# Patient Record
Sex: Male | Born: 1945 | Race: White | Hispanic: No | Marital: Married | Smoking: Current every day smoker
Health system: Southern US, Community
[De-identification: ages and names within clinical notes are randomized; demographics above are authoritative.]

## PROBLEM LIST (undated history)

## (undated) DIAGNOSIS — R918 Other nonspecific abnormal finding of lung field: Secondary | ICD-10-CM

## (undated) DIAGNOSIS — E785 Hyperlipidemia, unspecified: Secondary | ICD-10-CM

## (undated) DIAGNOSIS — R7989 Other specified abnormal findings of blood chemistry: Secondary | ICD-10-CM

## (undated) DIAGNOSIS — I739 Peripheral vascular disease, unspecified: Secondary | ICD-10-CM

## (undated) DIAGNOSIS — R0600 Dyspnea, unspecified: Secondary | ICD-10-CM

## (undated) DIAGNOSIS — R000 Tachycardia, unspecified: Secondary | ICD-10-CM

## (undated) DIAGNOSIS — I219 Acute myocardial infarction, unspecified: Secondary | ICD-10-CM

## (undated) DIAGNOSIS — I5032 Chronic diastolic (congestive) heart failure: Secondary | ICD-10-CM

## (undated) DIAGNOSIS — J449 Chronic obstructive pulmonary disease, unspecified: Secondary | ICD-10-CM

## (undated) DIAGNOSIS — R0602 Shortness of breath: Secondary | ICD-10-CM

## (undated) DIAGNOSIS — J439 Emphysema, unspecified: Secondary | ICD-10-CM

## (undated) DIAGNOSIS — D649 Anemia, unspecified: Secondary | ICD-10-CM

## (undated) DIAGNOSIS — I2510 Atherosclerotic heart disease of native coronary artery without angina pectoris: Secondary | ICD-10-CM

## (undated) DIAGNOSIS — I483 Typical atrial flutter: Secondary | ICD-10-CM

## (undated) DIAGNOSIS — R079 Chest pain, unspecified: Secondary | ICD-10-CM

## (undated) DIAGNOSIS — I10 Essential (primary) hypertension: Secondary | ICD-10-CM

## (undated) DIAGNOSIS — J9621 Acute and chronic respiratory failure with hypoxia: Secondary | ICD-10-CM

## (undated) DIAGNOSIS — I2583 Coronary atherosclerosis due to lipid rich plaque: Secondary | ICD-10-CM

## (undated) DIAGNOSIS — D509 Iron deficiency anemia, unspecified: Secondary | ICD-10-CM

## (undated) DIAGNOSIS — IMO0001 Reserved for inherently not codable concepts without codable children: Secondary | ICD-10-CM

## (undated) DIAGNOSIS — I4891 Unspecified atrial fibrillation: Secondary | ICD-10-CM

## (undated) DIAGNOSIS — J9611 Chronic respiratory failure with hypoxia: Secondary | ICD-10-CM

## (undated) DIAGNOSIS — J441 Chronic obstructive pulmonary disease with (acute) exacerbation: Secondary | ICD-10-CM

## (undated) DIAGNOSIS — J9690 Respiratory failure, unspecified, unspecified whether with hypoxia or hypercapnia: Secondary | ICD-10-CM

## (undated) DIAGNOSIS — G4734 Idiopathic sleep related nonobstructive alveolar hypoventilation: Secondary | ICD-10-CM

## (undated) HISTORY — DX: Hyperlipidemia, unspecified: E78.5

## (undated) HISTORY — PX: CHOLECYSTECTOMY: SHX55

## (undated) HISTORY — DX: Typical atrial flutter: I48.3

## (undated) HISTORY — PX: OTHER SURGICAL HISTORY: SHX169

## (undated) HISTORY — DX: Peripheral vascular disease, unspecified: I73.9

## (undated) HISTORY — DX: Chronic diastolic (congestive) heart failure: I50.32

## (undated) HISTORY — DX: Idiopathic sleep related nonobstructive alveolar hypoventilation: G47.34

## (undated) HISTORY — DX: Chronic respiratory failure with hypoxia: J96.11

## (undated) HISTORY — DX: Chest pain, unspecified: R07.9

## (undated) HISTORY — DX: Other specified abnormal findings of blood chemistry: R79.89

## (undated) HISTORY — DX: Tachycardia, unspecified: R00.0

## (undated) HISTORY — DX: Chronic obstructive pulmonary disease with (acute) exacerbation: J44.1

## (undated) HISTORY — DX: Dyspnea, unspecified: R06.00

## (undated) HISTORY — DX: Emphysema, unspecified: J43.9

## (undated) HISTORY — DX: Acute and chronic respiratory failure with hypoxia: J96.21

## (undated) HISTORY — PX: NASAL SEPTUM SURGERY: SHX37

## (undated) HISTORY — DX: Shortness of breath: R06.02

## (undated) HISTORY — DX: Coronary atherosclerosis due to lipid rich plaque: I25.83

## (undated) HISTORY — DX: Atherosclerotic heart disease of native coronary artery without angina pectoris: I25.10

## (undated) HISTORY — DX: Other nonspecific abnormal finding of lung field: R91.8

## (undated) HISTORY — DX: Respiratory failure, unspecified, unspecified whether with hypoxia or hypercapnia: J96.90

---

## 2003-10-31 ENCOUNTER — Inpatient Hospital Stay (HOSPITAL_COMMUNITY): Admission: EM | Admit: 2003-10-31 | Discharge: 2003-11-04 | Payer: Self-pay | Admitting: Emergency Medicine

## 2003-11-06 ENCOUNTER — Emergency Department (HOSPITAL_COMMUNITY): Admission: EM | Admit: 2003-11-06 | Discharge: 2003-11-06 | Payer: Self-pay | Admitting: Emergency Medicine

## 2003-11-11 ENCOUNTER — Encounter: Admission: RE | Admit: 2003-11-11 | Discharge: 2003-11-11 | Payer: Self-pay | Admitting: Family Medicine

## 2003-12-01 ENCOUNTER — Encounter: Admission: RE | Admit: 2003-12-01 | Discharge: 2003-12-01 | Payer: Self-pay | Admitting: Family Medicine

## 2003-12-17 ENCOUNTER — Encounter: Admission: RE | Admit: 2003-12-17 | Discharge: 2003-12-17 | Payer: Self-pay | Admitting: Sports Medicine

## 2003-12-28 ENCOUNTER — Encounter (HOSPITAL_COMMUNITY): Admission: RE | Admit: 2003-12-28 | Discharge: 2004-03-27 | Payer: Self-pay | Admitting: Cardiology

## 2004-03-02 ENCOUNTER — Encounter: Admission: RE | Admit: 2004-03-02 | Discharge: 2004-03-02 | Payer: Self-pay | Admitting: Cardiology

## 2004-03-21 ENCOUNTER — Ambulatory Visit: Payer: Self-pay | Admitting: Family Medicine

## 2004-03-29 ENCOUNTER — Ambulatory Visit: Payer: Self-pay | Admitting: Sports Medicine

## 2004-04-20 ENCOUNTER — Ambulatory Visit: Payer: Self-pay | Admitting: Family Medicine

## 2005-03-18 ENCOUNTER — Ambulatory Visit: Payer: Self-pay | Admitting: Internal Medicine

## 2005-03-18 ENCOUNTER — Inpatient Hospital Stay (HOSPITAL_COMMUNITY): Admission: EM | Admit: 2005-03-18 | Discharge: 2005-03-21 | Payer: Self-pay | Admitting: Emergency Medicine

## 2005-03-20 ENCOUNTER — Ambulatory Visit: Payer: Self-pay | Admitting: Cardiology

## 2005-03-23 ENCOUNTER — Inpatient Hospital Stay (HOSPITAL_COMMUNITY): Admission: EM | Admit: 2005-03-23 | Discharge: 2005-03-26 | Payer: Self-pay | Admitting: Emergency Medicine

## 2005-04-11 ENCOUNTER — Ambulatory Visit: Payer: Self-pay | Admitting: Internal Medicine

## 2006-08-16 DIAGNOSIS — I2510 Atherosclerotic heart disease of native coronary artery without angina pectoris: Secondary | ICD-10-CM

## 2006-08-16 DIAGNOSIS — I10 Essential (primary) hypertension: Secondary | ICD-10-CM

## 2006-08-16 DIAGNOSIS — E119 Type 2 diabetes mellitus without complications: Secondary | ICD-10-CM | POA: Insufficient documentation

## 2006-08-16 DIAGNOSIS — E785 Hyperlipidemia, unspecified: Secondary | ICD-10-CM

## 2006-08-16 DIAGNOSIS — E781 Pure hyperglyceridemia: Secondary | ICD-10-CM | POA: Insufficient documentation

## 2006-08-16 DIAGNOSIS — J439 Emphysema, unspecified: Secondary | ICD-10-CM

## 2006-08-16 DIAGNOSIS — F17200 Nicotine dependence, unspecified, uncomplicated: Secondary | ICD-10-CM | POA: Insufficient documentation

## 2012-08-08 ENCOUNTER — Other Ambulatory Visit: Payer: Self-pay

## 2012-08-08 ENCOUNTER — Encounter (HOSPITAL_COMMUNITY): Payer: Self-pay

## 2012-08-08 ENCOUNTER — Emergency Department (HOSPITAL_COMMUNITY): Payer: Medicare Other

## 2012-08-08 ENCOUNTER — Emergency Department (HOSPITAL_COMMUNITY)
Admission: EM | Admit: 2012-08-08 | Discharge: 2012-08-08 | Disposition: A | Discharge status: Home / Self Care | Payer: Medicare Other | Attending: Emergency Medicine | Admitting: Emergency Medicine

## 2012-08-08 DIAGNOSIS — J449 Chronic obstructive pulmonary disease, unspecified: Secondary | ICD-10-CM

## 2012-08-08 DIAGNOSIS — Z79899 Other long term (current) drug therapy: Secondary | ICD-10-CM | POA: Insufficient documentation

## 2012-08-08 DIAGNOSIS — I10 Essential (primary) hypertension: Secondary | ICD-10-CM | POA: Insufficient documentation

## 2012-08-08 DIAGNOSIS — R51 Headache: Secondary | ICD-10-CM | POA: Insufficient documentation

## 2012-08-08 DIAGNOSIS — I509 Heart failure, unspecified: Secondary | ICD-10-CM | POA: Insufficient documentation

## 2012-08-08 DIAGNOSIS — I252 Old myocardial infarction: Secondary | ICD-10-CM | POA: Insufficient documentation

## 2012-08-08 DIAGNOSIS — R42 Dizziness and giddiness: Secondary | ICD-10-CM | POA: Insufficient documentation

## 2012-08-08 DIAGNOSIS — Z7982 Long term (current) use of aspirin: Secondary | ICD-10-CM | POA: Insufficient documentation

## 2012-08-08 DIAGNOSIS — H539 Unspecified visual disturbance: Secondary | ICD-10-CM | POA: Insufficient documentation

## 2012-08-08 DIAGNOSIS — R0602 Shortness of breath: Secondary | ICD-10-CM | POA: Insufficient documentation

## 2012-08-08 DIAGNOSIS — I160 Hypertensive urgency: Secondary | ICD-10-CM

## 2012-08-08 DIAGNOSIS — J4489 Other specified chronic obstructive pulmonary disease: Secondary | ICD-10-CM | POA: Insufficient documentation

## 2012-08-08 HISTORY — DX: Chronic obstructive pulmonary disease, unspecified: J44.9

## 2012-08-08 HISTORY — DX: Essential (primary) hypertension: I10

## 2012-08-08 HISTORY — DX: Acute myocardial infarction, unspecified: I21.9

## 2012-08-08 LAB — CBC WITH DIFFERENTIAL/PLATELET
Basophils Absolute: 0 10*3/uL (ref 0.0–0.1)
Basophils Relative: 1 % (ref 0–1)
Eosinophils Absolute: 0.1 10*3/uL (ref 0.0–0.7)
Eosinophils Relative: 1 % (ref 0–5)
HCT: 42.5 % (ref 39.0–52.0)
Hemoglobin: 15 g/dL (ref 13.0–17.0)
Lymphocytes Relative: 19 % (ref 12–46)
Lymphs Abs: 1.2 10*3/uL (ref 0.7–4.0)
MCH: 30.2 pg (ref 26.0–34.0)
MCHC: 35.3 g/dL (ref 30.0–36.0)
MCV: 85.7 fL (ref 78.0–100.0)
Monocytes Absolute: 0.5 10*3/uL (ref 0.1–1.0)
Monocytes Relative: 7 % (ref 3–12)
Neutro Abs: 4.7 10*3/uL (ref 1.7–7.7)
Neutrophils Relative %: 72 % (ref 43–77)
Platelets: 171 10*3/uL (ref 150–400)
RBC: 4.96 MIL/uL (ref 4.22–5.81)
RDW: 12.9 % (ref 11.5–15.5)
WBC: 6.5 10*3/uL (ref 4.0–10.5)

## 2012-08-08 LAB — URINALYSIS, ROUTINE W REFLEX MICROSCOPIC
Bilirubin Urine: NEGATIVE
Glucose, UA: NEGATIVE mg/dL
Hgb urine dipstick: NEGATIVE
Ketones, ur: NEGATIVE mg/dL
Nitrite: NEGATIVE
Protein, ur: NEGATIVE mg/dL
Specific Gravity, Urine: 1.005 (ref 1.005–1.030)
Urobilinogen, UA: 0.2 mg/dL (ref 0.0–1.0)
pH: 7 (ref 5.0–8.0)

## 2012-08-08 LAB — BASIC METABOLIC PANEL
BUN: 9 mg/dL (ref 6–23)
CO2: 29 mEq/L (ref 19–32)
Calcium: 9.3 mg/dL (ref 8.4–10.5)
Chloride: 99 mEq/L (ref 96–112)
Creatinine, Ser: 0.89 mg/dL (ref 0.50–1.35)
GFR calc Af Amer: >90 mL/min (ref >90)
GFR calc non Af Amer: 86 mL/min — ABNORMAL LOW (ref >90)
Glucose, Bld: 144 mg/dL — ABNORMAL HIGH (ref 70–99)
Potassium: 3.6 mEq/L (ref 3.5–5.1)
Sodium: 139 mEq/L (ref 135–145)

## 2012-08-08 LAB — TROPONIN I
Troponin I: <0.3 ng/mL (ref <0.30)
Troponin I: <0.3 ng/mL (ref <0.30)

## 2012-08-08 LAB — URINE MICROSCOPIC-ADD ON

## 2012-08-08 MED ORDER — NITROGLYCERIN 0.4 MG SL SUBL
0.4000 mg | SUBLINGUAL_TABLET | SUBLINGUAL | Status: DC | PRN
  Administered 2012-08-08 (×2): 0.4 mg via SUBLINGUAL
  Filled 2012-08-08: qty 25

## 2012-08-08 MED ORDER — ALBUTEROL SULFATE (5 MG/ML) 0.5% IN NEBU
2.5000 mg | INHALATION_SOLUTION | Freq: Once | RESPIRATORY_TRACT | Status: AC
  Administered 2012-08-08: 2.5 mg via RESPIRATORY_TRACT
  Filled 2012-08-08: qty 0.5

## 2012-08-08 MED ORDER — ALBUTEROL (5 MG/ML) CONTINUOUS INHALATION SOLN
10.0000 mg/h | INHALATION_SOLUTION | Freq: Once | RESPIRATORY_TRACT | Status: AC
  Administered 2012-08-08: 10 mg/h via RESPIRATORY_TRACT
  Filled 2012-08-08: qty 20

## 2012-08-08 MED ORDER — IPRATROPIUM BROMIDE 0.02 % IN SOLN
0.5000 mg | Freq: Once | RESPIRATORY_TRACT | Status: AC
  Administered 2012-08-08: 0.5 mg via RESPIRATORY_TRACT
  Filled 2012-08-08: qty 2.5

## 2012-08-08 NOTE — ED Notes (Signed)
Pt states he was feeling hot, dizzy, and had headache. Pt states he checked bp and it was very high. Pt shaking in bed, tearful, reluctant to answer questions. Pt wife states pt has high level of stress due to having a CT scan scheduled for tomorrow to look at "nodules in his lungs." Pt states en route to hospital he began having left arm pain "feels like it's going to cramp." NSR on monitor.

## 2012-08-08 NOTE — ED Notes (Signed)
Per ems- pt c/o hypertension. Pt took it at home, was 230/100. With ems BP-191/138. Pt states he was feeling "hot and dizzy." Denies n/v, chest pain. C/o general weakness. 20g IV in LAC. NSR on 12lead. HR95 O2-98%

## 2012-08-08 NOTE — ED Notes (Signed)
SPO2 while ambulating was stable, 95-100%

## 2012-08-08 NOTE — ED Notes (Signed)
Pt states "I feel a lot better than I did when I first came in."

## 2012-08-08 NOTE — ED Provider Notes (Addendum)
History     CSN: [id number]  Arrival date & time 08/08/12  1621   First MD Initiated Contact with Patient 08/08/12 1630      Chief Complaint  Patient presents with  . Hypertension    (Consider location/radiation/quality/duration/timing/severity/associated sxs/prior treatment) HPI Comments: Pt comes in with cc of HTN. Hx of HTN, CAD with negative stress within the last year and COPD with PTX. Pt reports that he has been having slightly elevated HTN over the past few days - but today, the pressure has been in the 170s systolic. He also started noticing in the afternoon, that he started having some headaches - frontal, sharp, constant, associated with some blurry vision. He has some DIB with exertion. No chest pain, no n/v/f/c. Pt has been taking his meds as prescribed. He is noted to be tachypneic - and has hx of COPD, but reports no wheezing, no new cough.   Patient is a 67 y.o. male presenting with hypertension. The history is provided by the patient.  Hypertension Associated symptoms include headaches and shortness of breath. Pertinent negatives include no chest pain.    Past Medical History  Diagnosis Date  . Hypertension   . COPD (chronic obstructive pulmonary disease)   . CHF (congestive heart failure)   . MI (myocardial infarction)     No past surgical history on file.  No family history on file.  History  Substance Use Topics  . Smoking status: Not on file  . Smokeless tobacco: Not on file  . Alcohol Use: Not on file      Review of Systems  Constitutional: Negative for fever, chills and activity change.  HENT: Negative for neck pain.   Eyes: Positive for visual disturbance.  Respiratory: Positive for shortness of breath. Negative for cough and chest tightness.   Cardiovascular: Negative for chest pain.  Gastrointestinal: Negative for abdominal distention.  Genitourinary: Negative for dysuria, enuresis and difficulty urinating.  Musculoskeletal: Negative for  arthralgias.  Neurological: Positive for dizziness, light-headedness and headaches.  Psychiatric/Behavioral: Negative for confusion.    Allergies  Review of patient's allergies indicates no known allergies.  Home Medications   Current Outpatient Rx  Name  Route  Sig  Dispense  Refill  . amLODipine (NORVASC) 10 MG tablet   Oral   Take 5-10 mg by mouth See admin instructions. Take 5mg  daily. If blood pressure is still increased after 1 week, then increase to 10mg  daily.         Marland Kitchen aspirin EC 81 MG tablet   Oral   Take 81 mg by mouth daily.         . budesonide-formoterol (SYMBICORT) 160-4.5 MCG/ACT inhaler   Inhalation   Inhale 2 puffs into the lungs 2 (two) times daily.         . clopidogrel (PLAVIX) 75 MG tablet   Oral   Take 75 mg by mouth daily.         Marland Kitchen lisinopril (PRINIVIL,ZESTRIL) 40 MG tablet   Oral   Take 40 mg by mouth daily.         . nitroGLYCERIN (NITROSTAT) 0.4 MG SL tablet   Sublingual   Place 0.4 mg under the tongue every 5 (five) minutes as needed for chest pain. x3 doses as needed for chest pain         . rosuvastatin (CRESTOR) 20 MG tablet   Oral   Take 20 mg by mouth at bedtime.         Marland Kitchen tiotropium (  SPIRIVA) 18 MCG inhalation capsule   Inhalation   Place 18 mcg into inhaler and inhale daily.           BP 174/101  Pulse 100  Temp(Src) 97.9 F (36.6 C) (Oral)  Resp 18  SpO2 100%  Physical Exam  Nursing note and vitals reviewed. Constitutional: He is oriented to person, place, and time. He appears well-developed.  HENT:  Head: Normocephalic and atraumatic.  No retinal hemorrhage appreciated.  Eyes: Conjunctivae and EOM are normal. Pupils are equal, round, and reactive to light.  Neck: Normal range of motion. Neck supple. No JVD present.  Cardiovascular: Normal rate and regular rhythm.  Exam reveals no gallop.   Murmur heard. Pulmonary/Chest: Effort normal and breath sounds normal. No respiratory distress. He has no  wheezes. He has no rales.  Bilateral tightness on chest auscultation   Abdominal: Soft. Bowel sounds are normal. He exhibits no distension. There is no tenderness. There is no rebound and no guarding.  Neurological: He is alert and oriented to person, place, and time. No cranial nerve deficit. Coordination normal.  Skin: Skin is warm.    ED Course  Procedures (including critical care time)  Labs Reviewed  CBC WITH DIFFERENTIAL  BASIC METABOLIC PANEL  TROPONIN I  URINALYSIS, ROUTINE W REFLEX MICROSCOPIC   No results found.   No diagnosis found.    MDM   Date: 08/08/2012  Rate: 104  Rhythm: sinus tachycardia  QRS Axis: normal  Intervals: normal  ST/T Wave abnormalities: nonspecific ST/T changes  Conduction Disutrbances:none  Narrative Interpretation:   Old EKG Reviewed: none available  Differential diagnosis includes: ACS syndrome CHF exacerbation Valvular disorder Myocarditis Pericarditis Pericardial effusion Pneumonia Pleural effusion Pulmonary edema PE Anemia HTN emergency  Pt comes in with cc of HTN. Pt is having some headaches and blurry vision - so we will get CT head. His BP was 200/110 when i stepped in the room - however, serial checks while i was in the room gave reading of 160s/100s. Pt's hx and exam otherwise not suggestive of specific end organ damage. Will get basic labs and reassess.     Derwood Kaplan, MD 08/08/12 1739 9:20 PM Ambulatory pulsox looks great. BP has normalized. Will discharge now. He is to see Dr. Cyndia Bent tomorrow. No steroids - i dont think he has COPD exacerbation either.    Derwood Kaplan, MD 08/08/12 2120  The patient was counseled on the dangers of tobacco use, and was advised to quit.  Reviewed strategies to maximize success, including removing cigarettes and smoking materials from environment.  Derwood Kaplan, MD 08/08/12 2122

## 2012-11-13 ENCOUNTER — Telehealth (HOSPITAL_COMMUNITY): Payer: Self-pay | Admitting: *Deleted

## 2012-11-13 NOTE — Telephone Encounter (Signed)
Call placed to patient regarding referral to Pulmonary Rehab.  No answer or answering machine.  Letter mailed to patient asking for follow up call. Cathie Olden RN

## 2014-08-10 ENCOUNTER — Encounter: Payer: Self-pay | Admitting: *Deleted

## 2014-08-10 ENCOUNTER — Ambulatory Visit (INDEPENDENT_AMBULATORY_CARE_PROVIDER_SITE_OTHER): Payer: Medicare Other | Admitting: Pulmonary Disease

## 2014-08-10 VITALS — BP 130/68 | HR 73 | Temp 97.9°F | Ht 72.0 in | Wt 158.6 lb

## 2014-08-10 DIAGNOSIS — R918 Other nonspecific abnormal finding of lung field: Secondary | ICD-10-CM | POA: Diagnosis not present

## 2014-08-10 DIAGNOSIS — J438 Other emphysema: Secondary | ICD-10-CM | POA: Diagnosis not present

## 2014-08-10 NOTE — Assessment & Plan Note (Signed)
The patient carries a diagnosis of COPD, and given his significant emphysematous changes on his CT chest from 2006 and his decreased breath sounds on exam, I suspect this is accurate. He did have recent spirometry that showed poor technique and effort, and therefore unreliable results. He tells me that he has never had full formal PFTs, and I will schedule him for these to better establish his baseline. He is on a very good maintenance bronchodilator regimen, but I have asked him to increase his tudorza to the therapeutic dose of twice a day. I have also stressed to him the importance of total smoking cessation. Finally, the patient is having a barking cough which really sounds more upper airway in origin. He is currently on an ACE inhibitor, and I think he needs to come off of this for at least 8 weeks to see if his symptoms improve.

## 2014-08-10 NOTE — Progress Notes (Signed)
   Subjective:    Patient ID: John Marsh, male    DOB: 1945-07-19, 69 y.o.   MRN: [id number]  HPI The patient is a 69 year old male who I've been asked to see for management of COPD. The patient has had breathing issues for at least 5 years, and feels they have been progressive over the last one year. He develops dyspnea on exertion with walking a distance of one block or less at a moderate pace, and will also get winded walking up a flight of stairs or bringing groceries in from the car. He also notes a significant cough that comes and goes, and describes a tickle in his throat that leads to the cough. Typically he does not bring up very much mucus.  The patient has a history of smoking one pack per day for 55 years, and tells me he has been diagnosed with COPD by breathing studies. However, he has never had full PFTs according to his history. He has had recent spirometry that showed a very poor effort and technique, with invalid results.  The patient has been maintained on Symbicort, and recently changed Spiriva to tudorza because of cough. He is only using the [place] once a day. He also has a history of coronary disease with stents, but has not had chronic lower extremity edema. Finally, the patient also has a history of pulmonary nodules, but his recent scans are not available for my review. He had a biopsy in 2013 of a left upper lobe nodule which showed changes of old granulomatous disease. 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 He also had a pneumothorax at the time of the biopsy which required tube thoracostomy.   Review of Systems  Constitutional: Negative for fever and unexpected weight change.  HENT: Positive for congestion and postnasal drip. Negative for dental problem, ear pain, nosebleeds, rhinorrhea, sinus pressure, sneezing, sore throat and trouble swallowing.   Eyes: Negative for redness and itching.  Respiratory: Positive for cough, shortness of  breath and wheezing. Negative for chest tightness.   Cardiovascular: Negative for palpitations and leg swelling.  Gastrointestinal: Negative for nausea and vomiting.  Genitourinary: Negative for dysuria.  Musculoskeletal: Negative for joint swelling.  Skin: Negative for rash.  Neurological: Negative for headaches.  Hematological: Does not bruise/bleed easily.  Psychiatric/Behavioral: Negative for dysphoric mood. The patient is not nervous/anxious.        Objective:   Physical Exam Constitutional:  Well developed, no acute distress  HENT:  Nares patent without discharge  Oropharynx without exudate, palate and uvula are normal  Eyes:  Perrla, eomi, no scleral icterus  Neck:  No JVD, no TMG  Cardiovascular:  Normal rate, regular rhythm, no rubs or gallops.  No murmurs        Intact distal pulses  Pulmonary :  Decreased breath sounds, no stridor or respiratory distress   No rales, rhonchi, or wheezing  Abdominal:  Soft, nondistended, bowel sounds present.  No tenderness noted.   Musculoskeletal:  minimal lower extremity edema noted.  Lymph Nodes:  No cervical lymphadenopathy noted  Skin:  No cyanosis noted  Neurologic:  Alert, appropriate, moves all 4 extremities without obvious deficit.         Assessment & Plan:

## 2014-08-10 NOTE — Assessment & Plan Note (Signed)
The patient has a history of pulmonary nodules, with a biopsy of a left upper lobe nodule in 2013 showing inflammatory cells and multinucleated giant cells. 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 I do not have his more recent CT chest to review, but the patient will bring a disc of his scans for me to look at.

## 2014-08-10 NOTE — Patient Instructions (Signed)
Continue on your symbicort twice a day everyday, and you need to take tudorza one inhalation TWICE a day as well. Will check oxygen level today with exertion, and will also check overnight while sleeping.  Will call you with results Will make a recommendation to your primary doctor to come off lisinopril for about 8 weeks to see if helps your barking cough. Work on total smoking cessation as we discussed. Please get disk of your chest CT's for my review, and bring to next visit. Will schedule for breathing tests, and see you back on the same day for review with you.  Will try to get in next few weeks.

## 2014-08-14 ENCOUNTER — Telehealth: Payer: Self-pay | Admitting: Pulmonary Disease

## 2014-08-14 NOTE — Telephone Encounter (Signed)
Called and spoke to ChenowethMandy. Mandy stated pt's ONO was only for 4 hours but still qualifies with the 4 hours of data. Pt will not be charged for ONO if an additional test is needed. Mandy questions [facility] entire 8 hour data from ONO.   [facility] please advise.

## 2014-08-14 NOTE — Telephone Encounter (Signed)
I have not seen his ONO.  Please get for my review.

## 2014-08-17 NOTE — Telephone Encounter (Signed)
Called and spoke to ImlayMandy. ONO is to be faxed to triage fax.   Will forward to Mindy to look out for.

## 2014-08-17 NOTE — Telephone Encounter (Signed)
[facility] received and [facility] Please advise thanks

## 2014-08-18 NOTE — Telephone Encounter (Signed)
This should be repeated, and stress to pt he needs to wear at least 6 hrs during the night.  He dropped his oxygen level, but for a minimal amt of time.

## 2014-08-18 NOTE — Telephone Encounter (Signed)
Pt is aware that he will have to repeat ONO and to wear at least 6 hours during the night. I contacted Angelica ChessmanMandy and advised her ONO was needed and she states that another order did not have to be placed. Nothing further was needed.

## 2014-09-14 ENCOUNTER — Ambulatory Visit (INDEPENDENT_AMBULATORY_CARE_PROVIDER_SITE_OTHER): Payer: Medicare Other | Admitting: Pulmonary Disease

## 2014-09-14 ENCOUNTER — Encounter: Payer: Self-pay | Admitting: Pulmonary Disease

## 2014-09-14 VITALS — BP 136/70 | HR 71 | Temp 97.0°F | Ht 72.5 in | Wt 162.8 lb

## 2014-09-14 DIAGNOSIS — J438 Other emphysema: Secondary | ICD-10-CM | POA: Diagnosis not present

## 2014-09-14 DIAGNOSIS — R918 Other nonspecific abnormal finding of lung field: Secondary | ICD-10-CM | POA: Diagnosis not present

## 2014-09-14 LAB — PULMONARY FUNCTION TEST
DL/VA % pred: 43 %
DL/VA: 2.05 ml/min/mmHg/L
DLCO unc % pred: 36 %
DLCO unc: 13.07 ml/min/mmHg
FEF 25-75 Post: 0.58 L/sec
FEF 25-75 Pre: 0.33 L/sec
FEF2575-%CHANGE-POST: 78 %
FEF2575-%PRED-PRE: 12 %
FEF2575-%Pred-Post: 21 %
FEV1-%CHANGE-POST: 9 %
FEV1-%PRED-POST: 35 %
FEV1-%Pred-Pre: 32 %
FEV1-POST: 1.25 L
FEV1-PRE: 1.15 L
FEV1FVC-%CHANGE-POST: -6 %
FEV1FVC-%Pred-Pre: 61 %
FEV6-%Change-Post: 16 %
FEV6-%Pred-Post: 59 %
FEV6-%Pred-Pre: 51 %
FEV6-Post: 2.73 L
FEV6-Pre: 2.35 L
FEV6FVC-%Change-Post: 0 %
FEV6FVC-%PRED-POST: 98 %
FEV6FVC-%PRED-PRE: 98 %
FVC-%CHANGE-POST: 16 %
FVC-%PRED-PRE: 51 %
FVC-%Pred-Post: 60 %
FVC-Post: 2.91 L
FVC-Pre: 2.5 L
POST FEV1/FVC RATIO: 43 %
POST FEV6/FVC RATIO: 94 %
PRE FEV6/FVC RATIO: 94 %
Pre FEV1/FVC ratio: 46 %
RV % PRED: 236 %
RV: 6.12 L
TLC % pred: 120 %
TLC: 9.1 L

## 2014-09-14 NOTE — Patient Instructions (Signed)
No change in your breathing medications Will refer to pulmonary rehab program at [facility], and you can decide about participating. You have to stop smoking Will compare your scan from 2013 to your priors.  We can then discuss a followup exam in the future. followup with me again in 4mos, but call if having issues.

## 2014-09-14 NOTE — Progress Notes (Signed)
   Subjective:    Patient ID: John Marsh, male    DOB: Apr 05, 1946, 69 y.o.   MRN: [id number]  HPI The patient comes in today for follow-up of his recent pulmonary function studies. Found to have severe airflow obstruction with significant air trapping and a severe reduction in his diffusion capacity. I have reviewed the study with him in detail, and answered all of his questions. He is also had overnight oximetry that did not show any significant desaturation that would require oxygen at this time. Unfortunately, the patient continues to smoke.   Review of Systems  Constitutional: Negative for fever and unexpected weight change.  HENT: Negative for congestion, dental problem, ear pain, nosebleeds, postnasal drip, rhinorrhea, sinus pressure, sneezing, sore throat and trouble swallowing.   Eyes: Negative for redness and itching.  Respiratory: Positive for cough and shortness of breath. Negative for chest tightness and wheezing.   Cardiovascular: Negative for palpitations and leg swelling.  Gastrointestinal: Negative for nausea and vomiting.  Genitourinary: Negative for dysuria.  Musculoskeletal: Negative for joint swelling.  Skin: Negative for rash.  Neurological: Negative for headaches.  Hematological: Does not bruise/bleed easily.  Psychiatric/Behavioral: Negative for dysphoric mood. The patient is not nervous/anxious.        Objective:   Physical Exam Thin male in no acute distress Nose without purulence or discharge noted Neck without lymphadenopathy or thyromegaly Chest with decreased breath sounds, no wheezing Lower extremities without edema, no cyanosis Alert and oriented, moves all 4 extremities.        Assessment & Plan:

## 2014-09-14 NOTE — Assessment & Plan Note (Signed)
The patient has severe airflow obstruction based on his pulmonary function studies, but actually has a much better exertional tolerance than his studies would normally indicate. He is on a very good bronchodilator regimen, and I really have nothing else to add. His overnight oximetry does not show a need for oxygen, and he does not desaturate significantly with exertion. The most important thing he can do at this point is total smoking cessation, and he tells me that he will try and work on this. I have recommended that he consider pulmonary rehabilitation, and he is willing to visit the program and [place] to see if he may benefit.

## 2014-09-14 NOTE — Progress Notes (Signed)
PFT done today. 

## 2014-09-14 NOTE — Assessment & Plan Note (Signed)
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 I will compare this to his scan from 2006 and see if there is any significant change.

## 2014-09-15 ENCOUNTER — Telehealth: Payer: Self-pay | Admitting: Pulmonary Disease

## 2014-09-15 DIAGNOSIS — R911 Solitary pulmonary nodule: Secondary | ICD-10-CM

## 2014-09-15 NOTE — Telephone Encounter (Signed)
Mindy, please call pt and let him know that I have compared his ct chest from 2013 with that from 9 yrs ago.  It appears he has a spot in top of left lung that was not present then, and it is different from his other spots that [place]. He needs ct chest to followup.  If ok with this, put in for noncontrast ct to evaluate LUL nodule

## 2014-09-15 NOTE — Telephone Encounter (Signed)
Called pt and is aware. Order placed for CT scan. nothing further needed

## 2014-09-16 ENCOUNTER — Telehealth: Payer: Self-pay

## 2014-09-16 NOTE — Telephone Encounter (Signed)
09/16/14 Disc [facility] from [facility] and filed on shelf.[name]/IH

## 2014-09-17 ENCOUNTER — Inpatient Hospital Stay: Admission: RE | Admit: 2014-09-17 | Payer: Medicare Other | Source: Ambulatory Visit

## 2014-09-17 ENCOUNTER — Ambulatory Visit (INDEPENDENT_AMBULATORY_CARE_PROVIDER_SITE_OTHER)
Admission: RE | Admit: 2014-09-17 | Discharge: 2014-09-17 | Disposition: A | Discharge status: Home / Self Care | Payer: Medicare Other | Source: Ambulatory Visit | Attending: Pulmonary Disease | Admitting: Pulmonary Disease

## 2014-09-17 ENCOUNTER — Encounter (HOSPITAL_COMMUNITY)
Admission: RE | Admit: 2014-09-17 | Discharge: 2014-09-17 | Disposition: A | Discharge status: Home / Self Care | Payer: Medicare Other | Source: Ambulatory Visit | Attending: Pulmonary Disease | Admitting: Pulmonary Disease

## 2014-09-17 VITALS — BP 112/62 | HR 86 | Ht 72.5 in | Wt 160.1 lb

## 2014-09-17 DIAGNOSIS — R911 Solitary pulmonary nodule: Secondary | ICD-10-CM

## 2014-09-17 DIAGNOSIS — J438 Other emphysema: Secondary | ICD-10-CM | POA: Diagnosis not present

## 2014-09-17 NOTE — Progress Notes (Signed)
Patient arrived at 14:30. Patient was referred by Dr. Marcelyn BruinsKeith Clance due to Emphysema J43.8. During orientation advised patient on arrival and appointment times what to wear, what to do before, during and after exercise. Reviewed attendance and class policy. Discussed how to practice pursed lip breathing. Measured patient for the equipment and discussed equipment safety. Talked about inclement weather and class consultation policy. Pt is scheduled to start Pulmonary Rehab on 09/22/14 at 10:45 am.  Pt was advised to come to class 15 minutes before class starts. He was also given instructions on meeting with the dietician and attending the Family Structure class on 09/24/14.  Patient was able to do the 6 minute walk test. Lasted 5 minutes 30 seconds w/o oxygen. Had to have assistance of pushing wheelchair to aid in breathing. Patient commented that pushing the w/c helped. Handouts give to practice pursed lip breathing at home. Pt is eager to get started. Patient finished at 16:30.

## 2014-09-17 NOTE — Patient Instructions (Signed)
Orientation/education completed. Pt is scheduled to start on Tuesday 09/22/14 at 10:45 am. Pt is registered and records are requested. Pt is eager to get started.

## 2014-09-22 ENCOUNTER — Encounter (HOSPITAL_COMMUNITY)
Admission: RE | Admit: 2014-09-22 | Discharge: 2014-09-22 | Disposition: A | Discharge status: Home / Self Care | Payer: Medicare Other | Source: Ambulatory Visit | Attending: Pulmonary Disease | Admitting: Pulmonary Disease

## 2014-09-22 DIAGNOSIS — J438 Other emphysema: Secondary | ICD-10-CM | POA: Insufficient documentation

## 2014-09-23 ENCOUNTER — Other Ambulatory Visit: Payer: Self-pay | Admitting: Pulmonary Disease

## 2014-09-23 DIAGNOSIS — R918 Other nonspecific abnormal finding of lung field: Secondary | ICD-10-CM

## 2014-09-24 ENCOUNTER — Encounter (HOSPITAL_COMMUNITY)
Admission: RE | Admit: 2014-09-24 | Discharge: 2014-09-24 | Disposition: A | Discharge status: Home / Self Care | Payer: Medicare Other | Source: Ambulatory Visit | Attending: Pulmonary Disease | Admitting: Pulmonary Disease

## 2014-09-24 DIAGNOSIS — J438 Other emphysema: Secondary | ICD-10-CM | POA: Diagnosis not present

## 2014-09-29 ENCOUNTER — Encounter (HOSPITAL_COMMUNITY)
Admission: RE | Admit: 2014-09-29 | Discharge: 2014-09-29 | Disposition: A | Discharge status: Home / Self Care | Payer: Medicare Other | Source: Ambulatory Visit | Attending: Pulmonary Disease | Admitting: Pulmonary Disease

## 2014-09-29 DIAGNOSIS — J438 Other emphysema: Secondary | ICD-10-CM | POA: Diagnosis not present

## 2014-10-01 ENCOUNTER — Encounter (HOSPITAL_COMMUNITY)
Admission: RE | Admit: 2014-10-01 | Discharge: 2014-10-01 | Disposition: A | Discharge status: Home / Self Care | Payer: Medicare Other | Source: Ambulatory Visit | Attending: Pulmonary Disease | Admitting: Pulmonary Disease

## 2014-10-01 DIAGNOSIS — J438 Other emphysema: Secondary | ICD-10-CM | POA: Diagnosis not present

## 2014-10-01 NOTE — Progress Notes (Signed)
Pulmonary Rehabilitation Program Outcomes Report   Orientation:  09/17/14 Graduate Date:  tbd Discharge Date:  tbd # of sessions completed: 2  Pulmonologist: Dr. Shelle Ironlance Family MD:  Dr. Maxcine HamBadger Class Time:  1045  A.  Exercise Program:  Tolerates exercise @ 3.48 METS for 30 minutes  B.  Mental Health:  Good mental attitude  C.  Education/Instruction/Skills  Accurately checks own pulse.  Rest:  84  Exercise:  104, Knows THR for exercise and Uses Perceived Exertion Scale and/or Dyspnea Scale  Demonstrates accurate pursed lip breathing  D.  Nutrition/Weight Control/Body Composition:  Adherence to prescribed nutrition program: good    E.  Blood Lipids   No results found for: CHOL, HDL, LDLCALC, LDLDIRECT, TRIG, CHOLHDL  F.  Lifestyle Changes:  Making positive lifestyle changes and Continues to smoke  G.  Symptoms noted with exercise:  Asymptomatic  Report Completed By:  Santiago BurElizabeth Taggart Prasad, RN    Comments:  First week note.  Pt reports is still smoking.  Offered smoking cessation and states, "I will think about it."  Encouraged pt to participate.

## 2014-10-02 ENCOUNTER — Encounter: Payer: Self-pay | Admitting: Pulmonary Disease

## 2014-10-06 ENCOUNTER — Encounter (HOSPITAL_COMMUNITY)
Admission: RE | Admit: 2014-10-06 | Discharge: 2014-10-06 | Disposition: A | Discharge status: Home / Self Care | Payer: Medicare Other | Source: Ambulatory Visit | Attending: Pulmonary Disease | Admitting: Pulmonary Disease

## 2014-10-06 DIAGNOSIS — J438 Other emphysema: Secondary | ICD-10-CM | POA: Diagnosis not present

## 2014-10-08 ENCOUNTER — Encounter (HOSPITAL_COMMUNITY)
Admission: RE | Admit: 2014-10-08 | Discharge: 2014-10-08 | Disposition: A | Discharge status: Home / Self Care | Payer: Medicare Other | Source: Ambulatory Visit | Attending: Pulmonary Disease | Admitting: Pulmonary Disease

## 2014-10-08 DIAGNOSIS — J438 Other emphysema: Secondary | ICD-10-CM | POA: Diagnosis not present

## 2014-10-13 ENCOUNTER — Encounter (HOSPITAL_COMMUNITY)
Admission: RE | Admit: 2014-10-13 | Discharge: 2014-10-13 | Disposition: A | Discharge status: Home / Self Care | Payer: Medicare Other | Source: Ambulatory Visit | Attending: Pulmonary Disease | Admitting: Pulmonary Disease

## 2014-10-13 DIAGNOSIS — J438 Other emphysema: Secondary | ICD-10-CM | POA: Diagnosis not present

## 2014-10-15 ENCOUNTER — Encounter (HOSPITAL_COMMUNITY)
Admission: RE | Admit: 2014-10-15 | Discharge: 2014-10-15 | Disposition: A | Discharge status: Home / Self Care | Payer: Medicare Other | Source: Ambulatory Visit | Attending: Pulmonary Disease | Admitting: Pulmonary Disease

## 2014-10-15 DIAGNOSIS — J438 Other emphysema: Secondary | ICD-10-CM | POA: Diagnosis not present

## 2014-10-20 ENCOUNTER — Encounter (HOSPITAL_COMMUNITY)
Admission: RE | Admit: 2014-10-20 | Discharge: 2014-10-20 | Disposition: A | Discharge status: Home / Self Care | Payer: Medicare Other | Source: Ambulatory Visit | Attending: Pulmonary Disease | Admitting: Pulmonary Disease

## 2014-10-20 DIAGNOSIS — J438 Other emphysema: Secondary | ICD-10-CM | POA: Diagnosis not present

## 2014-10-22 ENCOUNTER — Encounter (HOSPITAL_COMMUNITY)
Admission: RE | Admit: 2014-10-22 | Discharge: 2014-10-22 | Disposition: A | Discharge status: Home / Self Care | Payer: Medicare Other | Source: Ambulatory Visit | Attending: Pulmonary Disease | Admitting: Pulmonary Disease

## 2014-10-22 DIAGNOSIS — J438 Other emphysema: Secondary | ICD-10-CM | POA: Diagnosis not present

## 2014-10-27 ENCOUNTER — Encounter (HOSPITAL_COMMUNITY)
Admission: RE | Admit: 2014-10-27 | Discharge: 2014-10-27 | Disposition: A | Discharge status: Home / Self Care | Payer: Medicare Other | Source: Ambulatory Visit | Attending: Pulmonary Disease | Admitting: Pulmonary Disease

## 2014-10-27 DIAGNOSIS — J438 Other emphysema: Secondary | ICD-10-CM | POA: Diagnosis not present

## 2014-10-29 ENCOUNTER — Encounter (HOSPITAL_COMMUNITY)
Admission: RE | Admit: 2014-10-29 | Discharge: 2014-10-29 | Disposition: A | Discharge status: Home / Self Care | Payer: Medicare Other | Source: Ambulatory Visit | Attending: Pulmonary Disease | Admitting: Pulmonary Disease

## 2014-10-29 DIAGNOSIS — J438 Other emphysema: Secondary | ICD-10-CM | POA: Diagnosis not present

## 2014-11-03 ENCOUNTER — Encounter (HOSPITAL_COMMUNITY)
Admission: RE | Admit: 2014-11-03 | Discharge: 2014-11-03 | Disposition: A | Discharge status: Home / Self Care | Payer: Medicare Other | Source: Ambulatory Visit | Attending: Pulmonary Disease | Admitting: Pulmonary Disease

## 2014-11-03 DIAGNOSIS — J438 Other emphysema: Secondary | ICD-10-CM | POA: Diagnosis not present

## 2014-11-05 ENCOUNTER — Encounter (HOSPITAL_COMMUNITY)
Admission: RE | Admit: 2014-11-05 | Discharge: 2014-11-05 | Disposition: A | Discharge status: Home / Self Care | Payer: Medicare Other | Source: Ambulatory Visit | Attending: Pulmonary Disease | Admitting: Pulmonary Disease

## 2014-11-05 DIAGNOSIS — J438 Other emphysema: Secondary | ICD-10-CM | POA: Diagnosis not present

## 2014-11-10 ENCOUNTER — Encounter (HOSPITAL_COMMUNITY)
Admission: RE | Admit: 2014-11-10 | Discharge: 2014-11-10 | Disposition: A | Discharge status: Home / Self Care | Payer: Medicare Other | Source: Ambulatory Visit | Attending: Pulmonary Disease | Admitting: Pulmonary Disease

## 2014-11-10 DIAGNOSIS — J438 Other emphysema: Secondary | ICD-10-CM | POA: Diagnosis not present

## 2014-11-12 ENCOUNTER — Encounter (HOSPITAL_COMMUNITY)
Admission: RE | Admit: 2014-11-12 | Discharge: 2014-11-12 | Disposition: A | Discharge status: Home / Self Care | Payer: Medicare Other | Source: Ambulatory Visit | Attending: Pulmonary Disease | Admitting: Pulmonary Disease

## 2014-11-12 DIAGNOSIS — J438 Other emphysema: Secondary | ICD-10-CM | POA: Diagnosis not present

## 2014-11-17 ENCOUNTER — Encounter (HOSPITAL_COMMUNITY): Payer: Medicare Other

## 2014-11-19 ENCOUNTER — Encounter (HOSPITAL_COMMUNITY)
Admission: RE | Admit: 2014-11-19 | Discharge: 2014-11-19 | Disposition: A | Discharge status: Home / Self Care | Payer: Medicare Other | Source: Ambulatory Visit | Attending: Pulmonary Disease | Admitting: Pulmonary Disease

## 2014-11-19 DIAGNOSIS — J438 Other emphysema: Secondary | ICD-10-CM | POA: Diagnosis present

## 2014-11-20 NOTE — Progress Notes (Signed)
Pulmonary Rehabilitation Program Outcomes Report   Orientation:  09/17/14 Graduate Date:  tbd Discharge Date:  tbd # of sessions completed: 18  Pulmonologist: Clance Family MD:  Maxcine HamBadger Class Time:  1045  A.  Exercise Program:  Tolerates exercise at 3.47 mets  B.  Mental Health:  Good mental attitude  C.  Education/Instruction/Skills  Accurately checks own pulse.  Rest:  76  Exercise:  98 and Knows THR for exercise  Uses Perceived Exertion Scale and/or Dyspnea Scale  D.  Nutrition/Weight Control/Body Composition:  Adherence to prescribed nutrition program: fair    E.  Blood Lipids   No results found for: CHOL, HDL, LDLCALC, LDLDIRECT, TRIG, CHOLHDL  F.  Lifestyle Changes:  Continues to smoke  G.  Symptoms noted with exercise:  Asymptomatic  Report Completed By:  Doretha Sou Kalicia Dufresne RN   Comments:  This is patients halfway progress note for [facility]. Patient is doing well in the program.

## 2014-11-24 ENCOUNTER — Encounter (HOSPITAL_COMMUNITY): Payer: Medicare Other

## 2014-11-26 ENCOUNTER — Encounter (HOSPITAL_COMMUNITY): Payer: Medicare Other

## 2014-12-01 ENCOUNTER — Encounter (HOSPITAL_COMMUNITY)
Admission: RE | Admit: 2014-12-01 | Discharge: 2014-12-01 | Disposition: A | Discharge status: Home / Self Care | Payer: Medicare Other | Source: Ambulatory Visit | Attending: Pulmonary Disease | Admitting: Pulmonary Disease

## 2014-12-01 DIAGNOSIS — J438 Other emphysema: Secondary | ICD-10-CM | POA: Diagnosis not present

## 2014-12-03 ENCOUNTER — Encounter (HOSPITAL_COMMUNITY)
Admission: RE | Admit: 2014-12-03 | Discharge: 2014-12-03 | Disposition: A | Discharge status: Home / Self Care | Payer: Medicare Other | Source: Ambulatory Visit | Attending: Pulmonary Disease | Admitting: Pulmonary Disease

## 2014-12-03 DIAGNOSIS — J438 Other emphysema: Secondary | ICD-10-CM | POA: Diagnosis not present

## 2014-12-08 ENCOUNTER — Encounter (HOSPITAL_COMMUNITY)
Admission: RE | Admit: 2014-12-08 | Discharge: 2014-12-08 | Disposition: A | Discharge status: Home / Self Care | Payer: Medicare Other | Source: Ambulatory Visit | Attending: Pulmonary Disease | Admitting: Pulmonary Disease

## 2014-12-08 DIAGNOSIS — J438 Other emphysema: Secondary | ICD-10-CM | POA: Diagnosis not present

## 2014-12-10 ENCOUNTER — Encounter (HOSPITAL_COMMUNITY)
Admission: RE | Admit: 2014-12-10 | Discharge: 2014-12-10 | Disposition: A | Discharge status: Home / Self Care | Payer: Medicare Other | Source: Ambulatory Visit | Attending: Pulmonary Disease | Admitting: Pulmonary Disease

## 2014-12-10 DIAGNOSIS — J438 Other emphysema: Secondary | ICD-10-CM | POA: Diagnosis not present

## 2014-12-15 ENCOUNTER — Encounter (HOSPITAL_COMMUNITY)
Admission: RE | Admit: 2014-12-15 | Discharge: 2014-12-15 | Disposition: A | Discharge status: Home / Self Care | Payer: Medicare Other | Source: Ambulatory Visit | Attending: Pulmonary Disease | Admitting: Pulmonary Disease

## 2014-12-15 DIAGNOSIS — J438 Other emphysema: Secondary | ICD-10-CM | POA: Diagnosis not present

## 2014-12-17 ENCOUNTER — Encounter (HOSPITAL_COMMUNITY)
Admission: RE | Admit: 2014-12-17 | Discharge: 2014-12-17 | Disposition: A | Discharge status: Home / Self Care | Payer: Medicare Other | Source: Ambulatory Visit | Attending: Pulmonary Disease | Admitting: Pulmonary Disease

## 2014-12-17 DIAGNOSIS — J438 Other emphysema: Secondary | ICD-10-CM | POA: Diagnosis not present

## 2014-12-22 ENCOUNTER — Encounter (HOSPITAL_COMMUNITY)
Admission: RE | Admit: 2014-12-22 | Discharge: 2014-12-22 | Disposition: A | Discharge status: Home / Self Care | Payer: Medicare Other | Source: Ambulatory Visit | Attending: Pulmonary Disease | Admitting: Pulmonary Disease

## 2014-12-22 DIAGNOSIS — J438 Other emphysema: Secondary | ICD-10-CM | POA: Diagnosis not present

## 2014-12-24 ENCOUNTER — Encounter (HOSPITAL_COMMUNITY): Payer: Medicare Other

## 2014-12-29 ENCOUNTER — Encounter (HOSPITAL_COMMUNITY)
Admission: RE | Admit: 2014-12-29 | Discharge: 2014-12-29 | Disposition: A | Discharge status: Home / Self Care | Payer: Medicare Other | Source: Ambulatory Visit | Attending: Pulmonary Disease | Admitting: Pulmonary Disease

## 2014-12-29 DIAGNOSIS — J438 Other emphysema: Secondary | ICD-10-CM | POA: Diagnosis not present

## 2014-12-31 ENCOUNTER — Encounter (HOSPITAL_COMMUNITY)
Admission: RE | Admit: 2014-12-31 | Discharge: 2014-12-31 | Disposition: A | Discharge status: Home / Self Care | Payer: Medicare Other | Source: Ambulatory Visit | Attending: Pulmonary Disease | Admitting: Pulmonary Disease

## 2014-12-31 DIAGNOSIS — J438 Other emphysema: Secondary | ICD-10-CM | POA: Diagnosis not present

## 2015-01-05 ENCOUNTER — Encounter (HOSPITAL_COMMUNITY)
Admission: RE | Admit: 2015-01-05 | Discharge: 2015-01-05 | Disposition: A | Discharge status: Home / Self Care | Payer: Medicare Other | Source: Ambulatory Visit | Attending: Pulmonary Disease | Admitting: Pulmonary Disease

## 2015-01-05 DIAGNOSIS — J438 Other emphysema: Secondary | ICD-10-CM | POA: Diagnosis not present

## 2015-01-07 ENCOUNTER — Encounter (HOSPITAL_COMMUNITY): Admission: RE | Admit: 2015-01-07 | Payer: Medicare Other | Source: Ambulatory Visit

## 2015-01-12 ENCOUNTER — Encounter (HOSPITAL_COMMUNITY): Payer: Medicare Other

## 2015-01-14 ENCOUNTER — Encounter (HOSPITAL_COMMUNITY): Payer: Medicare Other

## 2015-01-14 ENCOUNTER — Ambulatory Visit (HOSPITAL_COMMUNITY): Payer: Medicare Other

## 2015-01-19 ENCOUNTER — Ambulatory Visit (HOSPITAL_COMMUNITY)
Admission: RE | Admit: 2015-01-19 | Discharge: 2015-01-19 | Disposition: A | Discharge status: Home / Self Care | Payer: Medicare Other | Source: Ambulatory Visit | Attending: Pulmonary Disease | Admitting: Pulmonary Disease

## 2015-01-19 ENCOUNTER — Encounter (HOSPITAL_COMMUNITY)
Admission: RE | Admit: 2015-01-19 | Discharge: 2015-01-19 | Disposition: A | Discharge status: Home / Self Care | Payer: Medicare Other | Source: Ambulatory Visit | Attending: Pulmonary Disease | Admitting: Pulmonary Disease

## 2015-01-19 DIAGNOSIS — R911 Solitary pulmonary nodule: Secondary | ICD-10-CM | POA: Diagnosis not present

## 2015-01-19 DIAGNOSIS — J438 Other emphysema: Secondary | ICD-10-CM | POA: Insufficient documentation

## 2015-01-19 DIAGNOSIS — Z09 Encounter for follow-up examination after completed treatment for conditions other than malignant neoplasm: Secondary | ICD-10-CM | POA: Diagnosis present

## 2015-01-19 DIAGNOSIS — J449 Chronic obstructive pulmonary disease, unspecified: Secondary | ICD-10-CM | POA: Diagnosis not present

## 2015-01-19 DIAGNOSIS — R0602 Shortness of breath: Secondary | ICD-10-CM | POA: Insufficient documentation

## 2015-01-19 DIAGNOSIS — R079 Chest pain, unspecified: Secondary | ICD-10-CM | POA: Diagnosis not present

## 2015-01-19 DIAGNOSIS — R918 Other nonspecific abnormal finding of lung field: Secondary | ICD-10-CM

## 2015-01-19 DIAGNOSIS — J432 Centrilobular emphysema: Secondary | ICD-10-CM | POA: Diagnosis not present

## 2015-01-21 ENCOUNTER — Encounter (HOSPITAL_COMMUNITY)
Admission: RE | Admit: 2015-01-21 | Discharge: 2015-01-21 | Disposition: A | Discharge status: Home / Self Care | Payer: Medicare Other | Source: Ambulatory Visit | Attending: Pulmonary Disease | Admitting: Pulmonary Disease

## 2015-01-21 DIAGNOSIS — J438 Other emphysema: Secondary | ICD-10-CM | POA: Diagnosis not present

## 2015-01-26 ENCOUNTER — Encounter (HOSPITAL_COMMUNITY)
Admission: RE | Admit: 2015-01-26 | Discharge: 2015-01-26 | Disposition: A | Discharge status: Home / Self Care | Payer: Medicare Other | Source: Ambulatory Visit | Attending: Pulmonary Disease | Admitting: Pulmonary Disease

## 2015-01-26 DIAGNOSIS — J438 Other emphysema: Secondary | ICD-10-CM | POA: Diagnosis not present

## 2015-01-28 ENCOUNTER — Encounter (HOSPITAL_COMMUNITY)
Admission: RE | Admit: 2015-01-28 | Discharge: 2015-01-28 | Disposition: A | Discharge status: Home / Self Care | Payer: Medicare Other | Source: Ambulatory Visit | Attending: Pulmonary Disease | Admitting: Pulmonary Disease

## 2015-01-28 DIAGNOSIS — J438 Other emphysema: Secondary | ICD-10-CM | POA: Diagnosis not present

## 2015-02-02 ENCOUNTER — Encounter (HOSPITAL_COMMUNITY)
Admission: RE | Admit: 2015-02-02 | Discharge: 2015-02-02 | Disposition: A | Discharge status: Home / Self Care | Payer: Medicare Other | Source: Ambulatory Visit | Attending: Pulmonary Disease | Admitting: Pulmonary Disease

## 2015-02-02 DIAGNOSIS — J438 Other emphysema: Secondary | ICD-10-CM | POA: Diagnosis not present

## 2015-02-04 ENCOUNTER — Encounter (HOSPITAL_COMMUNITY)
Admission: RE | Admit: 2015-02-04 | Discharge: 2015-02-04 | Disposition: A | Discharge status: Home / Self Care | Payer: Medicare Other | Source: Ambulatory Visit | Attending: Pulmonary Disease | Admitting: Pulmonary Disease

## 2015-02-04 DIAGNOSIS — J438 Other emphysema: Secondary | ICD-10-CM | POA: Diagnosis not present

## 2015-02-09 ENCOUNTER — Encounter (HOSPITAL_COMMUNITY)
Admission: RE | Admit: 2015-02-09 | Discharge: 2015-02-09 | Disposition: A | Discharge status: Home / Self Care | Payer: Medicare Other | Source: Ambulatory Visit | Attending: Pulmonary Disease | Admitting: Pulmonary Disease

## 2015-02-09 DIAGNOSIS — J438 Other emphysema: Secondary | ICD-10-CM | POA: Diagnosis not present

## 2015-02-11 ENCOUNTER — Encounter (HOSPITAL_COMMUNITY)
Admission: RE | Admit: 2015-02-11 | Discharge: 2015-02-11 | Disposition: A | Discharge status: Home / Self Care | Payer: Medicare Other | Source: Ambulatory Visit | Attending: Pulmonary Disease | Admitting: Pulmonary Disease

## 2015-02-11 DIAGNOSIS — J438 Other emphysema: Secondary | ICD-10-CM | POA: Diagnosis not present

## 2015-02-16 ENCOUNTER — Encounter (HOSPITAL_COMMUNITY): Payer: Medicare Other

## 2015-02-18 ENCOUNTER — Encounter (HOSPITAL_COMMUNITY): Payer: Medicare Other

## 2015-02-23 ENCOUNTER — Encounter (HOSPITAL_COMMUNITY): Payer: Medicare Other

## 2015-02-25 ENCOUNTER — Encounter (HOSPITAL_COMMUNITY): Payer: Medicare Other

## 2015-03-02 NOTE — Progress Notes (Signed)
Patient is discharged from [facility] Pulmonary program today, February 11, 2015 with 36 sessions.  He achieved LTG of 30 minutes of aerobic exercise at max met level of 3.47.  All patient vitals are WNL.  Patient has met with dietician.  Discharge instructions have been reviewed in detail and patient expressed an understanding of material given.  Patient plans to join the maintenance program. Cardiac Rehab will make 1 month, 6 month and 1 year call backs.  Patient had no complaints of any abnormal S/S or pain on their exit visit.  Patient finished post walk test.

## 2015-03-02 NOTE — Progress Notes (Signed)
Pulmonary Rehabilitation Program Outcomes Report   Orientation:  09/17/14 Graduate Date:  02/11/15 Discharge Date:  02/11/15 # of sessions completed: 36  Pulmonologist: Clance Family MD:  Maxcine Ham Time:  1045  A.  Exercise Program:  Tolerates exercise @ 3.47 METS for 15 minutes, Walk Test Results:  Post: 2.64 mets, Improved functional capacity  21.7 %, Decreased  muscular strength  2. %, Decreased dyspnea score 25 %, No Change education score 0 % and Progressed to Phase 4 maintenance program  B.  Mental Health:  Good mental attitude  C.  Education/Instruction/Skills  Accurately checks own pulse.  Rest:  93  Exercise:  101, Knows THR for exercise, Uses Perceived Exertion Scale and/or Dyspnea Scale and Attended all education classes  Anticipated home compliance with respiratory program:  good  D.  Nutrition/Weight Control/Body Composition:  Adherence to prescribed nutrition program: fair    E.  Blood Lipids   No results found for: CHOL, HDL, LDLCALC, LDLDIRECT, TRIG, CHOLHDL  F.  Lifestyle Changes:  Making positive lifestyle changes, Continues to smoke and Needs physician encouragement on making lifestyle changes  G.  Symptoms noted with exercise:  Asymptomatic  Report Completed By:  Doretha Sou RN   Comments:  This is the patients graduation progress note for [facility].  Patient has done very well in the program and states he can walk longer and do more things without getting SOB.

## 2015-03-02 NOTE — Addendum Note (Signed)
Encounter addended by: Andrey Cota, RN on: 03/02/2015  1:10 PM<BR>     Documentation filed: Notes Section

## 2015-03-04 ENCOUNTER — Encounter: Payer: Self-pay | Admitting: Pulmonary Disease

## 2015-03-04 ENCOUNTER — Telehealth: Payer: Self-pay | Admitting: Pulmonary Disease

## 2015-03-04 ENCOUNTER — Ambulatory Visit (INDEPENDENT_AMBULATORY_CARE_PROVIDER_SITE_OTHER): Payer: Medicare Other | Admitting: Pulmonary Disease

## 2015-03-04 ENCOUNTER — Other Ambulatory Visit: Payer: Medicare Other

## 2015-03-04 VITALS — BP 130/68 | HR 87 | Ht 72.0 in | Wt 160.2 lb

## 2015-03-04 DIAGNOSIS — Z720 Tobacco use: Secondary | ICD-10-CM | POA: Diagnosis not present

## 2015-03-04 DIAGNOSIS — R918 Other nonspecific abnormal finding of lung field: Secondary | ICD-10-CM

## 2015-03-04 DIAGNOSIS — G4734 Idiopathic sleep related nonobstructive alveolar hypoventilation: Secondary | ICD-10-CM

## 2015-03-04 DIAGNOSIS — J439 Emphysema, unspecified: Secondary | ICD-10-CM | POA: Insufficient documentation

## 2015-03-04 DIAGNOSIS — J302 Other seasonal allergic rhinitis: Secondary | ICD-10-CM

## 2015-03-04 HISTORY — DX: Emphysema, unspecified: J43.9

## 2015-03-04 MED ORDER — AEROCHAMBER Z-STAT PLUS MISC: Status: AC

## 2015-03-04 MED ORDER — MONTELUKAST SODIUM 5 MG PO CHEW: 5.0000 mg | CHEWABLE_TABLET | Freq: Every day | ORAL | Status: DC

## 2015-03-04 NOTE — Progress Notes (Signed)
Subjective:    Patient ID: John Marsh, male    DOB: 11/14/1945, 69 y.o.   MRN: [id number]  C.C.:  Follow-up for Very Severe COPD w/ Emphysema, Bilateral Lung Nodules, Nocturnal Hypoxia, & Ongoing Tobacco Use.  HPI Very Severe COPD w/ Emphysema:  Reports he is compliant with Symbicort & Spiriva. He reports he has been on these for at least a year. He denies any wheezing. He has a daily cough that is intermittent but mostly in the morning. He reports he will cough up a clear, white mucus. He denies any hemoptysis. He has completed pulmonary rehab at [facility] and is now going to the maintenance classes.He reports he uses his rescue inhaler once a week. Reports burning in his legs seems to limit him more in terms of exertion than dyspnea. No exacerbations since last appointment.  Bilateral Lung Nodules: Patient found to have right upper lobe groundglass nodule on CT imaging in March 2016 initially. Repeat CT imaging from August 2016 shows multiple new subcentimeter groundglass nodules bilaterally. Patient also has evidence of calcified pulmonary nodules and splenic calcification/mediastinal lymph node calcification suggestive of prior granulomatous disease. He has lived in [place] & [place] previously.   Nocturnal Hypoxia: Overnight pulse ox from March 2016 showed significant desaturation while sleeping on room air. No snore. Does wake up with a morning headache. Denies any napping. No dozing.   Ongoing Tobacco Use:  He has cut back to 10 cigarettes daily. He reports he has cut back from 3ppd. He has been trying to remove himself from situations that are prone to cigarette use. He feels he smokes more out of being bored. He has never tried nicotine replacement and denies any cravings.   Review of Systems  He denies any reflux, dyspepsia, or morning brash water taste. He reports he does have significant sinus congestion & pressure on a seasonal basis. He reports this seems to be worse in the Fall.  No Known  Allergies  Current Outpatient Prescriptions on File Prior to Visit  Medication Sig Dispense Refill  . albuterol (PROVENTIL HFA;VENTOLIN HFA) 108 (90 BASE) MCG/ACT inhaler Inhale 2 puffs into the lungs every 6 (six) hours as needed for wheezing or shortness of breath.    Marland Kitchen aspirin EC 81 MG tablet Take 81 mg by mouth daily.    . budesonide-formoterol (SYMBICORT) 160-4.5 MCG/ACT inhaler Inhale 2 puffs into the lungs 2 (two) times daily.    . clopidogrel (PLAVIX) 75 MG tablet Take 75 mg by mouth daily.    . hydrochlorothiazide (HYDRODIURIL) 25 MG tablet Take 25 mg by mouth. Once daily    . nitroGLYCERIN (NITROSTAT) 0.4 MG SL tablet Place 0.4 mg under the tongue every 5 (five) minutes as needed for chest pain. x3 doses as needed for chest pain    . rosuvastatin (CRESTOR) 20 MG tablet Take 20 mg by mouth at bedtime.    Marland Kitchen tiotropium (SPIRIVA) 18 MCG inhalation capsule Place 18 mcg into inhaler and inhale daily.     No current facility-administered medications on file prior to visit.   Past Medical History  Diagnosis Date  . Hypertension   . COPD (chronic obstructive pulmonary disease)   . CHF (congestive heart failure)   . MI (myocardial infarction)   . Hyperlipidemia   . Emphysema of lung   . Nocturnal hypoxia   . Multiple lung nodules on CT    Past Surgical History  Procedure Laterality Date  . Stent placed    . Cholecystectomy    .  Nasal septum surgery     Family History  Problem Relation Age of Onset  . Heart disease Mother     died at 51  . Heart disease Father   . Heart disease Brother   . Heart disease Sister   . Lung disease Neg Hx    Social History   Social History  . Marital Status: Single    Spouse Name: N/A  . Number of Children: N/A  . Years of Education: N/A   Occupational History  . retired    Social History Main Topics  . Smoking status: Current Every Day Smoker -- 3.00 packs/day for 55 years    Types: Cigarettes    Start date: 08/01/1959  . Smokeless  tobacco: None     Comment: now down to 10 cigs daily 03/04/15 - peak 3ppd  . Alcohol Use: No  . Drug Use: No  . Sexual Activity: Not Asked   Other Topics Concern  . None   Social History Narrative   Originally from [place]. Previously has lived in [place]. He moved to [facility] in [place]. He serve in [place]. 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 He has been to [place], [place], [place], several countries in [place], countries in [place], [place], & multiple [place]. 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 He has a dog, 7 cats, & 2 horses. Remote exposure to a parrot for a few years in a different home. No mold exposure. No hot tub exposure. 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       Objective:   Physical Exam Blood pressure 130/68, pulse 87, height 6' (1.829 m), weight 160 lb 3.2 oz (72.666 kg), SpO2 97 %. General:  Awake. Alert. Thin Caucasian male.  Integument:  Warm & dry. No rash on exposed skin. No bruising. HEENT:  Moist mucus membranes. No oral ulcers. No scleral injection or icterus. Minimal nasal turbinate swelling. PERRL. Cardiovascular:  Regular rate. No edema. No appreciable JVD.  Pulmonary:  Clear to auscultation bilaterally. Prolonged exhalation phase & distant breath sounds. Symmetric chest wall expansion. No accessory muscle use. Abdomen: Soft. Normal bowel sounds. Nondistended. Grossly nontender. Neurological:  CN 2-12 grossly in tact. No meningismus. Moving all 4 extremities equally.   PFT 09/14/14: FVC 2.50 L (51%) FEV1 1.15 L (32%) FEV1/FVC 0.46 FEF 25-75 0.33 L (12%) positive bronchodilator response TLC 9.10 L (120%) RV 236% DLCO uncorrected 36%  OVERNIGHT PULSE OX 08/18/14: Performed on room air & 7 hours 56 minutes analyzed. Lowest saturation 83%. 14.3 minutes with saturation </= 88%. 73 desaturations events. Lowest pulse 56 bpm.  IMAGING CT CHEST W/O  01/19/15 (personally reviewed by me): No pleural effusion or thickening appreciated. Scattered bilateral subcentimeter groundglass pulmonary nodules. Additional calcified pulmonary nodules consistent with evidence of prior granulomatous disease given mediastinal lymph node with calcification & calcification within spleen. Radiologist commented on 1.2 x 1.2 irregular groundglass focus in the right upper lobe which is significantly distorted by severe upper lobe predominant emphysema. No pathologic mediastinal adenopathy. Radiologist commented that there are no bruits subcentimeter nodules that are new. Indeed these nodules were not present on my review of CT imaging from 2005 & 2006. The first time these were found was March 2016.  LABS 08/08/12 CBC: 6.5/15.0/42.5/171 BMP: 139/3.6/99/29/9/0.89/144/9.3   Assessment & Plan:  69 year old male with very severe COPD and emphysema as well as ongoing tobacco  use. He has successfully completed pulmonary rehabilitation and is currently in the maintenance phase. I do question whether he needs to have better drug delivery given his bronchodilator response on his prior PFT. He has also not been screened for alpha-1 AT deficiency yet that I can find. We discussed his pulmonary nodules which could be due to a prior inflammatory process but need to be monitored for progression to a more solid state that could suggest malignancy. In reviewing his prior overnight pulse ox he did have significant desaturation that would qualify for oxygen therapy. He has no symptoms that would suggest sleep apnea. Given his seasonal allergies he may benefit from Singulair use. I instructed the patient to notify me if he had any new breathing problems before his next appointment.  1. Very severe COPD with emphysema: Screening for alpha-1 antitrypsin deficiency. Continuing Symbicort & Spiriva. Giving patient a spacer to use with Symbicort. Repeats problem treated with bronchodilator challenge at  next appointment as well as 6 minute walk test. 2. Bilateral pulmonary nodules: Plan for repeat CT scan of the chest without contrast February 2017. Plan to order at next appointment. 3. Nocturnal hypoxia: Repeating overnight pulse oximetry on room air. If this continues to show significant desaturation plan to arrange for oxygen at 2 L/m by nasal cannula while sleeping and then repeat overnight pulse oximetry on 2 L/m to ensure correction of nocturnal hypoxia. 4. Ongoing tobacco use: Spelled over 3 minutes counseling the patient related for complete tobacco cessation. He is going to try behavior modification. 5. Seasonal allergies: Starting the patient on Singulair 5 mg by mouth daily at bedtime. Checking serum IgE & RAST panel. 6. Health maintenance: Patient reports he has received the pneumonia vaccine. Advised him to obtain influenza vaccine in October. 7. Follow-up: Patient to return to clinic in 3 months or sooner if needed.

## 2015-03-04 NOTE — Telephone Encounter (Signed)
PFT 09/14/14: FVC 2.50 L (51%) FEV1 1.15 L (32%) FEV1/FVC 0.46 FEF 25-75 0.33 L (12%) positive bronchodilator response TLC 9.10 L (120%) RV 236% DLCO uncorrected 36%  OVERNIGHT PULSE OX 08/18/14: Performed on room air & 7 hours 56 minutes analyzed. Lowest saturation 83%. 14.3 minutes with saturation </= 88%. 73 desaturations events. Lowest pulse 56 bpm.  IMAGING CT CHEST W/O 01/19/15 (personally reviewed by me): No pleural effusion or thickening appreciated. Scattered bilateral subcentimeter groundglass pulmonary nodules. Additional calcified pulmonary nodules consistent with evidence of prior granulomatous disease given mediastinal lymph node with calcification & calcification within spleen. Radiologist commented on 1.2 x 1.2 irregular groundglass focus in the right upper lobe which is significantly distorted by severe upper lobe predominant emphysema. No pathologic mediastinal adenopathy. Radiologist commented that there are no bruits subcentimeter nodules that are new. Indeed these nodules were not present on my review of CT imaging from 2005 & 2006. The first time these were found was March 2016.  LABS 08/08/12 CBC: 6.5/15.0/42.5/171 BMP: 139/3.6/99/29/9/0.89/144/9.3

## 2015-03-04 NOTE — Patient Instructions (Addendum)
1. We are repeating your overnight pulse oximetry test before starting you on oxygen due to insurance requirements. If it looks like you still need oxygen, as I expect you will, we'll arrange for it then repeat another overnight test to make sure your oxygen level is staying up. 2. Continue taking your Symbicort & Spiriva as prescribed. We are giving you a spacer today to use with her Symbicort. Please remember to clean your spacer after use it as directed. 3. I'm starting you on an allergy pill at night before bedtime. This should help her sinuses as well as her coughing. 4. I'm checking you for the genetic form of COPD and emphysema today. I'm also checking blood work for allergies as well. 5. Please remember to bring her walker to your next appointment as we will be performing a walk test as well as a repeat breathing test. 6. We will repeat your chest CT scan in February to continue to monitor your lung nodules. 7. Remember to get short influenza vaccine in October. 8. I will see her back in 3 months or sooner if needed. Please call my office if you have any further questions or concerns.

## 2015-03-05 LAB — ALLERGY FULL PROFILE
ALTERNARIA ALTERNATA: 0.27 kU/L — AB
Allergen,Goose feathers, e70: <0.1 kU/L
Aspergillus fumigatus, m3: 0.36 kU/L — ABNORMAL HIGH
Bahia Grass: <0.1 kU/L
Box Elder IgE: <0.1 kU/L
Common Ragweed: <0.1 kU/L
D. farinae: <0.1 kU/L
Dog Dander: <0.1 kU/L
Elm IgE: <0.1 kU/L
Fescue: <0.1 kU/L
G005 Rye, Perennial: <0.1 kU/L
Goldenrod: <0.1 kU/L
Helminthosporium halodes: 0.3 kU/L — ABNORMAL HIGH
House Dust Hollister: <0.1 kU/L
IGE (IMMUNOGLOBULIN E), SERUM: 21 kU/L (ref <115)
Lamb's Quarters: <0.1 kU/L
Stemphylium Botryosum: <0.1 kU/L
Sycamore Tree: <0.1 kU/L
Timothy Grass: <0.1 kU/L

## 2015-03-09 LAB — ALPHA-1 ANTITRYPSIN PHENOTYPE: A1 ANTITRYPSIN: 205 mg/dL — AB (ref 83–199)

## 2015-03-11 ENCOUNTER — Telehealth: Payer: Self-pay | Admitting: Pulmonary Disease

## 2015-03-11 NOTE — Telephone Encounter (Signed)
OVERNIGHT PULSE OX 03/09/15:  Performed on RA. 9:12:32 analyzed. Spent 10:32 with saturation </= 88%.

## 2015-03-12 ENCOUNTER — Telehealth: Payer: Self-pay | Admitting: *Deleted

## 2015-03-12 DIAGNOSIS — G4734 Idiopathic sleep related nonobstructive alveolar hypoventilation: Secondary | ICD-10-CM

## 2015-03-12 DIAGNOSIS — J439 Emphysema, unspecified: Secondary | ICD-10-CM

## 2015-03-12 NOTE — Telephone Encounter (Signed)
-----   Message from Roslynn Amble, MD sent at 03/11/2015  5:33 PM EDT ----- Regarding: Home Oxygen Patient's overnight pulse ox showed significant desaturation. Please put in a Rx for 2 L/m while sleeping & let him know we need to repeat his overnight pulse ox on 2 L/m to make sure this is corrected. Thanks.

## 2015-03-12 NOTE — Telephone Encounter (Signed)
I spoke with patient about results and he verbalized understanding and had no questions. Orders placed. Nothing further needed

## 2015-04-13 ENCOUNTER — Encounter: Payer: Self-pay | Admitting: Pulmonary Disease

## 2015-06-03 ENCOUNTER — Ambulatory Visit (INDEPENDENT_AMBULATORY_CARE_PROVIDER_SITE_OTHER): Payer: Medicare Other | Admitting: Pulmonary Disease

## 2015-06-03 ENCOUNTER — Encounter: Payer: Self-pay | Admitting: Pulmonary Disease

## 2015-06-03 VITALS — BP 138/74 | HR 68 | Ht 72.5 in | Wt 158.0 lb

## 2015-06-03 DIAGNOSIS — J439 Emphysema, unspecified: Secondary | ICD-10-CM | POA: Diagnosis not present

## 2015-06-03 DIAGNOSIS — J438 Other emphysema: Secondary | ICD-10-CM | POA: Diagnosis not present

## 2015-06-03 DIAGNOSIS — J449 Chronic obstructive pulmonary disease, unspecified: Secondary | ICD-10-CM

## 2015-06-03 DIAGNOSIS — G4734 Idiopathic sleep related nonobstructive alveolar hypoventilation: Secondary | ICD-10-CM

## 2015-06-03 DIAGNOSIS — R0602 Shortness of breath: Secondary | ICD-10-CM

## 2015-06-03 DIAGNOSIS — F17210 Nicotine dependence, cigarettes, uncomplicated: Secondary | ICD-10-CM | POA: Diagnosis not present

## 2015-06-03 DIAGNOSIS — J302 Other seasonal allergic rhinitis: Secondary | ICD-10-CM

## 2015-06-03 DIAGNOSIS — Z23 Encounter for immunization: Secondary | ICD-10-CM | POA: Diagnosis not present

## 2015-06-03 DIAGNOSIS — R918 Other nonspecific abnormal finding of lung field: Secondary | ICD-10-CM | POA: Diagnosis not present

## 2015-06-03 DIAGNOSIS — J309 Allergic rhinitis, unspecified: Secondary | ICD-10-CM | POA: Insufficient documentation

## 2015-06-03 DIAGNOSIS — R0600 Dyspnea, unspecified: Secondary | ICD-10-CM

## 2015-06-03 DIAGNOSIS — F17200 Nicotine dependence, unspecified, uncomplicated: Secondary | ICD-10-CM

## 2015-06-03 HISTORY — DX: Shortness of breath: R06.02

## 2015-06-03 LAB — PULMONARY FUNCTION TEST
FEF 25-75 POST: 0.52 L/s
FEF 25-75 Pre: 0.47 L/sec
FEF2575-%Change-Post: 11 %
FEF2575-%PRED-POST: 19 %
FEF2575-%Pred-Pre: 17 %
FEV1-%CHANGE-POST: 8 %
FEV1-%Pred-Post: 36 %
FEV1-%Pred-Pre: 33 %
FEV1-POST: 1.29 L
FEV1-Pre: 1.2 L
FEV1FVC-%Change-Post: 9 %
FEV1FVC-%PRED-PRE: 53 %
FEV6-%Change-Post: 5 %
FEV6-%PRED-POST: 64 %
FEV6-%Pred-Pre: 61 %
FEV6-POST: 2.94 L
FEV6-PRE: 2.8 L
FEV6FVC-%CHANGE-POST: 6 %
FEV6FVC-%PRED-POST: 102 %
FEV6FVC-%PRED-PRE: 95 %
FVC-%CHANGE-POST: -1 %
FVC-%PRED-POST: 62 %
FVC-%PRED-PRE: 63 %
FVC-POST: 3.03 L
FVC-PRE: 3.07 L
POST FEV1/FVC RATIO: 43 %
PRE FEV6/FVC RATIO: 91 %
Post FEV6/FVC ratio: 97 %
Pre FEV1/FVC ratio: 39 %

## 2015-06-03 NOTE — Progress Notes (Signed)
Spirometry pre and post done today. 

## 2015-06-03 NOTE — Patient Instructions (Addendum)
1. Continue taking your medications as prescribed. 2. Call me if you need any refills. 3. We will repeat your chest CT scan for your lung nodules in February 2017. 4. I will see you back in 6 months or sooner if needed.

## 2015-06-03 NOTE — Progress Notes (Signed)
Reviewed

## 2015-06-03 NOTE — Progress Notes (Signed)
Subjective:    Patient ID: John Marsh, male    DOB: 1946/05/01, 69 y.o.   MRN: [id number]  C.C.:  Follow-up for Very Severe COPD w/ Emphysema, Bilateral Lung Nodules, Nocturnal Hypoxia, Allergic Rhinitis, & Ongoing Tobacco Use.  HPI Very Severe COPD w/ Emphysema:  He has completed pulmonary rehab at [facility] and is now going to the maintenance classes.  Patient given spacer to use with Symbicort at last appointment. He does feel his breathing is doing better. He reports only mild, intermittent cough and wheezing. Cough is productive of a white mucus. Compliant with Spiriva and Symbicort. He uses his rescue inhaler rarely.   Bilateral Lung Nodules: Patient found to have right upper lobe groundglass nodule on CT imaging in March 2016 initially. Repeat CT imaging from August 2016 shows multiple new subcentimeter groundglass nodules bilaterally. Patient also has evidence of calcified pulmonary nodules and splenic calcification/mediastinal lymph node calcification suggestive of prior granulomatous disease. Previously lived in [place] previously. When repeat CT scan in February 2017.  Nocturnal Hypoxia: Overnight pulse ox from March 2016 showed significant desaturation while sleeping on room air. Overnight pulse ox again demonstrated significant desaturation on room air. Subsequently prescribed oxygen at 2 L/m by nasal cannula while sleeping. Patient is not consistently using this while sleeping at night. He reports his concentrator is loud and wakes him up at night.   Allergic Rhinitis:  Started on Singulair  at last appointment. More seasonal. Serum testing unremarkable. He reports his sinus congestion has improved on medication. No significant post-nasal drainage.  Ongoing Tobacco Use:  Never previously try nicotine replacement for smoking cessation. He has continued to smoke 1/2ppd. He reports he is smoking most first thing in the morning with his coffee. He admits he would like to quit but  feels it is contributing to his quality of life through relaxation.   Review of Systems  He reports chronic chest tightness. No significant chest pain. No fever, chills, or sweats.   No Known Allergies  Current Outpatient Prescriptions on File Prior to Visit  Medication Sig Dispense Refill  . albuterol (PROVENTIL HFA;VENTOLIN HFA) 108 (90 BASE) MCG/ACT inhaler Inhale 2 puffs into the lungs every 6 (six) hours as needed for wheezing or shortness of breath.    Marland Kitchen aspirin EC 81 MG tablet Take 81 mg by mouth daily.    . budesonide-formoterol (SYMBICORT) 160-4.5 MCG/ACT inhaler Inhale 2 puffs into the lungs 2 (two) times daily.    . clopidogrel (PLAVIX) 75 MG tablet Take 75 mg by mouth daily.    . hydrochlorothiazide (HYDRODIURIL) 25 MG tablet Take 25 mg by mouth. Once daily    . montelukast (SINGULAIR) 5 MG chewable tablet Chew 1 tablet (5 mg total) by mouth at bedtime. 30 tablet 3  . nitroGLYCERIN (NITROSTAT) 0.4 MG SL tablet Place 0.4 mg under the tongue every 5 (five) minutes as needed for chest pain. x3 doses as needed for chest pain    . rosuvastatin (CRESTOR) 20 MG tablet Take 20 mg by mouth at bedtime.    Marland Kitchen Spacer/Aero-Holding Chambers (AEROCHAMBER Z-STAT PLUS) inhaler Use as instructed 1 each 0  . tiotropium (SPIRIVA) 18 MCG inhalation capsule Place 18 mcg into inhaler and inhale daily.     No current facility-administered medications on file prior to visit.   Past Medical History  Diagnosis Date  . Hypertension   . COPD (chronic obstructive pulmonary disease) (HCC)   . CHF (congestive heart failure) (HCC)   .  MI (myocardial infarction) (HCC)   . Hyperlipidemia   . Emphysema of lung (HCC)   . Nocturnal hypoxia   . Multiple lung nodules on CT    Past Surgical History  Procedure Laterality Date  . Stent placed    . Cholecystectomy    . Nasal septum surgery     Family History  Problem Relation Age of Onset  . Heart disease Mother     died at 6857  . Heart disease Father   .  Heart disease Brother   . Heart disease Sister   . Lung disease Neg Hx    Social History   Social History  . Marital Status: Single    Spouse Name: N/A  . Number of Children: N/A  . Years of Education: N/A   Occupational History  . retired    Social History Main Topics  . Smoking status: Current Every Day Smoker -- 3.00 packs/day for 55 years    Types: Cigarettes    Start date: 08/01/1959  . Smokeless tobacco: None     Comment: 1/2 ppd 06/03/15 - peak 3ppd  . Alcohol Use: No  . Drug Use: No  . Sexual Activity: Not Asked   Other Topics Concern  . None   Social History Narrative   [place] [place]. He serve in [place]. 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 He has been to [place], [place], [place], [place] countries in [place], countries in [place], [place], & multiple [place]. 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 He has a dog, 7 cats, & 2 horses. Remote exposure to a parrot for a few years in a different home. No mold exposure. No hot tub exposure. 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       Objective:   Physical Exam BP 138/74 mmHg  Pulse 68  Ht 6' 0.5" (1.842 m)  Wt 158 lb (71.668 kg)  BMI 21.12 kg/m2  SpO2 98% General:  Thin male. No distress. Awake. Alert. Integument:  Warm & dry. No rash on exposed skin.  HEENT:  No oral ulcers. No scleral injection or icterus. No nasal turbinate swelling.  Cardiovascular:  Regular rate. No edema. Normal S1 & S2. Pulmonary:  Symmetrically decreased breath sounds. Clear to auscultation. Prolonged exhalation phase. Abdomen: Soft. Normal bowel sounds. Nontender. Lymphatics: No appreciated cervical or supraclavicular lymphadenopathy.   PFT 06/03/15: FVC 3.07 L (63%) FEV1 1.20 L (33%) FEV1/FVC 0.39 FEF 25-75 0.47 L (17%) negative bronchodilator  response 09/14/14: FVC 2.50 L (51%) FEV1 1.15 L (32%) FEV1/FVC 0.46 FEF 25-75 0.33 L (12%) positive bronchodilator response TLC 9.10 L (120%) RV 236% DLCO uncorrected 36%  6MWT 06/03/15:  Walked 336 meters / Baseline Sat 97% on RA / Nadir Sat 97% on RA  OVERNIGHT PULSE OX 03/09/15: Performed on RA. 9:12:32 analyzed. Spent 10:32 with saturation </= 88%. 08/18/14: Performed on room air & 7 hours 56 minutes analyzed. Lowest saturation 83%. 14.3 minutes with saturation </= 88%. 73 desaturations events. Lowest pulse 56 bpm.  IMAGING CT CHEST W/O 01/19/15 (previously reviewed by me): No pleural effusion or thickening appreciated. Scattered bilateral subcentimeter groundglass pulmonary nodules. Additional calcified pulmonary nodules consistent with evidence of prior granulomatous disease given mediastinal lymph node with calcification & calcification within spleen. Radiologist commented on 1.2 x 1.2 irregular groundglass focus in  the right upper lobe which is significantly distorted by severe upper lobe predominant emphysema. No pathologic mediastinal adenopathy. Radiologist commented that there are no bruits subcentimeter nodules that are new. Indeed these nodules were not present on my review of CT imaging from 2005 & 2006. The first time these were found was March 2016.  LABS 03/04/15 Alpha-1 antitrypsin:  MM (205) IgE:  21 RAST Panel:  Aspergillus Fumgitagus 0.36 (class 1) / Alternaria alternata 0.27 (class 0/1) / helminthosporium halodes 0.30 (class 0/1)  08/08/12 CBC: 6.5/15.0/42.5/171 BMP: 139/3.6/99/29/9/0.89/144/9.3   Assessment & Plan:  69 year old male with very severe COPD and emphysema as well as ongoing tobacco use. I spent over 3 minutes today counseling the patient on the need for complete tobacco cessation as this is most probably the cause for his underlying COPD & emphysema. I reviewed his prior serum test results which showed no evidence for alpha-1 antitrypsin deficiency. We also  discussed the significant improvement and his allergic rhinitis on Singulair as well as a significant improvement in his spirometry with the use of a spacer for his Symbicort. He is continuing to participate in maintenance therapy with pulmonary rehabilitation which she thoroughly enjoys. We discussed his overnight pulse oximetry which did show significant desaturation with the patient has not been utilizing his oxygen due to the noise of his concentrator. He is going to culture plate continuing oxygen use/therapy by placing concentrator in an alternative room and simply running a longer amount of nasal cannula tubing versus discontinuing the nocturnal oxygen therapy altogether. I instructed the patient to notify my office if he had any new breathing problems before his next appointment.  1. Very severe COPD with emphysema: Continuing Symbicort & Spiriva. No changes at this time. 2. Bilateral pulmonary nodules: Repeat CT scan of the chest without contrast in February 2017. 3. Nocturnal hypoxia: Patient contemplating continued oxygen therapy at 2 L/m and will notify my office if he wishes to discontinue use. 4. Ongoing tobacco use: Again spent over 3 minutes counseling the patient will need for complete tobacco cessation. Patient is on a statin that he does not wish to completely quit at this time. 5. Seasonal allergies/allergic rhinitis: Symptoms well controlled on Singulair 5 mg by mouth daily at bedtime. No changes. 6. Health maintenance: Remotely received the pneumonia vaccine. Influenza vaccine October 2016. Administering Prevnar vaccine today. 7. Follow-up: Return to clinic in 6 months or sooner if needed.

## 2015-07-03 ENCOUNTER — Emergency Department (HOSPITAL_COMMUNITY): Payer: Medicare Other

## 2015-07-03 ENCOUNTER — Emergency Department (INDEPENDENT_AMBULATORY_CARE_PROVIDER_SITE_OTHER): Payer: Medicare Other

## 2015-07-03 ENCOUNTER — Inpatient Hospital Stay (HOSPITAL_COMMUNITY)
Admission: EM | Admit: 2015-07-03 | Discharge: 2015-07-05 | DRG: 190 | Disposition: A | Discharge status: Home / Self Care | Payer: Medicare Other | Attending: Internal Medicine | Admitting: Internal Medicine

## 2015-07-03 ENCOUNTER — Encounter (HOSPITAL_COMMUNITY): Payer: Self-pay | Admitting: Emergency Medicine

## 2015-07-03 ENCOUNTER — Emergency Department (INDEPENDENT_AMBULATORY_CARE_PROVIDER_SITE_OTHER)
Admission: EM | Admit: 2015-07-03 | Discharge: 2015-07-03 | Disposition: A | Discharge status: Nursing Home (Includes ICF) | Payer: Medicare Other | Source: Home / Self Care

## 2015-07-03 ENCOUNTER — Encounter (HOSPITAL_COMMUNITY): Payer: Self-pay | Admitting: *Deleted

## 2015-07-03 DIAGNOSIS — F17210 Nicotine dependence, cigarettes, uncomplicated: Secondary | ICD-10-CM | POA: Diagnosis present

## 2015-07-03 DIAGNOSIS — J9611 Chronic respiratory failure with hypoxia: Secondary | ICD-10-CM

## 2015-07-03 DIAGNOSIS — T380X5A Adverse effect of glucocorticoids and synthetic analogues, initial encounter: Secondary | ICD-10-CM | POA: Diagnosis not present

## 2015-07-03 DIAGNOSIS — Z7982 Long term (current) use of aspirin: Secondary | ICD-10-CM | POA: Diagnosis not present

## 2015-07-03 DIAGNOSIS — R000 Tachycardia, unspecified: Secondary | ICD-10-CM

## 2015-07-03 DIAGNOSIS — J441 Chronic obstructive pulmonary disease with (acute) exacerbation: Secondary | ICD-10-CM | POA: Diagnosis not present

## 2015-07-03 DIAGNOSIS — E785 Hyperlipidemia, unspecified: Secondary | ICD-10-CM | POA: Diagnosis present

## 2015-07-03 DIAGNOSIS — I25118 Atherosclerotic heart disease of native coronary artery with other forms of angina pectoris: Secondary | ICD-10-CM

## 2015-07-03 DIAGNOSIS — R739 Hyperglycemia, unspecified: Secondary | ICD-10-CM | POA: Diagnosis present

## 2015-07-03 DIAGNOSIS — I252 Old myocardial infarction: Secondary | ICD-10-CM

## 2015-07-03 DIAGNOSIS — J9621 Acute and chronic respiratory failure with hypoxia: Secondary | ICD-10-CM | POA: Diagnosis not present

## 2015-07-03 DIAGNOSIS — J9620 Acute and chronic respiratory failure, unspecified whether with hypoxia or hypercapnia: Secondary | ICD-10-CM | POA: Diagnosis present

## 2015-07-03 DIAGNOSIS — R0602 Shortness of breath: Secondary | ICD-10-CM | POA: Diagnosis present

## 2015-07-03 DIAGNOSIS — R0600 Dyspnea, unspecified: Secondary | ICD-10-CM

## 2015-07-03 DIAGNOSIS — I10 Essential (primary) hypertension: Secondary | ICD-10-CM | POA: Diagnosis present

## 2015-07-03 DIAGNOSIS — Z8249 Family history of ischemic heart disease and other diseases of the circulatory system: Secondary | ICD-10-CM

## 2015-07-03 DIAGNOSIS — Z7951 Long term (current) use of inhaled steroids: Secondary | ICD-10-CM | POA: Diagnosis not present

## 2015-07-03 DIAGNOSIS — F17200 Nicotine dependence, unspecified, uncomplicated: Secondary | ICD-10-CM | POA: Diagnosis present

## 2015-07-03 DIAGNOSIS — Z7902 Long term (current) use of antithrombotics/antiplatelets: Secondary | ICD-10-CM

## 2015-07-03 DIAGNOSIS — I2510 Atherosclerotic heart disease of native coronary artery without angina pectoris: Secondary | ICD-10-CM | POA: Diagnosis present

## 2015-07-03 HISTORY — DX: Chronic obstructive pulmonary disease with (acute) exacerbation: J44.1

## 2015-07-03 HISTORY — DX: Chronic respiratory failure with hypoxia: J96.11

## 2015-07-03 HISTORY — DX: Tachycardia, unspecified: R00.0

## 2015-07-03 HISTORY — DX: Reserved for inherently not codable concepts without codable children: IMO0001

## 2015-07-03 LAB — BASIC METABOLIC PANEL
ANION GAP: 11 (ref 5–15)
BUN: 15 mg/dL (ref 6–20)
CO2: 25 mmol/L (ref 22–32)
Calcium: 9 mg/dL (ref 8.9–10.3)
Chloride: 103 mmol/L (ref 101–111)
Creatinine, Ser: 0.94 mg/dL (ref 0.61–1.24)
GFR calc Af Amer: >60 mL/min (ref >60)
Glucose, Bld: 167 mg/dL — ABNORMAL HIGH (ref 65–99)
POTASSIUM: 3.8 mmol/L (ref 3.5–5.1)
SODIUM: 139 mmol/L (ref 135–145)

## 2015-07-03 LAB — BRAIN NATRIURETIC PEPTIDE: B NATRIURETIC PEPTIDE 5: 19.8 pg/mL (ref 0.0–100.0)

## 2015-07-03 LAB — CBC
HEMATOCRIT: 40.7 % (ref 39.0–52.0)
HEMOGLOBIN: 13.4 g/dL (ref 13.0–17.0)
MCH: 27.8 pg (ref 26.0–34.0)
MCHC: 32.9 g/dL (ref 30.0–36.0)
MCV: 84.4 fL (ref 78.0–100.0)
Platelets: 158 10*3/uL (ref 150–400)
RBC: 4.82 MIL/uL (ref 4.22–5.81)
RDW: 14 % (ref 11.5–15.5)
WBC: 6.2 10*3/uL (ref 4.0–10.5)

## 2015-07-03 LAB — I-STAT ARTERIAL BLOOD GAS, ED
Acid-base deficit: 3 mmol/L — ABNORMAL HIGH (ref 0.0–2.0)
Bicarbonate: 22.4 mEq/L (ref 20.0–24.0)
O2 SAT: 100 %
PCO2 ART: 38.9 mmHg (ref 35.0–45.0)
PH ART: 7.368 (ref 7.350–7.450)
Patient temperature: 98.6
TCO2: 24 mmol/L (ref 0–100)
pO2, Arterial: 195 mmHg — ABNORMAL HIGH (ref 80.0–100.0)

## 2015-07-03 LAB — I-STAT TROPONIN, ED: Troponin i, poc: 0.01 ng/mL (ref 0.00–0.08)

## 2015-07-03 MED ORDER — METHYLPREDNISOLONE SODIUM SUCC 125 MG IJ SOLR: 125.0000 mg | Freq: Once | INTRAMUSCULAR | Status: DC

## 2015-07-03 MED ORDER — ONDANSETRON HCL 4 MG/2ML IJ SOLN: 4.0000 mg | Freq: Four times a day (QID) | INTRAMUSCULAR | Status: DC | PRN

## 2015-07-03 MED ORDER — METHYLPREDNISOLONE SODIUM SUCC 125 MG IJ SOLR
60.0000 mg | Freq: Four times a day (QID) | INTRAMUSCULAR | Status: DC
  Administered 2015-07-04 (×2): 60 mg via INTRAVENOUS
  Filled 2015-07-03 (×2): qty 2

## 2015-07-03 MED ORDER — ACETAMINOPHEN 650 MG RE SUPP: 650.0000 mg | Freq: Four times a day (QID) | RECTAL | Status: DC | PRN

## 2015-07-03 MED ORDER — HYDROCODONE-ACETAMINOPHEN 5-325 MG PO TABS: 1.0000 | ORAL_TABLET | ORAL | Status: DC | PRN

## 2015-07-03 MED ORDER — LORAZEPAM 2 MG/ML IJ SOLN
0.5000 mg | Freq: Once | INTRAMUSCULAR | Status: AC
  Administered 2015-07-03: 0.5 mg via INTRAVENOUS
  Filled 2015-07-03: qty 1

## 2015-07-03 MED ORDER — IPRATROPIUM-ALBUTEROL 0.5-2.5 (3) MG/3ML IN SOLN
3.0000 mL | Freq: Four times a day (QID) | RESPIRATORY_TRACT | Status: DC
  Administered 2015-07-04 – 2015-07-05 (×6): 3 mL via RESPIRATORY_TRACT
  Filled 2015-07-03 (×6): qty 3

## 2015-07-03 MED ORDER — ALBUTEROL SULFATE (2.5 MG/3ML) 0.083% IN NEBU
5.0000 mg | INHALATION_SOLUTION | Freq: Once | RESPIRATORY_TRACT | Status: AC
  Administered 2015-07-03: 5 mg via RESPIRATORY_TRACT

## 2015-07-03 MED ORDER — IPRATROPIUM-ALBUTEROL 0.5-2.5 (3) MG/3ML IN SOLN: 3.0000 mL | RESPIRATORY_TRACT | Status: DC | PRN

## 2015-07-03 MED ORDER — METHYLPREDNISOLONE SODIUM SUCC 125 MG IJ SOLR
INTRAMUSCULAR | Status: AC
  Filled 2015-07-03: qty 2

## 2015-07-03 MED ORDER — BUDESONIDE-FORMOTEROL FUMARATE 160-4.5 MCG/ACT IN AERO
2.0000 | INHALATION_SPRAY | Freq: Two times a day (BID) | RESPIRATORY_TRACT | Status: DC
  Administered 2015-07-04 – 2015-07-05 (×2): 2 via RESPIRATORY_TRACT
  Filled 2015-07-03: qty 6

## 2015-07-03 MED ORDER — NITROGLYCERIN 0.4 MG SL SUBL: 0.4000 mg | SUBLINGUAL_TABLET | SUBLINGUAL | Status: DC | PRN

## 2015-07-03 MED ORDER — ENOXAPARIN SODIUM 40 MG/0.4ML ~~LOC~~ SOLN
40.0000 mg | SUBCUTANEOUS | Status: DC
  Administered 2015-07-04: 40 mg via SUBCUTANEOUS
  Filled 2015-07-03 (×2): qty 0.4

## 2015-07-03 MED ORDER — SODIUM CHLORIDE 0.9 % IV SOLN
Freq: Once | INTRAVENOUS | Status: AC
  Administered 2015-07-03: 18:00:00 via INTRAVENOUS

## 2015-07-03 MED ORDER — ONDANSETRON HCL 4 MG PO TABS: 4.0000 mg | ORAL_TABLET | Freq: Four times a day (QID) | ORAL | Status: DC | PRN

## 2015-07-03 MED ORDER — ACETAMINOPHEN 325 MG PO TABS: 650.0000 mg | ORAL_TABLET | Freq: Four times a day (QID) | ORAL | Status: DC | PRN

## 2015-07-03 MED ORDER — BISACODYL 5 MG PO TBEC
5.0000 mg | DELAYED_RELEASE_TABLET | Freq: Every day | ORAL | Status: DC | PRN
  Filled 2015-07-03: qty 1

## 2015-07-03 MED ORDER — ROSUVASTATIN CALCIUM 20 MG PO TABS
20.0000 mg | ORAL_TABLET | Freq: Every day | ORAL | Status: DC
  Administered 2015-07-04 (×2): 20 mg via ORAL
  Filled 2015-07-03 (×3): qty 1

## 2015-07-03 MED ORDER — ALBUTEROL SULFATE (2.5 MG/3ML) 0.083% IN NEBU
INHALATION_SOLUTION | RESPIRATORY_TRACT | Status: AC
  Filled 2015-07-03: qty 6

## 2015-07-03 MED ORDER — TIOTROPIUM BROMIDE MONOHYDRATE 18 MCG IN CAPS
18.0000 ug | ORAL_CAPSULE | Freq: Every day | RESPIRATORY_TRACT | Status: DC
  Administered 2015-07-05: 18 ug via RESPIRATORY_TRACT
  Filled 2015-07-03: qty 5

## 2015-07-03 MED ORDER — ASPIRIN EC 81 MG PO TBEC
81.0000 mg | DELAYED_RELEASE_TABLET | Freq: Every day | ORAL | Status: DC
  Administered 2015-07-04 – 2015-07-05 (×2): 81 mg via ORAL
  Filled 2015-07-03 (×2): qty 1

## 2015-07-03 MED ORDER — SENNOSIDES-DOCUSATE SODIUM 8.6-50 MG PO TABS
1.0000 | ORAL_TABLET | Freq: Every evening | ORAL | Status: DC | PRN
  Filled 2015-07-03: qty 1

## 2015-07-03 MED ORDER — POTASSIUM CHLORIDE IN NACL 20-0.9 MEQ/L-% IV SOLN
INTRAVENOUS | Status: DC
  Administered 2015-07-04: 01:00:00 via INTRAVENOUS
  Filled 2015-07-03: qty 1000

## 2015-07-03 MED ORDER — METHYLPREDNISOLONE SODIUM SUCC 125 MG IJ SOLR
125.0000 mg | Freq: Once | INTRAMUSCULAR | Status: AC
  Administered 2015-07-03: 125 mg via INTRAVENOUS

## 2015-07-03 MED ORDER — LEVOFLOXACIN IN D5W 500 MG/100ML IV SOLN
500.0000 mg | Freq: Every day | INTRAVENOUS | Status: DC
Start: 2015-07-03 — End: 2015-07-05
  Administered 2015-07-04 (×2): 500 mg via INTRAVENOUS
  Filled 2015-07-03 (×3): qty 100

## 2015-07-03 MED ORDER — ALBUTEROL (5 MG/ML) CONTINUOUS INHALATION SOLN
10.0000 mg/h | INHALATION_SOLUTION | Freq: Once | RESPIRATORY_TRACT | Status: AC
  Administered 2015-07-03: 10 mg/h via RESPIRATORY_TRACT
  Filled 2015-07-03: qty 20

## 2015-07-03 MED ORDER — SODIUM CHLORIDE 0.9 % IJ SOLN
3.0000 mL | Freq: Two times a day (BID) | INTRAMUSCULAR | Status: DC
  Administered 2015-07-04 (×2): 3 mL via INTRAVENOUS

## 2015-07-03 MED ORDER — IPRATROPIUM BROMIDE 0.02 % IN SOLN
0.5000 mg | Freq: Once | RESPIRATORY_TRACT | Status: AC
  Administered 2015-07-03: 0.5 mg via RESPIRATORY_TRACT

## 2015-07-03 MED ORDER — CLOPIDOGREL BISULFATE 75 MG PO TABS
75.0000 mg | ORAL_TABLET | Freq: Every day | ORAL | Status: DC
  Administered 2015-07-04 – 2015-07-05 (×2): 75 mg via ORAL
  Filled 2015-07-03 (×2): qty 1

## 2015-07-03 NOTE — ED Notes (Signed)
IV  NS  100  ML  /  HR  VIA  18  ANGIO   L   FOREARM      SITE  IS  PATENT

## 2015-07-03 NOTE — H&P (Signed)
[facility] History and Physical  AMIN FORNWALT ZOX:[id number] DOB: 1946-03-13 DOA: 07/03/2015  Referring physician: ED physician PCP: Eartha Inch, MD  Specialists: Dr. Jamison Neighbor, pulmonology   Chief Complaint:   SOB   HPI: John Marsh is a 69 y.o. male with PMH of COPD on home O2 at night, hypertension, CAD, and ongoing tobacco abuse who presents to the ED via urgent care with acutely worsening dyspnea of one days duration.  Patient reports that up until the day of admission he had been in his usual state of health without fevers, chills, chest pains, palpitations, orthopnea, or leg swelling. He describes a cough productive of white sputum at baseline and reports that this has not worsened significantly. He reports continued adherence with his home respiratory treatments and stable chronic SOB up until today. He noted difficulty breathing on the morning of admission and states that it worsened progressively over the course of several hours until he felt like he could barely catch his breath and drove himself to urgent care. He received a nebulized breathing treatment and dose of IV Solu-Medrol at the urgent care, and while he experienced some mild relief in his symptoms, he continued to be in significant respiratory distress and was transported to the emergency department.  In ED, patient was found to be afebrile and saturating well on 2 L/m nasal cannula, though with tachypnea. Heart rate was in the normal range initially but rose to the 120s following an hour-long neb treatment. Chest x-ray re-demonstrated stable chronic findings associated with emphysema as well as stable small nodular opacities in the LUL. Basic blood work featured elevated serum glucose but was otherwise unremarkable. EKG featured a sinus tachycardia with supraventricular bigeminy, troponin was 0.01, and BNP was 20. Patient experienced mild relief in symptoms with nebulized breathing treatment but continued to demonstrate  moderate respiratory distress and was placed on BiPAP in the ED. Patient remained stable on BiPAP and was admitted to the hospital for ongoing evaluation and management of suspected acute exacerbation of COPD.  Where does patient live?   At home     Can patient participate in ADLs?  Yes        Review of Systems:   General: no fevers, chills, sweats, weight change, poor appetite, or fatigue HEENT: no blurry vision, hearing changes or sore throat Pulm: Chronic productive cough, acute on chronic SOB, no wheeze CV: no chest pain or palpitations Abd: no nausea, vomiting, abdominal pain, diarrhea, or constipation GU: no dysuria, hematuria, increased urinary frequency, or urgency  Ext: no leg edema Neuro: no focal weakness, numbness, or tingling, no vision change or hearing loss Skin: no rash, no wounds MSK: No muscle spasm, no deformity, no red, hot, or swollen joint. Muscle cramps  Heme: No easy bruising or bleeding Travel history: No recent long distant travel    Allergy: No Known Allergies  Past Medical History  Diagnosis Date  . Hypertension   . COPD (chronic obstructive pulmonary disease) (HCC)   . CHF (congestive heart failure) (HCC)   . MI (myocardial infarction) (HCC)   . Hyperlipidemia   . Emphysema of lung (HCC)   . Nocturnal hypoxia   . Multiple lung nodules on CT     Past Surgical History  Procedure Laterality Date  . Stent placed    . Cholecystectomy    . Nasal septum surgery      Social History:  reports that he has been smoking Cigarettes.  He started smoking about 55 years ago.  He has a 165 pack-year smoking history. He does not have any smokeless tobacco history on file. He reports that he does not drink alcohol or use illicit drugs.  Family History:  Family History  Problem Relation Age of Onset  . Heart disease Mother     died at 19  . Heart disease Father   . Heart disease Brother   . Heart disease Sister   . Lung disease Neg Hx      Prior to  Admission medications   Medication Sig Start Date End Date Taking? Authorizing Provider  aspirin EC 81 MG tablet Take 81 mg by mouth daily.   Yes Historical Provider, MD  budesonide-formoterol (SYMBICORT) 160-4.5 MCG/ACT inhaler Inhale 2 puffs into the lungs 2 (two) times daily.   Yes Historical Provider, MD  clopidogrel (PLAVIX) 75 MG tablet Take 75 mg by mouth daily.   Yes Historical Provider, MD  rosuvastatin (CRESTOR) 20 MG tablet Take 20 mg by mouth at bedtime.   Yes Historical Provider, MD  tiotropium (SPIRIVA) 18 MCG inhalation capsule Place 18 mcg into inhaler and inhale daily.   Yes Historical Provider, MD  albuterol (PROVENTIL HFA;VENTOLIN HFA) 108 (90 BASE) MCG/ACT inhaler Inhale 2 puffs into the lungs every 6 (six) hours as needed for wheezing or shortness of breath.    Historical Provider, MD  montelukast (SINGULAIR) 5 MG chewable tablet Chew 1 tablet (5 mg total) by mouth at bedtime. Patient not taking: Reported on 07/03/2015 03/04/15   Roslynn Amble, MD  nitroGLYCERIN (NITROSTAT) 0.4 MG SL tablet Place 0.4 mg under the tongue every 5 (five) minutes as needed for chest pain. x3 doses as needed for chest pain    Historical Provider, MD  Spacer/Aero-Holding Chambers (AEROCHAMBER Z-STAT PLUS) inhaler Use as instructed 03/04/15   Roslynn Amble, MD    Physical Exam: Filed Vitals:   07/03/15 2133 07/03/15 2200 07/03/15 2220 07/03/15 2230  BP: 142/48 162/82 162/82 122/65  Pulse: 108 139 170 105  Resp: 25 28 22 22   SpO2: 100% 98% 100% 100%   General: In moderate respiratory distress on BiPAP with accessory muscle recruitment  HEENT:       Eyes: PERRL, EOMI, no scleral icterus or conjunctival pallor.       ENT: No discharge from the ears or nose, no pharyngeal ulcers, petechiae or exudate, no tonsillar enlargement.        Neck: No JVD, no bruit, no appreciable mass Heme: No cervical adenopathy, no pallor Cardiac: Rate ~100 and regular, No murmurs, No gallops or rubs. Pulm:   Symmetric chest wall expansion, markedly diminished breath sounds b/l, prolonged expiratory phase, no pallor or cyanosis  Abd: Soft, nondistended, nontender, no rebound pain or gaurding, no mass or organomegaly, BS present. Ext: No LE edema bilaterally. 1+DP/PT pulse bilaterally. Musculoskeletal: No gross deformity, no red, hot, swollen joints, no limitation in ROM  Skin: No rashes or wounds on exposed surfaces  Neuro: Alert, oriented X3, cranial nerves II-XII grossly intact. No focal findings Psych: Patient is not overtly psychotic, appropriate mood and affect.  Labs on Admission:  Basic Metabolic Panel:  Recent Labs Lab 07/03/15 2022  NA 139  K 3.8  CL 103  CO2 25  GLUCOSE 167*  BUN 15  CREATININE 0.94  CALCIUM 9.0   Liver Function Tests: No results for input(s): AST, ALT, ALKPHOS, BILITOT, PROT, ALBUMIN in the last 168 hours. No results for input(s): LIPASE, AMYLASE in the last 168 hours. No results for input(s): AMMONIA  in the last 168 hours. CBC:  Recent Labs Lab 07/03/15 2022  WBC 6.2  HGB 13.4  HCT 40.7  MCV 84.4  PLT 158   Cardiac Enzymes: No results for input(s): CKTOTAL, CKMB, CKMBINDEX, TROPONINI in the last 168 hours.  BNP (last 3 results)  Recent Labs  07/03/15 2022  BNP 19.8    ProBNP (last 3 results) No results for input(s): PROBNP in the last 8760 hours.  CBG: No results for input(s): GLUCAP in the last 168 hours.  Radiological Exams on Admission: Dg Chest 2 View  07/03/2015  CLINICAL DATA:  Shortness of breath.  History of COPD EXAM: CHEST  2 VIEW COMPARISON:  July 03, 2015 study obtained earlier in the day as well as chest CT January 19, 2015 FINDINGS: Lungs remain hyperexpanded with scarring in the lateral left base. The interstitium is diffusely prominent with multiple small nodular opacities, stable. There is bullous disease in the apices, stable. There is no edema or consolidation. The heart size is normal. The pulmonary vascularity  reflects the underlying emphysematous type change. No adenopathy is apparent. There are no appreciable bone lesions. IMPRESSION: No appreciable change compared to study obtained earlier in the day. Underlying COPD with areas of scarring. Small nodular opacities in the left upper lobe are stable. No new opacity. No change in cardiac silhouette. Electronically Signed   By: Bretta BangWilliam  Woodruff III M.D.   On: 07/03/2015 20:55   Dg Chest 2 View  07/03/2015  CLINICAL DATA:  Acute onset of shortness of breath and wheezing. COPD exacerbation. Status post administration of Solu-Medrol. Initial encounter. EXAM: CHEST  2 VIEW COMPARISON:  Chest radiograph performed 08/08/2012, and CT of the chest performed 01/19/2015 FINDINGS: The lungs are hyperexpanded, with flattening of the hemidiaphragms, reflecting the patient's COPD. Scattered bilateral pulmonary nodules are similar in appearance to the prior CT, with a vague ground-glass opacity again noted at the right lung apex. Mild peribronchial thickening is noted. No superimposed focal airspace consolidation is seen at this time. No pleural effusion or pneumothorax is identified. The heart is normal in size. No acute osseous abnormalities are seen. IMPRESSION: Findings of COPD, with scattered bilateral pulmonary nodules and right apical ground-glass opacity, similar in appearance to the prior CT. No acute focal airspace consolidation seen. Electronically Signed   By: Roanna RaiderJeffery  Chang M.D.   On: 07/03/2015 18:56    EKG: Independently reviewed.  Abnormal findings:    Sinus tachycardia, supraventricular bigeminy    Assessment/Plan  1. COPD with acute exacerbation  - Uncertain precipitant, possibly infectious  - Continue BiPAP prn, wean as tolerated  - DuoNebs scheduled q6h and available prn q2h  - Systemic glucocorticoids with IV Solu-Medrol 60 mg q6h, will convert to PO and taper as appropriate based on clinical progress  - Levaquin 500 mg q24h, planning tentatively for  7-day course  - Continuous pulse oximetry with supplemental O2 titrated to maintain sats in low 90s - Continue Spiriva - Monitor in step-down unit while on BiPAP   2. Tachyarrhythmia  - Likely drug-induced as developed in association with albuterol neb  - Improving  - No chest pain or palpitations  - Monitor on telemetry, repeat EKG prn rhythm change or chest pain    3. Tobacco dependence  - Pt has successfully cut back to 5-10 cigarettes per day - Counseled on importance of cessation, pt agrees, though does not anticipate cutting back further in the near future  - RN to provide smoking cessation information prior to discharge  -  Nicotine patch if needed   4. Hypertension  - Normotensive currently  - Monitoring, will start antihypertensive if needed    5. CAD - Seems stable - No CP, palpitations, or edema  - Continue Plavix, ASA 81, Crestor  - Trop 0.01 in ED, BNP 20  - Monitoring on telemetry     DVT ppx: SQ Lovenox      Code Status: Full code Family Communication: None at bed side.          Disposition Plan: Admit to inpatient   Date of Service 07/03/2015    Briscoe Deutscher, MD [facility] Pager [phone]  If 7PM-7AM, please contact night-coverage [email or web address] Password [facility] 07/03/2015, 11:22 PM

## 2015-07-03 NOTE — ED Provider Notes (Signed)
CSN: [id number]     Arrival date & time 07/03/15  1951 History   First MD Initiated Contact with Patient 07/03/15 1958     Chief Complaint  Patient presents with  . Shortness of Breath   HPI Patient has a history of COPD. He has been smoking for many years and has been trying to quit but still smokes about half pack per day. Patient is normally on oxygen at night. He uses inhalers at home but today he had acute onset of shortness of breath. He felt like he couldn't get a full breath in. Patient went to an urgent care and was treated with Solu-Medrol and an albuterol treatment. Feels like the symptoms are slightly better but he was transferred to the emergency room for continued treatment. Since had some fleeting episodes of sharp pains on both sides of his chest. Denies any trouble with fevers. No leg swelling.  Past Medical History  Diagnosis Date  . Hypertension   . COPD (chronic obstructive pulmonary disease) (HCC)   . CHF (congestive heart failure) (HCC)   . MI (myocardial infarction) (HCC)   . Hyperlipidemia   . Emphysema of lung (HCC)   . Nocturnal hypoxia   . Multiple lung nodules on CT    Past Surgical History  Procedure Laterality Date  . Stent placed    . Cholecystectomy    . Nasal septum surgery     Family History  Problem Relation Age of Onset  . Heart disease Mother     died at 1957  . Heart disease Father   . Heart disease Brother   . Heart disease Sister   . Lung disease Neg Hx    Social History  Substance Use Topics  . Smoking status: Current Every Day Smoker -- 3.00 packs/day for 55 years    Types: Cigarettes    Start date: 08/01/1959  . Smokeless tobacco: None     Comment: 1/2 ppd 06/03/15 - peak 3ppd  . Alcohol Use: No    Review of Systems  All other systems reviewed and are negative.     Allergies  Review of patient's allergies indicates no known allergies.  Home Medications   Prior to Admission medications   Medication Sig Start Date End  Date Taking? Authorizing Provider  aspirin EC 81 MG tablet Take 81 mg by mouth daily.   Yes Historical Provider, MD  budesonide-formoterol (SYMBICORT) 160-4.5 MCG/ACT inhaler Inhale 2 puffs into the lungs 2 (two) times daily.   Yes Historical Provider, MD  clopidogrel (PLAVIX) 75 MG tablet Take 75 mg by mouth daily.   Yes Historical Provider, MD  rosuvastatin (CRESTOR) 20 MG tablet Take 20 mg by mouth at bedtime.   Yes Historical Provider, MD  tiotropium (SPIRIVA) 18 MCG inhalation capsule Place 18 mcg into inhaler and inhale daily.   Yes Historical Provider, MD  albuterol (PROVENTIL HFA;VENTOLIN HFA) 108 (90 BASE) MCG/ACT inhaler Inhale 2 puffs into the lungs every 6 (six) hours as needed for wheezing or shortness of breath.    Historical Provider, MD  montelukast (SINGULAIR) 5 MG chewable tablet Chew 1 tablet (5 mg total) by mouth at bedtime. Patient not taking: Reported on 07/03/2015 03/04/15   Roslynn AmbleJennings E Nestor, MD  nitroGLYCERIN (NITROSTAT) 0.4 MG SL tablet Place 0.4 mg under the tongue every 5 (five) minutes as needed for chest pain. x3 doses as needed for chest pain    Historical Provider, MD  Spacer/Aero-Holding Chambers (AEROCHAMBER Z-STAT PLUS) inhaler Use as instructed 03/04/15  Roslynn Amble, MD   BP 122/65 mmHg  Pulse 105  Resp 22  SpO2 100% Physical Exam  Constitutional: He appears well-developed and well-nourished. No distress.  HENT:  Head: Normocephalic and atraumatic.  Right Ear: External ear normal.  Left Ear: External ear normal.  Eyes: Conjunctivae are normal. Right eye exhibits no discharge. Left eye exhibits no discharge. No scleral icterus.  Neck: Neck supple. No tracheal deviation present.  Cardiovascular: Normal rate, regular rhythm and intact distal pulses.   Pulmonary/Chest: Effort normal. No stridor. No respiratory distress. He has wheezes. He has no rales.  Distant breath sounds with decreased air movement, few wheezes heard on end expiration  Abdominal:  Soft. Bowel sounds are normal. He exhibits no distension. There is no tenderness. There is no rebound and no guarding.  Musculoskeletal: He exhibits no edema or tenderness.  Neurological: He is alert. He has normal strength. No cranial nerve deficit (no facial droop, extraocular movements intact, no slurred speech) or sensory deficit. He exhibits normal muscle tone. He displays no seizure activity. Coordination normal.  Skin: Skin is warm and dry. No rash noted.  Psychiatric: He has a normal mood and affect.  Nursing note and vitals reviewed.   ED Course  Procedures (including critical care time) Labs Review Labs Reviewed  BASIC METABOLIC PANEL - Abnormal; Notable for the following:    Glucose, Bld 167 (*)    All other components within normal limits  I-STAT ARTERIAL BLOOD GAS, ED - Abnormal; Notable for the following:    pO2, Arterial 195.0 (*)    Acid-base deficit 3.0 (*)    All other components within normal limits  CBC  BRAIN NATRIURETIC PEPTIDE  BLOOD GAS, ARTERIAL  I-STAT TROPOININ, ED    Imaging Review Dg Chest 2 View  07/03/2015  CLINICAL DATA:  Shortness of breath.  History of COPD EXAM: CHEST  2 VIEW COMPARISON:  July 03, 2015 study obtained earlier in the day as well as chest CT January 19, 2015 FINDINGS: Lungs remain hyperexpanded with scarring in the lateral left base. The interstitium is diffusely prominent with multiple small nodular opacities, stable. There is bullous disease in the apices, stable. There is no edema or consolidation. The heart size is normal. The pulmonary vascularity reflects the underlying emphysematous type change. No adenopathy is apparent. There are no appreciable bone lesions. IMPRESSION: No appreciable change compared to study obtained earlier in the day. Underlying COPD with areas of scarring. Small nodular opacities in the left upper lobe are stable. No new opacity. No change in cardiac silhouette. Electronically Signed   By: Bretta Bang III  M.D.   On: 07/03/2015 20:55   Dg Chest 2 View  07/03/2015  CLINICAL DATA:  Acute onset of shortness of breath and wheezing. COPD exacerbation. Status post administration of Solu-Medrol. Initial encounter. EXAM: CHEST  2 VIEW COMPARISON:  Chest radiograph performed 08/08/2012, and CT of the chest performed 01/19/2015 FINDINGS: The lungs are hyperexpanded, with flattening of the hemidiaphragms, reflecting the patient's COPD. Scattered bilateral pulmonary nodules are similar in appearance to the prior CT, with a vague ground-glass opacity again noted at the right lung apex. Mild peribronchial thickening is noted. No superimposed focal airspace consolidation is seen at this time. No pleural effusion or pneumothorax is identified. The heart is normal in size. No acute osseous abnormalities are seen. IMPRESSION: Findings of COPD, with scattered bilateral pulmonary nodules and right apical ground-glass opacity, similar in appearance to the prior CT. No acute focal airspace consolidation  seen. Electronically Signed   By: Roanna Raider M.D.   On: 07/03/2015 18:56   I have personally reviewed and evaluated these images and lab results as part of my medical decision-making.  Patient had an EKG performed at the urgent care. It showed normal sinus rhythm with rate 85. No evidence of acute ischemia  2156  Pt feels a little better however he still appears to be having increased work of breathing.  Tachycardic now as well after the hour long neb.  Will recheck his EKG.  Start bipap and check an ABG.  Plan on admission.  2236  Work of breathing is decreasing now that he is on bipap.  Heart rate is also decreasing.    MDM   Final diagnoses:  COPD exacerbation (HCC)    Pt presented to the ED with a persistent copd exacerbation.  He developed tachycardia in response to his albuterol treatment but had continued increase work of breathing.  Bipap started for respiratory support.  Pt is showing a good response.  ABG is  reassuring.  No hypoxia, no hypercarbia.  No evidence of CHF or cardiac ischemia.  Plan on admission to stepdown.    Linwood Dibbles, MD 07/03/15 2240

## 2015-07-03 NOTE — ED Notes (Signed)
Patient placed on O2 at 2 lpm via N/C per verbal order from Kindl, MD. Patient stated that he feels weak and shaky.

## 2015-07-03 NOTE — ED Notes (Signed)
Per [facility]- pt has hx of respiratory disorders. Last several hours pt had dyspnea. Taken to urgent care given solumedrol and one duoneb and one albuterol treatment. No acute distress.

## 2015-07-03 NOTE — ED Provider Notes (Addendum)
CSN: [id number]     Arrival date & time 07/03/15  1806 History   None    Chief Complaint  Patient presents with  . Shortness of Breath   (Consider location/radiation/quality/duration/timing/severity/associated sxs/prior Treatment) Patient is a 70 y.o. male presenting with shortness of breath. The history is provided by the patient.  Shortness of Breath Severity:  Severe Onset quality:  Sudden Duration:  4 hours Progression:  Worsening Chronicity:  New (denies any pain) Context comment:  Smoker with copd, smoking 1/2 ppd. Relieved by:  None tried Worsened by:  Nothing tried Ineffective treatments:  None tried Associated symptoms: cough and wheezing   Associated symptoms: no abdominal pain, no chest pain and no fever   Risk factors: tobacco use     Past Medical History  Diagnosis Date  . Hypertension   . COPD (chronic obstructive pulmonary disease) (HCC)   . CHF (congestive heart failure) (HCC)   . MI (myocardial infarction) (HCC)   . Hyperlipidemia   . Emphysema of lung (HCC)   . Nocturnal hypoxia   . Multiple lung nodules on CT    Past Surgical History  Procedure Laterality Date  . Stent placed    . Cholecystectomy    . Nasal septum surgery     Family History  Problem Relation Age of Onset  . Heart disease Mother     died at 35  . Heart disease Father   . Heart disease Brother   . Heart disease Sister   . Lung disease Neg Hx    Social History  Substance Use Topics  . Smoking status: Current Every Day Smoker -- 3.00 packs/day for 55 years    Types: Cigarettes    Start date: 08/01/1959  . Smokeless tobacco: None     Comment: 1/2 ppd 06/03/15 - peak 3ppd  . Alcohol Use: No    Review of Systems  Constitutional: Negative.  Negative for fever.  HENT: Negative.   Respiratory: Positive for cough, chest tightness, shortness of breath and wheezing.   Cardiovascular: Negative for chest pain.  Gastrointestinal: Negative for abdominal pain.  All other systems  reviewed and are negative.   Allergies  Review of patient's allergies indicates no known allergies.  Home Medications   Prior to Admission medications   Medication Sig Start Date End Date Taking? Authorizing Provider  albuterol (PROVENTIL HFA;VENTOLIN HFA) 108 (90 BASE) MCG/ACT inhaler Inhale 2 puffs into the lungs every 6 (six) hours as needed for wheezing or shortness of breath.    Historical Provider, MD  aspirin EC 81 MG tablet Take 81 mg by mouth daily.    Historical Provider, MD  budesonide-formoterol (SYMBICORT) 160-4.5 MCG/ACT inhaler Inhale 2 puffs into the lungs 2 (two) times daily.    Historical Provider, MD  clopidogrel (PLAVIX) 75 MG tablet Take 75 mg by mouth daily.    Historical Provider, MD  hydrochlorothiazide (HYDRODIURIL) 25 MG tablet Take 25 mg by mouth. Once daily 07/09/14 07/09/15  Historical Provider, MD  montelukast (SINGULAIR) 5 MG chewable tablet Chew 1 tablet (5 mg total) by mouth at bedtime. 03/04/15   Roslynn Amble, MD  nitroGLYCERIN (NITROSTAT) 0.4 MG SL tablet Place 0.4 mg under the tongue every 5 (five) minutes as needed for chest pain. x3 doses as needed for chest pain    Historical Provider, MD  rosuvastatin (CRESTOR) 20 MG tablet Take 20 mg by mouth at bedtime.    Historical Provider, MD  Spacer/Aero-Holding Chambers (AEROCHAMBER Z-STAT PLUS) inhaler Use as instructed 03/04/15  Roslynn AmbleJennings E Nestor, MD  tiotropium (SPIRIVA) 18 MCG inhalation capsule Place 18 mcg into inhaler and inhale daily.    Historical Provider, MD   Meds Ordered and Administered this Visit   Medications  albuterol (PROVENTIL) (2.5 MG/3ML) 0.083% nebulizer solution 5 mg (5 mg Nebulization Given 07/03/15 1829)  ipratropium (ATROVENT) nebulizer solution 0.5 mg (0.5 mg Nebulization Given 07/03/15 1829)  methylPREDNISolone sodium succinate (SOLU-MEDROL) 125 mg/2 mL injection 125 mg (125 mg Intravenous Given 07/03/15 1829)  0.9 %  sodium chloride infusion ( Intravenous New Bag/Given 07/03/15  1829)  albuterol (PROVENTIL) (2.5 MG/3ML) 0.083% nebulizer solution 5 mg (5 mg Nebulization Given 07/03/15 1926)    BP 201/85 mmHg  Pulse 94  Temp(Src) 98.3 F (36.8 C) (Oral)  Resp 26  SpO2 98% No data found.   Physical Exam  Constitutional: He is oriented to person, place, and time. He appears well-developed and well-nourished. He appears distressed.  Neck: Normal range of motion. Neck supple.  Cardiovascular: Normal rate, normal heart sounds and intact distal pulses.   Pulmonary/Chest: He is in respiratory distress. He has wheezes.  Neurological: He is alert and oriented to person, place, and time.  Skin: Skin is warm and dry.  Nursing note and vitals reviewed.   ED Course  Procedures (including critical care time)  Labs Review Labs Reviewed - No data to display  Imaging Review Dg Chest 2 View  07/03/2015  CLINICAL DATA:  Acute onset of shortness of breath and wheezing. COPD exacerbation. Status post administration of Solu-Medrol. Initial encounter. EXAM: CHEST  2 VIEW COMPARISON:  Chest radiograph performed 08/08/2012, and CT of the chest performed 01/19/2015 FINDINGS: The lungs are hyperexpanded, with flattening of the hemidiaphragms, reflecting the patient's COPD. Scattered bilateral pulmonary nodules are similar in appearance to the prior CT, with a vague ground-glass opacity again noted at the right lung apex. Mild peribronchial thickening is noted. No superimposed focal airspace consolidation is seen at this time. No pleural effusion or pneumothorax is identified. The heart is normal in size. No acute osseous abnormalities are seen. IMPRESSION: Findings of COPD, with scattered bilateral pulmonary nodules and right apical ground-glass opacity, similar in appearance to the prior CT. No acute focal airspace consolidation seen. Electronically Signed   By: Roanna RaiderJeffery  Chang M.D.   On: 07/03/2015 18:56     Visual Acuity Review  Right Eye Distance:   Left Eye Distance:    Bilateral Distance:    Right Eye Near:   Left Eye Near:    Bilateral Near:     ED ECG REPORT   Date: 07/03/2015  Rate: 85  Rhythm: normal sinus rhythm  QRS Axis: normal  Intervals: normal  ST/T Wave abnormalities: normal  Conduction Disutrbances:none  Narrative Interpretation: normal.  Old EKG Reviewed: none available  I have personally reviewed the EKG tracing and agree with the computerized printout as noted.      MDM   1. Acute dyspnea    Sent for eval of acute sob, ecg wnl, no acute cxr changes., o2 sat 98%. Sx "right much better"after neb rx.    Linna HoffJames D Kindl, MD 07/03/15 [id number]  Linna HoffJames D Kindl, MD 07/03/15 [phone]

## 2015-07-03 NOTE — ED Notes (Signed)
Gave report to [employer]ie[employer]ie RN.

## 2015-07-03 NOTE — ED Notes (Signed)
SOLU MEDROL  WAS  GIVEN  IV

## 2015-07-03 NOTE — ED Notes (Signed)
[facility] has been called. 

## 2015-07-03 NOTE — ED Notes (Signed)
Pt returned from xray, cont neb re-started.

## 2015-07-03 NOTE — ED Notes (Signed)
Py reports shortness  Of  Breath  X  3-4  Hours  Pt   Smoker          With  Copd

## 2015-07-03 NOTE — ED Notes (Signed)
Pt went to xray, cont neb stopped and will resume when pt returns.

## 2015-07-03 NOTE — ED Notes (Signed)
Pt on  Cardiac  Monitor  And  Nasal  o2  At  2  l  /  Min   Iv     Infusing  Well  Site  patent

## 2015-07-04 ENCOUNTER — Encounter (HOSPITAL_COMMUNITY): Payer: Self-pay | Admitting: *Deleted

## 2015-07-04 DIAGNOSIS — R000 Tachycardia, unspecified: Secondary | ICD-10-CM

## 2015-07-04 DIAGNOSIS — J9621 Acute and chronic respiratory failure with hypoxia: Secondary | ICD-10-CM

## 2015-07-04 DIAGNOSIS — F17200 Nicotine dependence, unspecified, uncomplicated: Secondary | ICD-10-CM

## 2015-07-04 DIAGNOSIS — J441 Chronic obstructive pulmonary disease with (acute) exacerbation: Principal | ICD-10-CM

## 2015-07-04 DIAGNOSIS — I2510 Atherosclerotic heart disease of native coronary artery without angina pectoris: Secondary | ICD-10-CM

## 2015-07-04 LAB — GLUCOSE, CAPILLARY
GLUCOSE-CAPILLARY: 161 mg/dL — AB (ref 65–99)
GLUCOSE-CAPILLARY: 115 mg/dL — AB (ref 65–99)
Glucose-Capillary: 214 mg/dL — ABNORMAL HIGH (ref 65–99)
Glucose-Capillary: 125 mg/dL — ABNORMAL HIGH (ref 65–99)

## 2015-07-04 LAB — MRSA PCR SCREENING: MRSA BY PCR: NEGATIVE

## 2015-07-04 LAB — BASIC METABOLIC PANEL
ANION GAP: 14 (ref 5–15)
Anion gap: 16 — ABNORMAL HIGH (ref 5–15)
BUN: 16 mg/dL (ref 6–20)
BUN: 16 mg/dL (ref 6–20)
CHLORIDE: 103 mmol/L (ref 101–111)
CO2: 21 mmol/L — AB (ref 22–32)
CO2: 21 mmol/L — AB (ref 22–32)
CREATININE: 1.22 mg/dL (ref 0.61–1.24)
Calcium: 8.7 mg/dL — ABNORMAL LOW (ref 8.9–10.3)
Calcium: 8.9 mg/dL (ref 8.9–10.3)
Chloride: 105 mmol/L (ref 101–111)
Creatinine, Ser: 1.15 mg/dL (ref 0.61–1.24)
GFR calc Af Amer: >60 mL/min (ref >60)
GFR calc Af Amer: >60 mL/min (ref >60)
GFR calc non Af Amer: 58 mL/min — ABNORMAL LOW (ref >60)
GLUCOSE: 216 mg/dL — AB (ref 65–99)
GLUCOSE: 316 mg/dL — AB (ref 65–99)
POTASSIUM: 3.6 mmol/L (ref 3.5–5.1)
Potassium: 2.6 mmol/L — CL (ref 3.5–5.1)
Sodium: 140 mmol/L (ref 135–145)
Sodium: 140 mmol/L (ref 135–145)

## 2015-07-04 LAB — PROCALCITONIN: Procalcitonin: <0.1 ng/mL

## 2015-07-04 LAB — MAGNESIUM: Magnesium: 2 mg/dL (ref 1.7–2.4)

## 2015-07-04 MED ORDER — METHYLPREDNISOLONE SODIUM SUCC 125 MG IJ SOLR
60.0000 mg | Freq: Two times a day (BID) | INTRAMUSCULAR | Status: DC
  Administered 2015-07-04 – 2015-07-05 (×2): 60 mg via INTRAVENOUS
  Filled 2015-07-04 (×2): qty 2

## 2015-07-04 MED ORDER — MAGNESIUM SULFATE IN D5W 10-5 MG/ML-% IV SOLN
1.0000 g | Freq: Once | INTRAVENOUS | Status: AC
  Administered 2015-07-04: 1 g via INTRAVENOUS
  Filled 2015-07-04: qty 100

## 2015-07-04 MED ORDER — POTASSIUM CHLORIDE CRYS ER 20 MEQ PO TBCR
20.0000 meq | EXTENDED_RELEASE_TABLET | Freq: Two times a day (BID) | ORAL | Status: DC
  Administered 2015-07-04 – 2015-07-05 (×4): 20 meq via ORAL
  Filled 2015-07-04 (×4): qty 1

## 2015-07-04 MED ORDER — INSULIN ASPART 100 UNIT/ML ~~LOC~~ SOLN
0.0000 [IU] | Freq: Every day | SUBCUTANEOUS | Status: DC
Start: 2015-07-04 — End: 2015-07-05

## 2015-07-04 MED ORDER — INSULIN ASPART 100 UNIT/ML ~~LOC~~ SOLN
0.0000 [IU] | Freq: Three times a day (TID) | SUBCUTANEOUS | Status: DC
  Administered 2015-07-04: 2 [IU] via SUBCUTANEOUS
  Administered 2015-07-05: 1 [IU] via SUBCUTANEOUS

## 2015-07-04 MED ORDER — MAGNESIUM SULFATE 50 % IJ SOLN: 1.0000 g | Freq: Once | INTRAMUSCULAR | Status: DC

## 2015-07-04 MED ORDER — POTASSIUM CHLORIDE 10 MEQ/100ML IV SOLN
10.0000 meq | INTRAVENOUS | Status: AC
  Administered 2015-07-04 (×2): 10 meq via INTRAVENOUS
  Filled 2015-07-04 (×2): qty 100

## 2015-07-04 NOTE — Progress Notes (Signed)
Utilization review completed.  

## 2015-07-04 NOTE — Progress Notes (Signed)
Patient ambulated with nurse 200 feet, without oxygen and sats remained 98-100%, with no complaints of shortness of breath. Tammy SoursAngela Jazmin Vensel

## 2015-07-04 NOTE — Progress Notes (Signed)
[facility] Progress Note   EDRIC FETTERMAN  WUJ:[id number]  DOB: 01-31-1946  DOA: 07/03/2015 PCP: Eartha Inch, MD  Brief narrative: John Marsh is a 70 y.o. male with COPD on 2 L O2 at night, ongoing smoking, HTN, CAD presenting with dyspnea and cough productive of white sputum. Required a BiPAP on admission. Has now been weaned down to nasal cannula.    Subjective: States he is breathing much better. Still has a cough. No other complaints.   Assessment/Plan: Principal Problem:   COPD exacerbation - acute on chronic resp failure - cont IV Solumedrol, nebulizer treatments, Spiriva, Levaquin and wean O2 as able -Continue to follow in step down unit for today  Active Problems:   TOBACCO DEPENDENCE -He has been counseled to stop smoking    CAD (coronary artery disease), native coronary artery -Continue Plavix and statin  Hyperglycemia -Secondary to steroids-place on low-dose insulin sliding scale for now    Tachycardia -Slowly improving-likely secondary to acute respiratory distress      Antibiotics: Anti-infectives    Start     Dose/Rate Route Frequency Ordered Stop   07/03/15 2345  levofloxacin (LEVAQUIN) IVPB 500 mg     500 mg 100 mL/hr over 60 Minutes Intravenous Daily at bedtime 07/03/15 2321 07/10/15 2159     Code Status:     Code Status Orders        Start     Ordered   07/03/15 2314  Full code   Continuous     07/03/15 2321    Code Status History    Date Active Date Inactive Code Status Order ID Comments User Context   This patient has a current code status but no historical code status.      Disposition Plan: Possibly home tomorrow if he continues to be stable DVT prophylaxis: Lovenox Consultants:  Procedures:     Objective: Filed Weights   07/04/15 0451  Weight: 73 kg (160 lb 15 oz)    Intake/Output Summary (Last 24 hours) at 07/04/15 1004 Last data filed at 07/04/15 0900  Gross per 24 hour  Intake    510 ml  Output    425  ml  Net     85 ml     Vitals Filed Vitals:   07/04/15 0500 07/04/15 0600 07/04/15 0730 07/04/15 0800  BP: 139/56 118/47 125/50 110/50  Pulse:      Temp:   97.8 F (36.6 C)   TempSrc:   Oral   Resp: 21 14 23 19   Height:      Weight:      SpO2:   99%     Exam:  General:  Pt is alert, not in acute distress  HEENT: No icterus, No thrush, oral mucosa moist  Cardiovascular: regular rate and rhythm, S1/S2 No murmur  Respiratory: clear to auscultation bilaterally   Abdomen: Soft, +Bowel sounds, non tender, non distended, no guarding  MSK: No cyanosis or clubbing- no pedal edema   Data Reviewed: Basic Metabolic Panel:  Recent Labs Lab 07/03/15 2022 07/04/15 0002 07/04/15 0757  NA 139 140 140  K 3.8 2.6* 3.6  CL 103 103 105  CO2 25 21* 21*  GLUCOSE 167* 316* 216*  BUN 15 16 16   CREATININE 0.94 1.22 1.15  CALCIUM 9.0 8.7* 8.9  MG  --  2.0  --    Liver Function Tests: No results for input(s): AST, ALT, ALKPHOS, BILITOT, PROT, ALBUMIN in the last 168 hours. No results for input(s): LIPASE,  AMYLASE in the last 168 hours. No results for input(s): AMMONIA in the last 168 hours. CBC:  Recent Labs Lab 07/03/15 2022  WBC 6.2  HGB 13.4  HCT 40.7  MCV 84.4  PLT 158   Cardiac Enzymes: No results for input(s): CKTOTAL, CKMB, CKMBINDEX, TROPONINI in the last 168 hours. BNP (last 3 results)  Recent Labs  07/03/15 2022  BNP 19.8    ProBNP (last 3 results) No results for input(s): PROBNP in the last 8760 hours.  CBG:  Recent Labs Lab 07/04/15 0739  GLUCAP 214*    Recent Results (from the past 240 hour(s))  MRSA PCR Screening     Status: None   Collection Time: 07/04/15  4:48 AM  Result Value Ref Range Status   MRSA by PCR NEGATIVE NEGATIVE Final    Comment:        The GeneXpert MRSA Assay (FDA approved for NASAL specimens only), is one component of a comprehensive MRSA colonization surveillance program. It is not intended to diagnose  MRSA infection nor to guide or monitor treatment for MRSA infections.      Studies: Dg Chest 2 View  07/03/2015  CLINICAL DATA:  Shortness of breath.  History of COPD EXAM: CHEST  2 VIEW COMPARISON:  July 03, 2015 study obtained earlier in the day as well as chest CT January 19, 2015 FINDINGS: Lungs remain hyperexpanded with scarring in the lateral left base. The interstitium is diffusely prominent with multiple small nodular opacities, stable. There is bullous disease in the apices, stable. There is no edema or consolidation. The heart size is normal. The pulmonary vascularity reflects the underlying emphysematous type change. No adenopathy is apparent. There are no appreciable bone lesions. IMPRESSION: No appreciable change compared to study obtained earlier in the day. Underlying COPD with areas of scarring. Small nodular opacities in the left upper lobe are stable. No new opacity. No change in cardiac silhouette. Electronically Signed   By: Bretta BangWilliam  Woodruff III M.D.   On: 07/03/2015 20:55   Dg Chest 2 View  07/03/2015  CLINICAL DATA:  Acute onset of shortness of breath and wheezing. COPD exacerbation. Status post administration of Solu-Medrol. Initial encounter. EXAM: CHEST  2 VIEW COMPARISON:  Chest radiograph performed 08/08/2012, and CT of the chest performed 01/19/2015 FINDINGS: The lungs are hyperexpanded, with flattening of the hemidiaphragms, reflecting the patient's COPD. Scattered bilateral pulmonary nodules are similar in appearance to the prior CT, with a vague ground-glass opacity again noted at the right lung apex. Mild peribronchial thickening is noted. No superimposed focal airspace consolidation is seen at this time. No pleural effusion or pneumothorax is identified. The heart is normal in size. No acute osseous abnormalities are seen. IMPRESSION: Findings of COPD, with scattered bilateral pulmonary nodules and right apical ground-glass opacity, similar in appearance to the prior  CT. No acute focal airspace consolidation seen. Electronically Signed   By: Roanna RaiderJeffery  Chang M.D.   On: 07/03/2015 18:56    Scheduled Meds:  Scheduled Meds: . aspirin EC  81 mg Oral Daily  . budesonide-formoterol  2 puff Inhalation BID  . clopidogrel  75 mg Oral Daily  . enoxaparin (LOVENOX) injection  40 mg Subcutaneous Q24H  . insulin aspart  0-5 Units Subcutaneous QHS  . insulin aspart  0-9 Units Subcutaneous TID WC  . ipratropium-albuterol  3 mL Nebulization Q6H  . levofloxacin (LEVAQUIN) IV  500 mg Intravenous QHS  . methylPREDNISolone (SOLU-MEDROL) injection  60 mg Intravenous Q12H  . potassium chloride  20 mEq Oral BID  . rosuvastatin  20 mg Oral QHS  . sodium chloride  3 mL Intravenous Q12H  . tiotropium  18 mcg Inhalation Daily   Continuous Infusions:   Time spent on care of this patient: 35 min   Nawaal Alling, MD 07/04/2015, 10:04 AM  LOS: 1 day   [facility] Office  [phone] Pager - Text Page per [email or web address] If 7PM-7AM, please contact night-coverage [email or web address]

## 2015-07-04 NOTE — Progress Notes (Signed)
Patient is off of bi-pap and on a 2L [place]. RT will continue to monitor.

## 2015-07-05 LAB — GLUCOSE, CAPILLARY: Glucose-Capillary: 133 mg/dL — ABNORMAL HIGH (ref 65–99)

## 2015-07-05 LAB — PROCALCITONIN: Procalcitonin: <0.1 ng/mL

## 2015-07-05 MED ORDER — HYDROCOD POLST-CPM POLST ER 10-8 MG/5ML PO SUER: 5.0000 mL | Freq: Two times a day (BID) | ORAL | Status: DC

## 2015-07-05 MED ORDER — LEVOFLOXACIN 500 MG PO TABS: 500.0000 mg | ORAL_TABLET | Freq: Every day | ORAL | Status: DC

## 2015-07-05 MED ORDER — PREDNISONE 10 MG PO TABS: ORAL_TABLET | ORAL | Status: DC

## 2015-07-05 NOTE — Progress Notes (Signed)
Patient asking why he is taking IV antibiotics? ?Pneumonia?

## 2015-07-05 NOTE — Discharge Summary (Signed)
Physician Discharge Summary  John HeckHugh T Heying RUE:[id number] DOB: 1946-06-15 DOA: 07/03/2015  PCP: Eartha InchBADGER,MICHAEL C, MD  Admit date: 07/03/2015 Discharge date: 07/05/2015  Time spent: 50 minutes     Discharge Condition: stable    Discharge Diagnoses:  Principal Problem:   COPD exacerbation (HCC) Active Problems:   TOBACCO DEPENDENCE   HYPERTENSION, BENIGN SYSTEMIC   CAD (coronary artery disease), native coronary artery   Tachycardia   Chronic respiratory failure with hypoxia (HCC)   History of present illness:  John Marsh is a 70 y.o. male with COPD on 2 L O2 at night, ongoing smoking, HTN, CAD presenting with dyspnea and cough productive of white sputum. Required a BiPAP on admission. Has now been weaned down to nasal cannula.   Hospital Course:  Principal Problem:  COPD exacerbation - acute on chronic resp failure - given IV Solumedrol, nebulizer treatments, Spiriva &  Levaquin   - weaned down to 2 L O2 which he is on at baseline- transitioned to Prednisone- stable to be discharged on a Prednisone taper  Active Problems:  TOBACCO DEPENDENCE -He has been counseled to stop smoking   CAD (coronary artery disease), native coronary artery -Continue Plavix and statin  Hyperglycemia -Secondary to steroids-placed on low-dose insulin sliding scale while in the hospital   Tachycardia -Slowly improving-likely secondary to acute respiratory distress   Consultations:  none  Discharge Exam: Filed Weights   07/04/15 0451  Weight: 73 kg (160 lb 15 oz)   Filed Vitals:   07/05/15 0400 07/05/15 0744  BP:  111/55  Pulse: 78 77  Temp:  97.8 F (36.6 C)  Resp: 20 26    General: AAO x 3, no distress Cardiovascular: RRR, no murmurs  Respiratory: clear to auscultation bilaterally GI: soft, non-tender, non-distended, bowel sound positive  Discharge Instructions You were cared for by a hospitalist during your hospital stay. If you have any questions about your discharge  medications or the care you received while you were in the hospital after you are discharged, you can call the unit and asked to speak with the hospitalist on call if the hospitalist that took care of you is not available. Once you are discharged, your primary care physician will handle any further medical issues. Please note that NO REFILLS for any discharge medications will be authorized once you are discharged, as it is imperative that you return to your primary care physician (or establish a relationship with a primary care physician if you do not have one) for your aftercare needs so that they can reassess your need for medications and monitor your lab values.      Discharge Instructions    Diet - low sodium heart healthy    Complete by:  As directed      Increase activity slowly    Complete by:  As directed             Medication List    TAKE these medications        AEROCHAMBER Z-STAT PLUS inhaler  Use as instructed     albuterol 108 (90 Base) MCG/ACT inhaler  Commonly known as:  PROVENTIL HFA;VENTOLIN HFA  Inhale 2 puffs into the lungs every 6 (six) hours as needed for wheezing or shortness of breath.     aspirin EC 81 MG tablet  Take 81 mg by mouth daily.     budesonide-formoterol 160-4.5 MCG/ACT inhaler  Commonly known as:  SYMBICORT  Inhale 2 puffs into the lungs 2 (two) times daily.  chlorpheniramine-HYDROcodone 10-8 MG/5ML Suer  Commonly known as:  TUSSIONEX PENNKINETIC ER  Take 5 mLs by mouth 2 (two) times daily.     clopidogrel 75 MG tablet  Commonly known as:  PLAVIX  Take 75 mg by mouth daily.     levofloxacin 500 MG tablet  Commonly known as:  LEVAQUIN  Take 1 tablet (500 mg total) by mouth daily.     nitroGLYCERIN 0.4 MG SL tablet  Commonly known as:  NITROSTAT  Place 0.4 mg under the tongue every 5 (five) minutes as needed for chest pain. x3 doses as needed for chest pain     predniSONE 10 MG tablet  Commonly known as:  DELTASONE  60 mg  tomorrow, taper by 10 mg daily until complete     rosuvastatin 20 MG tablet  Commonly known as:  CRESTOR  Take 20 mg by mouth at bedtime.     tiotropium 18 MCG inhalation capsule  Commonly known as:  SPIRIVA  Place 18 mcg into inhaler and inhale daily.       No Known Allergies Follow-up Information    Follow up with BADGER,MICHAEL C, MD In 1 week.   Specialty:  Family Medicine   Contact information:   [address] [place] [address] [phone]        The results of significant diagnostics from this hospitalization (including imaging, microbiology, ancillary and laboratory) are listed below for reference.    Significant Diagnostic Studies: Dg Chest 2 View  07/03/2015  CLINICAL DATA:  Shortness of breath.  History of COPD EXAM: CHEST  2 VIEW COMPARISON:  July 03, 2015 study obtained earlier in the day as well as chest CT January 19, 2015 FINDINGS: Lungs remain hyperexpanded with scarring in the lateral left base. The interstitium is diffusely prominent with multiple small nodular opacities, stable. There is bullous disease in the apices, stable. There is no edema or consolidation. The heart size is normal. The pulmonary vascularity reflects the underlying emphysematous type change. No adenopathy is apparent. There are no appreciable bone lesions. IMPRESSION: No appreciable change compared to study obtained earlier in the day. Underlying COPD with areas of scarring. Small nodular opacities in the left upper lobe are stable. No new opacity. No change in cardiac silhouette. Electronically Signed   By: Bretta Bang III M.D.   On: 07/03/2015 20:55   Dg Chest 2 View  07/03/2015  CLINICAL DATA:  Acute onset of shortness of breath and wheezing. COPD exacerbation. Status post administration of Solu-Medrol. Initial encounter. EXAM: CHEST  2 VIEW COMPARISON:  Chest radiograph performed 08/08/2012, and CT of the chest performed 01/19/2015 FINDINGS: The lungs are hyperexpanded, with  flattening of the hemidiaphragms, reflecting the patient's COPD. Scattered bilateral pulmonary nodules are similar in appearance to the prior CT, with a vague ground-glass opacity again noted at the right lung apex. Mild peribronchial thickening is noted. No superimposed focal airspace consolidation is seen at this time. No pleural effusion or pneumothorax is identified. The heart is normal in size. No acute osseous abnormalities are seen. IMPRESSION: Findings of COPD, with scattered bilateral pulmonary nodules and right apical ground-glass opacity, similar in appearance to the prior CT. No acute focal airspace consolidation seen. Electronically Signed   By: Roanna Raider M.D.   On: 07/03/2015 18:56    Microbiology: Recent Results (from the past 240 hour(s))  MRSA PCR Screening     Status: None   Collection Time: 07/04/15  4:48 AM  Result Value Ref Range Status  MRSA by PCR NEGATIVE NEGATIVE Final    Comment:        The GeneXpert MRSA Assay (FDA approved for NASAL specimens only), is one component of a comprehensive MRSA colonization surveillance program. It is not intended to diagnose MRSA infection nor to guide or monitor treatment for MRSA infections.      Labs: Basic Metabolic Panel:  Recent Labs Lab 07/03/15 2022 07/04/15 0002 07/04/15 0757  NA 139 140 140  K 3.8 2.6* 3.6  CL 103 103 105  CO2 25 21* 21*  GLUCOSE 167* 316* 216*  BUN 15 16 16   CREATININE 0.94 1.22 1.15  CALCIUM 9.0 8.7* 8.9  MG  --  2.0  --    Liver Function Tests: No results for input(s): AST, ALT, ALKPHOS, BILITOT, PROT, ALBUMIN in the last 168 hours. No results for input(s): LIPASE, AMYLASE in the last 168 hours. No results for input(s): AMMONIA in the last 168 hours. CBC:  Recent Labs Lab 07/03/15 2022  WBC 6.2  HGB 13.4  HCT 40.7  MCV 84.4  PLT 158   Cardiac Enzymes: No results for input(s): CKTOTAL, CKMB, CKMBINDEX, TROPONINI in the last 168 hours. BNP: BNP (last 3  results)  Recent Labs  07/03/15 2022  BNP 19.8    ProBNP (last 3 results) No results for input(s): PROBNP in the last 8760 hours.  CBG:  Recent Labs Lab 07/04/15 0739 07/04/15 1116 07/04/15 1600 07/04/15 2125  GLUCAP 214* 161* 125* 115*       SignedCalvert Cantor, MD [facility] 07/05/2015, 8:27 AM

## 2015-07-09 ENCOUNTER — Encounter: Payer: Self-pay | Admitting: Vascular Surgery

## 2015-07-12 ENCOUNTER — Encounter: Payer: Self-pay | Admitting: Vascular Surgery

## 2015-07-20 ENCOUNTER — Other Ambulatory Visit: Payer: Self-pay

## 2015-07-20 ENCOUNTER — Encounter: Payer: Self-pay | Admitting: Vascular Surgery

## 2015-07-20 ENCOUNTER — Ambulatory Visit (INDEPENDENT_AMBULATORY_CARE_PROVIDER_SITE_OTHER): Payer: Medicare Other | Admitting: Vascular Surgery

## 2015-07-20 VITALS — BP 129/75 | HR 75 | Temp 96.8°F | Resp 18 | Ht 72.25 in | Wt 162.7 lb

## 2015-07-20 DIAGNOSIS — I70213 Atherosclerosis of native arteries of extremities with intermittent claudication, bilateral legs: Secondary | ICD-10-CM

## 2015-07-20 NOTE — Progress Notes (Signed)
Filed Vitals:   07/20/15 1234 07/20/15 1237  BP: 147/74 129/75  Pulse: 75   Temp: 96.8 F (36 C)   TempSrc: Oral   Resp: 18   Height: 6' 0.25" (1.835 m)   Weight: 162 lb 11.2 oz (73.8 kg)   SpO2: 98%

## 2015-07-20 NOTE — Progress Notes (Signed)
[facility] [place]  Patient name: John Marsh MRN: [id number] DOB: 1946/01/15 Sex: male  REASON FOR CONSULT:  Limiting bilateral lower extremity claudication left greater than right  HPI: John Marsh is a 70 y.o. male, who is  Seen today for discussion of his intermittent claudication. He is a very pleasant 70 year old gentleman who is quite active. He reports limiting claudication in both lower extremities. This is somewhat worse on his left than on his right. He describes an aching and burning sensation that occurs with a short distance walking initially begins in his left leg and then continues into his right. He reports that if he pushes is a great deal he can have some discomfort in his hips and thighs as well. Fortunately he has had no history of lower extremity tissue loss or limiting threatening ischemia. Does have history of prior myocardial infarction and stent placement. He is a very heavy smoker having smoked up to 3 packs per day. He is currently now down to less than half pack per day. He is trying to continue to completely quit smoking.  Past Medical History  Diagnosis Date  . Hypertension   . COPD (chronic obstructive pulmonary disease) (HCC)   . MI (myocardial infarction) (HCC)   . Hyperlipidemia   . Emphysema of lung (HCC)   . Nocturnal hypoxia   . Multiple lung nodules on CT   . Shortness of breath dyspnea   . Peripheral arterial disease (HCC)   . Coronary heart disease     Family History  Problem Relation Age of Onset  . Heart disease Mother     died at 34  . Heart disease Father   . Heart disease Brother   . Heart disease Sister   . Lung disease Neg Hx     SOCIAL HISTORY: Social History   Social History  . Marital Status: Single    Spouse Name: N/A  . Number of Children: N/A  . Years of Education: N/A   Occupational History  . retired    Social History Main Topics  . Smoking status: Current Every Day Smoker -- 0.25  packs/day for 55 years    Types: Cigarettes    Start date: 08/01/1959  . Smokeless tobacco: Not on file     Comment: 1/2 ppd 06/03/15 - peak 3ppd   taking Chantix currently 07-20-2015  . Alcohol Use: No  . Drug Use: No  . Sexual Activity: No   Other Topics Concern  . Not on file   Social History Narrative   Originally from [place]. Previously has lived in [place]. He moved to [facility] in [place]. He serve in [place]. 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 He has been to [place], [place], [place], several countries in [place], countries in [place], [place], & multiple [place] Countries. 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 He has a dog, 7 cats, & 2 horses. Remote exposure to a parrot for a few years in a different home. No mold exposure. No hot tub exposure. 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     No Known Allergies  Current Outpatient Prescriptions  Medication Sig Dispense Refill  . albuterol (PROVENTIL HFA;VENTOLIN HFA) 108 (90 BASE) MCG/ACT inhaler Inhale 2 puffs into the lungs every 6 (six) hours as needed for wheezing or shortness of breath.    Marland Kitchen aspirin EC  81 MG tablet Take 81 mg by mouth daily.    . budesonide-formoterol (SYMBICORT) 160-4.5 MCG/ACT inhaler Inhale 2 puffs into the lungs 2 (two) times daily.    . CHANTIX STARTING MONTH PAK 0.5 MG X 11 & 1 MG X 42 tablet 2 (two) times daily.    . chlorpheniramine-HYDROcodone (TUSSIONEX PENNKINETIC ER) 10-8 MG/5ML SUER Take 5 mLs by mouth 2 (two) times daily. 140 mL 0  . clopidogrel (PLAVIX) 75 MG tablet Take 75 mg by mouth daily.    Marland Kitchen levofloxacin (LEVAQUIN) 500 MG tablet Take 1 tablet (500 mg total) by mouth daily. 5 tablet 0  . nitroGLYCERIN (NITROSTAT) 0.4 MG SL tablet Place 0.4 mg under the tongue every 5 (five) minutes as needed for chest pain. x3 doses as needed for chest pain    . predniSONE (DELTASONE) 10 MG tablet 60 mg  tomorrow, taper by 10 mg daily until complete 21 tablet 0  . rosuvastatin (CRESTOR) 20 MG tablet Take 20 mg by mouth at bedtime.    Marland Kitchen Spacer/Aero-Holding Chambers (AEROCHAMBER Z-STAT PLUS) inhaler Use as instructed 1 each 0  . tiotropium (SPIRIVA) 18 MCG inhalation capsule Place 18 mcg into inhaler and inhale daily.     No current facility-administered medications for this visit.    REVIEW OF SYSTEMS:   denotes positive finding,  denotes negative finding Cardiac  Comments:  Chest pain or chest pressure:    Shortness of breath upon exertion:    Short of breath when lying flat:    Irregular heart rhythm:        Vascular    Pain in calf, thigh, or hip brought on by ambulation: x   Pain in feet at night that wakes you up from your sleep:     Blood clot in your veins:    Leg swelling:         Pulmonary    Oxygen at home: x   Productive cough:  x   Wheezing:         Neurologic    Sudden weakness in arms or legs:     Sudden numbness in arms or legs:     Sudden onset of difficulty speaking or slurred speech:    Temporary loss of vision in one eye:     Problems with dizziness:         Gastrointestinal    Blood in stool:     Vomited blood:         Genitourinary    Burning when urinating:     Blood in urine:        Psychiatric    Major depression:         Hematologic    Bleeding problems:    Problems with blood clotting too easily:        Skin    Rashes or ulcers:        Constitutional    Fever or chills:      PHYSICAL EXAM: Filed Vitals:   07/20/15 1234 07/20/15 1237  BP: 147/74 129/75  Pulse: 75   Temp: 96.8 F (36 C)   TempSrc: Oral   Resp: 18   Height: 6' 0.25" (1.835 m)   Weight: 162 lb 11.2 oz (73.8 kg)   SpO2: 98%     GENERAL: The patient is a well-nourished male, in no acute distress. The vital signs are documented above. CARDIAC: There is a regular rate and rhythm.  VASCULAR:  2+ radial and 2+ femoral pulses bilaterally.  Absent popliteal and  distal pulses bilaterally. Carotid arteries without bruits bilaterally. PULMONARY: There is good air exchange bilaterally without wheezing or rales. ABDOMEN: Soft and non-tender with normal pitched bowel sounds.  MUSCULOSKELETAL: There are no major deformities or cyanosis. NEUROLOGIC: No focal weakness or paresthesias are detected. SKIN: There are no ulcers or rashes noted. PSYCHIATRIC: The patient has a normal affect.  DATA:   noninvasive vascular laboratory studies from outlying facility. This was duplex imaging. Did not have any ankle arm indices. This reveals possible aortoiliac stenosis by waveform. Has probable left superficial artery occlusion and high-grade stenosis and right superficial femoral artery.  MEDICAL ISSUES:  had long discussion with the patient. Explained this is not currently limb threatening. Congratulated him on his progress regarding smoking cessation. Explained that if he does have aortoiliac occlusive disease this could be amenable to angioplasty and stenting. By physical exam this seems to be more in for inguinal disease. I did explain the potential for femoral-popliteal bypasses but certainly would reserve this for intolerable ischemia. He wishes to proceed with arteriography and possible iliac intervention. We will schedule this as an utpatient at his convenience. If his only option would be femoropopliteal bypass, would the continued discussion of this but would advise not proceeding currently unless he has truly intolerable claudication.   Gretta Began [facility] [place] [facility]per: [phone]

## 2015-07-21 ENCOUNTER — Ambulatory Visit (INDEPENDENT_AMBULATORY_CARE_PROVIDER_SITE_OTHER)
Admission: RE | Admit: 2015-07-21 | Discharge: 2015-07-21 | Disposition: A | Discharge status: Home / Self Care | Payer: Medicare Other | Source: Ambulatory Visit | Attending: Pulmonary Disease | Admitting: Pulmonary Disease

## 2015-07-21 DIAGNOSIS — R918 Other nonspecific abnormal finding of lung field: Secondary | ICD-10-CM

## 2015-07-22 ENCOUNTER — Telehealth: Payer: Self-pay | Admitting: Pulmonary Disease

## 2015-07-22 ENCOUNTER — Encounter: Payer: Self-pay | Admitting: Physician Assistant

## 2015-07-22 NOTE — Telephone Encounter (Signed)
Spoke with patient regarding CT result & reviewed myself. One nodule is particularly suspicious. Plan for repeat CT w/o contrast in May 2017.

## 2015-07-23 ENCOUNTER — Telehealth: Payer: Self-pay | Admitting: *Deleted

## 2015-07-23 DIAGNOSIS — R911 Solitary pulmonary nodule: Secondary | ICD-10-CM

## 2015-07-23 NOTE — Telephone Encounter (Signed)
CT ordered.. See labs.

## 2015-07-23 NOTE — Telephone Encounter (Signed)
-----   Message from Roslynn Amble, MD sent at 07/22/2015  6:25 PM EST ----- I spoke with the patient about his CT result. Please order a CT scan of the chest w/o contrast for May 2017. Thanks.

## 2015-07-27 ENCOUNTER — Ambulatory Visit (HOSPITAL_COMMUNITY)
Admission: RE | Admit: 2015-07-27 | Discharge: 2015-07-27 | Disposition: A | Discharge status: Home / Self Care | Payer: Medicare Other | Source: Ambulatory Visit | Attending: Surgery | Admitting: Surgery

## 2015-07-27 ENCOUNTER — Encounter (HOSPITAL_COMMUNITY)
Admission: RE | Disposition: A | Discharge status: Home / Self Care | Payer: Self-pay | Source: Ambulatory Visit | Attending: Surgery

## 2015-07-27 DIAGNOSIS — Z7982 Long term (current) use of aspirin: Secondary | ICD-10-CM | POA: Insufficient documentation

## 2015-07-27 DIAGNOSIS — R918 Other nonspecific abnormal finding of lung field: Secondary | ICD-10-CM | POA: Insufficient documentation

## 2015-07-27 DIAGNOSIS — I70213 Atherosclerosis of native arteries of extremities with intermittent claudication, bilateral legs: Secondary | ICD-10-CM | POA: Diagnosis not present

## 2015-07-27 DIAGNOSIS — I739 Peripheral vascular disease, unspecified: Secondary | ICD-10-CM

## 2015-07-27 DIAGNOSIS — I252 Old myocardial infarction: Secondary | ICD-10-CM | POA: Insufficient documentation

## 2015-07-27 DIAGNOSIS — J449 Chronic obstructive pulmonary disease, unspecified: Secondary | ICD-10-CM | POA: Insufficient documentation

## 2015-07-27 DIAGNOSIS — E785 Hyperlipidemia, unspecified: Secondary | ICD-10-CM | POA: Insufficient documentation

## 2015-07-27 DIAGNOSIS — Z8249 Family history of ischemic heart disease and other diseases of the circulatory system: Secondary | ICD-10-CM | POA: Diagnosis not present

## 2015-07-27 DIAGNOSIS — Z7902 Long term (current) use of antithrombotics/antiplatelets: Secondary | ICD-10-CM | POA: Diagnosis not present

## 2015-07-27 DIAGNOSIS — F17210 Nicotine dependence, cigarettes, uncomplicated: Secondary | ICD-10-CM | POA: Diagnosis not present

## 2015-07-27 DIAGNOSIS — I2510 Atherosclerotic heart disease of native coronary artery without angina pectoris: Secondary | ICD-10-CM | POA: Insufficient documentation

## 2015-07-27 DIAGNOSIS — I10 Essential (primary) hypertension: Secondary | ICD-10-CM | POA: Insufficient documentation

## 2015-07-27 HISTORY — PX: PERIPHERAL VASCULAR CATHETERIZATION: SHX172C

## 2015-07-27 HISTORY — DX: Peripheral vascular disease, unspecified: I73.9

## 2015-07-27 LAB — POCT I-STAT, CHEM 8
BUN: 18 mg/dL (ref 6–20)
CHLORIDE: 102 mmol/L (ref 101–111)
CREATININE: 1 mg/dL (ref 0.61–1.24)
Calcium, Ion: 1.18 mmol/L (ref 1.13–1.30)
Glucose, Bld: 117 mg/dL — ABNORMAL HIGH (ref 65–99)
HEMATOCRIT: 42 % (ref 39.0–52.0)
Hemoglobin: 14.3 g/dL (ref 13.0–17.0)
Potassium: 3.9 mmol/L (ref 3.5–5.1)
SODIUM: 141 mmol/L (ref 135–145)
TCO2: 29 mmol/L (ref 0–100)

## 2015-07-27 SURGERY — ABDOMINAL AORTOGRAM
Anesthesia: LOCAL

## 2015-07-27 MED ORDER — LIDOCAINE HCL (PF) 1 % IJ SOLN
INTRAMUSCULAR | Status: AC
  Filled 2015-07-27: qty 30

## 2015-07-27 MED ORDER — MIDAZOLAM HCL 2 MG/2ML IJ SOLN
INTRAMUSCULAR | Status: AC
  Filled 2015-07-27: qty 2

## 2015-07-27 MED ORDER — SODIUM CHLORIDE 0.9 % IV SOLN
INTRAVENOUS | Status: DC
  Administered 2015-07-27: 09:00:00 via INTRAVENOUS

## 2015-07-27 MED ORDER — LIDOCAINE HCL (PF) 1 % IJ SOLN
INTRAMUSCULAR | Status: DC | PRN
  Administered 2015-07-27: 20 mL

## 2015-07-27 MED ORDER — MIDAZOLAM HCL 2 MG/2ML IJ SOLN
INTRAMUSCULAR | Status: DC | PRN
  Administered 2015-07-27: 1 mg via INTRAVENOUS

## 2015-07-27 MED ORDER — IODIXANOL 320 MG/ML IV SOLN
INTRAVENOUS | Status: DC | PRN
  Administered 2015-07-27: 130 mL via INTRAVENOUS

## 2015-07-27 MED ORDER — HEPARIN (PORCINE) IN NACL 2-0.9 UNIT/ML-% IJ SOLN
INTRAMUSCULAR | Status: AC
  Filled 2015-07-27: qty 1000

## 2015-07-27 MED ORDER — FENTANYL CITRATE (PF) 100 MCG/2ML IJ SOLN
INTRAMUSCULAR | Status: DC | PRN
  Administered 2015-07-27: 50 ug via INTRAVENOUS

## 2015-07-27 MED ORDER — FENTANYL CITRATE (PF) 100 MCG/2ML IJ SOLN
INTRAMUSCULAR | Status: AC
  Filled 2015-07-27: qty 2

## 2015-07-27 MED ORDER — SODIUM CHLORIDE 0.9 % IV SOLN: 1.0000 mL/kg/h | INTRAVENOUS | Status: DC

## 2015-07-27 SURGICAL SUPPLY — 10 items
CATH ANGIO 5F PIGTAIL 65CM (CATHETERS) ×1 IMPLANT
COVER PRB 48X5XTLSCP FOLD TPE (BAG) IMPLANT
COVER PROBE 5X48 (BAG) ×3
KIT MICROINTRODUCER STIFF 5F (SHEATH) ×1 IMPLANT
KIT PV (KITS) ×3 IMPLANT
SHEATH PINNACLE 5F 10CM (SHEATH) ×1 IMPLANT
SYR MEDRAD MARK V 150ML (SYRINGE) ×3 IMPLANT
TRANSDUCER W/STOPCOCK (MISCELLANEOUS) ×3 IMPLANT
TRAY PV CATH (CUSTOM PROCEDURE TRAY) ×3 IMPLANT
WIRE BENTSON .035X145CM (WIRE) ×1 IMPLANT

## 2015-07-27 NOTE — Op Note (Signed)
   PATIENT: John Marsh   MRN: [id number] DOB: 1945/09/20    DATE OF PROCEDURE: 07/27/2015  INDICATIONS: [name] is a 70 y.o. male who presented with bilateral lower extremity claudication. He was set up for arteriography.  PROCEDURE:  1. Ultrasound-guided access to the left common femoral artery 2. Aortogram with bilateral iliac arteriogram and bilateral lower extremity runoff  SURGEON: Di Kindle. Edilia Bo, MD, FACS  ANESTHESIA: local with sedation   EBL: minimal  TECHNIQUE: The patient was taken to the peripheral vascular lab.  The patient received 1 mg of Versed and 50 g of fentanyl at 11:46 AM. The patient's heart rate, blood pressure, and saturation were monitored during the sedation. The procedure ended at 12:20 PM. The groins were prepped and draped in usual sterile fashion. Under ultrasound guidance, after the skin was anesthetized, the left common femoral artery was cannulated with a micropuncture needle and a micropuncture sheath introduced over a wire. This was exchanged for a 5 [place] sheath over a [employer] wire. The Pickell catheter was positioned at the L1 vertebral body and flush aortogram obtained. The catheter was in position above the aortic bifurcation and an oblique iliac projection was obtained. Bilateral lower extremity runoff films were obtained. Order to get better visualization of the left leg the pigtail catheter was removed over a wire and a retrograde left femoral arteriogram obtained. A total of 130 cc of Visipaque was used. At the completion of the procedure, the sheath was removed and pressure held for hemostasis. No immediate competitions were noted.  FINDINGS:  1. There are single renal arteries bilaterally with no significant renal artery stenosis identified. The infrarenal aorta is widely patent. Bilateral common iliac arteries and external iliac arteries are widely patent. There is moderate disease in the proximal left hypogastric artery and mild disease of  the right hypogastric artery. 2. On the left side, which is the more symptomatic side, there is diffuse mild disease throughout the proximal superficial femoral artery. The superficial femoral artery is occluded in the mid thigh with reconstitution of the above-knee popliteal artery. There is three-vessel runoff on the left anterior tibial, posterior tibial, and peroneal arteries. 3. On the right side, there is moderate disease of the proximal superficial femoral artery and mild diffuse disease of the superficial femoral artery. There is a moderate stenosis at the adductor canal. The above-knee popliteal artery is patent as is the below-knee popliteal artery with three-vessel runoff on the right knee the anterior tibial, posterior tibial, and arteries.  CLINICAL NOTE: The patient will follow up with Dr. Arbie Cookey and only consider femoropopliteal bypass grafting if his symptoms progress significantly.  Waverly Ferrari, MD, FACS [facility]  DATE OF DICTATION:   07/27/2015

## 2015-07-27 NOTE — Interval H&P Note (Signed)
History and Physical Interval Note:  07/27/2015 11:42 AM  [name]  has presented today for surgery, with the diagnosis of pvd with bilateral lower extremity claudication  The various methods of treatment have been discussed with the patient and family. After consideration of risks, benefits and other options for treatment, the patient has consented to  Procedure(s): Abdominal Aortogram (N/A) as a surgical intervention .  The patient's history has been reviewed, patient examined, no change in status, stable for surgery.  I have reviewed the patient's chart and labs.  Questions were answered to the patient's satisfaction.     John Marsh

## 2015-07-27 NOTE — Discharge Instructions (Signed)

## 2015-07-27 NOTE — H&P (View-Only) (Signed)
 [facility] [place]  Patient name: John Marsh MRN: [id number] DOB: 12/03/1945 Sex: male  REASON FOR CONSULT:  Limiting bilateral lower extremity claudication left greater than right  HPI: John Marsh is a 69 y.o. male, who is  Seen today for discussion of his intermittent claudication. He is a very pleasant 69-year-old gentleman who is quite active. He reports limiting claudication in both lower extremities. This is somewhat worse on his left than on his right. He describes an aching and burning sensation that occurs with a short distance walking initially begins in his left leg and then continues into his right. He reports that if he pushes is a great deal he can have some discomfort in his hips and thighs as well. Fortunately he has had no history of lower extremity tissue loss or limiting threatening ischemia. Does have history of prior myocardial infarction and stent placement. He is a very heavy smoker having smoked up to 3 packs per day. He is currently now down to less than half pack per day. He is trying to continue to completely quit smoking.  Past Medical History  Diagnosis Date  . Hypertension   . COPD (chronic obstructive pulmonary disease) (HCC)   . MI (myocardial infarction) (HCC)   . Hyperlipidemia   . Emphysema of lung (HCC)   . Nocturnal hypoxia   . Multiple lung nodules on CT   . Shortness of breath dyspnea   . Peripheral arterial disease (HCC)   . Coronary heart disease     Family History  Problem Relation Age of Onset  . Heart disease Mother     died at 57  . Heart disease Father   . Heart disease Brother   . Heart disease Sister   . Lung disease Neg Hx     SOCIAL HISTORY: Social History   Social History  . Marital Status: Single    Spouse Name: N/A  . Number of Children: N/A  . Years of Education: N/A   Occupational History  . retired    Social History Main Topics  . Smoking status: Current Every Day Smoker -- 0.25  packs/day for 55 years    Types: Cigarettes    Start date: 08/01/1959  . Smokeless tobacco: Not on file     Comment: 1/2 ppd 06/03/15 - peak 3ppd   taking Chantix currently 07-20-2015  . Alcohol Use: No  . Drug Use: No  . Sexual Activity: No   Other Topics Concern  . Not on file   Social History Narrative   Originally from [place]. Previously has lived in [place]. He moved to [place] in 1964. He serve in [place]. He served in special ops. He has been to [place], [place], [place], several countries in [place], countries in [place], [place], & multiple [place]. 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 He has a dog, 7 cats, & 2 horses. Remote exposure to a parrot for a few years in a different home. No mold exposure. No hot tub exposure. 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     No Known Allergies  Current Outpatient Prescriptions  Medication Sig Dispense Refill  . albuterol (PROVENTIL HFA;VENTOLIN HFA) 108 (90 BASE) MCG/ACT inhaler Inhale 2 puffs into the lungs every 6 (six) hours as needed for wheezing or shortness of breath.    . aspirin EC   81 MG tablet Take 81 mg by mouth daily.    . budesonide-formoterol (SYMBICORT) 160-4.5 MCG/ACT inhaler Inhale 2 puffs into the lungs 2 (two) times daily.    . CHANTIX STARTING MONTH PAK 0.5 MG X 11 & 1 MG X 42 tablet 2 (two) times daily.    . chlorpheniramine-HYDROcodone (TUSSIONEX PENNKINETIC ER) 10-8 MG/5ML SUER Take 5 mLs by mouth 2 (two) times daily. 140 mL 0  . clopidogrel (PLAVIX) 75 MG tablet Take 75 mg by mouth daily.    . levofloxacin (LEVAQUIN) 500 MG tablet Take 1 tablet (500 mg total) by mouth daily. 5 tablet 0  . nitroGLYCERIN (NITROSTAT) 0.4 MG SL tablet Place 0.4 mg under the tongue every 5 (five) minutes as needed for chest pain. x3 doses as needed for chest pain    . predniSONE (DELTASONE) 10 MG tablet 60 mg  tomorrow, taper by 10 mg daily until complete 21 tablet 0  . rosuvastatin (CRESTOR) 20 MG tablet Take 20 mg by mouth at bedtime.    . Spacer/Aero-Holding Chambers (AEROCHAMBER Z-STAT PLUS) inhaler Use as instructed 1 each 0  . tiotropium (SPIRIVA) 18 MCG inhalation capsule Place 18 mcg into inhaler and inhale daily.     No current facility-administered medications for this visit.    REVIEW OF SYSTEMS:  [X] denotes positive finding, [ ] denotes negative finding Cardiac  Comments:  Chest pain or chest pressure:    Shortness of breath upon exertion:    Short of breath when lying flat:    Irregular heart rhythm:        Vascular    Pain in calf, thigh, or hip brought on by ambulation: x   Pain in feet at night that wakes you up from your sleep:     Blood clot in your veins:    Leg swelling:         Pulmonary    Oxygen at home: x   Productive cough:  x   Wheezing:         Neurologic    Sudden weakness in arms or legs:     Sudden numbness in arms or legs:     Sudden onset of difficulty speaking or slurred speech:    Temporary loss of vision in one eye:     Problems with dizziness:         Gastrointestinal    Blood in stool:     Vomited blood:         Genitourinary    Burning when urinating:     Blood in urine:        Psychiatric    Major depression:         Hematologic    Bleeding problems:    Problems with blood clotting too easily:        Skin    Rashes or ulcers:        Constitutional    Fever or chills:      PHYSICAL EXAM: Filed Vitals:   07/20/15 1234 07/20/15 1237  BP: 147/74 129/75  Pulse: 75   Temp: 96.8 F (36 C)   TempSrc: Oral   Resp: 18   Height: 6' 0.25" (1.835 m)   Weight: 162 lb 11.2 oz (73.8 kg)   SpO2: 98%     GENERAL: The patient is a well-nourished male, in no acute distress. The vital signs are documented above. CARDIAC: There is a regular rate and rhythm.  VASCULAR:  2+ radial and 2+ femoral pulses bilaterally.   Absent popliteal and  distal pulses bilaterally. Carotid arteries without bruits bilaterally. PULMONARY: There is good air exchange bilaterally without wheezing or rales. ABDOMEN: Soft and non-tender with normal pitched bowel sounds.  MUSCULOSKELETAL: There are no major deformities or cyanosis. NEUROLOGIC: No focal weakness or paresthesias are detected. SKIN: There are no ulcers or rashes noted. PSYCHIATRIC: The patient has a normal affect.  DATA:   noninvasive vascular laboratory studies from outlying facility. This was duplex imaging. Did not have any ankle arm indices. This reveals possible aortoiliac stenosis by waveform. Has probable left superficial artery occlusion and high-grade stenosis and right superficial femoral artery.  MEDICAL ISSUES:  had long discussion with the patient. Explained this is not currently limb threatening. Congratulated him on his progress regarding smoking cessation. Explained that if he does have aortoiliac occlusive disease this could be amenable to angioplasty and stenting. By physical exam this seems to be more in for inguinal disease. I did explain the potential for femoral-popliteal bypasses but certainly would reserve this for intolerable ischemia. He wishes to proceed with arteriography and possible iliac intervention. We will schedule this as an utpatient at his convenience. If his only option would be femoropopliteal bypass, would the continued discussion of this but would advise not proceeding currently unless he has truly intolerable claudication.   Gretta Began [facility] [place] [facility]: [phone]

## 2015-07-28 ENCOUNTER — Encounter (HOSPITAL_COMMUNITY): Payer: Self-pay | Admitting: Vascular Surgery

## 2015-08-04 ENCOUNTER — Telehealth: Payer: Self-pay | Admitting: Vascular Surgery

## 2015-08-04 NOTE — Telephone Encounter (Signed)
-----   Message from Glori Bickers, RN sent at 08/03/2015  2:10 PM EST ----- Regarding: RE: schedule Fara Boros with Dr. Arbie Cookey regarding follow-up on Mr. Currier. Per Dr. Arbie Cookey, patient would need fem-pop bp but not at this time. He left it up to the patient, whether he would like to schedule now or call if symptoms worsened. Spoke with patient and he has no desire for surgery at this point and will call our office as needed for any questions/concerns.   Thanks, Judeth Cornfield ----- Message -----    From: Fredrich Birks    Sent: 08/02/2015   4:49 PM      To: Glori Bickers, RN Subject: FW: schedule                                   Hi Judeth Cornfield-  Have you heard anything from Dr Arbie Cookey on this patient? If not, I can ask him tomorrow when he is in the office.  Thanks, Annabelle Harman ----- Message -----    From: Sharee Pimple, RN    Sent: 07/27/2015   1:20 PM      To: [facility] Admin Pool Subject: schedule                                         ----- Message -----    From: Chuck Hint, MD    Sent: 07/27/2015  12:36 PM      To: [facility] Charge Pool Subject: charge                                         PROCEDURE:  1. Ultrasound-guided access to the left common femoral artery 2. Aortogram with bilateral iliac arteriogram and bilateral lower extremity runoff  SURGEON: Di Kindle. Edilia Bo, MD, FACS  I would ask Dr. Arbie Cookey when he wants to see this patient back. I did try to send him a message through [facility].  CD

## 2015-10-20 ENCOUNTER — Ambulatory Visit (HOSPITAL_COMMUNITY)
Admission: RE | Admit: 2015-10-20 | Discharge: 2015-10-20 | Disposition: A | Discharge status: Home / Self Care | Payer: Medicare Other | Source: Ambulatory Visit | Attending: Pulmonary Disease | Admitting: Pulmonary Disease

## 2015-10-20 DIAGNOSIS — R911 Solitary pulmonary nodule: Secondary | ICD-10-CM

## 2015-10-20 DIAGNOSIS — J432 Centrilobular emphysema: Secondary | ICD-10-CM | POA: Insufficient documentation

## 2015-10-20 DIAGNOSIS — R918 Other nonspecific abnormal finding of lung field: Secondary | ICD-10-CM | POA: Diagnosis not present

## 2015-10-22 ENCOUNTER — Other Ambulatory Visit: Payer: Self-pay

## 2015-10-22 DIAGNOSIS — R918 Other nonspecific abnormal finding of lung field: Secondary | ICD-10-CM

## 2015-12-07 ENCOUNTER — Encounter: Payer: Self-pay | Admitting: Pulmonary Disease

## 2015-12-07 ENCOUNTER — Ambulatory Visit (INDEPENDENT_AMBULATORY_CARE_PROVIDER_SITE_OTHER): Payer: Medicare Other | Admitting: Pulmonary Disease

## 2015-12-07 VITALS — BP 136/64 | HR 87 | Ht 72.0 in | Wt 164.0 lb

## 2015-12-07 DIAGNOSIS — R918 Other nonspecific abnormal finding of lung field: Secondary | ICD-10-CM

## 2015-12-07 DIAGNOSIS — J439 Emphysema, unspecified: Secondary | ICD-10-CM | POA: Diagnosis not present

## 2015-12-07 DIAGNOSIS — I70213 Atherosclerosis of native arteries of extremities with intermittent claudication, bilateral legs: Secondary | ICD-10-CM | POA: Diagnosis not present

## 2015-12-07 DIAGNOSIS — J449 Chronic obstructive pulmonary disease, unspecified: Secondary | ICD-10-CM

## 2015-12-07 DIAGNOSIS — F17200 Nicotine dependence, unspecified, uncomplicated: Secondary | ICD-10-CM | POA: Diagnosis not present

## 2015-12-07 DIAGNOSIS — J309 Allergic rhinitis, unspecified: Secondary | ICD-10-CM

## 2015-12-07 DIAGNOSIS — G4734 Idiopathic sleep related nonobstructive alveolar hypoventilation: Secondary | ICD-10-CM

## 2015-12-07 NOTE — Patient Instructions (Addendum)
   Continue using your medications as prescribed  Call me if you have any new breathing problems before your next appointment  Remember to get your influenza vaccine in the fall  I will see you back in 6 months or sooner if needed  TESTS ORDERED: 1. Spirometry with bronchodilator challenge and next appointment 2. 6 minute walk test on room air at next appointment 3. CT chest without contrast August 2017

## 2015-12-07 NOTE — Addendum Note (Signed)
Addended by: Boone MasterJONES, JESSICA E on: 12/07/2015 02:43 PM   Modules accepted: Orders

## 2015-12-07 NOTE — Progress Notes (Signed)
Subjective:    Patient ID: John Marsh, male    DOB: 10/13/1945, 70 y.o.   MRN: [id number]  C.C.:  Follow-up for Very Severe COPD w/ Emphysema, Bilateral Lung Nodules, Nocturnal Hypoxia, Allergic Rhinitis, & Ongoing Tobacco Use.  HPI Very Severe COPD w/ Emphysema:  Previously underwent pulmonary rehabilitation. Patient reports continued intermittent coughing productive of a white mucus. Reports rare wheezing mostly in the morning. Compliant with Spiriva and Symbicort. Uses his rescue inhaler 2-3 times weekly. No exacerbations since last appointment.   Bilateral Lung Nodules: Patient found to have right upper lobe groundglass nodule on CT imaging in March 2016 initially. Repeat CT imaging from August 2016 shows multiple new subcentimeter groundglass nodules bilaterally. Patient also has evidence of calcified pulmonary nodules and splenic calcification/mediastinal lymph node calcification suggestive of prior granulomatous disease. Previously lived in [place] & [place] previously. Repeat CT imaging shows stability of nearly 1 cm nodules in right upper lobe as well as calcified nodules.  Nocturnal Hypoxia: Prescribed oxygen at 2 L/m while sleeping. He reports he is continuing to use oxygen while he's sleeping.  Allergic Rhinitis:  Continuing on Singulair. He is still taking Nasacort daily. Reports improved sinus congestion & post -nasal drainage.   Ongoing Tobacco Use:  Reports he is only smoking 2-3 cigarettes a month. Reports significantly decreased cravings.  Review of Systems  No chest pain or pressure. No fever, chills, or sweats. No nausea or emesis.   No Known Allergies  Current Outpatient Prescriptions on File Prior to Visit  Medication Sig Dispense Refill  . albuterol (PROVENTIL HFA;VENTOLIN HFA) 108 (90 BASE) MCG/ACT inhaler Inhale 2 puffs into the lungs every 6 (six) hours as needed for wheezing or shortness of breath.    Marland Kitchen aspirin EC 81 MG tablet Take 81 mg by mouth daily.    .  budesonide-formoterol (SYMBICORT) 160-4.5 MCG/ACT inhaler Inhale 2 puffs into the lungs 2 (two) times daily.    . clopidogrel (PLAVIX) 75 MG tablet Take 75 mg by mouth daily.    . nitroGLYCERIN (NITROSTAT) 0.4 MG SL tablet Place 0.4 mg under the tongue every 5 (five) minutes as needed for chest pain. x3 doses as needed for chest pain    . rosuvastatin (CRESTOR) 20 MG tablet Take 20 mg by mouth at bedtime.    Marland Kitchen Spacer/Aero-Holding Chambers (AEROCHAMBER Z-STAT PLUS) inhaler Use as instructed 1 each 0  . tiotropium (SPIRIVA) 18 MCG inhalation capsule Place 18 mcg into inhaler and inhale daily.     No current facility-administered medications on file prior to visit.   Past Medical History  Diagnosis Date  . Hypertension   . COPD (chronic obstructive pulmonary disease) (HCC)   . MI (myocardial infarction) (HCC)   . Hyperlipidemia   . Emphysema of lung (HCC)   . Nocturnal hypoxia   . Multiple lung nodules on CT   . Shortness of breath dyspnea   . Peripheral arterial disease (HCC)   . Coronary heart disease    Past Surgical History  Procedure Laterality Date  . Stent placed    . Cholecystectomy    . Nasal septum surgery    . Peripheral vascular catheterization N/A 07/27/2015    Procedure: Abdominal Aortogram;  Surgeon: Chuck Hint, MD;  Location: [facility] INVASIVE CV LAB;  Service: Cardiovascular;  Laterality: N/A;  . Peripheral vascular catheterization Bilateral 07/27/2015    Procedure: Lower Extremity Angiography;  Surgeon: Chuck Hint, MD;  Location: [facility] INVASIVE CV LAB;  Service: Cardiovascular;  Laterality:  Bilateral;   Family History  Problem Relation Age of Onset  . Heart disease Mother     died at 1357  . Heart disease Father   . Heart disease Brother   . Heart disease Sister   . Lung disease Neg Hx    Social History   Social History  . Marital Status: Single    Spouse Name: N/A  . Number of Children: N/A  . Years of Education: N/A   Occupational History  .  retired    Social History Main Topics  . Smoking status: Current Every Day Smoker -- 0.25 packs/day for 55 years    Types: Cigarettes    Start date: 08/01/1959  . Smokeless tobacco: None     Comment: 1/2 ppd 06/03/15 - peak 3ppd   taking Chantix currently 07-20-2015  . Alcohol Use: No  . Drug Use: No  . Sexual Activity: No   Other Topics Concern  . None   Social History Narrative   [place]. 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 He has been to [place], [place], [place], [place] countries in [place], countries in [place], [place], & multiple [place]. 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 He has a dog, 7 cats, & 2 horses. Remote exposure to a parrot for a few years in a different home. No mold exposure. No hot tub exposure. 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       Objective:   Physical Exam BP 136/64 mmHg  Pulse 87  Ht 6' (1.829 m)  Wt 164 lb (74.39 kg)  BMI 22.24 kg/m2  SpO2 97% General:  No distress. Awake. Alert. Integument:  Warm & dry. No rash on exposed skin.  HEENT:  No oral ulcers. No scleral injection. No nasal turbinate swelling.  Cardiovascular:  Regular rate. No edema. Normal S1 & S2. Pulmonary:  Clear bilaterally to auscultation. Normal work of breathing on room air. Prolonged exhalation phase. Abdomen: Soft. Normal bowel sounds. Nontender. Lymphatics: No appreciated cervical or supraclavicular lymphadenopathy.   PFT 06/03/15: FVC 3.07 L (63%) FEV1 1.20 L (33%) FEV1/FVC 0.39 FEF 25-75 0.47 L (17%) negative bronchodilator response 09/14/14: FVC 2.50 L (51%) FEV1 1.15 L (32%) FEV1/FVC 0.46 FEF 25-75 0.33 L (12%) positive bronchodilator response TLC 9.10 L (120%) RV 236% DLCO uncorrected 36%  6MWT 06/03/15:  Walked 336 meters / Baseline Sat 97% on RA /  Nadir Sat 97% on RA  OVERNIGHT PULSE OX 03/09/15: Performed on RA. 9:12:32 analyzed. Spent 10:32 with saturation </= 88%. 08/18/14: Performed on room air & 7 hours 56 minutes analyzed. Lowest saturation 83%. 14.3 minutes with saturation </= 88%. 73 desaturations events. Lowest pulse 56 bpm.  IMAGING CT CHEST W/O 10/20/15 (personally reviewed by me): 1.1 x 0.5 cm spiculated nodule right upper lobe distorted by surrounding emphysema. Adjacent 9 mm nodule as well. No new developing pulmonary nodules. Calcified nodules again noted as well as small amount of calcification with mediastinal lymph nodes. Apical predominant emphysematous changes. No pleural effusion or thickening. No pericardial effusion. No pathologic mediastinal adenopathy.  CT CHEST W/O 01/19/15 (previously reviewed by me): No pleural effusion or thickening appreciated. Scattered bilateral subcentimeter groundglass pulmonary nodules. Additional calcified pulmonary nodules consistent with evidence of prior granulomatous disease given mediastinal lymph node with calcification &  calcification within spleen. Radiologist commented on 1.2 x 1.2 irregular groundglass focus in the right upper lobe which is significantly distorted by severe upper lobe predominant emphysema. No pathologic mediastinal adenopathy. Radiologist commented that there are no bruits subcentimeter nodules that are new. Indeed these nodules were not present on my review of CT imaging from 2005 & 2006. The first time these were found was March 2016.  LABS 03/04/15 Alpha-1 antitrypsin:  MM (205) IgE:  21 RAST Panel:  Aspergillus Fumgitagus 0.36 (class 1) / Alternaria alternata 0.27 (class 0/1) / helminthosporium halodes 0.30 (class 0/1)  08/08/12 CBC: 6.5/15.0/42.5/171 BMP: 139/3.6/99/29/9/0.89/144/9.3   Assessment & Plan:  70 year old male with very severe COPD and emphysema as well as ongoing tobacco use. I again spent over 3 minutes counseling the patient will need for  complete tobacco cessation. Symptomatically he remains very well controlled with regards to his underlying COPD and emphysema. We reviewed his chest CT scan which again shows spiculated nodules in his right upper lobe which have not changed in size in 3 months. I would favor a repeat CT scan in 3 months to continue to ensure stability. Patient is continuing to use oxygen at night while sleeping. His allergic rhinitis seems to have significantly improved with current medication therapy. I instructed the patient contact my office if he had any new breathing problems or questions before his next appointment.  1. Very severe COPD w/ Emphysema: Continuing patient on Spiriva & Symbicort. No changes. Repeat spirometry with bronchodilator challenge asked appointment. 2. Bilateral Pulmonary Nodules: Repeat CT chest without contrast August 2017. 3. Nocturnal Hypoxia: Continuing nocturnal oxygen at 2 L/m. 4. Ongoing tobacco use: Spelled over 3 minutes counseling the patient on the need for complete tobacco cessation. 5. Seasonal Allergies/Allergic Rhinitis: Well controlled with Nasacort & Singulair. No changes. 6. Health maintenance: Received influenza vaccine October 2016, Prevnar December 2016, & Pneumovax June 2005. Plan for Pneumovax December 2017. 7. Follow-up: Return to clinic in 6 months or sooner if needed.  Donna Christen Jamison Neighbor, M.D. [facility] Pulmonary & Critical Care Pager:  [phone] After 3pm or if no response, call [phone] 2:13 PM 12/07/2015

## 2016-01-26 ENCOUNTER — Ambulatory Visit (HOSPITAL_COMMUNITY)
Admission: RE | Admit: 2016-01-26 | Discharge: 2016-01-26 | Disposition: A | Discharge status: Home / Self Care | Payer: Medicare Other | Source: Ambulatory Visit | Attending: Pulmonary Disease | Admitting: Pulmonary Disease

## 2016-01-26 DIAGNOSIS — I700 Atherosclerosis of aorta: Secondary | ICD-10-CM | POA: Insufficient documentation

## 2016-01-26 DIAGNOSIS — J439 Emphysema, unspecified: Secondary | ICD-10-CM | POA: Diagnosis present

## 2016-01-26 DIAGNOSIS — I2510 Atherosclerotic heart disease of native coronary artery without angina pectoris: Secondary | ICD-10-CM | POA: Diagnosis not present

## 2016-01-26 DIAGNOSIS — J449 Chronic obstructive pulmonary disease, unspecified: Secondary | ICD-10-CM | POA: Diagnosis not present

## 2016-01-28 ENCOUNTER — Other Ambulatory Visit: Payer: Self-pay | Admitting: Pulmonary Disease

## 2016-01-28 DIAGNOSIS — R918 Other nonspecific abnormal finding of lung field: Secondary | ICD-10-CM

## 2016-01-28 NOTE — Progress Notes (Signed)
Called and spoke to pt. Informed him of the results and recs per JN. Order placed for CT chest in 6 months. Pt verbalized understanding and denied any further questions or concerns at this time.

## 2016-02-01 ENCOUNTER — Other Ambulatory Visit: Payer: Self-pay | Admitting: Pulmonary Disease

## 2016-05-30 ENCOUNTER — Encounter: Payer: Self-pay | Admitting: Pulmonary Disease

## 2016-05-30 ENCOUNTER — Ambulatory Visit (INDEPENDENT_AMBULATORY_CARE_PROVIDER_SITE_OTHER): Payer: Medicare Other | Admitting: Pulmonary Disease

## 2016-05-30 VITALS — BP 158/78 | HR 78 | Ht 72.0 in | Wt 156.4 lb

## 2016-05-30 DIAGNOSIS — I70213 Atherosclerosis of native arteries of extremities with intermittent claudication, bilateral legs: Secondary | ICD-10-CM

## 2016-05-30 DIAGNOSIS — R918 Other nonspecific abnormal finding of lung field: Secondary | ICD-10-CM

## 2016-05-30 DIAGNOSIS — F17200 Nicotine dependence, unspecified, uncomplicated: Secondary | ICD-10-CM | POA: Diagnosis not present

## 2016-05-30 DIAGNOSIS — R0600 Dyspnea, unspecified: Secondary | ICD-10-CM

## 2016-05-30 DIAGNOSIS — J302 Other seasonal allergic rhinitis: Secondary | ICD-10-CM | POA: Diagnosis not present

## 2016-05-30 DIAGNOSIS — J449 Chronic obstructive pulmonary disease, unspecified: Secondary | ICD-10-CM

## 2016-05-30 DIAGNOSIS — J439 Emphysema, unspecified: Secondary | ICD-10-CM

## 2016-05-30 LAB — PULMONARY FUNCTION TEST
FEF 25-75 POST: 0.63 L/s
FEF 25-75 Pre: 0.38 L/sec
FEF2575-%Change-Post: 65 %
FEF2575-%PRED-POST: 23 %
FEF2575-%Pred-Pre: 14 %
FEV1-%Change-Post: 6 %
FEV1-%PRED-POST: 32 %
FEV1-%Pred-Pre: 30 %
FEV1-POST: 1.13 L
FEV1-Pre: 1.06 L
FEV1FVC-%Change-Post: -13 %
FEV1FVC-%PRED-PRE: 59 %
FEV6-%CHANGE-POST: 20 %
FEV6-%PRED-POST: 61 %
FEV6-%Pred-Pre: 51 %
FEV6-Post: 2.79 L
FEV6-Pre: 2.31 L
FEV6FVC-%CHANGE-POST: -1 %
FEV6FVC-%Pred-Post: 98 %
FEV6FVC-%Pred-Pre: 99 %
FVC-%CHANGE-POST: 22 %
FVC-%Pred-Post: 62 %
FVC-%Pred-Pre: 51 %
FVC-Post: 3.01 L
FVC-Pre: 2.46 L
POST FEV1/FVC RATIO: 37 %
Post FEV6/FVC ratio: 93 %
Pre FEV1/FVC ratio: 43 %
Pre FEV6/FVC Ratio: 94 %

## 2016-05-30 NOTE — Progress Notes (Signed)
6MWT 05/30/16:  Walked 399 meters / Baseline Sat 97% on RA / Nadir Sat 95% on RA

## 2016-05-30 NOTE — Progress Notes (Signed)
Test reviewed.  

## 2016-05-30 NOTE — Patient Instructions (Signed)
   Continue using your medications as prescribed.  Call me if you have any new breathing problems before your next appointment.  I will see you back in August after your CT scan or sooner if needed.  TESTS ORDERED: 1. Change Chest CT w/o Contrast to August 2018

## 2016-05-30 NOTE — Progress Notes (Signed)
Subjective:    Patient ID: John Marsh, male    DOB: 1946-06-08, 70 y.o.   MRN: [id number]  C.C.:  Follow-up for Very Severe COPD w/ Emphysema, Bilateral Lung Nodules, Nocturnal Hypoxia, Chronic Seasonal Allergic Rhinitis, & Tobacco Use Disorder.  HPI Very Severe COPD w/ Emphysema:  Previously underwent pulmonary rehabilitation and still does their maintenance program. Prescribed Symbicort & Spiriva. He reports minimal wheezing & coughing. He does feel like his breathing is doing better. He is using his rescue inhaler once every 2-3 days. No exacerbations since last appointment.   Bilateral Lung Nodules: Patient found to have right upper lobe groundglass nodule on CT imaging in March 2016 initially. Repeat CT imaging from August 2016 shows multiple new subcentimeter groundglass nodules bilaterally. Patient also has evidence of calcified pulmonary nodules and splenic calcification/mediastinal lymph node calcification suggestive of prior granulomatous disease. Previously lived in [place] previously. Nodules are predominantly upper lobe still on repeat CT imaging in August 2017.  Nocturnal Hypoxia: Prescribed oxygen at 2 L/m while sleeping. Compliant and sleeps well with it.   Chronic Seasonal Allergic Rhinitis:  Prescribed Singulair & Nasacort. He reports continued sinus drainage but no sinus pressure or pain.   Tobacco Use Disorder:  He still uses cigarettes every other week 2-3 per week.   Review of Systems  No fever or chills. No chest pain or pressure at rest. No nausea or emesis.   No Known Allergies  Current Outpatient Prescriptions on File Prior to Visit  Medication Sig Dispense Refill  . albuterol (PROVENTIL HFA;VENTOLIN HFA) 108 (90 BASE) MCG/ACT inhaler Inhale 2 puffs into the lungs every 6 (six) hours as needed for wheezing or shortness of breath.    Marland Kitchen aspirin EC 81 MG tablet Take 81 mg by mouth daily.    . budesonide-formoterol (SYMBICORT) 160-4.5 MCG/ACT inhaler Inhale 2 puffs  into the lungs 2 (two) times daily.    . clopidogrel (PLAVIX) 75 MG tablet Take 75 mg by mouth daily.    . montelukast (SINGULAIR) 5 MG chewable tablet Chew 5 mg by mouth at bedtime.    . montelukast (SINGULAIR) 5 MG chewable tablet CHEW 1 TABLET BY MOUTH AT BEDTIME 30 tablet 3  . nitroGLYCERIN (NITROSTAT) 0.4 MG SL tablet Place 0.4 mg under the tongue every 5 (five) minutes as needed for chest pain. x3 doses as needed for chest pain    . rosuvastatin (CRESTOR) 20 MG tablet Take 20 mg by mouth at bedtime.    Marland Kitchen Spacer/Aero-Holding Chambers (AEROCHAMBER Z-STAT PLUS) inhaler Use as instructed 1 each 0  . tiotropium (SPIRIVA) 18 MCG inhalation capsule Place 18 mcg into inhaler and inhale daily.    Marland Kitchen triamcinolone (NASACORT ALLERGY 24HR) 55 MCG/ACT AERO nasal inhaler Place 2 sprays into the nose daily.     No current facility-administered medications on file prior to visit.    Past Medical History:  Diagnosis Date  . COPD (chronic obstructive pulmonary disease) (HCC)   . Coronary heart disease   . Emphysema of lung (HCC)   . Hyperlipidemia   . Hypertension   . MI (myocardial infarction)   . Multiple lung nodules on CT   . Nocturnal hypoxia   . Peripheral arterial disease (HCC)   . Shortness of breath dyspnea    Past Surgical History:  Procedure Laterality Date  . CHOLECYSTECTOMY    . NASAL SEPTUM SURGERY    . PERIPHERAL VASCULAR CATHETERIZATION N/A 07/27/2015   Procedure: Abdominal Aortogram;  Surgeon: Di Kindle  Edilia Boickson, MD;  [facility]VAS[facility] CV LAB;  Service: Cardiovascular;  Laterality: N/A;  . PERIPHERAL VASCULAR CATHETERIZATION Bilateral 07/27/2015   Procedure: Lower Extremity Angiography;  Surgeon: Chuck Hinthristopher S Dickson, MD;  [facility];  Service: Cardiovascular;  Laterality: Bilateral;  . stent placed     Family History  Problem Relation Age of Onset  . Heart disease Mother     died at 3557  . Heart disease Father   . Heart disease Brother   . Heart  disease Sister   . Lung disease Neg Hx    Social History   Social History  . Marital status: Single    Spouse name: N/A  . Number of children: N/A  . Years of education: N/A   Occupational History  . retired    Social History Main Topics  . Smoking status: Current Every Day Smoker    Packs/day: 0.25    Years: 55.00    Types: Cigarettes    Start date: 08/01/1959  . Smokeless tobacco: None     Comment: 1/2 ppd 06/03/15 - peak 3ppd   taking Chantix currently 07-20-2015  . Alcohol use No  . Drug use: No  . Sexual activity: No   Other Topics Concern  . None   Social History Narrative   Originally from [place]. Previously [place] lived in [place]. [place] He serve [place]. 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 He has been to [place], [place], [place], [place] countries in [place], countries in [place], [place], & multiple [place]. 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 He has a dog, 7 cats, & 2 horses. Remote exposure to a parrot for a few years in a different home. No mold exposure. No hot tub exposure. 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       Objective:   Physical Exam BP (!) 158/78   Pulse 78   Ht 6' (1.829 m)   Wt 156 lb 6.4 oz (70.9 kg)   SpO2 97%   BMI 21.21 kg/m  General:  Thin Caucasian male. No distress. Comfortable. Integument:  Warm & dry. No rash on exposed skin.  HEENT:  No nasal turbinate swelling. No scleral icterus. Moist mucous membranes. Cardiovascular:  Regular rate. No edema. Normal S1 & S2. Pulmonary:  Symmetrically decreased breath sounds with prolonged exhalation phase. Overall clear to auscultation with normal work of breathing on room air. Abdomen: Soft. Normal bowel sounds. Nontender. Lymphatics: No appreciated cervical or supraclavicular lymphadenopathy.   PFT  05/30/16: FVC 2.46 L (51%) FEV1 1.06 L (30%)  FEV1/FVC 0.43 FEF 25-75 0.3 L (14%) positive bronchodilator response 06/03/15: FVC 3.07 L (63%) FEV1 1.20 L (33%) FEV1/FVC 0.39 FEF 25-75 0.47 L (17%) negative bronchodilator response 09/14/14: FVC 2.50 L (51%) FEV1 1.15 L (32%) FEV1/FVC 0.46 FEF 25-75 0.33 L (12%) positive bronchodilator response TLC 9.10 L (120%) RV 236% DLCO uncorrected 36%  6MWT 05/30/16:  Walked 399 meters / Baseline Sat 97% on RA / Nadir Sat 95% on RA 06/03/15:  Walked 336 meters / Baseline Sat 97% on RA / Nadir Sat 97% on RA  OVERNIGHT PULSE OX 03/09/15:Performed on RA. 9:12:32 analyzed. Spent 10:32 with saturation </= 88%. 08/18/14: Performed on room air & 7 hours 56 minutes analyzed. Lowest saturation 83%. 14.3 minutes with saturation </= 88%. 73 desaturations events. Lowest pulse 56  bpm.  IMAGING CT CHEST W/O 01/26/16 (previously reviewed by me): Moderate to severe upper lobe predominant emphysematous changes. Saber-sheath trachea. No consolidation. Calcified nodules bilaterally. Upper lobe predominant nodules are essentially unchanged with largest measuring 1.2 x 0.5 cm and right upper lobe. Cavitary 0.9 cm peripheral right upper lobe nodule unchanged going back to 2016. No pathologic mediastinal adenopathy. No pleural effusion.  CT CHEST W/O 10/20/15 (previously reviewed by me): 1.1 x 0.5 cm spiculated nodule right upper lobe distorted by surrounding emphysema. Adjacent 9 mm nodule as well. No new developing pulmonary nodules. Calcified nodules again noted as well as small amount of calcification with mediastinal lymph nodes. Apical predominant emphysematous changes. No pleural effusion or thickening. No pericardial effusion. No pathologic mediastinal adenopathy.  CT CHEST W/O 01/19/15 (previously reviewed by me): No pleural effusion or thickening appreciated. Scattered bilateral subcentimeter groundglass pulmonary nodules. Additional calcified pulmonary nodules consistent with evidence of prior granulomatous disease given  mediastinal lymph node with calcification & calcification within spleen. Radiologist commented on 1.2 x 1.2 irregular groundglass focus in the right upper lobe which is significantly distorted by severe upper lobe predominant emphysema. No pathologic mediastinal adenopathy. Radiologist commented that there are no bruits subcentimeter nodules that are new. Indeed these nodules were not present on my review of CT imaging from 2005 & 2006. The first time these were found was March 2016.  LABS 03/04/15 Alpha-1 antitrypsin:  MM (205) IgE:  21 RAST Panel:  Aspergillus Fumgitagus 0.36 (class 1) / Alternaria alternata 0.27 (class 0/1) / helminthosporium halodes 0.30 (class 0/1)  08/08/12 CBC: 6.5/15.0/42.5/171 BMP: 139/3.6/99/29/9/0.89/144/9.3   Assessment & Plan:  70 y.o. male with very severe COPD and emphysema as well as ongoing tobacco use. I spent over 3 minutes counseling the patient on the need for complete tobacco cessation to prevent worsening of his lung function. I believe this is the major contributing factor to his continued bronchodilator response on spirometry. He does continue to have very severe airway obstruction. I instructed the patient to contact my office if he had any new breathing problems or questions before his next appointment as I would be happy to see him sooner.  1. Very severe COPD w/ Emphysema: Continuing Symbicort and Spiriva. No changes. 2. Bilateral Pulmonary Nodules: Repeat CT chest without contrast August 2018. 3. Nocturnal Hypoxia: Continuing nocturnal oxygen at 2 L/m. 4. Tobacco Use Disorder: Spent over 3 minutes counseling the patient on the need for complete tobacco cessation to prevent worsening of lung function. 5. Chronic Seasonal Allergic Rhinitis: Continuing Nasacort and Singulair. No changes. 6. Health Maintenance: S/P Influenza Vaccine this Fall, Prevnar December 2016 & Pneumovax June 2005. Administering Pneumovax today. 7. Follow-up: Return to clinic in  August 2018 or sooner if needed.  Donna ChristenJennings E. Jamison NeighborNestor, M.D. [facility] Pager:  [phone] After 3pm or if no response, call [phone] 1:38 PM 05/30/16

## 2016-06-23 ENCOUNTER — Telehealth: Payer: Self-pay

## 2016-06-23 DIAGNOSIS — I70213 Atherosclerosis of native arteries of extremities with intermittent claudication, bilateral legs: Secondary | ICD-10-CM

## 2016-06-23 DIAGNOSIS — I70223 Atherosclerosis of native arteries of extremities with rest pain, bilateral legs: Secondary | ICD-10-CM

## 2016-06-23 NOTE — Telephone Encounter (Signed)
Phone call from pt. Requested an appt. With Dr. Arbie CookeyEarly.  Reported his legs still hurt.  Stated his legs "ache and jump" at night.  Also, reported he can't walk very far, due to the burning in his lower legs.  Stated it is not worse, but is not getting any better.  Denied any open sores on LE's.  Advised will have Scheduler call back with appt. information.  Discussed need of vasc. Studies with Dr. Imogene Burnhen; recommended ABI's.

## 2016-06-23 NOTE — Telephone Encounter (Signed)
Called patient and scheduled an appt for abi's on 06/26/16 and to see TFE on 06/28/16. Patient aware of two separate appt dates/times. awt

## 2016-06-26 ENCOUNTER — Ambulatory Visit (HOSPITAL_COMMUNITY): Admission: RE | Admit: 2016-06-26 | Payer: Medicare Other | Source: Ambulatory Visit

## 2016-06-27 ENCOUNTER — Ambulatory Visit (HOSPITAL_COMMUNITY)
Admission: RE | Admit: 2016-06-27 | Discharge: 2016-06-27 | Disposition: A | Discharge status: Home / Self Care | Payer: Medicare Other | Source: Ambulatory Visit | Attending: Vascular Surgery | Admitting: Vascular Surgery

## 2016-06-27 DIAGNOSIS — I70213 Atherosclerosis of native arteries of extremities with intermittent claudication, bilateral legs: Secondary | ICD-10-CM | POA: Insufficient documentation

## 2016-06-27 DIAGNOSIS — I70223 Atherosclerosis of native arteries of extremities with rest pain, bilateral legs: Secondary | ICD-10-CM | POA: Diagnosis not present

## 2016-06-28 ENCOUNTER — Encounter: Payer: Self-pay | Admitting: Vascular Surgery

## 2016-06-28 ENCOUNTER — Ambulatory Visit (INDEPENDENT_AMBULATORY_CARE_PROVIDER_SITE_OTHER): Payer: Medicare Other | Admitting: Vascular Surgery

## 2016-06-28 VITALS — BP 154/84 | HR 79 | Temp 98.1°F | Resp 20 | Ht 72.0 in | Wt 164.0 lb

## 2016-06-28 DIAGNOSIS — I70223 Atherosclerosis of native arteries of extremities with rest pain, bilateral legs: Secondary | ICD-10-CM | POA: Diagnosis not present

## 2016-06-28 DIAGNOSIS — I70213 Atherosclerosis of native arteries of extremities with intermittent claudication, bilateral legs: Secondary | ICD-10-CM | POA: Diagnosis not present

## 2016-06-28 NOTE — Progress Notes (Signed)
[facility] [place]  Patient name: John Marsh MRN: [id number] DOB: Dec 17, 1945 Sex: male  REASON FOR VISIT: follow-up  HPI: John Marsh is a 71 y.o. male who presents for continued follow-up of his bilateral leg claudication. The patient previously underwent an arteriogram showing a left SFA occlusion with three-vessel runoff and moderate disease of the right SFA three-vessel runoff. The patient continues to complain of pain in his calves after ambulating approximately 400-600 feet. He states that he has to stop on the way back from his mailbox and rest. His biggest concern is pain in his legs in addition weakness with lifting his grandchild. He also complains of a burning sensation in his legs at rest. He denies any nonhealing wounds or rest pain.  He denies any prior history of back pain. He is trying to cut down on smoking. He is now smoking 1 pack per week. He takes aspirin, Plavix and statin daily. Smokes history includes COPD followed by Dr. Jamison Neighbor, CAD, hyperlipidemia, hypertension.   Past Medical History:  Diagnosis Date  . COPD (chronic obstructive pulmonary disease) (HCC)   . Coronary heart disease   . Emphysema of lung (HCC)   . Hyperlipidemia   . Hypertension   . MI (myocardial infarction)   . Multiple lung nodules on CT   . Nocturnal hypoxia   . Peripheral arterial disease (HCC)   . Shortness of breath dyspnea     Family History  Problem Relation Age of Onset  . Heart disease Mother     died at 25  . Heart disease Father   . Heart disease Brother   . Heart disease Sister   . Lung disease Neg Hx     SOCIAL HISTORY: Social History  Substance Use Topics  . Smoking status: Current Every Day Smoker    Packs/day: 0.50    Years: 55.00    Types: Cigarettes    Start date: 08/01/1959  . Smokeless tobacco: Never Used     Comment: 1/2 ppd 06/03/15 - peak 3ppd   taking Chantix currently 07-20-2015  . Alcohol use No    No Known  Allergies  Current Outpatient Prescriptions  Medication Sig Dispense Refill  . albuterol (PROVENTIL HFA;VENTOLIN HFA) 108 (90 BASE) MCG/ACT inhaler Inhale 2 puffs into the lungs every 6 (six) hours as needed for wheezing or shortness of breath.    Marland Kitchen aspirin EC 81 MG tablet Take 81 mg by mouth daily.    . budesonide-formoterol (SYMBICORT) 160-4.5 MCG/ACT inhaler Inhale 2 puffs into the lungs 2 (two) times daily.    . clopidogrel (PLAVIX) 75 MG tablet Take 75 mg by mouth daily.    . montelukast (SINGULAIR) 5 MG chewable tablet CHEW 1 TABLET BY MOUTH AT BEDTIME 30 tablet 3  . nitroGLYCERIN (NITROSTAT) 0.4 MG SL tablet Place 0.4 mg under the tongue every 5 (five) minutes as needed for chest pain. x3 doses as needed for chest pain    . rosuvastatin (CRESTOR) 20 MG tablet Take 20 mg by mouth at bedtime.    Marland Kitchen Spacer/Aero-Holding Chambers (AEROCHAMBER Z-STAT PLUS) inhaler Use as instructed 1 each 0  . tiotropium (SPIRIVA) 18 MCG inhalation capsule Place 18 mcg into inhaler and inhale daily.    Marland Kitchen triamcinolone (NASACORT ALLERGY 24HR) 55 MCG/ACT AERO nasal inhaler Place 2 sprays into the nose daily.    . montelukast (SINGULAIR) 5 MG chewable tablet Chew 5 mg by mouth at bedtime.     No current facility-administered medications  for this visit.     REVIEW OF SYSTEMS:  [X]  denotes positive finding, [ ]  denotes negative finding Cardiac  Comments:  Chest pain or chest pressure:    Shortness of breath upon exertion:    Short of breath when lying flat:    Irregular heart rhythm:        Vascular    Pain in calf, thigh, or hip brought on by ambulation: x   Pain in feet at night that wakes you up from your sleep:     Blood clot in your veins:    Leg swelling:         Pulmonary    Oxygen at home:    Productive cough:     Wheezing:         Neurologic    Sudden weakness in arms or legs:     Sudden numbness in arms or legs:     Sudden onset of difficulty speaking or slurred speech:    Temporary  loss of vision in one eye:     Problems with dizziness:         Gastrointestinal    Blood in stool:     Vomited blood:         Genitourinary    Burning when urinating:     Blood in urine:        Psychiatric    Major depression:         Hematologic    Bleeding problems:    Problems with blood clotting too easily:        Skin    Rashes or ulcers:        Constitutional    Fever or chills:      PHYSICAL EXAM: Vitals:   06/28/16 1559 06/28/16 1600  BP: (!) 148/84 (!) 154/84  Pulse: 79   Resp: 20   Temp: 98.1 F (36.7 C)   TempSrc: Oral   SpO2: 98%   Weight: 164 lb (74.4 kg)   Height: 6' (1.829 m)     GENERAL: The patient is a well-nourished male, in no acute distress. The vital signs are documented above. CARDIAC: There is a regular rate and rhythm.  VASCULAR: 2+ femoral pulses, palpable popliteal pulses bilaterally, palpable right dorsalis pedis pulse, faintly palpable right posterior tibial pulse, non palpable left pedal pulses.  PULMONARY: There is good air exchange bilaterally without wheezing or rales. ABDOMEN: Soft and non-tender.  MUSCULOSKELETAL: There are no major deformities or cyanosis. NEUROLOGIC: No focal weakness or paresthesias are detected. SKIN: There are no ulcers or rashes noted. PSYCHIATRIC: The patient has a normal affect.  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 Discussed that his symptoms at rest is likely of a neurologic etiology. Advised the patient to have this issue addressed as this is the most bothersome issue. [facility] to [facility]. His bilateral claudication is tolerable. Discussed that as long as his claudication symptoms are tolerable, we will continue to  manage this conservatively with maximal medical management and smoking cessation. He will follow up as needed. He knows to call us if he develops a nonhealing wound or if his claudication becomes intolerable.   Maris BergerKimberly Trinh, PA-C [facility] MD:  Yancy Knoble   I have examined the patient, reviewed and agree with above. Patient presented with worsening lower extremity symptoms. This mainly is at rest. Reports that he cannot stand and hold his grandchild without his legs feeling is that they will give way. He does have calf claudication symptoms worse on his left leg and his right but this is not his main symptom. He does report that he cannot resume routine activities with the calf symptoms. Also has great deal of difficulty with cramping in his legs at night and has had some relief with the medications for this. Physical exam reveals palpable popliteal pulses bilaterally and normal pedal pulses on the right with a near normal ankle arm index.  Again had long discussion with patient explaining that he does have moderate lower from the arterial insufficiency but this is not consistent with the majority of symptoms. I have recommended referral to neurology for further evaluation of this. I did explain that he could be treated with revascularization of both lower extremities for his claudication but would recommend against this since this is not terribly limiting to him. He understands and will follow-up with [place] as needed pending his neurology evaluation  Gretta Began, MD 06/28/2016 4:35 PM

## 2016-06-30 ENCOUNTER — Encounter (HOSPITAL_COMMUNITY): Payer: Self-pay | Admitting: Emergency Medicine

## 2016-06-30 ENCOUNTER — Observation Stay (HOSPITAL_COMMUNITY)
Admission: EM | Admit: 2016-06-30 | Discharge: 2016-07-01 | Disposition: A | Discharge status: Home / Self Care | Payer: Medicare Other | Attending: Internal Medicine | Admitting: Internal Medicine

## 2016-06-30 ENCOUNTER — Observation Stay (HOSPITAL_COMMUNITY): Payer: Medicare Other

## 2016-06-30 ENCOUNTER — Emergency Department (HOSPITAL_COMMUNITY): Payer: Medicare Other

## 2016-06-30 DIAGNOSIS — Z7982 Long term (current) use of aspirin: Secondary | ICD-10-CM | POA: Diagnosis not present

## 2016-06-30 DIAGNOSIS — J449 Chronic obstructive pulmonary disease, unspecified: Secondary | ICD-10-CM | POA: Insufficient documentation

## 2016-06-30 DIAGNOSIS — M79602 Pain in left arm: Secondary | ICD-10-CM | POA: Insufficient documentation

## 2016-06-30 DIAGNOSIS — I2510 Atherosclerotic heart disease of native coronary artery without angina pectoris: Secondary | ICD-10-CM | POA: Diagnosis not present

## 2016-06-30 DIAGNOSIS — F17210 Nicotine dependence, cigarettes, uncomplicated: Secondary | ICD-10-CM | POA: Insufficient documentation

## 2016-06-30 DIAGNOSIS — R079 Chest pain, unspecified: Secondary | ICD-10-CM

## 2016-06-30 DIAGNOSIS — M25519 Pain in unspecified shoulder: Secondary | ICD-10-CM

## 2016-06-30 DIAGNOSIS — I10 Essential (primary) hypertension: Secondary | ICD-10-CM | POA: Insufficient documentation

## 2016-06-30 DIAGNOSIS — I739 Peripheral vascular disease, unspecified: Secondary | ICD-10-CM | POA: Diagnosis present

## 2016-06-30 DIAGNOSIS — J439 Emphysema, unspecified: Secondary | ICD-10-CM | POA: Diagnosis present

## 2016-06-30 DIAGNOSIS — Z79899 Other long term (current) drug therapy: Secondary | ICD-10-CM | POA: Insufficient documentation

## 2016-06-30 DIAGNOSIS — R0789 Other chest pain: Secondary | ICD-10-CM | POA: Diagnosis present

## 2016-06-30 DIAGNOSIS — E785 Hyperlipidemia, unspecified: Secondary | ICD-10-CM | POA: Diagnosis present

## 2016-06-30 HISTORY — DX: Chest pain, unspecified: R07.9

## 2016-06-30 LAB — COMPREHENSIVE METABOLIC PANEL
ALK PHOS: 80 U/L (ref 38–126)
ALT: 15 U/L — AB (ref 17–63)
AST: 16 U/L (ref 15–41)
Albumin: 4 g/dL (ref 3.5–5.0)
Anion gap: 9 (ref 5–15)
BILIRUBIN TOTAL: 0.3 mg/dL (ref 0.3–1.2)
BUN: 16 mg/dL (ref 6–20)
CALCIUM: 9.2 mg/dL (ref 8.9–10.3)
CO2: 29 mmol/L (ref 22–32)
CREATININE: 1.05 mg/dL (ref 0.61–1.24)
Chloride: 100 mmol/L — ABNORMAL LOW (ref 101–111)
Glucose, Bld: 110 mg/dL — ABNORMAL HIGH (ref 65–99)
Potassium: 4.2 mmol/L (ref 3.5–5.1)
SODIUM: 138 mmol/L (ref 135–145)
TOTAL PROTEIN: 7.2 g/dL (ref 6.5–8.1)

## 2016-06-30 LAB — CBC WITH DIFFERENTIAL/PLATELET
Basophils Absolute: 0 10*3/uL (ref 0.0–0.1)
Basophils Relative: 1 %
Eosinophils Absolute: 0.1 10*3/uL (ref 0.0–0.7)
Eosinophils Relative: 1 %
HEMATOCRIT: 42.7 % (ref 39.0–52.0)
HEMOGLOBIN: 14.4 g/dL (ref 13.0–17.0)
LYMPHS ABS: 1.3 10*3/uL (ref 0.7–4.0)
LYMPHS PCT: 19 %
MCH: 29.1 pg (ref 26.0–34.0)
MCHC: 33.7 g/dL (ref 30.0–36.0)
MCV: 86.3 fL (ref 78.0–100.0)
MONOS PCT: 9 %
Monocytes Absolute: 0.6 10*3/uL (ref 0.1–1.0)
NEUTROS PCT: 70 %
Neutro Abs: 4.9 10*3/uL (ref 1.7–7.7)
Platelets: 198 10*3/uL (ref 150–400)
RBC: 4.95 MIL/uL (ref 4.22–5.81)
RDW: 13.3 % (ref 11.5–15.5)
WBC: 7 10*3/uL (ref 4.0–10.5)

## 2016-06-30 LAB — TROPONIN I: Troponin I: <0.03 ng/mL (ref <0.03)

## 2016-06-30 MED ORDER — ACETAMINOPHEN 325 MG PO TABS: 650.0000 mg | ORAL_TABLET | ORAL | Status: DC | PRN

## 2016-06-30 MED ORDER — ASPIRIN EC 81 MG PO TBEC
81.0000 mg | DELAYED_RELEASE_TABLET | Freq: Every day | ORAL | Status: DC
  Filled 2016-06-30: qty 1

## 2016-06-30 MED ORDER — ROSUVASTATIN CALCIUM 20 MG PO TABS
20.0000 mg | ORAL_TABLET | Freq: Every day | ORAL | Status: DC
  Administered 2016-07-01: 20 mg via ORAL
  Filled 2016-06-30: qty 1

## 2016-06-30 MED ORDER — MOMETASONE FURO-FORMOTEROL FUM 200-5 MCG/ACT IN AERO
2.0000 | INHALATION_SPRAY | Freq: Two times a day (BID) | RESPIRATORY_TRACT | Status: DC
  Administered 2016-06-30 – 2016-07-01 (×2): 2 via RESPIRATORY_TRACT
  Filled 2016-06-30: qty 8.8

## 2016-06-30 MED ORDER — MORPHINE SULFATE (PF) 2 MG/ML IV SOLN: 2.0000 mg | INTRAVENOUS | Status: DC | PRN

## 2016-06-30 MED ORDER — PRAMIPEXOLE DIHYDROCHLORIDE 0.25 MG PO TABS
0.1250 mg | ORAL_TABLET | Freq: Every day | ORAL | Status: DC
  Administered 2016-07-01: 0.125 mg via ORAL
  Filled 2016-06-30 (×3): qty 1

## 2016-06-30 MED ORDER — MOMETASONE FURO-FORMOTEROL FUM 200-5 MCG/ACT IN AERO
INHALATION_SPRAY | RESPIRATORY_TRACT | Status: AC
  Filled 2016-06-30: qty 8.8

## 2016-06-30 MED ORDER — MONTELUKAST SODIUM 5 MG PO CHEW
5.0000 mg | CHEWABLE_TABLET | Freq: Every day | ORAL | Status: DC
  Administered 2016-06-30: 5 mg via ORAL
  Filled 2016-06-30 (×3): qty 1

## 2016-06-30 MED ORDER — ENOXAPARIN SODIUM 40 MG/0.4ML ~~LOC~~ SOLN
40.0000 mg | SUBCUTANEOUS | Status: DC
  Filled 2016-06-30: qty 0.4

## 2016-06-30 MED ORDER — ASPIRIN 81 MG PO CHEW
243.0000 mg | CHEWABLE_TABLET | Freq: Once | ORAL | Status: AC
  Administered 2016-06-30: 243 mg via ORAL
  Filled 2016-06-30: qty 3

## 2016-06-30 MED ORDER — NITROGLYCERIN 0.4 MG SL SUBL
0.4000 mg | SUBLINGUAL_TABLET | SUBLINGUAL | Status: AC | PRN
  Administered 2016-06-30 (×3): 0.4 mg via SUBLINGUAL
  Filled 2016-06-30: qty 1

## 2016-06-30 MED ORDER — MORPHINE SULFATE (PF) 4 MG/ML IV SOLN: 4.0000 mg | INTRAVENOUS | Status: DC | PRN

## 2016-06-30 MED ORDER — ONDANSETRON HCL 4 MG/2ML IJ SOLN: 4.0000 mg | Freq: Four times a day (QID) | INTRAMUSCULAR | Status: DC | PRN

## 2016-06-30 MED ORDER — CLOPIDOGREL BISULFATE 75 MG PO TABS
75.0000 mg | ORAL_TABLET | Freq: Every day | ORAL | Status: DC
  Filled 2016-06-30: qty 1

## 2016-06-30 MED ORDER — ALBUTEROL SULFATE (2.5 MG/3ML) 0.083% IN NEBU
2.5000 mg | INHALATION_SOLUTION | RESPIRATORY_TRACT | Status: AC | PRN
  Administered 2016-06-30: 2.5 mg via RESPIRATORY_TRACT
  Filled 2016-06-30: qty 3

## 2016-06-30 MED ORDER — CYCLOBENZAPRINE HCL 10 MG PO TABS
10.0000 mg | ORAL_TABLET | Freq: Three times a day (TID) | ORAL | Status: DC
  Administered 2016-07-01: 10 mg via ORAL
  Filled 2016-06-30 (×2): qty 1

## 2016-06-30 NOTE — ED Triage Notes (Signed)
Pt states he started having L. Arm pain this morning and that the pain spread to his chest and L. Side. Pt with hx of MI.

## 2016-06-30 NOTE — ED Provider Notes (Signed)
[facility]-EMER[facility]ENCY DEPT Provider Note   CSN: [id number] Arrival date & time: 06/30/16  1941     History   Chief Complaint Chief Complaint  Patient presents with  . Chest Pain    HPI John Marsh is a 71 y.o. male.  HPI  Pt was seen at 2030. Per pt, c/o gradual onset and persistence of waxing and waning left sided chest "pain" since this morning. Describes the CP as "tightness" with intermittent "sharp pains." Has been associated with SOB. Pt also c/o constant left upper arm "pain" that began this morning. Describes left upper arm pain as "aching," worsens with movement and palpation of the area. Denies injury, no rash, no fevers, no palpitations, no abd pain, no N/V/D, no focal motor weakness, no tingling/numbness in extremities.   Past Medical History:  Diagnosis Date  . COPD (chronic obstructive pulmonary disease) (HCC)   . Coronary heart disease   . Emphysema of lung (HCC)   . Hyperlipidemia   . Hypertension   . MI (myocardial infarction)   . Multiple lung nodules on CT   . Nocturnal hypoxia   . Peripheral arterial disease (HCC)   . Shortness of breath dyspnea     Patient Active Problem List   Diagnosis Date Noted  . PVD (peripheral vascular disease) with claudication (HCC) 07/27/2015  . COPD exacerbation (HCC) 07/03/2015  . Tachycardia 07/03/2015  . Chronic respiratory failure with hypoxia (HCC) 07/03/2015  . Dyspnea 06/03/2015  . Allergic rhinitis 06/03/2015  . Emphysema lung (HCC) 03/04/2015  . Nocturnal hypoxia 03/04/2015  . Pulmonary nodules 08/10/2014  . HYPERTRIGLYCERIDEMIA 08/16/2006  . HYPERLIPIDEMIA 08/16/2006  . TOBACCO DEPENDENCE 08/16/2006  . HYPERTENSION, BENIGN SYSTEMIC 08/16/2006  . CAD (coronary artery disease), native coronary artery 08/16/2006  . COPD (chronic obstructive pulmonary disease) with emphysema (HCC) 08/16/2006    Past Surgical History:  Procedure Laterality Date  . CHOLECYSTECTOMY    . NASAL SEPTUM SURGERY    . PERIPHERAL  VASCULAR CATHETERIZATION N/A 07/27/2015   Procedure: Abdominal Aortogram;  Surgeon: Chuck Hinthristopher S Dickson, MD;  [facility];  Service: Cardiovascular;  Laterality: N/A;  . PERIPHERAL VASCULAR CATHETERIZATION Bilateral 07/27/2015   Procedure: Lower Extremity Angiography;  Surgeon: Chuck Hinthristopher S Dickson, MD;  [facility];  Service: Cardiovascular;  Laterality: Bilateral;  . stent placed         Home Medications    Prior to Admission medications   Medication Sig Start Date End Date Taking? Authorizing Provider  albuterol (PROVENTIL HFA;VENTOLIN HFA) 108 (90 BASE) MCG/ACT inhaler Inhale 2 puffs into the lungs every 6 (six) hours as needed for wheezing or shortness of breath.    Historical Provider, MD  aspirin EC 81 MG tablet Take 81 mg by mouth daily.    Historical Provider, MD  budesonide-formoterol (SYMBICORT) 160-4.5 MCG/ACT inhaler Inhale 2 puffs into the lungs 2 (two) times daily.    Historical Provider, MD  clopidogrel (PLAVIX) 75 MG tablet Take 75 mg by mouth daily.    Historical Provider, MD  montelukast (SINGULAIR) 5 MG chewable tablet Chew 5 mg by mouth at bedtime. 11/09/15   Historical Provider, MD  montelukast (SINGULAIR) 5 MG chewable tablet CHEW 1 TABLET BY MOUTH AT BEDTIME 02/01/16   Roslynn AmbleJennings E Nestor, MD  nitroGLYCERIN (NITROSTAT) 0.4 MG SL tablet Place 0.4 mg under the tongue every 5 (five) minutes as needed for chest pain. x3 doses as needed for chest pain    Historical Provider, MD  rosuvastatin (CRESTOR) 20 MG  tablet Take 20 mg by mouth at bedtime.    Historical Provider, MD  Spacer/Aero-Holding Chambers (AEROCHAMBER Z-STAT PLUS) inhaler Use as instructed 03/04/15   Roslynn Amble, MD  tiotropium (SPIRIVA) 18 MCG inhalation capsule Place 18 mcg into inhaler and inhale daily.    Historical Provider, MD  triamcinolone (NASACORT ALLERGY 24HR) 55 MCG/ACT AERO nasal inhaler Place 2 sprays into the nose daily.    Historical Provider, MD    Family  History Family History  Problem Relation Age of Onset  . Heart disease Mother     died at 37  . Heart disease Father   . Heart disease Brother   . Heart disease Sister   . Lung disease Neg Hx     Social History Social History  Substance Use Topics  . Smoking status: Current Every Day Smoker    Packs/day: 0.50    Years: 55.00    Types: Cigarettes    Start date: 08/01/1959  . Smokeless tobacco: Never Used     Comment: 1/2 ppd 06/03/15 - peak 3ppd   taking Chantix currently 07-20-2015  . Alcohol use No     Allergies   Patient has no known allergies.   Review of Systems Review of Systems ROS: Statement: All systems negative except as marked or noted in the HPI; Constitutional: Negative for fever and chills. ; ; Eyes: Negative for eye pain, redness and discharge. ; ; ENMT: Negative for ear pain, hoarseness, nasal congestion, sinus pressure and sore throat. ; ; Cardiovascular: +SOB, CP. Negative for palpitations, diaphoresis, and peripheral edema. ; ; Respiratory: Negative for cough, wheezing and stridor. ; ; Gastrointestinal: Negative for nausea, vomiting, diarrhea, abdominal pain, blood in stool, hematemesis, jaundice and rectal bleeding. . ; ; Genitourinary: Negative for dysuria, flank pain and hematuria. ; ; Musculoskeletal: Negative for back pain and neck pain. Negative for swelling and trauma.; ; Skin: Negative for pruritus, rash, abrasions, blisters, bruising and skin lesion.; ; Neuro: Negative for headache, lightheadedness and neck stiffness. Negative for weakness, altered level of consciousness, altered mental status, extremity weakness, paresthesias, involuntary movement, seizure and syncope.       Physical Exam Updated Vital Signs BP 174/76   Pulse 84   Temp 98.2 F (36.8 C) (Oral)   Resp (!) 27   Ht 6\' 1"  (1.854 m)   Wt 164 lb (74.4 kg)   SpO2 98%   BMI 21.64 kg/m   Physical Exam 2035: Physical examination:  Nursing notes reviewed; Vital signs and O2 SAT  reviewed;  Constitutional: Well developed, Well nourished, Well hydrated, In no acute distress; Head:  Normocephalic, atraumatic; Eyes: EOMI, PERRL, No scleral icterus; ENMT: Mouth and pharynx normal, Mucous membranes moist; Neck: Supple, Full range of motion, No lymphadenopathy; Cardiovascular: Regular rate and rhythm, No gallop; Respiratory: Breath sounds clear & equal bilaterally, No wheezes.  Speaking full sentences with ease, Normal respiratory effort/excursion; Chest: Nontender, Movement normal; Abdomen: Soft, Nontender, Nondistended, Normal bowel sounds; Genitourinary: No CVA tenderness; Extremities: Pulses normal, +TTP left AC joint area, no deformity. No edema, No calf edema or asymmetry.; Neuro: AA&Ox3, Major CN grossly intact.  Speech clear. No gross focal motor or sensory deficits in extremities.; Skin: Color normal, Warm, Dry.   ED Treatments / Results  Labs (all labs ordered are listed, but only abnormal results are displayed)   EKG  EKG Interpretation  Date/Time:  Friday June 30 2016 19:54:53 EST Ventricular Rate:  87 PR Interval:  142 QRS Duration: 78 QT Interval:  370  QTC Calculation: 445 R Axis:   80 Text Interpretation:  Normal sinus rhythm Normal ECG When compared with ECG of 07/03/2015 No significant change was found Confirmed by [facility]  MD, Nicholos Johns [phone]) on 06/30/2016 8:39:54 PM       Radiology   Procedures Procedures (including critical care time)  Medications Ordered in ED Medications  aspirin chewable tablet 243 mg (not administered)  nitroGLYCERIN (NITROSTAT) SL tablet 0.4 mg (not administered)  morphine 4 MG/ML injection 4 mg (not administered)     Initial Impression / Assessment and Plan / ED Course  I have reviewed the triage vital signs and the nursing notes.  Pertinent labs & imaging results that were available during my care of the patient were reviewed by me and considered in my medical decision making (see chart for  details).  MDM Reviewed: previous chart, nursing note and vitals Reviewed previous: labs and ECG Interpretation: labs, ECG and x-ray   Results for orders placed or performed during the hospital encounter of 06/30/16  CBC with Differential  Result Value Ref Range   WBC 7.0 4.0 - 10.5 K/uL   RBC 4.95 4.22 - 5.81 MIL/uL   Hemoglobin 14.4 13.0 - 17.0 g/dL   HCT 60.4 54.0 - 98.1 %   MCV 86.3 78.0 - 100.0 fL   MCH 29.1 26.0 - 34.0 pg   MCHC 33.7 30.0 - 36.0 g/dL   RDW 19.1 47.8 - 29.5 %   Platelets 198 150 - 400 K/uL   Neutrophils Relative % 70 %   Neutro Abs 4.9 1.7 - 7.7 K/uL   Lymphocytes Relative 19 %   Lymphs Abs 1.3 0.7 - 4.0 K/uL   Monocytes Relative 9 %   Monocytes Absolute 0.6 0.1 - 1.0 K/uL   Eosinophils Relative 1 %   Eosinophils Absolute 0.1 0.0 - 0.7 K/uL   Basophils Relative 1 %   Basophils Absolute 0.0 0.0 - 0.1 K/uL  Comprehensive metabolic panel  Result Value Ref Range   Sodium 138 135 - 145 mmol/L   Potassium 4.2 3.5 - 5.1 mmol/L   Chloride 100 (L) 101 - 111 mmol/L   CO2 29 22 - 32 mmol/L   Glucose, Bld 110 (H) 65 - 99 mg/dL   BUN 16 6 - 20 mg/dL   Creatinine, Ser 6.21 0.61 - 1.24 mg/dL   Calcium 9.2 8.9 - 30.8 mg/dL   Total Protein 7.2 6.5 - 8.1 g/dL   Albumin 4.0 3.5 - 5.0 g/dL   AST 16 15 - 41 U/L   ALT 15 (L) 17 - 63 U/L   Alkaline Phosphatase 80 38 - 126 U/L   Total Bilirubin 0.3 0.3 - 1.2 mg/dL   GFR calc non Af Amer >60 >60 mL/min   GFR calc Af Amer >60 >60 mL/min   Anion gap 9 5 - 15  Troponin I  Result Value Ref Range   Troponin I <0.03 <0.03 ng/mL   Dg Chest 2 View Result Date: 06/30/2016 CLINICAL DATA:  Chest pain, left arm pain EXAM: CHEST  2 VIEW COMPARISON:  07/03/2015 FINDINGS: Hyperinflation is present. Diffuse interstitial coarsening consistent with chronic change. Emphysematous disease bilaterally. Stable small nodules in the left upper lobe and scarring in the right upper lobe. No acute consolidation or effusion. Stable  cardiomediastinal silhouette. No pneumothorax. IMPRESSION: Hyperinflation with emphysematous changes present. No radiographic evidence for acute cardiopulmonary abnormality Electronically Signed   By: Jasmine Pang M.D.   On: 06/30/2016 21:09    2135:  Pt's  symptoms improved after ASA and SL ntg. Pt with significant cardiac hx, HEART score 5; will observation admit. Dx and testing d/w pt and family.  Questions answered.  Verb understanding, agreeable to admit. T/C to [facility]. Sharl Ma, case discussed, including:  HPI, pertinent PM/SHx, VS/PE, dx testing, ED course and treatment:  Agreeable to admit, requests to write temporary orders, obtain observation tele bed to team APAdmits.     Final Clinical Impressions(s) / ED Diagnoses   Final diagnoses:  None    New Prescriptions New Prescriptions   No medications on file     Samuel Jester, DO 07/04/16 1656

## 2016-06-30 NOTE — H&P (Addendum)
TRH H&P    Patient Demographics:    John Marsh, is a 71 y.o. male  MRN: [id number]  DOB - [birth year]  Admit Date - 06/30/2016  Referring MD/NP/PA: Dr. Clarene DukeMcManus  Outpatient Primary MD for the patient is Eartha InchBADGER,MICHAEL C, MD  Patient coming from: Home  Chief Complaint  Patient presents with  . Chest Pain      HPI:    John Marsh  is a 71 y.o. male, with history of CAD status post stent placement, on dual antiplatelet therapy with aspirin and Plavix, COPD came to hospital with left-sided chest pain and shoulder pain which started this morning. As the patient, pain was radiating to left upper arm. He denies shortness of breath. No nausea or vomiting. At this time he denies chest pain but pain is mainly located left shoulder which becomes worse on movement of left shoulder especially abduction of left shoulder.  In the ED, EKG showed normal sinus rhythm. Troponin was negative.    Review of systems:    In addition to the HPI above,  No Fever-chills, No Headache, No changes with Vision or hearing, No problems swallowing food or Liquids, No Abdominal pain, No Nausea or Vomiting, bowel movements are regular, No Blood in stool or Urine, No dysuria, No new skin rashes or bruises, No new joints pains-aches,  No new weakness, tingling, numbness in any extremity, No recent weight gain or loss, No polyuria, polydypsia or polyphagia, No significant Mental Stressors.  A full 10 point Review of Systems was done, except as stated above, all other Review of Systems were negative.   With Past History of the following :    Past Medical History:  Diagnosis Date  . COPD (chronic obstructive pulmonary disease) (HCC)   . Coronary heart disease   . Emphysema of lung (HCC)   . Hyperlipidemia   . Hypertension   . MI (myocardial infarction)   . Multiple lung nodules on CT   . Nocturnal hypoxia   . Peripheral  arterial disease (HCC)   . Shortness of breath dyspnea       Past Surgical History:  Procedure Laterality Date  . CHOLECYSTECTOMY    . NASAL SEPTUM SURGERY    . PERIPHERAL VASCULAR CATHETERIZATION N/A 07/27/2015   Procedure: Abdominal Aortogram;  Surgeon: Chuck Hinthristopher S Dickson, MD;  [facility];  Service: Cardiovascular;  Laterality: N/A;  . PERIPHERAL VASCULAR CATHETERIZATION Bilateral 07/27/2015   Procedure: Lower Extremity Angiography;  Surgeon: Chuck Hinthristopher S Dickson, MD;  [facility];  Service: Cardiovascular;  Laterality: Bilateral;  . stent placed        Social History:      Social History  Substance Use Topics  . Smoking status: Current Every Day Smoker    Packs/day: 0.50    Years: 55.00    Types: Cigarettes    Start date: 08/01/1959  . Smokeless tobacco: Never Used     Comment: 1/2 ppd 06/03/15 - peak 3ppd   taking Chantix currently 07-20-2015  . Alcohol use No  Family History :     Family History  Problem Relation Age of Onset  . Heart disease Mother     died at 27  . Heart disease Father   . Heart disease Brother   . Heart disease Sister   . Lung disease Neg Hx       Home Medications:   Prior to Admission medications   Medication Sig Start Date End Date Taking? Authorizing Provider  albuterol (PROVENTIL HFA;VENTOLIN HFA) 108 (90 BASE) MCG/ACT inhaler Inhale 2 puffs into the lungs every 6 (six) hours as needed for wheezing or shortness of breath. Normally takes 2 puffs every morning and takes daily as needed for rescue thereafter   Yes Historical Provider, MD  aspirin EC 81 MG tablet Take 81 mg by mouth daily.   Yes Historical Provider, MD  budesonide-formoterol (SYMBICORT) 160-4.5 MCG/ACT inhaler Inhale 2 puffs into the lungs 2 (two) times daily.   Yes Historical Provider, MD  clopidogrel (PLAVIX) 75 MG tablet Take 75 mg by mouth daily.   Yes Historical Provider, MD  montelukast (SINGULAIR) 5 MG chewable tablet CHEW 1  TABLET BY MOUTH AT BEDTIME 02/01/16  Yes Roslynn Amble, MD  nitroGLYCERIN (NITROSTAT) 0.4 MG SL tablet Place 0.4 mg under the tongue every 5 (five) minutes as needed for chest pain. x3 doses as needed for chest pain   Yes Historical Provider, MD  pramipexole (MIRAPEX) 0.125 MG tablet Take 1 tablet by mouth at bedtime. 06/21/16  Yes Historical Provider, MD  rosuvastatin (CRESTOR) 20 MG tablet Take 20 mg by mouth at bedtime.   Yes Historical Provider, MD  tiotropium (SPIRIVA) 18 MCG inhalation capsule Place 18 mcg into inhaler and inhale daily.   Yes Historical Provider, MD  triamcinolone (NASACORT ALLERGY 24HR) 55 MCG/ACT AERO nasal inhaler Place 2 sprays into the nose daily.   Yes Historical Provider, MD  UNKNOWN TO PATIENT Apply 1 application topically daily. Applied to nose daily-Prescribed-Name is unknown   Yes Historical Provider, MD  Spacer/Aero-Holding Chambers (AEROCHAMBER Z-STAT PLUS) inhaler Use as instructed 03/04/15   Roslynn Amble, MD     Allergies:    No Known Allergies   Physical Exam:   Vitals  Blood pressure 135/69, pulse 76, temperature 97.9 F (36.6 C), temperature source Oral, resp. rate 23, height 6\' 1"  (1.854 m), weight 74.4 kg (164 lb), SpO2 98 %.  1.  General: Appears in no acute distress  2. Psychiatric:  Intact judgement and  insight, awake alert, oriented x 3.  3. Neurologic: No focal neurological deficits, all cranial nerves intact.Strength 5/5 all 4 extremities, sensation intact all 4 extremities, plantars down going.  4. Eyes :  anicteric sclerae, moist conjunctivae with no lid lag. PERRLA.  5. ENMT:  Oropharynx clear with moist mucous membranes and good dentition  6. Neck:  supple, no cervical lymphadenopathy appriciated, No thyromegaly  7. Respiratory : Normal respiratory effort, good air movement bilaterally,clear to  auscultation bilaterally  8. Cardiovascular : RRR, no gallops, rubs or murmurs, no leg edema  9. Gastrointestinal:    Positive bowel sounds, abdomen soft, non-tender to palpation,no hepatosplenomegaly, no rigidity or guarding       10. Skin:  No cyanosis, normal texture and turgor, no rash, lesions or ulcers  11.Musculoskeletal:  Left shoulder tenderness to palpation, range of motion limited on abduction of left shoulder    Data Review:    CBC  Recent Labs Lab 06/30/16 2031  WBC 7.0  HGB 14.4  HCT 42.7  PLT 198  MCV 86.3  MCH 29.1  MCHC 33.7  RDW 13.3  LYMPHSABS 1.3  MONOABS 0.6  EOSABS 0.1  BASOSABS 0.0   ------------------------------------------------------------------------------------------------------------------  Chemistries   Recent Labs Lab 06/30/16 2031  NA 138  K 4.2  CL 100*  CO2 29  GLUCOSE 110*  BUN 16  CREATININE 1.05  CALCIUM 9.2  AST 16  ALT 15*  ALKPHOS 80  BILITOT 0.3   ------------------------------------------------------------------------------------------------------------------  ------------------------------------------------------------------------------------------------------------------ GFR: Estimated Creatinine Clearance: 67.9 mL/min (by C-G formula based on SCr of 1.05 mg/dL). Liver Function Tests:  Recent Labs Lab 06/30/16 2031  AST 16  ALT 15*  ALKPHOS 80  BILITOT 0.3  PROT 7.2  ALBUMIN 4.0   No results for input(s): LIPASE, AMYLASE in the last 168 hours. No results for input(s): AMMONIA in the last 168 hours. Coagulation Profile: No results for input(s): INR, PROTIME in the last 168 hours. Cardiac Enzymes:  Recent Labs Lab 06/30/16 2031  TROPONINI <0.03     --------------------------------------------------------------------------------------------------------------- Urine analysis:    Component Value Date/Time   COLORURINE YELLOW 08/08/2012 1910   APPEARANCEUR CLEAR 08/08/2012 1910   LABSPEC 1.005 08/08/2012 1910   PHURINE 7.0 08/08/2012 1910   GLUCOSEU NEGATIVE 08/08/2012 1910   HGBUR NEGATIVE 08/08/2012  1910   BILIRUBINUR NEGATIVE 08/08/2012 1910   KETONESUR NEGATIVE 08/08/2012 1910   PROTEINUR NEGATIVE 08/08/2012 1910   UROBILINOGEN 0.2 08/08/2012 1910   NITRITE NEGATIVE 08/08/2012 1910   LEUKOCYTESUR TRACE (A) 08/08/2012 1910      Imaging Results:    Dg Chest 2 View  Result Date: 06/30/2016 CLINICAL DATA:  Chest pain, left arm pain EXAM: CHEST  2 VIEW COMPARISON:  07/03/2015 FINDINGS: Hyperinflation is present. Diffuse interstitial coarsening consistent with chronic change. Emphysematous disease bilaterally. Stable small nodules in the left upper lobe and scarring in the right upper lobe. No acute consolidation or effusion. Stable cardiomediastinal silhouette. No pneumothorax. IMPRESSION: Hyperinflation with emphysematous changes present. No radiographic evidence for acute cardiopulmonary abnormality Electronically Signed   By: Jasmine Pang M.D.   On: 06/30/2016 21:09    My personal review of EKG: Rhythm NSR   Assessment & Plan:    Active Problems:   Chest pain   1. Chest pain- placed under observation, morphine when necessary for pain, will cycle troponin every 6 hours 3. 2. Left shoulder pain- patient has severe tenderness in the left shoulder joint, with significant limitation of movement. Will check x-ray of left shoulder, might need MRI of left shoulder as outpatient.Continue morphine. For pain, Flexeril 10 mg 3 times a day. 3. COPD-stable, currently no exacerbation. Continue Symbicort, Singulair 4. CAD-stable, continue aspirin, Plavix, Crestor   DVT Prophylaxis-   Lovenox   AM Labs Ordered, also please review Full Orders  Family Communication: Admission, patients condition and plan of care including tests being ordered have been discussed with the patient and his wife at bedside who indicate understanding and agree with the plan and Code Status.  Code Status:  Full code  Admission status: Observation    Time spent in minutes : 60 minutes   LAMA,GAGAN S M.D on  06/30/2016 at 11:03 PM  Between 7am to 7pm - Pager - [phone]. After 7pm go to [email or web address] - password [facility]  [facility] - Office  [phone]

## 2016-07-01 DIAGNOSIS — I10 Essential (primary) hypertension: Secondary | ICD-10-CM | POA: Diagnosis not present

## 2016-07-01 DIAGNOSIS — I2510 Atherosclerotic heart disease of native coronary artery without angina pectoris: Secondary | ICD-10-CM

## 2016-07-01 DIAGNOSIS — R079 Chest pain, unspecified: Secondary | ICD-10-CM | POA: Diagnosis not present

## 2016-07-01 DIAGNOSIS — J438 Other emphysema: Secondary | ICD-10-CM | POA: Diagnosis not present

## 2016-07-01 DIAGNOSIS — I739 Peripheral vascular disease, unspecified: Secondary | ICD-10-CM

## 2016-07-01 LAB — TROPONIN I: Troponin I: <0.03 ng/mL

## 2016-07-01 MED ORDER — IBUPROFEN 600 MG PO TABS: 600.0000 mg | ORAL_TABLET | Freq: Four times a day (QID) | ORAL | 0 refills | Status: DC | PRN

## 2016-07-01 MED ORDER — PANTOPRAZOLE SODIUM 40 MG PO TBEC: 40.0000 mg | DELAYED_RELEASE_TABLET | Freq: Every day | ORAL | 0 refills | Status: AC

## 2016-07-01 MED ORDER — MONTELUKAST SODIUM 10 MG PO TABS: 5.0000 mg | ORAL_TABLET | Freq: Every day | ORAL | Status: DC

## 2016-07-01 MED ORDER — KETOROLAC TROMETHAMINE 30 MG/ML IJ SOLN
30.0000 mg | Freq: Once | INTRAMUSCULAR | Status: AC
Start: 2016-07-01 — End: 2016-07-01
  Administered 2016-07-01: 30 mg via INTRAVENOUS
  Filled 2016-07-01: qty 1

## 2016-07-01 NOTE — Discharge Summary (Signed)
Physician Discharge Summary  John HeckHugh T Weirauch ZOX:[id number] DOB: 01/19/1946 DOA: 06/30/2016  PCP: Eartha InchBADGER,MICHAEL C, MD  Admit date: 06/30/2016 Discharge date: 07/01/2016  Admitted From: Home Disposition:  Home  Recommendations for Outpatient Follow-up:  1. Follow up with PCP in 1-2 weeks 2. Patient has been referred to orthopedics for left shoulder pain  Home Health: Equipment/Devices:  Discharge Condition: Stable CODE STATUS: Full Diet recommendation: Heart Healthy   Brief/Interim Summary: 71 year old male with a history of coronary artery disease who presents to the hospital with complaints of left shoulder pain which radiated to his left chest and down his left trunk. He reports onset at rest. This progressively gotten worse. Since he has a history of coronary artery disease, he was referred for admission. Patient ruled out for ACS with negative cardiac markers. He did not have any acute EKG changes. He did not have any further chest pain in the hospital. He reports improvement of his pain when he holds his arm close to his chest. His pain is worse when he tries to move his left arm. X-rays of his left shoulder were unremarkable. Case was discussed with Dr. Romeo AppleHarrison who felt that the patient may have a bursitis and agreed to see the patient in the office next week. The patient will be provided a sling for comfort as well as nonsteroidal anti-inflammatories. He is otherwise stable for discharge. The remainder of his medical issues remained stable.  Discharge Diagnoses:  Active Problems:   HLD (hyperlipidemia)   HYPERTENSION, BENIGN SYSTEMIC   CAD (coronary artery disease), native coronary artery   COPD (chronic obstructive pulmonary disease) with emphysema (HCC)   PVD (peripheral vascular disease) with claudication (HCC)   Chest pain    Discharge Instructions  Discharge Instructions    Diet - low sodium heart healthy    Complete by:  As directed    Increase activity slowly     Complete by:  As directed      Allergies as of 07/01/2016   No Known Allergies     Medication List    TAKE these medications   AEROCHAMBER Z-STAT PLUS inhaler Use as instructed   albuterol 108 (90 Base) MCG/ACT inhaler Commonly known as:  PROVENTIL HFA;VENTOLIN HFA Inhale 2 puffs into the lungs every 6 (six) hours as needed for wheezing or shortness of breath. Normally takes 2 puffs every morning and takes daily as needed for rescue thereafter   aspirin EC 81 MG tablet Take 81 mg by mouth daily.   budesonide-formoterol 160-4.5 MCG/ACT inhaler Commonly known as:  SYMBICORT Inhale 2 puffs into the lungs 2 (two) times daily.   clopidogrel 75 MG tablet Commonly known as:  PLAVIX Take 75 mg by mouth daily.   ibuprofen 600 MG tablet Commonly known as:  ADVIL,MOTRIN Take 1 tablet (600 mg total) by mouth every 6 (six) hours as needed.   montelukast 5 MG chewable tablet Commonly known as:  SINGULAIR CHEW 1 TABLET BY MOUTH AT BEDTIME   NASACORT ALLERGY 24HR 55 MCG/ACT Aero nasal inhaler Generic drug:  triamcinolone Place 2 sprays into the nose daily.   nitroGLYCERIN 0.4 MG SL tablet Commonly known as:  NITROSTAT Place 0.4 mg under the tongue every 5 (five) minutes as needed for chest pain. x3 doses as needed for chest pain   pantoprazole 40 MG tablet Commonly known as:  PROTONIX Take 1 tablet (40 mg total) by mouth daily.   pramipexole 0.125 MG tablet Commonly known as:  MIRAPEX Take 1 tablet by mouth  at bedtime.   rosuvastatin 20 MG tablet Commonly known as:  CRESTOR Take 20 mg by mouth at bedtime.   tiotropium 18 MCG inhalation capsule Commonly known as:  SPIRIVA Place 18 mcg into inhaler and inhale daily.   UNKNOWN TO PATIENT Apply 1 application topically daily. Applied to nose daily-Prescribed-Name is unknown      Follow-up Information    Fuller Canada, MD Follow up.   Specialties:  Orthopedic Surgery, Radiology Why:  call on monday for  appointment Contact information: [address] [place] [address] [phone]          No Known Allergies  Consultations:     Procedures/Studies: Dg Chest 2 View  Result Date: 06/30/2016 CLINICAL DATA:  Chest pain, left arm pain EXAM: CHEST  2 VIEW COMPARISON:  07/03/2015 FINDINGS: Hyperinflation is present. Diffuse interstitial coarsening consistent with chronic change. Emphysematous disease bilaterally. Stable small nodules in the left upper lobe and scarring in the right upper lobe. No acute consolidation or effusion. Stable cardiomediastinal silhouette. No pneumothorax. IMPRESSION: Hyperinflation with emphysematous changes present. No radiographic evidence for acute cardiopulmonary abnormality Electronically Signed   By: Jasmine Pang M.D.   On: 06/30/2016 21:09   Dg Shoulder 1v Left  Result Date: 06/30/2016 CLINICAL DATA:  Acute onset of left shoulder pain. Initial encounter. EXAM: LEFT SHOULDER - 1 VIEW COMPARISON:  None. FINDINGS: There is no evidence of fracture or dislocation. The left humeral head is seated within the glenoid fossa. Mild degenerative change is noted at the left acromioclavicular joint. No significant soft tissue abnormalities are seen. The visualized portions of the left lung are clear. A calcified granuloma is noted at the left midlung zone. IMPRESSION: No evidence of fracture or dislocation. Electronically Signed   By: Roanna Raider M.D.   On: 06/30/2016 23:25      Subjective: No further chest pain. Continues to have pain and left shoulder which is worse with movement  Discharge Exam: Vitals:   06/30/16 2251 07/01/16 0638  BP: 135/69 (!) 144/73  Pulse: 76 62  Resp:  20  Temp: 97.9 F (36.6 C) 98.6 F (37 C)   Vitals:   06/30/16 2342 06/30/16 2347 07/01/16 0638 07/01/16 0818  BP:   (!) 144/73   Pulse:   62   Resp:   20   Temp:   98.6 F (37 C)   TempSrc:   Oral   SpO2: 96%  98% 96%  Weight:  73.2 kg (161 lb 4.8 oz)    Height:         General: Pt is alert, awake, not in acute distress Cardiovascular: RRR, S1/S2 +, no rubs, no gallops Respiratory: CTA bilaterally, no wheezing, no rhonchi Abdominal: Soft, NT, ND, bowel sounds + Extremities: Tenderness to palpation over left shoulder which increases with range of motion. No erythema, warmth or significant swelling noted in shoulder.    The results of significant diagnostics from this hospitalization (including imaging, microbiology, ancillary and laboratory) are listed below for reference.     Microbiology: No results found for this or any previous visit (from the past 240 hour(s)).   Labs: BNP (last 3 results)  Recent Labs  07/03/15 2022  BNP 19.8   Basic Metabolic Panel:  Recent Labs Lab 06/30/16 2031  NA 138  K 4.2  CL 100*  CO2 29  GLUCOSE 110*  BUN 16  CREATININE 1.05  CALCIUM 9.2   Liver Function Tests:  Recent Labs Lab 06/30/16 2031  AST 16  ALT 15*  ALKPHOS 80  BILITOT 0.3  PROT 7.2  ALBUMIN 4.0   No results for input(s): LIPASE, AMYLASE in the last 168 hours. No results for input(s): AMMONIA in the last 168 hours. CBC:  Recent Labs Lab 06/30/16 2031  WBC 7.0  NEUTROABS 4.9  HGB 14.4  HCT 42.7  MCV 86.3  PLT 198   Cardiac Enzymes:  Recent Labs Lab 06/30/16 2031  TROPONINI <0.03   BNP: Invalid input(s): POCBNP CBG: No results for input(s): GLUCAP in the last 168 hours. D-Dimer No results for input(s): DDIMER in the last 72 hours. Hgb A1c No results for input(s): HGBA1C in the last 72 hours. Lipid Profile No results for input(s): CHOL, HDL, LDLCALC, TRIG, CHOLHDL, LDLDIRECT in the last 72 hours. Thyroid function studies No results for input(s): TSH, T4TOTAL, T3FREE, THYROIDAB in the last 72 hours.  Invalid input(s): FREET3 Anemia work up No results for input(s): VITAMINB12, FOLATE, FERRITIN, TIBC, IRON, RETICCTPCT in the last 72 hours. Urinalysis    Component Value Date/Time   COLORURINE YELLOW  08/08/2012 1910   APPEARANCEUR CLEAR 08/08/2012 1910   LABSPEC 1.005 08/08/2012 1910   PHURINE 7.0 08/08/2012 1910   GLUCOSEU NEGATIVE 08/08/2012 1910   HGBUR NEGATIVE 08/08/2012 1910   BILIRUBINUR NEGATIVE 08/08/2012 1910   KETONESUR NEGATIVE 08/08/2012 1910   PROTEINUR NEGATIVE 08/08/2012 1910   UROBILINOGEN 0.2 08/08/2012 1910   NITRITE NEGATIVE 08/08/2012 1910   LEUKOCYTESUR TRACE (A) 08/08/2012 1910   Sepsis Labs Invalid input(s): PROCALCITONIN,  WBC,  LACTICIDVEN Microbiology No results found for this or any previous visit (from the past 240 hour(s)).   Time coordinating discharge: Over 30 minutes  SIGNED:   Erick Blinks, MD  [facility] 07/01/2016, 12:30 PM Pager   If 7PM-7AM, please contact night-coverage [email or web address] Password [email or web address]

## 2016-07-01 NOTE — Progress Notes (Signed)
Patient discharged with instructions, prescription, and care notes.  Verbalized understanding via teach back.  IV was removed and the site was WNL. Patient voiced no further complaints or concerns at the time of discharge.  Appointments scheduled per instructions.  Patient left the floor via w/c family  And staff in stable condition. 

## 2016-07-28 ENCOUNTER — Ambulatory Visit (HOSPITAL_COMMUNITY): Payer: Medicare Other

## 2016-09-19 ENCOUNTER — Inpatient Hospital Stay (HOSPITAL_COMMUNITY)
Admission: EM | Admit: 2016-09-19 | Discharge: 2016-09-26 | DRG: 190 | Disposition: A | Discharge status: Home / Self Care | Payer: Medicare Other | Attending: Family Medicine | Admitting: Family Medicine

## 2016-09-19 ENCOUNTER — Emergency Department (HOSPITAL_COMMUNITY): Payer: Medicare Other

## 2016-09-19 DIAGNOSIS — R0603 Acute respiratory distress: Secondary | ICD-10-CM

## 2016-09-19 DIAGNOSIS — R748 Abnormal levels of other serum enzymes: Secondary | ICD-10-CM | POA: Diagnosis not present

## 2016-09-19 DIAGNOSIS — J9622 Acute and chronic respiratory failure with hypercapnia: Secondary | ICD-10-CM | POA: Diagnosis present

## 2016-09-19 DIAGNOSIS — R7989 Other specified abnormal findings of blood chemistry: Secondary | ICD-10-CM | POA: Diagnosis present

## 2016-09-19 DIAGNOSIS — E43 Unspecified severe protein-calorie malnutrition: Secondary | ICD-10-CM | POA: Diagnosis present

## 2016-09-19 DIAGNOSIS — I25118 Atherosclerotic heart disease of native coronary artery with other forms of angina pectoris: Secondary | ICD-10-CM

## 2016-09-19 DIAGNOSIS — Z9981 Dependence on supplemental oxygen: Secondary | ICD-10-CM | POA: Diagnosis not present

## 2016-09-19 DIAGNOSIS — R778 Other specified abnormalities of plasma proteins: Secondary | ICD-10-CM

## 2016-09-19 DIAGNOSIS — Z7982 Long term (current) use of aspirin: Secondary | ICD-10-CM

## 2016-09-19 DIAGNOSIS — I248 Other forms of acute ischemic heart disease: Secondary | ICD-10-CM | POA: Diagnosis present

## 2016-09-19 DIAGNOSIS — J9621 Acute and chronic respiratory failure with hypoxia: Secondary | ICD-10-CM

## 2016-09-19 DIAGNOSIS — R0902 Hypoxemia: Secondary | ICD-10-CM

## 2016-09-19 DIAGNOSIS — I252 Old myocardial infarction: Secondary | ICD-10-CM

## 2016-09-19 DIAGNOSIS — J441 Chronic obstructive pulmonary disease with (acute) exacerbation: Principal | ICD-10-CM

## 2016-09-19 DIAGNOSIS — Z9861 Coronary angioplasty status: Secondary | ICD-10-CM

## 2016-09-19 DIAGNOSIS — I10 Essential (primary) hypertension: Secondary | ICD-10-CM | POA: Diagnosis present

## 2016-09-19 DIAGNOSIS — F17210 Nicotine dependence, cigarettes, uncomplicated: Secondary | ICD-10-CM | POA: Diagnosis present

## 2016-09-19 DIAGNOSIS — Z7951 Long term (current) use of inhaled steroids: Secondary | ICD-10-CM

## 2016-09-19 DIAGNOSIS — I739 Peripheral vascular disease, unspecified: Secondary | ICD-10-CM | POA: Diagnosis present

## 2016-09-19 DIAGNOSIS — J438 Other emphysema: Secondary | ICD-10-CM

## 2016-09-19 DIAGNOSIS — I2510 Atherosclerotic heart disease of native coronary artery without angina pectoris: Secondary | ICD-10-CM | POA: Diagnosis present

## 2016-09-19 DIAGNOSIS — Z7902 Long term (current) use of antithrombotics/antiplatelets: Secondary | ICD-10-CM | POA: Diagnosis not present

## 2016-09-19 DIAGNOSIS — J40 Bronchitis, not specified as acute or chronic: Secondary | ICD-10-CM | POA: Diagnosis present

## 2016-09-19 DIAGNOSIS — Z79899 Other long term (current) drug therapy: Secondary | ICD-10-CM

## 2016-09-19 DIAGNOSIS — Z6820 Body mass index (BMI) 20.0-20.9, adult: Secondary | ICD-10-CM | POA: Diagnosis not present

## 2016-09-19 DIAGNOSIS — J309 Allergic rhinitis, unspecified: Secondary | ICD-10-CM | POA: Diagnosis not present

## 2016-09-19 DIAGNOSIS — J9601 Acute respiratory failure with hypoxia: Secondary | ICD-10-CM

## 2016-09-19 DIAGNOSIS — F17200 Nicotine dependence, unspecified, uncomplicated: Secondary | ICD-10-CM | POA: Diagnosis not present

## 2016-09-19 DIAGNOSIS — J439 Emphysema, unspecified: Secondary | ICD-10-CM | POA: Diagnosis present

## 2016-09-19 HISTORY — DX: Other specified abnormal findings of blood chemistry: R79.89

## 2016-09-19 HISTORY — DX: Other specified abnormalities of plasma proteins: R77.8

## 2016-09-19 HISTORY — DX: Acute and chronic respiratory failure with hypoxia: J96.21

## 2016-09-19 LAB — I-STAT TROPONIN, ED: TROPONIN I, POC: 0.27 ng/mL — AB (ref 0.00–0.08)

## 2016-09-19 LAB — COMPREHENSIVE METABOLIC PANEL
ALK PHOS: 75 U/L (ref 38–126)
ALT: 24 U/L (ref 17–63)
AST: 31 U/L (ref 15–41)
Albumin: 4 g/dL (ref 3.5–5.0)
Anion gap: 12 (ref 5–15)
BILIRUBIN TOTAL: 0.6 mg/dL (ref 0.3–1.2)
BUN: 10 mg/dL (ref 6–20)
CHLORIDE: 102 mmol/L (ref 101–111)
CO2: 26 mmol/L (ref 22–32)
CREATININE: 0.95 mg/dL (ref 0.61–1.24)
Calcium: 9.1 mg/dL (ref 8.9–10.3)
Glucose, Bld: 132 mg/dL — ABNORMAL HIGH (ref 65–99)
Potassium: 3.8 mmol/L (ref 3.5–5.1)
Sodium: 140 mmol/L (ref 135–145)
Total Protein: 6.9 g/dL (ref 6.5–8.1)

## 2016-09-19 LAB — CBC WITH DIFFERENTIAL/PLATELET
BASOS ABS: 0 10*3/uL (ref 0.0–0.1)
Basophils Relative: 0 %
EOS PCT: 1 %
Eosinophils Absolute: 0.1 10*3/uL (ref 0.0–0.7)
HEMATOCRIT: 40.9 % (ref 39.0–52.0)
HEMOGLOBIN: 13.5 g/dL (ref 13.0–17.0)
LYMPHS ABS: 0.9 10*3/uL (ref 0.7–4.0)
LYMPHS PCT: 13 %
MCH: 28.3 pg (ref 26.0–34.0)
MCHC: 33 g/dL (ref 30.0–36.0)
MCV: 85.7 fL (ref 78.0–100.0)
Monocytes Absolute: 0.3 10*3/uL (ref 0.1–1.0)
Monocytes Relative: 4 %
NEUTROS ABS: 5.9 10*3/uL (ref 1.7–7.7)
Neutrophils Relative %: 82 %
PLATELETS: 182 10*3/uL (ref 150–400)
RBC: 4.77 MIL/uL (ref 4.22–5.81)
RDW: 13.2 % (ref 11.5–15.5)
WBC: 7.2 10*3/uL (ref 4.0–10.5)

## 2016-09-19 LAB — TROPONIN I: TROPONIN I: 0.25 ng/mL — AB (ref <0.03)

## 2016-09-19 LAB — I-STAT CG4 LACTIC ACID, ED: LACTIC ACID, VENOUS: 1.45 mmol/L (ref 0.5–1.9)

## 2016-09-19 MED ORDER — MONTELUKAST SODIUM 5 MG PO CHEW
5.0000 mg | CHEWABLE_TABLET | Freq: Every day | ORAL | Status: DC
Start: 2016-09-19 — End: 2016-09-26
  Administered 2016-09-19 – 2016-09-25 (×7): 5 mg via ORAL
  Filled 2016-09-19 (×9): qty 1

## 2016-09-19 MED ORDER — IPRATROPIUM BROMIDE 0.02 % IN SOLN
0.5000 mg | Freq: Once | RESPIRATORY_TRACT | Status: AC
  Administered 2016-09-19: 0.5 mg via RESPIRATORY_TRACT
  Filled 2016-09-19: qty 2.5

## 2016-09-19 MED ORDER — FLUTICASONE FUROATE-VILANTEROL 200-25 MCG/INH IN AEPB
1.0000 | INHALATION_SPRAY | Freq: Every day | RESPIRATORY_TRACT | Status: DC
  Administered 2016-09-20 – 2016-09-22 (×3): 1 via RESPIRATORY_TRACT
  Filled 2016-09-19: qty 28

## 2016-09-19 MED ORDER — PANTOPRAZOLE SODIUM 40 MG PO TBEC
40.0000 mg | DELAYED_RELEASE_TABLET | Freq: Every day | ORAL | Status: DC
  Administered 2016-09-20 – 2016-09-22 (×3): 40 mg via ORAL
  Filled 2016-09-19 (×3): qty 1

## 2016-09-19 MED ORDER — CLOPIDOGREL BISULFATE 75 MG PO TABS
75.0000 mg | ORAL_TABLET | Freq: Every day | ORAL | Status: DC
  Administered 2016-09-20 – 2016-09-26 (×7): 75 mg via ORAL
  Filled 2016-09-19 (×7): qty 1

## 2016-09-19 MED ORDER — PREDNISONE 20 MG PO TABS: 40.0000 mg | ORAL_TABLET | Freq: Every day | ORAL | Status: DC

## 2016-09-19 MED ORDER — NITROGLYCERIN 0.4 MG SL SUBL: 0.4000 mg | SUBLINGUAL_TABLET | SUBLINGUAL | Status: DC | PRN

## 2016-09-19 MED ORDER — ROSUVASTATIN CALCIUM 20 MG PO TABS
20.0000 mg | ORAL_TABLET | Freq: Every day | ORAL | Status: DC
  Administered 2016-09-20 – 2016-09-25 (×6): 20 mg via ORAL
  Filled 2016-09-19 (×8): qty 1

## 2016-09-19 MED ORDER — ASPIRIN EC 81 MG PO TBEC
81.0000 mg | DELAYED_RELEASE_TABLET | Freq: Every day | ORAL | Status: DC
  Administered 2016-09-20 – 2016-09-26 (×7): 81 mg via ORAL
  Filled 2016-09-19 (×7): qty 1

## 2016-09-19 MED ORDER — NICOTINE 21 MG/24HR TD PT24
21.0000 mg | MEDICATED_PATCH | Freq: Every day | TRANSDERMAL | Status: DC
  Administered 2016-09-20 – 2016-09-26 (×7): 21 mg via TRANSDERMAL
  Filled 2016-09-19 (×7): qty 1

## 2016-09-19 MED ORDER — ENOXAPARIN SODIUM 40 MG/0.4ML ~~LOC~~ SOLN
40.0000 mg | Freq: Every day | SUBCUTANEOUS | Status: DC
  Administered 2016-09-20 – 2016-09-25 (×7): 40 mg via SUBCUTANEOUS
  Filled 2016-09-19 (×8): qty 0.4

## 2016-09-19 MED ORDER — PRAMIPEXOLE DIHYDROCHLORIDE 0.125 MG PO TABS
0.1250 mg | ORAL_TABLET | Freq: Every day | ORAL | Status: DC
  Administered 2016-09-19 – 2016-09-25 (×7): 0.125 mg via ORAL
  Filled 2016-09-19 (×10): qty 1

## 2016-09-19 MED ORDER — ASPIRIN 81 MG PO CHEW
324.0000 mg | CHEWABLE_TABLET | Freq: Once | ORAL | Status: DC
  Filled 2016-09-19: qty 4

## 2016-09-19 MED ORDER — ASPIRIN 81 MG PO CHEW
81.0000 mg | CHEWABLE_TABLET | Freq: Once | ORAL | Status: AC
  Administered 2016-09-19: 81 mg via ORAL

## 2016-09-19 MED ORDER — ALBUTEROL SULFATE (2.5 MG/3ML) 0.083% IN NEBU
2.5000 mg | INHALATION_SOLUTION | RESPIRATORY_TRACT | Status: DC | PRN
  Administered 2016-09-20 – 2016-09-22 (×5): 2.5 mg via RESPIRATORY_TRACT
  Filled 2016-09-19 (×6): qty 3

## 2016-09-19 MED ORDER — TIOTROPIUM BROMIDE MONOHYDRATE 18 MCG IN CAPS
18.0000 ug | ORAL_CAPSULE | Freq: Every day | RESPIRATORY_TRACT | Status: DC
  Administered 2016-09-20 – 2016-09-21 (×2): 18 ug via RESPIRATORY_TRACT
  Filled 2016-09-19: qty 5

## 2016-09-19 MED ORDER — TRIAMCINOLONE ACETONIDE 55 MCG/ACT NA AERO
2.0000 | INHALATION_SPRAY | Freq: Every day | NASAL | Status: DC
  Administered 2016-09-20 – 2016-09-25 (×6): 2 via NASAL
  Filled 2016-09-19 (×2): qty 21.6

## 2016-09-19 MED ORDER — ASPIRIN 81 MG PO CHEW
243.0000 mg | CHEWABLE_TABLET | Freq: Once | ORAL | Status: AC
  Administered 2016-09-19: 243 mg via ORAL

## 2016-09-19 MED ORDER — ALBUTEROL (5 MG/ML) CONTINUOUS INHALATION SOLN
20.0000 mg/h | INHALATION_SOLUTION | Freq: Once | RESPIRATORY_TRACT | Status: AC
  Administered 2016-09-19: 20 mg/h via RESPIRATORY_TRACT
  Filled 2016-09-19: qty 20

## 2016-09-19 NOTE — ED Notes (Signed)
Pt reports taking ASA 81 mg this morning.  Also, took NTG x 2 for chest pain that was relieved after second NTG.

## 2016-09-19 NOTE — ED Notes (Signed)
Lab called with Troponin results:  0.25. Paged Dr. Julian Reil.

## 2016-09-19 NOTE — Progress Notes (Signed)
RT went to speak to pt about bipap.  Pt states he does not want to try bipap at this time.  He would like to finish his CAT and see if he is still in need of bipap.  Pt also stated he had been placed on bipap at a previous visit and was not able to tolerate well.  Rt will continue to monitor and speak with MD about pts requests.

## 2016-09-19 NOTE — H&P (Signed)
History and Physical    John Marsh:[id number] DOB: 04-30-1946 DOA: 09/19/2016  PCP: Eartha Inch, MD  Patient coming from: Home  I have personally briefly reviewed patient's old medical records in [facility]  Chief Complaint: Respiratory distress  HPI: John Marsh is a 71 y.o. male with medical history significant of very severe COPD with emphysema.  Still smokes about 1PPD which is actually down from his historical 3PPD.  Patient's symptoms onset 3 days ago, worsening, associated chest pain this AM that resolved after 2 NTG.  Given duonebs, mag, solumedrol, breath CAT with some improvement.   ED Course: Some improvement in ED, still with accessory muscle use.  Trop 0.28.   Review of Systems: As per HPI otherwise 10 point review of systems negative.   Past Medical History:  Diagnosis Date  . COPD (chronic obstructive pulmonary disease) (HCC)   . Coronary heart disease   . Emphysema of lung (HCC)   . Hyperlipidemia   . Hypertension   . MI (myocardial infarction)   . Multiple lung nodules on CT   . Nocturnal hypoxia   . Peripheral arterial disease (HCC)   . Shortness of breath dyspnea     Past Surgical History:  Procedure Laterality Date  . CHOLECYSTECTOMY    . NASAL SEPTUM SURGERY    . PERIPHERAL VASCULAR CATHETERIZATION N/A 07/27/2015   Procedure: Abdominal Aortogram;  Surgeon: Chuck Hint, MD;  Location: [facility] INVASIVE CV LAB;  Service: Cardiovascular;  Laterality: N/A;  . PERIPHERAL VASCULAR CATHETERIZATION Bilateral 07/27/2015   Procedure: Lower Extremity Angiography;  Surgeon: Chuck Hint, MD;  Location: [facility] INVASIVE CV LAB;  Service: Cardiovascular;  Laterality: Bilateral;  . stent placed       reports that he has been smoking Cigarettes.  He started smoking about 57 years ago. He has a 27.50 pack-year smoking history. He has never used smokeless tobacco. He reports that he does not drink alcohol or use drugs.  No Known  Allergies  Family History  Problem Relation Age of Onset  . Heart disease Mother     died at 37  . Heart disease Father   . Heart disease Brother   . Heart disease Sister   . Lung disease Neg Hx      Prior to Admission medications   Medication Sig Start Date End Date Taking? Authorizing Provider  albuterol (PROVENTIL HFA;VENTOLIN HFA) 108 (90 BASE) MCG/ACT inhaler Inhale 2 puffs into the lungs every 6 (six) hours as needed for wheezing or shortness of breath. Normally takes 2 puffs every morning and takes daily as needed for rescue thereafter    Historical Provider, MD  aspirin EC 81 MG tablet Take 81 mg by mouth daily.    Historical Provider, MD  budesonide-formoterol (SYMBICORT) 160-4.5 MCG/ACT inhaler Inhale 2 puffs into the lungs 2 (two) times daily.    Historical Provider, MD  clopidogrel (PLAVIX) 75 MG tablet Take 75 mg by mouth daily.    Historical Provider, MD  ibuprofen (ADVIL,MOTRIN) 600 MG tablet Take 1 tablet (600 mg total) by mouth every 6 (six) hours as needed. 07/01/16   Erick Blinks, MD  montelukast (SINGULAIR) 5 MG chewable tablet CHEW 1 TABLET BY MOUTH AT BEDTIME 02/01/16   Roslynn Amble, MD  nitroGLYCERIN (NITROSTAT) 0.4 MG SL tablet Place 0.4 mg under the tongue every 5 (five) minutes as needed for chest pain. x3 doses as needed for chest pain    Historical Provider, MD  pantoprazole (PROTONIX) 40  MG tablet Take 1 tablet (40 mg total) by mouth daily. 07/01/16   Erick Blinks, MD  pramipexole (MIRAPEX) 0.125 MG tablet Take 1 tablet by mouth at bedtime. 06/21/16   Historical Provider, MD  rosuvastatin (CRESTOR) 20 MG tablet Take 20 mg by mouth at bedtime.    Historical Provider, MD  Spacer/Aero-Holding Chambers (AEROCHAMBER Z-STAT PLUS) inhaler Use as instructed 03/04/15   Roslynn Amble, MD  tiotropium (SPIRIVA) 18 MCG inhalation capsule Place 18 mcg into inhaler and inhale daily.    Historical Provider, MD  triamcinolone (NASACORT ALLERGY 24HR) 55 MCG/ACT AERO nasal  inhaler Place 2 sprays into the nose daily.    Historical Provider, MD  UNKNOWN TO PATIENT Apply 1 application topically daily. Applied to nose daily-Prescribed-Name is unknown    Historical Provider, MD    Physical Exam: Vitals:   09/19/16 1903 09/19/16 1910 09/19/16 1939 09/19/16 2000  BP:    138/62  Pulse:   96 99  Resp:   (!) 28 (!) 21  Temp:      TempSrc:      SpO2:  96% 100% 100%  Weight: 74.4 kg (164 lb)     Height: 6' (1.829 m)       Constitutional: NAD, calm, comfortable Eyes: PERRL, lids and conjunctivae normal ENMT: Mucous membranes are moist. Posterior pharynx clear of any exudate or lesions.Normal dentition.  Neck: normal, supple, no masses, no thyromegaly Respiratory: diffuse wheezing, some accessory muscle use noted Cardiovascular: Regular rate and rhythm, no murmurs / rubs / gallops. No extremity edema. 2+ pedal pulses. No carotid bruits.  Abdomen: no tenderness, no masses palpated. No hepatosplenomegaly. Bowel sounds positive.  Musculoskeletal: no clubbing / cyanosis. No joint deformity upper and lower extremities. Good ROM, no contractures. Normal muscle tone.  Skin: no rashes, lesions, ulcers. No induration Neurologic: CN 2-12 grossly intact. Sensation intact, DTR normal. Strength 5/5 in all 4.  Psychiatric: Normal judgment and insight. Alert and oriented x 3. Normal mood.    Labs on Admission: I have personally reviewed following labs and imaging studies  CBC:  Recent Labs Lab 09/19/16 1915  WBC 7.2  NEUTROABS 5.9  HGB 13.5  HCT 40.9  MCV 85.7  PLT 182   Basic Metabolic Panel:  Recent Labs Lab 09/19/16 1915  NA 140  K 3.8  CL 102  CO2 26  GLUCOSE 132*  BUN 10  CREATININE 0.95  CALCIUM 9.1   GFR: Estimated Creatinine Clearance: 74 mL/min (by C-G formula based on SCr of 0.95 mg/dL). Liver Function Tests:  Recent Labs Lab 09/19/16 1915  AST 31  ALT 24  ALKPHOS 75  BILITOT 0.6  PROT 6.9  ALBUMIN 4.0   No results for input(s):  LIPASE, AMYLASE in the last 168 hours. No results for input(s): AMMONIA in the last 168 hours. Coagulation Profile: No results for input(s): INR, PROTIME in the last 168 hours. Cardiac Enzymes: No results for input(s): CKTOTAL, CKMB, CKMBINDEX, TROPONINI in the last 168 hours. BNP (last 3 results) No results for input(s): PROBNP in the last 8760 hours. HbA1C: No results for input(s): HGBA1C in the last 72 hours. CBG: No results for input(s): GLUCAP in the last 168 hours. Lipid Profile: No results for input(s): CHOL, HDL, LDLCALC, TRIG, CHOLHDL, LDLDIRECT in the last 72 hours. Thyroid Function Tests: No results for input(s): TSH, T4TOTAL, FREET4, T3FREE, THYROIDAB in the last 72 hours. Anemia Panel: No results for input(s): VITAMINB12, FOLATE, FERRITIN, TIBC, IRON, RETICCTPCT in the last 72 hours. Urine analysis:  Component Value Date/Time   COLORURINE YELLOW 08/08/2012 1910   APPEARANCEUR CLEAR 08/08/2012 1910   LABSPEC 1.005 08/08/2012 1910   PHURINE 7.0 08/08/2012 1910   GLUCOSEU NEGATIVE 08/08/2012 1910   HGBUR NEGATIVE 08/08/2012 1910   BILIRUBINUR NEGATIVE 08/08/2012 1910   KETONESUR NEGATIVE 08/08/2012 1910   PROTEINUR NEGATIVE 08/08/2012 1910   UROBILINOGEN 0.2 08/08/2012 1910   NITRITE NEGATIVE 08/08/2012 1910   LEUKOCYTESUR TRACE (A) 08/08/2012 1910    Radiological Exams on Admission: Dg Chest Portable 1 View  Result Date: 09/19/2016 CLINICAL DATA:  Extreme dyspnea x3 days with dry cough EXAM: PORTABLE CHEST 1 VIEW COMPARISON:  None. FINDINGS: Emphysematous hyperinflation of the lungs without pneumonic consolidation, effusion or pneumothorax. Calcified granulomata are seen in the upper lobes. Heart is normal in size. There is aortic atherosclerosis involving the arch. No overt pulmonary edema or pneumothorax. EKG leads and oxygen delivery device project over the thorax IMPRESSION: Emphysematous hyperinflation of the lungs similar to prior exam without acute pneumonic  consolidation, pneumothorax or CHF. Calcified granulomata noted bilaterally. Electronically Signed   By: Tollie Eth M.D.   On: 09/19/2016 19:55    EKG: Independently reviewed.  Assessment/Plan Principal Problem:   COPD exacerbation (HCC) Active Problems:   CAD (coronary artery disease), native coronary artery   COPD (chronic obstructive pulmonary disease) with emphysema (HCC)   Elevated troponin   Acute respiratory failure with hypoxia (HCC)    1. COPD exacerbation - acute resp failure with hypoxia 1. COPD protocol 2. PRN albuterol 3. Prednisone daily 4. Had long discussion trying to convince patient to quit smoking 2. CAD with elevated troponin - 1. Likely demand ischemia in setting of COPD exacerbation 2. Got ASA in ED 3. Continue ASA and plavix and statin 4. Serial trops ordered 5. Tele monitor  DVT prophylaxis: Lovenox Code Status: Full Family Communication: Daughter at bedside Disposition Plan: Home after admit Consults called: None Admission status: Admit to inpatient   Hillary Bow DO [facility] Pager [phone]  If 7AM-7PM, please contact day team taking care of patient [email or web address] Password [facility]  09/19/2016, 9:38 PM

## 2016-09-19 NOTE — ED Triage Notes (Signed)
Pt to ED via [facility] with c/o resp distress x's 3 days.  Pt was given Duo Nebs x's 2,  Magnesium 2g, and Solu Medrol .

## 2016-09-19 NOTE — ED Notes (Signed)
Report attempted 

## 2016-09-20 LAB — TROPONIN I
TROPONIN I: 0.1 ng/mL — AB (ref <0.03)
Troponin I: 0.11 ng/mL (ref <0.03)

## 2016-09-20 MED ORDER — ADULT MULTIVITAMIN W/MINERALS CH
1.0000 | ORAL_TABLET | Freq: Every day | ORAL | Status: DC
  Administered 2016-09-20 – 2016-09-26 (×7): 1 via ORAL
  Filled 2016-09-20 (×7): qty 1

## 2016-09-20 MED ORDER — METHYLPREDNISOLONE SODIUM SUCC 125 MG IJ SOLR
60.0000 mg | Freq: Two times a day (BID) | INTRAMUSCULAR | Status: DC
  Administered 2016-09-20 – 2016-09-21 (×4): 60 mg via INTRAVENOUS
  Filled 2016-09-20 (×4): qty 2

## 2016-09-20 MED ORDER — ENSURE ENLIVE PO LIQD
237.0000 mL | Freq: Three times a day (TID) | ORAL | Status: DC
  Administered 2016-09-20 – 2016-09-26 (×17): 237 mL via ORAL

## 2016-09-20 NOTE — Progress Notes (Signed)
Pt is alert and oriented sob at rest with no pain, gave IV Steroids.

## 2016-09-20 NOTE — Progress Notes (Signed)
PROGRESS NOTE    [name]  QIH:[id number] DOB: [birth year] DOA: 09/19/2016 PCP: Eartha Inch, MD  Brief Narrative:John Marsh is a 71 y.o. male with medical history significant for severe COPD with emphysema.  Still smokes about 1PPD which is actually down from his historical 3PPD.   patient presents with cough congestion shortness of breath and wheezing for 3 days he also had some chest pain 2 days ago which resolved after nitroglycerin  Assessment & Plan:   Principal Problem:   COPD exacerbation (HCC) -Chest x-ray without acute findings -IV Solu-Medrol Q12, DuoNeb's,  -Wean off O2 -Ambulate, out of bed to chair -Smoking cessation counseled  Mildly elevated troponin -Due to demand ischemia, non-ACS pattern -no acute changes on EKG, no chest pain now -Check 2-D echocardiogram for wall motion abnormality    CAD (coronary artery disease), native coronary artery -History of PCI, stent -Continue aspirin, Plavix, statin -Not on a beta blocker likely due to severe COPD  Tobacco abuse -Has cut down from 3 packs per day to one pack per day now -Counseled  DVT prophylaxis: Lovenox Code Status: Full code Family Communication: No family at bedside Disposition Plan: Home in 1-2 days if echocardiogram unremarkable and respiratory status continues to improve    Subjective: Breathing improving not back to baseline yet  Objective: Vitals:   09/19/16 2318 09/20/16 0444 09/20/16 0828 09/20/16 1008  BP:  137/65  (!) 142/69  Pulse:  88    Resp:  (!) 24  18  Temp:  97.9 F (36.6 C)  97.8 F (36.6 C)  TempSrc:  Oral  Oral  SpO2:  96% 94% 94%  Weight: 70.1 kg (154 lb 8 oz) 69.4 kg (153 lb)    Height: 6' (1.829 m)       Intake/Output Summary (Last 24 hours) at 09/20/16 1159 Last data filed at 09/20/16 0700  Gross per 24 hour  Intake              240 ml  Output             1175 ml  Net             -935 ml   Filed Weights   09/19/16 1903 09/19/16 2318 09/20/16 0444    Weight: 74.4 kg (164 lb) 70.1 kg (154 lb 8 oz) 69.4 kg (153 lb)    Examination:  General exam: Appears calm and comfortable  Respiratory system: poor air movement, few wheezes Cardiovascular system: S1 & S2 heard, RRR. No JVD, murmurs, rubs Gastrointestinal system: Abdomen is nondistended, soft and nontender. Normal bowel sounds heard. Central nervous system: Alert and oriented. No focal neurological deficits. Extremities: Symmetric 5 x 5 power. Skin: No rashes, lesions or ulcers Psychiatry: Judgement and insight appear normal. Mood & affect appropriate.     Data Reviewed:   CBC:  Recent Labs Lab 09/19/16 1915  WBC 7.2  NEUTROABS 5.9  HGB 13.5  HCT 40.9  MCV 85.7  PLT 182   Basic Metabolic Panel:  Recent Labs Lab 09/19/16 1915  NA 140  K 3.8  CL 102  CO2 26  GLUCOSE 132*  BUN 10  CREATININE 0.95  CALCIUM 9.1   GFR: Estimated Creatinine Clearance: 69 mL/min (by C-G formula based on SCr of 0.95 mg/dL). Liver Function Tests:  Recent Labs Lab 09/19/16 1915  AST 31  ALT 24  ALKPHOS 75  BILITOT 0.6  PROT 6.9  ALBUMIN 4.0   No results for input(s): LIPASE, AMYLASE in  the last 168 hours. No results for input(s): AMMONIA in the last 168 hours. Coagulation Profile: No results for input(s): INR, PROTIME in the last 168 hours. Cardiac Enzymes:  Recent Labs Lab 09/19/16 2118 09/20/16 0449 09/20/16 0834  TROPONINI 0.25* 0.10* 0.11*   BNP (last 3 results) No results for input(s): PROBNP in the last 8760 hours. HbA1C: No results for input(s): HGBA1C in the last 72 hours. CBG: No results for input(s): GLUCAP in the last 168 hours. Lipid Profile: No results for input(s): CHOL, HDL, LDLCALC, TRIG, CHOLHDL, LDLDIRECT in the last 72 hours. Thyroid Function Tests: No results for input(s): TSH, T4TOTAL, FREET4, T3FREE, THYROIDAB in the last 72 hours. Anemia Panel: No results for input(s): VITAMINB12, FOLATE, FERRITIN, TIBC, IRON, RETICCTPCT in the last 72  hours. Urine analysis:    Component Value Date/Time   COLORURINE YELLOW 08/08/2012 1910   APPEARANCEUR CLEAR 08/08/2012 1910   LABSPEC 1.005 08/08/2012 1910   PHURINE 7.0 08/08/2012 1910   GLUCOSEU NEGATIVE 08/08/2012 1910   HGBUR NEGATIVE 08/08/2012 1910   BILIRUBINUR NEGATIVE 08/08/2012 1910   KETONESUR NEGATIVE 08/08/2012 1910   PROTEINUR NEGATIVE 08/08/2012 1910   UROBILINOGEN 0.2 08/08/2012 1910   NITRITE NEGATIVE 08/08/2012 1910   LEUKOCYTESUR TRACE (A) 08/08/2012 1910   Sepsis Labs: (procalcitonin:4,lacticidven:4)  )No results found for this or any previous visit (from the past 240 hour(s)).       Radiology Studies: Dg Chest Portable 1 View  Result Date: 09/19/2016 CLINICAL DATA:  Extreme dyspnea x3 days with dry cough EXAM: PORTABLE CHEST 1 VIEW COMPARISON:  None. FINDINGS: Emphysematous hyperinflation of the lungs without pneumonic consolidation, effusion or pneumothorax. Calcified granulomata are seen in the upper lobes. Heart is normal in size. There is aortic atherosclerosis involving the arch. No overt pulmonary edema or pneumothorax. EKG leads and oxygen delivery device project over the thorax IMPRESSION: Emphysematous hyperinflation of the lungs similar to prior exam without acute pneumonic consolidation, pneumothorax or CHF. Calcified granulomata noted bilaterally. Electronically Signed   By: Tollie Eth M.D.   On: 09/19/2016 19:55        Scheduled Meds: . aspirin EC  81 mg Oral Daily  . clopidogrel  75 mg Oral Daily  . enoxaparin (LOVENOX) injection  40 mg Subcutaneous QHS  . fluticasone furoate-vilanterol  1 puff Inhalation Daily  . methylPREDNISolone (SOLU-MEDROL) injection  60 mg Intravenous Q12H  . montelukast  5 mg Oral QHS  . nicotine  21 mg Transdermal Daily  . pantoprazole  40 mg Oral Daily  . pramipexole  0.125 mg Oral QHS  . rosuvastatin  20 mg Oral QHS  . tiotropium  18 mcg Inhalation Daily  . triamcinolone  2 spray Nasal Daily    Continuous Infusions:   LOS: 1 day    Time spent:    Zannie Cove, MD [facility] Pager [phone]  If 7PM-7AM, please contact night-coverage [email or web address] Password [email or web address] 09/20/2016, 11:59 AM

## 2016-09-20 NOTE — Progress Notes (Signed)
Patient refused bed alarm. Will continue to monitor patient. 

## 2016-09-20 NOTE — Progress Notes (Signed)
PRN Neb given

## 2016-09-20 NOTE — Progress Notes (Signed)
Pt has been anxious most of the day, poor appetite supplements ordered is feeling tight in the chest and sob.

## 2016-09-20 NOTE — Progress Notes (Signed)
Pt is alert and oriented with Wife at the bedside Breathing had improved on RA until Wife come in the room patient start coughing and Sob at rest. Applied O2 at 2L.  Sats in mid 90s

## 2016-09-20 NOTE — ED Provider Notes (Signed)
[facility]-EMERGENCY DEPT Provider Note   CSN: [id number] Arrival date & time: 09/19/16  1850     History   Chief Complaint Chief Complaint  Patient presents with  . Respiratory Distress    HPI John Marsh is a 71 y.o. male.  HPI   71 year old male with a history of COPD requiring prior admissions, coronary artery disease presents with concern for 3 days of worsening shortness of breath, wheezing, coughing, and chest pain.  Patient reports that chest pain was early this morning and resolved after he took 2 nitroglycerin. Denies current chest pain. Reports his shortness of breath was very severe prior to EMS arrival, and now after receiving magnesium, Solu-Medrol, albuterol and do nebs with EMS he reports some improvement with shortness of breath a 7 out of 10. Reports he has required BiPAP before for COPD, however has never been intubated. Denies fevers. Reports that in the past she's had a difficult time telling the difference between his symptoms of COPD and symptoms of MI.   Past Medical History:  Diagnosis Date  . COPD (chronic obstructive pulmonary disease) (HCC)   . Coronary heart disease   . Emphysema of lung (HCC)   . Hyperlipidemia   . Hypertension   . MI (myocardial infarction)   . Multiple lung nodules on CT   . Nocturnal hypoxia   . Peripheral arterial disease (HCC)   . Shortness of breath dyspnea     Patient Active Problem List   Diagnosis Date Noted  . Elevated troponin 09/19/2016  . Acute respiratory failure with hypoxia (HCC) 09/19/2016  . Chest pain 06/30/2016  . PVD (peripheral vascular disease) with claudication (HCC) 07/27/2015  . COPD exacerbation (HCC) 07/03/2015  . Tachycardia 07/03/2015  . Chronic respiratory failure with hypoxia (HCC) 07/03/2015  . Dyspnea 06/03/2015  . Allergic rhinitis 06/03/2015  . Emphysema lung (HCC) 03/04/2015  . Nocturnal hypoxia 03/04/2015  . Pulmonary nodules 08/10/2014  . HYPERTRIGLYCERIDEMIA 08/16/2006  . HLD  (hyperlipidemia) 08/16/2006  . TOBACCO DEPENDENCE 08/16/2006  . HYPERTENSION, BENIGN SYSTEMIC 08/16/2006  . CAD (coronary artery disease), native coronary artery 08/16/2006  . COPD (chronic obstructive pulmonary disease) with emphysema (HCC) 08/16/2006    Past Surgical History:  Procedure Laterality Date  . CHOLECYSTECTOMY    . NASAL SEPTUM SURGERY    . PERIPHERAL VASCULAR CATHETERIZATION N/A 07/27/2015   Procedure: Abdominal Aortogram;  Surgeon: Chuck Hint, MD;  Location: [facility] INVASIVE CV LAB;  Service: Cardiovascular;  Laterality: N/A;  . PERIPHERAL VASCULAR CATHETERIZATION Bilateral 07/27/2015   Procedure: Lower Extremity Angiography;  Surgeon: Chuck Hint, MD;  Location: [facility] INVASIVE CV LAB;  Service: Cardiovascular;  Laterality: Bilateral;  . stent placed         Home Medications    Prior to Admission medications   Medication Sig Start Date End Date Taking? Authorizing Provider  albuterol (PROVENTIL HFA;VENTOLIN HFA) 108 (90 BASE) MCG/ACT inhaler Inhale 2 puffs into the lungs every 6 (six) hours as needed for wheezing or shortness of breath. Normally takes 2 puffs every morning and takes daily as needed for rescue thereafter   Yes Historical Provider, MD  aspirin EC 81 MG tablet Take 81 mg by mouth daily.   Yes Historical Provider, MD  budesonide-formoterol (SYMBICORT) 160-4.5 MCG/ACT inhaler Inhale 2 puffs into the lungs 2 (two) times daily.   Yes Historical Provider, MD  clopidogrel (PLAVIX) 75 MG tablet Take 75 mg by mouth daily.   Yes Historical Provider, MD  ibuprofen (ADVIL,MOTRIN) 600 MG tablet Take  1 tablet (600 mg total) by mouth every 6 (six) hours as needed. Patient taking differently: Take 600 mg by mouth every 6 (six) hours as needed for moderate pain.  07/01/16  Yes Erick Blinks, MD  montelukast (SINGULAIR) 5 MG chewable tablet CHEW 1 TABLET BY MOUTH AT BEDTIME 02/01/16  Yes Roslynn Amble, MD  nitroGLYCERIN (NITROSTAT) 0.4 MG SL tablet Place 0.4 mg  under the tongue every 5 (five) minutes as needed for chest pain. x3 doses as needed for chest pain   Yes Historical Provider, MD  pantoprazole (PROTONIX) 40 MG tablet Take 1 tablet (40 mg total) by mouth daily. 07/01/16  Yes Erick Blinks, MD  pramipexole (MIRAPEX) 0.125 MG tablet Take 1 tablet by mouth at bedtime. 06/21/16  Yes Historical Provider, MD  rosuvastatin (CRESTOR) 20 MG tablet Take 20 mg by mouth at bedtime.   Yes Historical Provider, MD  Spacer/Aero-Holding Chambers (AEROCHAMBER Z-STAT PLUS) inhaler Use as instructed 03/04/15  Yes Roslynn Amble, MD  tiotropium (SPIRIVA) 18 MCG inhalation capsule Place 18 mcg into inhaler and inhale daily.   Yes Historical Provider, MD  triamcinolone (NASACORT ALLERGY 24HR) 55 MCG/ACT AERO nasal inhaler Place 2 sprays into the nose daily.   Yes Historical Provider, MD  UNKNOWN TO PATIENT Apply 1 application topically at bedtime. Applied to nose daily-Prescribed-Name is unknown    Yes Historical Provider, MD    Family History Family History  Problem Relation Age of Onset  . Heart disease Mother     died at 58  . Heart disease Father   . Heart disease Brother   . Heart disease Sister   . Lung disease Neg Hx     Social History Social History  Substance Use Topics  . Smoking status: Current Every Day Smoker    Packs/day: 0.50    Years: 55.00    Types: Cigarettes    Start date: 08/01/1959  . Smokeless tobacco: Never Used     Comment: 1/2 ppd 06/03/15 - peak 3ppd   taking Chantix currently 07-20-2015  . Alcohol use No     Allergies   Patient has no known allergies.   Review of Systems Review of Systems  Constitutional: Negative for fever.  HENT: Negative for sore throat.   Eyes: Negative for visual disturbance.  Respiratory: Positive for cough, shortness of breath and wheezing.   Cardiovascular: Positive for chest pain. Negative for leg swelling.  Gastrointestinal: Negative for abdominal pain, nausea and vomiting.  Genitourinary:  Negative for difficulty urinating.  Musculoskeletal: Negative for back pain and neck stiffness.  Skin: Negative for rash.  Neurological: Negative for syncope and headaches.     Physical Exam Updated Vital Signs BP 117/68 (BP Location: Left Arm)   Pulse 99   Temp 97.6 F (36.4 C) (Oral)   Resp 20   Ht 6' (1.829 m)   Wt 154 lb 8 oz (70.1 kg) Comment: scale a  SpO2 96%   BMI 20.95 kg/m   Physical Exam  Constitutional: He is oriented to person, place, and time. He appears well-developed and well-nourished. He appears distressed.  HENT:  Head: Normocephalic and atraumatic.  Eyes: Conjunctivae and EOM are normal.  Neck: Normal range of motion.  Cardiovascular: Normal rate, regular rhythm, normal heart sounds and intact distal pulses.  Exam reveals no gallop and no friction rub.   No murmur heard. Pulmonary/Chest: Accessory muscle usage present. Tachypnea noted. He is in respiratory distress. He has decreased breath sounds. He has wheezes (occasional). He has  no rales.  Abdominal: Soft. He exhibits no distension. There is no tenderness. There is no guarding.  Musculoskeletal: He exhibits no edema.  Neurological: He is alert and oriented to person, place, and time.  Skin: Skin is warm and dry. He is not diaphoretic.  Nursing note and vitals reviewed.    ED Treatments / Results  Labs (all labs ordered are listed, but only abnormal results are displayed) Labs Reviewed  COMPREHENSIVE METABOLIC PANEL - Abnormal; Notable for the following:       Result Value   Glucose, Bld 132 (*)    All other components within normal limits  TROPONIN I - Abnormal; Notable for the following:    Troponin I 0.25 (*)    All other components within normal limits  I-STAT TROPOININ, ED - Abnormal; Notable for the following:    Troponin i, poc 0.27 (*)    All other components within normal limits  CBC WITH DIFFERENTIAL/PLATELET  TROPONIN I  TROPONIN I  I-STAT CG4 LACTIC ACID, ED  I-STAT CG4 LACTIC  ACID, ED    EKG  EKG Interpretation  Date/Time:  Tuesday September 19 2016 20:13:23 EDT Ventricular Rate:  98 PR Interval:    QRS Duration: 87 QT Interval:  366 QTC Calculation: 468 R Axis:   69 Text Interpretation:  Sinus rhythm Anteroseptal infarct, age indeterminate No significant change since last tracing Confirmed by [facility] MD, Keaun Schnabel ([id number]) on 09/19/2016 8:19:16 PM       Radiology Dg Chest Portable 1 View  Result Date: 09/19/2016 CLINICAL DATA:  Extreme dyspnea x3 days with dry cough EXAM: PORTABLE CHEST 1 VIEW COMPARISON:  None. FINDINGS: Emphysematous hyperinflation of the lungs without pneumonic consolidation, effusion or pneumothorax. Calcified granulomata are seen in the upper lobes. Heart is normal in size. There is aortic atherosclerosis involving the arch. No overt pulmonary edema or pneumothorax. EKG leads and oxygen delivery device project over the thorax IMPRESSION: Emphysematous hyperinflation of the lungs similar to prior exam without acute pneumonic consolidation, pneumothorax or CHF. Calcified granulomata noted bilaterally. Electronically Signed   By: Tollie Eth M.D.   On: 09/19/2016 19:55    Procedures Procedures (including critical care time)  Medications Ordered in ED Medications  predniSONE (DELTASONE) tablet 40 mg (not administered)  albuterol (PROVENTIL) (2.5 MG/3ML) 0.083% nebulizer solution 2.5 mg (not administered)  aspirin EC tablet 81 mg (not administered)  clopidogrel (PLAVIX) tablet 75 mg (not administered)  fluticasone furoate-vilanterol (BREO ELLIPTA) 200-25 MCG/INH 1 puff (not administered)  rosuvastatin (CRESTOR) tablet 20 mg (not administered)  pramipexole (MIRAPEX) tablet 0.125 mg (0.125 mg Oral Given 09/19/16 2330)  pantoprazole (PROTONIX) EC tablet 40 mg (not administered)  nitroGLYCERIN (NITROSTAT) SL tablet 0.4 mg (not administered)  montelukast (SINGULAIR) chewable tablet 5 mg (5 mg Oral Given 09/19/16 2330)  tiotropium (SPIRIVA) inhalation  capsule 18 mcg (not administered)  triamcinolone (NASACORT) nasal inhaler 2 spray (not administered)  enoxaparin (LOVENOX) injection 40 mg (not administered)  nicotine (NICODERM CQ - dosed in mg/24 hours) patch 21 mg (not administered)  albuterol (PROVENTIL,VENTOLIN) solution continuous neb (20 mg/hr Nebulization Given 09/19/16 1906)  ipratropium (ATROVENT) nebulizer solution 0.5 mg (0.5 mg Nebulization Given 09/19/16 1906)  aspirin chewable tablet 243 mg (243 mg Oral Given 09/19/16 2034)  aspirin chewable tablet 81 mg (81 mg Oral Given 09/19/16 2038)   CRITICAL CARE: COPD exacerbation, continuous neb Performed by: Rhae Lerner   Total critical care time: 30 minutes  Critical care time was exclusive of separately billable procedures and treating  other patients.  Critical care was necessary to treat or prevent imminent or life-threatening deterioration.  Critical care was time spent personally by me on the following activities: development of treatment plan with patient and/or surrogate as well as nursing, discussions with consultants, evaluation of patient's response to treatment, examination of patient, obtaining history from patient or surrogate, ordering and performing treatments and interventions, ordering and review of laboratory studies, ordering and review of radiographic studies, pulse oximetry and re-evaluation of patient's condition.   Initial Impression / Assessment and Plan / ED Course  I have reviewed the triage vital signs and the nursing notes.  Pertinent labs & imaging results that were available during my care of the patient were reviewed by me and considered in my medical decision making (see chart for details).    70 year old male with a history of COPD, hypertension, hyperlipidemia, coronary artery disease, peripheral artery disease, smoking who presents with concern for shortness of breath, wheezing and chest pain early this AM.  Differential diagnosis for  dyspnea includes ACS, PE, COPD exacerbation, CHF exacerbation, anemia, pneumonia.  Chest x-ray was done which showed no acute findings. EKG was evaluated by me which showed no acute change.  No sign of CHF on exam, hx. Doubt PE given no asymmetric leg swelling, no risk factors other than smoking/age, suspect COPD exacerbation more likely.  Troponin elevated, suspect most likely secondary to strain however need to trend to evaluate for possible ischemia.  Patient received Solu-Medrol, magnesium, and members with EMS. On arrival to the emergency department, is in significant respiratory distress, recommend continue with nebulizer treatments and BiPAP. Patient declined BiPAP, however has had some moderate improvement with albuterol 20 mg/h, and Atrovent. Given aspirin for concern of elevated troponin. Consulted Dr. Julian Reil, hospitalist will admit patient.  Final Clinical Impressions(s) / ED Diagnoses   Final diagnoses:  COPD exacerbation (HCC)  Elevated troponin    New Prescriptions Current Discharge Medication List       Alvira Monday, MD 09/20/16 [phone]

## 2016-09-20 NOTE — Progress Notes (Signed)
Initial Nutrition Assessment  DOCUMENTATION CODES:   Severe malnutrition in context of chronic illness  INTERVENTION:   -Ensure Enlive po TID, each supplement provides 350 kcal and 20 grams of protein -MVI daily  NUTRITION DIAGNOSIS:   Malnutrition (severe) related to chronic illness (COPD) as evidenced by moderate depletions of muscle mass, severe depletion of muscle mass, moderate depletion of body fat, severe depletion of body fat, energy intake < or equal to 75% for > or equal to 1 month.  GOAL:   Patient will meet greater than or equal to 90% of their needs  MONITOR:   PO intake, Supplement acceptance, Labs, Weight trends, Skin, I & O's  REASON FOR ASSESSMENT:   Consult COPD Protocol  ASSESSMENT:   John Marsh is a 71 y.o. male with medical history significant of very severe COPD with emphysema.  Still smokes about 1PPD which is actually down from his historical 3PPD.  Patient's symptoms onset 3 days ago, worsening, associated chest pain this AM that resolved after 2 NTG.  Given duonebs, mag, solumedrol, breath CAT with some improvement.  Pt admitted with COPD exacerbation.   Spoke with pt and wife at bedside, who reports ongoing poor appetite and weight loss over the past 2 years. Pt shares that he has no desire to eat and often goes throughout the day without eating. Over the past 2-3 months, intake has gotten much worse despite efforts to try to consume 3 meals per day. Per pt, frequently consumed foods include ice cream, pudding, potted meat, and honey buns. He complains of early satiety. He ate a few bites of eggs for breakfast, but also consumed a pudding and Ensure prior to visit.   Pt reports he used to weigh 220#, which he maintained prior to 2 years ago. Per wt hx, pt has experienced a 6.7% wt loss over the past 3 months, which while not significant is concerning due to multiple chronic illnesses and prolonged poor oral intake.   Nutrition-Focused physical exam  completed. Findings are moderate to severe fat depletion, moderate to severe muscle depletion, and no edema.   Discussed importance of good meal and supplement intake to promote healing, preserve lean body mass, and prevent further weight loss. Recommended transition to small, frequent high protein meals/snacks (4-6 times throughout the day) to increase intake. Also discussed supplement options for home use; pt is amenable to Ensure supplements. Pt and wife also requesting MVI, which RD will order.   Labs reviewed.   Diet Order:  Diet Heart Room service appropriate? Yes; Fluid consistency: Thin  Skin:  Reviewed, no issues  Last BM:  09/19/16  Height:   Ht Readings from Last 1 Encounters:  09/19/16 6' (1.829 m)    Weight:   Wt Readings from Last 1 Encounters:  09/20/16 153 lb (69.4 kg)    Ideal Body Weight:  80.9 kg  BMI:  Body mass index is 20.75 kg/m.  Estimated Nutritional Needs:   Kcal:  2100-2300  Protein:  100-115 grams  Fluid:  2.1-2.3 L  EDUCATION NEEDS:   Education needs addressed  John Marsh A. Mayford Knife, RD, LDN, CDE Pager: [phone] After hours Pager: [phone]

## 2016-09-20 NOTE — Progress Notes (Signed)
Patient arrived to 3E01 from [facility]. Alert and oriented X 4. No complaints of pain. VSS. RA sats 96%. SR on telemetry. No skin breakdown or open wounds. Moderate fall risk. Admmitted with acute respiratory failure with hypoxia. Care plan initiated. Patient oriented to room and call system.

## 2016-09-21 ENCOUNTER — Inpatient Hospital Stay (HOSPITAL_COMMUNITY): Payer: Medicare Other

## 2016-09-21 ENCOUNTER — Encounter (HOSPITAL_COMMUNITY): Payer: Self-pay | Admitting: *Deleted

## 2016-09-21 DIAGNOSIS — E43 Unspecified severe protein-calorie malnutrition: Secondary | ICD-10-CM | POA: Insufficient documentation

## 2016-09-21 LAB — ECHOCARDIOGRAM COMPLETE
AOPV: 0.58 m/s
AV Peak grad: 13 mmHg
AVPKVEL: 177 cm/s
CHL CUP MV DEC (S): 278
E decel time: 278 msec
EERAT: 9.33
FS: 37 % (ref 28–44)
HEIGHTINCHES: 72 in
IVS/LV PW RATIO, ED: 1.09
LA ID, A-P, ES: 30 mm
LA diam end sys: 30 mm
LA vol A4C: 40.9 ml
LADIAMINDEX: 1.58 cm/m2
LV E/e'average: 9.33
LV e' LATERAL: 8.45 cm/s
LVEEMED: 9.33
LVOT VTI: 22.4 cm
LVOT peak vel: 102 cm/s
Lateral S' vel: 12.4 cm/s
MV pk A vel: 85.3 m/s
MV pk E vel: 78.8 m/s
MVPG: 2 mmHg
PW: 9.85 mm — AB (ref 0.6–1.1)
TAPSE: 19.5 mm
TDI e' lateral: 8.45
TDI e' medial: 7.08
WEIGHTICAEL: 2449.6 [oz_av]

## 2016-09-21 MED ORDER — IPRATROPIUM-ALBUTEROL 0.5-2.5 (3) MG/3ML IN SOLN
3.0000 mL | RESPIRATORY_TRACT | Status: DC
  Administered 2016-09-21 – 2016-09-24 (×14): 3 mL via RESPIRATORY_TRACT
  Filled 2016-09-21 (×14): qty 3

## 2016-09-21 MED ORDER — BENZONATATE 100 MG PO CAPS
100.0000 mg | ORAL_CAPSULE | Freq: Three times a day (TID) | ORAL | Status: DC
  Administered 2016-09-21 – 2016-09-26 (×15): 100 mg via ORAL
  Filled 2016-09-21 (×16): qty 1

## 2016-09-21 NOTE — Progress Notes (Signed)
MD notified  

## 2016-09-21 NOTE — Progress Notes (Signed)
Pt is  In ECHO Lab

## 2016-09-21 NOTE — Significant Event (Signed)
Rapid Response Event Note RT and RN at bedside, pt SOB Overview: Time Called: 2018 Arrival Time: 2020 Event Type: Respiratory  Initial Focused Assessment: On arrival pt sitting upright in bed, skin warm and dry, mild WOB noted, lungs clear anteriorly, diminished posterly mid base down, no wheezing, crackles or rhonchi auscultated. Pt reports SOB comes and goes, denies distress, also states "I'm a lot better than I was when I came in here." PTA Christina RN paged Craige Cotta NP, administered Albuterol 2.5 mg, Tessalon 100 mg PO, Solumedrol 60 mg IVP, Singular 5 mg PO.  Interventions: CXR ordered, spoke with Craige Cotta NP, orders placed to switch breathing treatment to duo neb Q 4 hours. Christina RN aware of plan and to call for any concerns.  Plan of Care (if not transferred):  Event Summary: Name of Physician Notified: Craige Cotta NP  at 2025    at    Outcome: Stayed in room and stabalized     [place], [place]on Dunwoody Callery

## 2016-09-21 NOTE — Progress Notes (Signed)
Patient having shortness of breath and diaphoretic.Alert and oriented X 4. Patient sitting at the bed.  Charge nurse and Rapid Response Nurse called for further assistance and management. Charge nurse RT at bedside at this time.

## 2016-09-21 NOTE — Progress Notes (Signed)
Patient having shortness of breath. Albuterol treatment given. Respiratory therapist at bedside.

## 2016-09-21 NOTE — Progress Notes (Signed)
  Echocardiogram 2D Echocardiogram has been performed.  Jaxson Keener T Umair Rosiles 09/21/2016, 11:17 AM

## 2016-09-21 NOTE — Progress Notes (Signed)
PROGRESS NOTE    John Marsh  NWG:[id number] DOB: [birth year] DOA: 09/19/2016 PCP: Eartha Inch, MD  Brief Narrative:John Marsh is a 71 y.o. male with medical history significant for severe COPD with emphysema.  Still smokes about 1PPD which is actually down from his historical 3PPD.   patient presents with cough congestion shortness of breath and wheezing for 3 days he also had some chest pain 2 days ago which resolved after nitroglycerin  Assessment & Plan:   Principal Problem:   COPD exacerbation (HCC) -Chest x-ray without acute findings -improving, but still wheezing, continue IV Solu-Medrol Q12, DuoNeb's,  -Wean off O2, suspect will need Home O2 -Ambulate -Smoking cessation counseled  Mildly elevated troponin -Due to demand ischemia, non-ACS pattern -no acute changes on EKG, no chest pain now -2-D echocardiogram still pending    CAD (coronary artery disease), native coronary artery -History of PCI, stent -Continue aspirin, Plavix, statin -Not on a beta blocker likely due to severe COPD  Tobacco abuse -Has cut down from 3 packs per day to one pack per day now -Counseled  DVT prophylaxis: Lovenox Code Status: Full code Family Communication: d/w wife at bedside Disposition Plan: Home in 1-2 days if echocardiogram unremarkable and respiratory status continues to improve    Subjective: Breathing improving not back to baseline yet, coughing a lot  Objective: Vitals:   09/21/16 0135 09/21/16 0717 09/21/16 0752 09/21/16 1137  BP: (!) 130/59 (!) 154/70  129/72  Pulse: 86 94  85  Resp: 20 20  (!) 24  Temp: 97.8 F (36.6 C) 97.8 F (36.6 C)  97.2 F (36.2 C)  TempSrc: Oral Oral  Oral  SpO2: 99% 98% 99% 99%  Weight:  69.4 kg (153 lb 1.6 oz)    Height:        Intake/Output Summary (Last 24 hours) at 09/21/16 1250 Last data filed at 09/21/16 0956  Gross per 24 hour  Intake             1397 ml  Output             1300 ml  Net               97 ml   Filed  Weights   09/19/16 2318 09/20/16 0444 09/21/16 0717  Weight: 70.1 kg (154 lb 8 oz) 69.4 kg (153 lb) 69.4 kg (153 lb 1.6 oz)    Examination:  General exam: Appears calm and comfortable  Respiratory system: poor air movement, few wheezes esp in upper lobes Cardiovascular system: S1 & S2 heard, RRR. No JVD, murmurs, rubs Gastrointestinal system: Abdomen is nondistended, soft and nontender. Normal bowel sounds heard. Central nervous system: Alert and oriented. No focal neurological deficits. Extremities: Symmetric 5 x 5 power. Skin: No rashes, lesions or ulcers Psychiatry: Judgement and insight appear normal. Mood & affect appropriate.     Data Reviewed:   CBC:  Recent Labs Lab 09/19/16 1915  WBC 7.2  NEUTROABS 5.9  HGB 13.5  HCT 40.9  MCV 85.7  PLT 182   Basic Metabolic Panel:  Recent Labs Lab 09/19/16 1915  NA 140  K 3.8  CL 102  CO2 26  GLUCOSE 132*  BUN 10  CREATININE 0.95  CALCIUM 9.1   GFR: Estimated Creatinine Clearance: 69 mL/min (by C-G formula based on SCr of 0.95 mg/dL). Liver Function Tests:  Recent Labs Lab 09/19/16 1915  AST 31  ALT 24  ALKPHOS 75  BILITOT 0.6  PROT 6.9  ALBUMIN  4.0   No results for input(s): LIPASE, AMYLASE in the last 168 hours. No results for input(s): AMMONIA in the last 168 hours. Coagulation Profile: No results for input(s): INR, PROTIME in the last 168 hours. Cardiac Enzymes:  Recent Labs Lab 09/19/16 2118 09/20/16 0449 09/20/16 0834  TROPONINI 0.25* 0.10* 0.11*   BNP (last 3 results) No results for input(s): PROBNP in the last 8760 hours. HbA1C: No results for input(s): HGBA1C in the last 72 hours. CBG: No results for input(s): GLUCAP in the last 168 hours. Lipid Profile: No results for input(s): CHOL, HDL, LDLCALC, TRIG, CHOLHDL, LDLDIRECT in the last 72 hours. Thyroid Function Tests: No results for input(s): TSH, T4TOTAL, FREET4, T3FREE, THYROIDAB in the last 72 hours. Anemia Panel: No results for  input(s): VITAMINB12, FOLATE, FERRITIN, TIBC, IRON, RETICCTPCT in the last 72 hours. Urine analysis:    Component Value Date/Time   COLORURINE YELLOW 08/08/2012 1910   APPEARANCEUR CLEAR 08/08/2012 1910   LABSPEC 1.005 08/08/2012 1910   PHURINE 7.0 08/08/2012 1910   GLUCOSEU NEGATIVE 08/08/2012 1910   HGBUR NEGATIVE 08/08/2012 1910   BILIRUBINUR NEGATIVE 08/08/2012 1910   KETONESUR NEGATIVE 08/08/2012 1910   PROTEINUR NEGATIVE 08/08/2012 1910   UROBILINOGEN 0.2 08/08/2012 1910   NITRITE NEGATIVE 08/08/2012 1910   LEUKOCYTESUR TRACE (A) 08/08/2012 1910   Sepsis Labs: (procalcitonin:4,lacticidven:4)  )No results found for this or any previous visit (from the past 240 hour(s)).       Radiology Studies: Dg Chest Portable 1 View  Result Date: 09/19/2016 CLINICAL DATA:  Extreme dyspnea x3 days with dry cough EXAM: PORTABLE CHEST 1 VIEW COMPARISON:  None. FINDINGS: Emphysematous hyperinflation of the lungs without pneumonic consolidation, effusion or pneumothorax. Calcified granulomata are seen in the upper lobes. Heart is normal in size. There is aortic atherosclerosis involving the arch. No overt pulmonary edema or pneumothorax. EKG leads and oxygen delivery device project over the thorax IMPRESSION: Emphysematous hyperinflation of the lungs similar to prior exam without acute pneumonic consolidation, pneumothorax or CHF. Calcified granulomata noted bilaterally. Electronically Signed   By: Tollie Eth M.D.   On: 09/19/2016 19:55        Scheduled Meds: . aspirin EC  81 mg Oral Daily  . benzonatate  100 mg Oral TID  . clopidogrel  75 mg Oral Daily  . enoxaparin (LOVENOX) injection  40 mg Subcutaneous QHS  . feeding supplement (ENSURE ENLIVE)  237 mL Oral TID BM  . fluticasone furoate-vilanterol  1 puff Inhalation Daily  . methylPREDNISolone (SOLU-MEDROL) injection  60 mg Intravenous Q12H  . montelukast  5 mg Oral QHS  . multivitamin with minerals  1 tablet Oral Daily    . nicotine  21 mg Transdermal Daily  . pantoprazole  40 mg Oral Daily  . pramipexole  0.125 mg Oral QHS  . rosuvastatin  20 mg Oral QHS  . tiotropium  18 mcg Inhalation Daily  . triamcinolone  2 spray Nasal Daily   Continuous Infusions:   LOS: 2 days    Time spent:    Zannie Cove, MD [facility] Pager [phone]  If 7PM-7AM, please contact night-coverage [email or web address] Password [email or web address] 09/21/2016, 12:50 PM

## 2016-09-21 NOTE — Progress Notes (Signed)
RT called to give PRN treatment for pt SOB and having increased WOB.  RN had already started an Albuterol neb treatment.  Pt has PRN Q1 Albuterol available.  RT suggested notifying Rapid Response RN and seeing about getting scheduled Duoneb treatments.  Pt feeling better during neb treatment.  RT will continue to follow.

## 2016-09-21 NOTE — Progress Notes (Signed)
Pt wife is updated and would like to talk to MD

## 2016-09-21 NOTE — Progress Notes (Signed)
Patient with shortness of breath, labored breathing and diaphoretic. Solumedrol, Albuterol treatment, Singulair and Tessalon given. Patient coughing intermittently. RRT nurse at bedtime at this time. Will continue to monitor frequently.

## 2016-09-21 NOTE — Progress Notes (Signed)
RRT ordered for stat chest X-ray and MD ordered for scheduled Duoneb and Spiriva. Patient states he feel much better but still having SOB. Will continue to monitor frequently.

## 2016-09-22 ENCOUNTER — Encounter (HOSPITAL_COMMUNITY): Payer: Self-pay | Admitting: *Deleted

## 2016-09-22 ENCOUNTER — Inpatient Hospital Stay (HOSPITAL_COMMUNITY): Payer: Medicare Other

## 2016-09-22 DIAGNOSIS — E43 Unspecified severe protein-calorie malnutrition: Secondary | ICD-10-CM

## 2016-09-22 DIAGNOSIS — J309 Allergic rhinitis, unspecified: Secondary | ICD-10-CM

## 2016-09-22 DIAGNOSIS — R0603 Acute respiratory distress: Secondary | ICD-10-CM

## 2016-09-22 DIAGNOSIS — F17200 Nicotine dependence, unspecified, uncomplicated: Secondary | ICD-10-CM

## 2016-09-22 LAB — BLOOD GAS, ARTERIAL
Acid-Base Excess: 5 mmol/L — ABNORMAL HIGH (ref 0.0–2.0)
Acid-Base Excess: 6.5 mmol/L — ABNORMAL HIGH (ref 0.0–2.0)
BICARBONATE: 30.5 mmol/L — AB (ref 20.0–28.0)
Bicarbonate: 29.6 mmol/L — ABNORMAL HIGH (ref 20.0–28.0)
Drawn by: 419771
O2 CONTENT: 8 L/min
O2 Content: 3.5 L/min
O2 Saturation: 98 %
O2 Saturation: 94.6 %
PATIENT TEMPERATURE: 98.6
PATIENT TEMPERATURE: 98.6
PCO2 ART: 49.6 mmHg — AB (ref 32.0–48.0)
PCO2 ART: 43.9 mmHg (ref 32.0–48.0)
PO2 ART: 117 mmHg — AB (ref 83.0–108.0)
PO2 ART: 73.1 mmHg — AB (ref 83.0–108.0)
pH, Arterial: 7.394 (ref 7.350–7.450)
pH, Arterial: 7.456 — ABNORMAL HIGH (ref 7.350–7.450)

## 2016-09-22 LAB — BASIC METABOLIC PANEL
ANION GAP: 9 (ref 5–15)
BUN: 25 mg/dL — AB (ref 6–20)
CO2: 31 mmol/L (ref 22–32)
Calcium: 9.2 mg/dL (ref 8.9–10.3)
Chloride: 97 mmol/L — ABNORMAL LOW (ref 101–111)
Creatinine, Ser: 1.04 mg/dL (ref 0.61–1.24)
GFR calc Af Amer: >60 mL/min (ref >60)
GFR calc non Af Amer: >60 mL/min (ref >60)
GLUCOSE: 216 mg/dL — AB (ref 65–99)
POTASSIUM: 4.6 mmol/L (ref 3.5–5.1)
Sodium: 137 mmol/L (ref 135–145)

## 2016-09-22 LAB — GLUCOSE, CAPILLARY
GLUCOSE-CAPILLARY: 224 mg/dL — AB (ref 65–99)
Glucose-Capillary: 159 mg/dL — ABNORMAL HIGH (ref 65–99)

## 2016-09-22 LAB — CBC
HEMATOCRIT: 39.1 % (ref 39.0–52.0)
HEMOGLOBIN: 12.6 g/dL — AB (ref 13.0–17.0)
MCH: 28.2 pg (ref 26.0–34.0)
MCHC: 32.2 g/dL (ref 30.0–36.0)
MCV: 87.5 fL (ref 78.0–100.0)
Platelets: 235 10*3/uL (ref 150–400)
RBC: 4.47 MIL/uL (ref 4.22–5.81)
RDW: 13.2 % (ref 11.5–15.5)
WBC: 14 10*3/uL — ABNORMAL HIGH (ref 4.0–10.5)

## 2016-09-22 LAB — MRSA PCR SCREENING: MRSA BY PCR: NEGATIVE

## 2016-09-22 MED ORDER — METHYLPREDNISOLONE SODIUM SUCC 125 MG IJ SOLR
80.0000 mg | Freq: Four times a day (QID) | INTRAMUSCULAR | Status: DC
  Administered 2016-09-22 – 2016-09-23 (×5): 80 mg via INTRAVENOUS
  Filled 2016-09-22: qty 2
  Filled 2016-09-22: qty 1.28
  Filled 2016-09-22: qty 2
  Filled 2016-09-22 (×2): qty 1.28
  Filled 2016-09-22 (×2): qty 2

## 2016-09-22 MED ORDER — LEVOFLOXACIN 750 MG PO TABS
750.0000 mg | ORAL_TABLET | Freq: Every day | ORAL | Status: DC
  Administered 2016-09-22: 750 mg via ORAL
  Filled 2016-09-22: qty 1

## 2016-09-22 MED ORDER — MORPHINE SULFATE (PF) 2 MG/ML IV SOLN: 1.0000 mg | Freq: Once | INTRAVENOUS | Status: AC

## 2016-09-22 MED ORDER — DOXYCYCLINE HYCLATE 100 MG PO TABS
100.0000 mg | ORAL_TABLET | Freq: Two times a day (BID) | ORAL | Status: DC
  Administered 2016-09-22 – 2016-09-26 (×8): 100 mg via ORAL
  Filled 2016-09-22 (×10): qty 1

## 2016-09-22 MED ORDER — IOPAMIDOL (ISOVUE-370) INJECTION 76%
INTRAVENOUS | Status: AC
  Administered 2016-09-22: 100 mL
  Filled 2016-09-22: qty 100

## 2016-09-22 MED ORDER — CHLORHEXIDINE GLUCONATE 0.12 % MT SOLN
15.0000 mL | Freq: Two times a day (BID) | OROMUCOSAL | Status: DC
  Administered 2016-09-22 – 2016-09-25 (×5): 15 mL via OROMUCOSAL
  Filled 2016-09-22 (×4): qty 15

## 2016-09-22 MED ORDER — GUAIFENESIN ER 600 MG PO TB12
600.0000 mg | ORAL_TABLET | Freq: Two times a day (BID) | ORAL | Status: DC
  Administered 2016-09-22 – 2016-09-26 (×9): 600 mg via ORAL
  Filled 2016-09-22 (×10): qty 1

## 2016-09-22 MED ORDER — ORAL CARE MOUTH RINSE
15.0000 mL | Freq: Two times a day (BID) | OROMUCOSAL | Status: DC
  Administered 2016-09-22 – 2016-09-26 (×6): 15 mL via OROMUCOSAL

## 2016-09-22 MED ORDER — MORPHINE SULFATE (PF) 2 MG/ML IV SOLN
1.0000 mg | Freq: Once | INTRAVENOUS | Status: AC
  Administered 2016-09-22: 1 mg via INTRAVENOUS
  Filled 2016-09-22: qty 1

## 2016-09-22 MED ORDER — LEVOFLOXACIN IN D5W 750 MG/150ML IV SOLN: 750.0000 mg | INTRAVENOUS | Status: DC

## 2016-09-22 MED ORDER — PANTOPRAZOLE SODIUM 40 MG IV SOLR
40.0000 mg | INTRAVENOUS | Status: DC
  Administered 2016-09-22 – 2016-09-24 (×3): 40 mg via INTRAVENOUS
  Filled 2016-09-22 (×3): qty 40

## 2016-09-22 MED ORDER — INSULIN ASPART 100 UNIT/ML ~~LOC~~ SOLN
0.0000 [IU] | Freq: Three times a day (TID) | SUBCUTANEOUS | Status: DC
  Administered 2016-09-22: 3 [IU] via SUBCUTANEOUS
  Administered 2016-09-23: 2 [IU] via SUBCUTANEOUS
  Administered 2016-09-23: 3 [IU] via SUBCUTANEOUS
  Administered 2016-09-23 – 2016-09-24 (×3): 2 [IU] via SUBCUTANEOUS
  Administered 2016-09-24: 3 [IU] via SUBCUTANEOUS
  Administered 2016-09-25 (×3): 2 [IU] via SUBCUTANEOUS
  Administered 2016-09-26: 1 [IU] via SUBCUTANEOUS
  Administered 2016-09-26: 2 [IU] via SUBCUTANEOUS

## 2016-09-22 MED ORDER — MORPHINE SULFATE (PF) 2 MG/ML IV SOLN
INTRAVENOUS | Status: AC
  Administered 2016-09-22: 2 mg via INTRAVENOUS
  Filled 2016-09-22: qty 1

## 2016-09-22 NOTE — Progress Notes (Addendum)
Inpatient Diabetes Program Recommendations  AACE/ADA: New Consensus Statement on Inpatient Glycemic Control (2015)  Target Ranges:  Prepandial:   less than 140 mg/dL      Peak postprandial:   less than 180 mg/dL (1-2 hours)      Critically ill patients:  140 - 180 mg/dL   Lab Results  Component Value Date   GLUCAP 133 (H) 07/05/2015   Results for CREW, GOREN (MRN [id number]) as of 09/22/2016 11:20  Ref. Range 09/19/2016 19:15 09/22/2016 03:48  Glucose Latest Ref Range: 65 - 99 mg/dL 409 (H) 811 (H)   Review of Glycemic Control  Diabetes history: none, steroids Outpatient Diabetes medications: none Current orders for Inpatient glycemic control: none  Inpatient Diabetes Program Recommendations:  Please consider sensitive correction scale Novolog 0-9 units TIDAC and 0-5 units QHS.  Per ADA recommendations "consider performing an A1C on all patients with diabetes or hyperglycemia admitted to the hospital if not performed in the prior 3 months".  Text page sent to Dr. Jomarie Longs.  Thank you,  Kristine Linea, RN, MSN Diabetes Coordinator Inpatient Diabetes Program [phone] (Team Pager)

## 2016-09-22 NOTE — Progress Notes (Signed)
1.  1501 - DuoNeb given to pt by respiratory. 2.  1527 Darin Engels, NT, informs me that pt is having difficulty breathing. 3.  Go to room to witness pt sitting on side of bed, with distressed breathing, on 2L [place]. 4.  Call Elbert Ewings, Respiratory team.  She states that since she had already given pt a DuoNeb less than 30 minutes ago, she could not given him another treatment for an additional 30 minutes (PRN neb treatments were ordered q1h). 4.  Call RRT.  In the meantime, pt was placed on non-rebreather at 10L O2. 5.  Angelique Blonder, RRT, arrives.  At 1539, Angelique Blonder decides to give pt the 2nd albuterol, bec the pt was clearly in distress. 6.  Notify Dr. Jomarie Longs.  7.  Dr. Jomarie Longs arrives to room, orders BiPap and ABGs; orders transfer to ICU; She also orders Morphine  IV once to give to pt, as he is a [place] vet and feels that he cannot tolerate the BiPap. 8.  I give ordered Morphine. 9.  Respiratory arrives to room and draws ABGs, placed BiPap on pt. 10. Report is called to receiving nurse on 2M12. 11. Pt stabilizes, and is transferred to 2M12, with Angelique Blonder, RRT, and Elbert Ewings, Respiratory, Zollie Beckers RN.

## 2016-09-22 NOTE — Progress Notes (Addendum)
Patient having an episode of labored breathing, O2 sats 98% on 3L via nasal cannula, gave scheduled nebulizer. Patient says he is breathing better now. Patient also looks less visibly distressed. Will continue to monitor patient closely. Respiratory called, MD notified.

## 2016-09-22 NOTE — Progress Notes (Signed)
Patient complained of chest pain with exhalation, Md notified. Gave morphine and did EKG. Will continue to monitor. Patient says Virgel Bouquet isn't helping him at all, says he takes Symbicort BID and it helps but he isn't getting it here. Also states he's been using his home albuterol inhaler about 6 times a day- which is way more often as usual. "I use it as a rescue inhaler".

## 2016-09-22 NOTE — Significant Event (Signed)
Rapid Response Event Note  Overview:  Called by RN for Respiratory distress Time Called: 1532 Arrival Time: 1540 Event Type: Respiratory  Initial Focused Assessment:  Called by RN for Respiratory distress.  On my arrival to patients room, Rn at bedside.  Patient sitting on the side of the bed in tripod position, accessory muscle use, not able to speak in complete sentences, RR 36-40, spo2 100%, on NRB.     Interventions: Breath sounds very minimal air movement with fine expiratory wheezes.  PRN albuterol treatment given.  Dr. Jomarie Longs called to bedside.  ABG done results given to Dr. Jomarie Longs.  Patient placed on BIPAP, given 1 mg IV morphine, tolerating BIPAP currently and appears more comfortable.  CCM at bedside.    Plan of Care (if not transferred):Patient transferred to 2 M 12 via bed with monitor and BIPAP in place.  Wife updated by primary RN  Event Summary:  at     at         [place]

## 2016-09-22 NOTE — Progress Notes (Addendum)
Patient states "I haven't been breathing right, my head hurts when I cough. I think I'll make it until daylight, but I don't know about after that". Questions about possible ABG in am.

## 2016-09-22 NOTE — Progress Notes (Signed)
PROGRESS NOTE    John Marsh  ZOX:[id number] DOB: Jul 21, 1945 DOA: 09/19/2016 PCP: Eartha Inch, MD  Brief Narrative:John Marsh is a 71 y.o. male with medical history significant for severe COPD with emphysema.  Still smokes about 1PPD which is actually down from his historical 3PPD.   patient presents with cough congestion shortness of breath and wheezing for 3 days he also had some chest pain 2 days ago which resolved after nitroglycerin  Assessment & Plan:   Principal Problem:  Severe COPD with exacerbation (HCC) -Chest x-ray without acute findings -had increased resp distress overnight -check CTA chest, increase solumedrol, add levaquin, continue nebs  -wean O2 as tolerated -Ambulate -Smoking cessation counseled  Mildly elevated troponin -Due to demand ischemia, non-ACS pattern -no acute changes on EKG, no chest pain now -2-D echocardiogram with normal EF and wall motion    CAD (coronary artery disease), native coronary artery -History of PCI, stent -Continue aspirin, Plavix, statin -Not on a beta blocker likely due to severe COPD  Tobacco abuse -Has cut down from 3 packs per day to one pack per day now -Counseled  DVT prophylaxis: Lovenox Code Status: Full code Family Communication: d/w wife at bedside Disposition Plan: Home in 2-3 says when resp ststaus improved    Subjective: Acutely short of breath last night, thought he would die  Objective: Vitals:   09/22/16 0359 09/22/16 0400 09/22/16 0751 09/22/16 1115  BP: (!) 148/62 137/73    Pulse: 77     Resp: (!) 22 (!) 24    Temp: 97.9 F (36.6 C)     TempSrc: Oral     SpO2: 96%  97% 98%  Weight: 71.5 kg (157 lb 9.6 oz)     Height:        Intake/Output Summary (Last 24 hours) at 09/22/16 1126 Last data filed at 09/22/16 0919  Gross per 24 hour  Intake              852 ml  Output                0 ml  Net              852 ml   Filed Weights   09/20/16 0444 09/21/16 0717 09/22/16 0359  Weight: 69.4  kg (153 lb) 69.4 kg (153 lb 1.6 oz) 71.5 kg (157 lb 9.6 oz)    Examination:  General exam: Frail elderly male, no distress Respiratory system: poor air movement, diffuse wheezes Cardiovascular system: S1 & S2 heard, RRR. No JVD, murmurs, rubs Gastrointestinal system: Abdomen is nondistended, soft and nontender. Normal bowel sounds heard. Central nervous system: Alert and oriented. No focal neurological deficits. Extremities: Symmetric 5 x 5 power. Skin: No rashes, lesions or ulcers Psychiatry: Judgement and insight appear normal. Mood & affect appropriate.     Data Reviewed:   CBC:  Recent Labs Lab 09/19/16 1915 09/22/16 0348  WBC 7.2 14.0*  NEUTROABS 5.9  --   HGB 13.5 12.6*  HCT 40.9 39.1  MCV 85.7 87.5  PLT 182 235   Basic Metabolic Panel:  Recent Labs Lab 09/19/16 1915 09/22/16 0348  NA 140 137  K 3.8 4.6  CL 102 97*  CO2 26 31  GLUCOSE 132* 216*  BUN 10 25*  CREATININE 0.95 1.04  CALCIUM 9.1 9.2   GFR: Estimated Creatinine Clearance: 64.9 mL/min (by C-G formula based on SCr of 1.04 mg/dL). Liver Function Tests:  Recent Labs Lab 09/19/16 1915  AST 31  ALT 24  ALKPHOS 75  BILITOT 0.6  PROT 6.9  ALBUMIN 4.0   No results for input(s): LIPASE, AMYLASE in the last 168 hours. No results for input(s): AMMONIA in the last 168 hours. Coagulation Profile: No results for input(s): INR, PROTIME in the last 168 hours. Cardiac Enzymes:  Recent Labs Lab 09/19/16 2118 09/20/16 0449 09/20/16 0834  TROPONINI 0.25* 0.10* 0.11*   BNP (last 3 results) No results for input(s): PROBNP in the last 8760 hours. HbA1C: No results for input(s): HGBA1C in the last 72 hours. CBG: No results for input(s): GLUCAP in the last 168 hours. Lipid Profile: No results for input(s): CHOL, HDL, LDLCALC, TRIG, CHOLHDL, LDLDIRECT in the last 72 hours. Thyroid Function Tests: No results for input(s): TSH, T4TOTAL, FREET4, T3FREE, THYROIDAB in the last 72 hours. Anemia  Panel: No results for input(s): VITAMINB12, FOLATE, FERRITIN, TIBC, IRON, RETICCTPCT in the last 72 hours. Urine analysis:    Component Value Date/Time   COLORURINE YELLOW 08/08/2012 1910   APPEARANCEUR CLEAR 08/08/2012 1910   LABSPEC 1.005 08/08/2012 1910   PHURINE 7.0 08/08/2012 1910   GLUCOSEU NEGATIVE 08/08/2012 1910   HGBUR NEGATIVE 08/08/2012 1910   BILIRUBINUR NEGATIVE 08/08/2012 1910   KETONESUR NEGATIVE 08/08/2012 1910   PROTEINUR NEGATIVE 08/08/2012 1910   UROBILINOGEN 0.2 08/08/2012 1910   NITRITE NEGATIVE 08/08/2012 1910   LEUKOCYTESUR TRACE (A) 08/08/2012 1910   Sepsis Labs: (procalcitonin:4,lacticidven:4)  )No results found for this or any previous visit (from the past 240 hour(s)).       Radiology Studies: Ct Angio Chest Pe W Or Wo Contrast  Result Date: 09/22/2016 CLINICAL DATA:  Increased shortness of breath and chest pain since last night EXAM: CT ANGIOGRAPHY CHEST WITH CONTRAST TECHNIQUE: Multidetector CT imaging of the chest was performed using the standard protocol during bolus administration of intravenous contrast. Multiplanar CT image reconstructions and MIPs were obtained to evaluate the vascular anatomy. CONTRAST:  80 mL Isovue 370 COMPARISON:  09/17/2014, 07/21/2015, 01/26/2016 FINDINGS: Cardiovascular: Satisfactory opacification of the pulmonary arteries to the segmental level. No evidence of pulmonary embolism. Normal heart size. No pericardial effusion. Normal caliber thoracic aorta. Mediastinum/Nodes: No enlarged mediastinal, hilar, or axillary lymph nodes. Thyroid gland, trachea, and esophagus demonstrate no significant findings. Lungs/Pleura: Severe centrilobular emphysema. No focal consolidation, pleural effusion or pneumothorax. Focal area of fibrosis in the right upper lobe measuring 2 cm with a small punctate calcification along the pleural surface. The area is unchanged compared with 01/26/2016. 6 x 12 mm spiculated pulmonary nodule at the  right lung apex unchanged compared with 07/21/2015, but new compared with 09/17/2014. Upper Abdomen: No acute abnormality. Musculoskeletal: No acute osseous abnormality. Mild thoracic spine spondylosis. No lytic or sclerotic osseous lesion. Review of the MIP images confirms the above findings. IMPRESSION: 1. No pulmonary embolus. 2.  Emphysema. (ICD10-J43.9) 3. 6 x 12 mm spiculated pulmonary nodule at the right lung apex unchanged compared with 07/21/2015, but new compared with 09/17/2014. A follow-up chest CT in 6 months is recommended for this high risk patient. This recommendation follows the consensus statement: Guidelines for Management of Incidental Pulmonary Nodules Detected on CT Images:From the Fleischner Society 2017; published online before print (10.1148/radiol.[id number]). Electronically Signed   By: Elige Ko   On: 09/22/2016 09:03   Dg Chest Port 1 View  Result Date: 09/21/2016 CLINICAL DATA:  Dyspnea. Increased work of breathing. Duration 2 days. EXAM: PORTABLE CHEST 1 VIEW COMPARISON:  09/19/2016 FINDINGS: Unchanged hyperinflation. Extensive emphysematous disease. No airspace consolidation. No effusion.  Normal vasculature. Normal heart size. Normal hilar and mediastinal contours. IMPRESSION: Emphysematous hyperinflation without evidence of superimposed consolidation or CHF. Electronically Signed   By: Ellery Plunk M.D.   On: 09/21/2016 21:10        Scheduled Meds: . aspirin EC  81 mg Oral Daily  . benzonatate  100 mg Oral TID  . clopidogrel  75 mg Oral Daily  . enoxaparin (LOVENOX) injection  40 mg Subcutaneous QHS  . feeding supplement (ENSURE ENLIVE)  237 mL Oral TID BM  . fluticasone furoate-vilanterol  1 puff Inhalation Daily  . guaiFENesin  600 mg Oral BID  . ipratropium-albuterol  3 mL Nebulization Q4H  . levofloxacin  750 mg Oral Daily  . methylPREDNISolone (SOLU-MEDROL) injection  80 mg Intravenous Q6H  . montelukast  5 mg Oral QHS  . multivitamin with minerals   1 tablet Oral Daily  . nicotine  21 mg Transdermal Daily  . pantoprazole  40 mg Oral Daily  . pramipexole  0.125 mg Oral QHS  . rosuvastatin  20 mg Oral QHS  . triamcinolone  2 spray Nasal Daily   Continuous Infusions:   LOS: 3 days    Time spent:    Zannie Cove, MD [facility] Pager [phone]  If 7PM-7AM, please contact night-coverage [email or web address] Password [email or web address] 09/22/2016, 11:26 AM

## 2016-09-22 NOTE — Progress Notes (Signed)
Called by RN/Rapid response RN to bedside Pt acutely short of breath, with very poor air movement, similar episode last night Severe COPD/current smoker with COPD exacerbation, CTA no PE/pneumonia  Acutely more dyspneic from this am, tachypneic/tripoding, using accessory muscles, but mentating ok On maximal medical therapy,  Check STAT ABG, BIPAP Transfer to ICU, PCCM consult, may need intubation if doesn't stabilize with BIPAP or doesn't tolerate BIPAP Continue Duonebs, IV solumedrol, levaquin  Zannie Cove, MD

## 2016-09-22 NOTE — Care Management Important Message (Signed)
Important Message  Patient Details  Name: John Marsh MRN: [id number] Date of Birth: 31-Dec-1945   Medicare Important Message Given:  Yes    Dorena Bodo 09/22/2016, 2:23 PM

## 2016-09-22 NOTE — Consult Note (Signed)
Name: John Marsh MRN: [id number] DOB: 1945/11/30    ADMISSION DATE:  09/19/2016 CONSULTATION DATE: 09/22/2016  REFERRING MD :  Dr. Jomarie Longs  CHIEF COMPLAINT:  Dyspnea  BRIEF PATIENT DESCRIPTION: 71 year old male with known severe COPD on home oxygen admitted 4/3 for COPD exacerbation. He was treated with steroids and bronchodilators. On 09/22/2016 he developed acute respiratory distress requiring BiPAP and was transferred to the ICU under PCCM care.  SIGNIFICANT EVENTS  4/3 admission for COPD exacerbation  STUDIES:  PFT  09/14/14: FVC 2.50 L (51%) FEV1 1.15 L (32%) FEV1/FVC 0.46 FEF 25-75 0.33 L (12%) positive bronchodilator response TLC 9.10 L (120%) RV 236% DLCO uncorrected 36% CTA chest 4/6 > No pulmonary embolus. Emphysema. 3. 6 x 12 mm spiculated pulmonary nodule at the right lung apex unchanged compared with 07/21/2015, but new compared with 09/17/2014. ECHO 4/5 > LVEF 55-60%, normal wall motion.    HISTORY OF PRESENT ILLNESS:  71 year old male with past medical history as below, which is significant for severe COPD on home oxygen, coronary artery disease status post percutaneous intervention, hypertension, and PAD. He continues to smoke about 1 pack per day. Initially he developed chest pain and associated shortness of breath approximately April 1 which resolved after nitroglycerin medication. Cough, shortness of breath, and wheezing progressively returned and became severe until the point where he presented to the emergency department on 4/3. He was markedly dyspneic and treated with nebulized bronchodilators and intravenous steroids. He is admitted to the hospitalist service and treated with oral steroids and scheduled bronchodilators. He was slow to respond was still having some periods of severe dyspnea and was eventually changed to intravenous steroids. 4/6 in the afternoon hours he became acutely dyspneic requiring BiPAP. There was concern he would require intubation and he was  transferred to the ICU.  Of note she is followed by Dr. Jamison Neighbor in the pulmonary clinic. He has a known history of severe COPD and is on home oxygen. He denies recent sick contacts. He does have contact with 1 dog and 7 cats at home, also with remote exposure to apparent. He has also had several potential environmental exposures due to the fact that he traveled the world with [employer] and was in [place]. 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  PAST MEDICAL HISTORY :   has a past medical history of COPD (chronic obstructive pulmonary disease) (HCC); Coronary heart disease; Emphysema of lung (HCC); Hyperlipidemia; Hypertension; MI (myocardial infarction); Multiple lung nodules on CT; Nocturnal hypoxia; Peripheral arterial disease (HCC); and Shortness of breath dyspnea.  has a past surgical history that includes stent placed; Cholecystectomy; Nasal septum surgery; Cardiac catheterization (N/A, 07/27/2015); and Cardiac catheterization (Bilateral, 07/27/2015). Prior to Admission medications   Medication Sig Start Date End Date Taking? Authorizing Provider  albuterol (PROVENTIL HFA;VENTOLIN HFA) 108 (90 BASE) MCG/ACT inhaler Inhale 2 puffs into the lungs every 6 (six) hours as needed for wheezing or shortness of breath. Normally takes 2 puffs every morning and takes daily as needed for rescue thereafter   Yes Historical Provider, MD  aspirin EC 81 MG tablet Take 81 mg by mouth daily.   Yes Historical Provider, MD  budesonide-formoterol (SYMBICORT) 160-4.5 MCG/ACT inhaler Inhale 2 puffs into the lungs 2 (two) times daily.   Yes Historical Provider, MD  clopidogrel (PLAVIX) 75 MG tablet Take 75 mg by mouth daily.   Yes Historical Provider, MD  ibuprofen (ADVIL,MOTRIN) 600 MG tablet Take 1 tablet (600 mg total) by mouth every 6 (  six) hours as needed. Patient taking differently: Take 600 mg by mouth every 6 (six) hours as needed for moderate pain.  07/01/16  Yes Erick Blinks, MD  montelukast  (SINGULAIR) 5 MG chewable tablet CHEW 1 TABLET BY MOUTH AT BEDTIME 02/01/16  Yes Roslynn Amble, MD  nitroGLYCERIN (NITROSTAT) 0.4 MG SL tablet Place 0.4 mg under the tongue every 5 (five) minutes as needed for chest pain. x3 doses as needed for chest pain   Yes Historical Provider, MD  pantoprazole (PROTONIX) 40 MG tablet Take 1 tablet (40 mg total) by mouth daily. 07/01/16  Yes Erick Blinks, MD  pramipexole (MIRAPEX) 0.125 MG tablet Take 1 tablet by mouth at bedtime. 06/21/16  Yes Historical Provider, MD  rosuvastatin (CRESTOR) 20 MG tablet Take 20 mg by mouth at bedtime.   Yes Historical Provider, MD  Spacer/Aero-Holding Chambers (AEROCHAMBER Z-STAT PLUS) inhaler Use as instructed 03/04/15  Yes Roslynn Amble, MD  tiotropium (SPIRIVA) 18 MCG inhalation capsule Place 18 mcg into inhaler and inhale daily.   Yes Historical Provider, MD  triamcinolone (NASACORT ALLERGY 24HR) 55 MCG/ACT AERO nasal inhaler Place 2 sprays into the nose daily.   Yes Historical Provider, MD  UNKNOWN TO PATIENT Apply 1 application topically at bedtime. Applied to nose daily-Prescribed-Name is unknown    Yes Historical Provider, MD   No Known Allergies  FAMILY HISTORY:  family history includes Heart disease in his brother, father, mother, and sister. SOCIAL HISTORY:  reports that he has been smoking Cigarettes.  He started smoking about 57 years ago. He has a 27.50 pack-year smoking history. He has never used smokeless tobacco. He reports that he does not drink alcohol or use drugs.  REVIEW OF SYSTEMS:   Constitutional: Negative for fever, chills, weight loss, malaise/fatigue and diaphoresis.  HENT: Negative for hearing loss, ear pain, nosebleeds, congestion, sore throat, neck pain, tinnitus and ear discharge.   Eyes: Negative for blurred vision, double vision, photophobia, pain, discharge and redness.  Respiratory: Negative for cough, hemoptysis, sputum production, shortness of breath, wheezing and stridor.     Cardiovascular: Negative for chest pain, palpitations, orthopnea, claudication, leg swelling and PND.  Gastrointestinal: Negative for heartburn, nausea, vomiting, abdominal pain, diarrhea, constipation, blood in stool and melena.  Genitourinary: Negative for dysuria, urgency, frequency, hematuria and flank pain.  Musculoskeletal: Negative for myalgias, back pain, joint pain and falls.  Skin: Negative for itching and rash.  Neurological: Negative for dizziness, tingling, tremors, sensory change, speech change, focal weakness, seizures, loss of consciousness, weakness and headaches.  Endo/Heme/Allergies: Negative for environmental allergies and polydipsia. Does not bruise/bleed easily.  SUBJECTIVE:   VITAL SIGNS: Temp:  [97.5 F (36.4 C)-98.4 F (36.9 C)] 98.4 F (36.9 C) (04/06 1224) Pulse Rate:  [77-113] 109 (04/06 1624) Resp:  [18-28] 28 (04/06 1624) BP: (137-177)/(55-103) 175/70 (04/06 1624) SpO2:  [94 %-100 %] 100 % (04/06 1624) Weight:  [71.5 kg (157 lb 9.6 oz)] 71.5 kg (157 lb 9.6 oz) (04/06 0359)  PHYSICAL EXAMINATION: General:  Thin elderly male in moderate respiratory distress on BiPAP, but tolerating well.  Neuro:  Alert, oriented, non-focal HEENT:  [place]/AT, PERRL, No JVD Cardiovascular:  Tachy, regular, no MRG Lungs:  Poor air movement, expiratory wheeze L base Abdomen:  Soft, non-tender, non-distended Musculoskeletal:  No acute deformity or ROM limitation Skin:  Grossly intact   Recent Labs Lab 09/19/16 1915 09/22/16 0348  NA 140 137  K 3.8 4.6  CL 102 97*  CO2 26 31  BUN 10 25*  CREATININE 0.95 1.04  GLUCOSE 132* 216*    Recent Labs Lab 09/19/16 1915 09/22/16 0348  HGB 13.5 12.6*  HCT 40.9 39.1  WBC 7.2 14.0*  PLT 182 235   Ct Angio Chest Pe W Or Wo Contrast  Result Date: 09/22/2016 CLINICAL DATA:  Increased shortness of breath and chest pain since last night EXAM: CT ANGIOGRAPHY CHEST WITH CONTRAST TECHNIQUE: Multidetector CT imaging of the chest  was performed using the standard protocol during bolus administration of intravenous contrast. Multiplanar CT image reconstructions and MIPs were obtained to evaluate the vascular anatomy. CONTRAST:  80 mL Isovue 370 COMPARISON:  09/17/2014, 07/21/2015, 01/26/2016 FINDINGS: Cardiovascular: Satisfactory opacification of the pulmonary arteries to the segmental level. No evidence of pulmonary embolism. Normal heart size. No pericardial effusion. Normal caliber thoracic aorta. Mediastinum/Nodes: No enlarged mediastinal, hilar, or axillary lymph nodes. Thyroid gland, trachea, and esophagus demonstrate no significant findings. Lungs/Pleura: Severe centrilobular emphysema. No focal consolidation, pleural effusion or pneumothorax. Focal area of fibrosis in the right upper lobe measuring 2 cm with a small punctate calcification along the pleural surface. The area is unchanged compared with 01/26/2016. 6 x 12 mm spiculated pulmonary nodule at the right lung apex unchanged compared with 07/21/2015, but new compared with 09/17/2014. Upper Abdomen: No acute abnormality. Musculoskeletal: No acute osseous abnormality. Mild thoracic spine spondylosis. No lytic or sclerotic osseous lesion. Review of the MIP images confirms the above findings. IMPRESSION: 1. No pulmonary embolus. 2.  Emphysema. (ICD10-J43.9) 3. 6 x 12 mm spiculated pulmonary nodule at the right lung apex unchanged compared with 07/21/2015, but new compared with 09/17/2014. A follow-up chest CT in 6 months is recommended for this high risk patient. This recommendation follows the consensus statement: Guidelines for Management of Incidental Pulmonary Nodules Detected on CT Images:From the Fleischner Society 2017; published online before print (10.1148/[id number]). Electronically Signed   By: Elige Ko   On: 09/22/2016 09:03   Dg Chest Port 1 View  Result Date: 09/21/2016 CLINICAL DATA:  Dyspnea. Increased work of breathing. Duration 2 days. EXAM: PORTABLE  CHEST 1 VIEW COMPARISON:  09/19/2016 FINDINGS: Unchanged hyperinflation. Extensive emphysematous disease. No airspace consolidation. No effusion. Normal vasculature. Normal heart size. Normal hilar and mediastinal contours. IMPRESSION: Emphysematous hyperinflation without evidence of superimposed consolidation or CHF. Electronically Signed   By: Ellery Plunk M.D.   On: 09/21/2016 21:10    ASSESSMENT / PLAN:  Acute on chronic hypercarbic respiratory failure: Secondary to acute exacerbation of COPD. - BiPAP as needed for work of breathing - Scheduled nebulized bronchodilators - Solumedrol 60 mg every 8 hours - Repeat ABG 1 hour - Holding home medications of Symbicort,Spiriva - Continue home Singulair, Nasacort - Doxycycline for a seven-day course - Smoking cessation counseling  Nocturnal hypoxemia  - Supplemental oxygen to maintain oxygen saturation 90-95%   History of coronary artery disease - Telemetry monitoring - Continue home aspirin, Plavix  Elevated troponin: Trending down  Best Practice: VTE ppx: Lovenox Diet NPO Dispo: ICU  Joneen Roach, AGACNP-BC  Pulmonology/Critical Care Pager [phone] or [phone]  09/22/2016 4:55 PM

## 2016-09-23 LAB — CBC
HCT: 37.8 % — ABNORMAL LOW (ref 39.0–52.0)
HEMOGLOBIN: 12 g/dL — AB (ref 13.0–17.0)
MCH: 27.7 pg (ref 26.0–34.0)
MCHC: 31.7 g/dL (ref 30.0–36.0)
MCV: 87.3 fL (ref 78.0–100.0)
Platelets: 214 10*3/uL (ref 150–400)
RBC: 4.33 MIL/uL (ref 4.22–5.81)
RDW: 13.2 % (ref 11.5–15.5)
WBC: 11.9 10*3/uL — ABNORMAL HIGH (ref 4.0–10.5)

## 2016-09-23 LAB — GLUCOSE, CAPILLARY
GLUCOSE-CAPILLARY: 188 mg/dL — AB (ref 65–99)
GLUCOSE-CAPILLARY: 237 mg/dL — AB (ref 65–99)
Glucose-Capillary: 164 mg/dL — ABNORMAL HIGH (ref 65–99)
Glucose-Capillary: 142 mg/dL — ABNORMAL HIGH (ref 65–99)
Glucose-Capillary: 209 mg/dL — ABNORMAL HIGH (ref 65–99)

## 2016-09-23 LAB — MAGNESIUM: Magnesium: 2.4 mg/dL (ref 1.7–2.4)

## 2016-09-23 LAB — BASIC METABOLIC PANEL
Anion gap: 11 (ref 5–15)
BUN: 22 mg/dL — AB (ref 6–20)
CHLORIDE: 98 mmol/L — AB (ref 101–111)
CO2: 31 mmol/L (ref 22–32)
Calcium: 9.2 mg/dL (ref 8.9–10.3)
Creatinine, Ser: 1.06 mg/dL (ref 0.61–1.24)
GFR calc Af Amer: >60 mL/min (ref >60)
GFR calc non Af Amer: >60 mL/min (ref >60)
GLUCOSE: 198 mg/dL — AB (ref 65–99)
POTASSIUM: 4.7 mmol/L (ref 3.5–5.1)
Sodium: 140 mmol/L (ref 135–145)

## 2016-09-23 LAB — PHOSPHORUS: PHOSPHORUS: 3.6 mg/dL (ref 2.5–4.6)

## 2016-09-23 MED ORDER — BUDESONIDE 0.5 MG/2ML IN SUSP
0.5000 mg | Freq: Two times a day (BID) | RESPIRATORY_TRACT | Status: DC
  Administered 2016-09-23 – 2016-09-26 (×7): 0.5 mg via RESPIRATORY_TRACT
  Filled 2016-09-23 (×8): qty 2

## 2016-09-23 MED ORDER — ARFORMOTEROL TARTRATE 15 MCG/2ML IN NEBU
15.0000 ug | INHALATION_SOLUTION | Freq: Two times a day (BID) | RESPIRATORY_TRACT | Status: DC
  Administered 2016-09-23 – 2016-09-26 (×7): 15 ug via RESPIRATORY_TRACT
  Filled 2016-09-23 (×8): qty 2

## 2016-09-23 MED ORDER — METHYLPREDNISOLONE SODIUM SUCC 40 MG IJ SOLR
40.0000 mg | Freq: Four times a day (QID) | INTRAMUSCULAR | Status: DC
  Administered 2016-09-23 – 2016-09-25 (×8): 40 mg via INTRAVENOUS
  Filled 2016-09-23 (×9): qty 1

## 2016-09-23 MED ORDER — FLUTICASONE PROPIONATE 50 MCG/ACT NA SUSP
2.0000 | Freq: Every day | NASAL | Status: DC | PRN
  Administered 2016-09-23 – 2016-09-25 (×3): 2 via NASAL
  Filled 2016-09-23 (×2): qty 16

## 2016-09-23 MED ORDER — ACETAMINOPHEN 325 MG PO TABS
650.0000 mg | ORAL_TABLET | Freq: Four times a day (QID) | ORAL | Status: DC | PRN
  Administered 2016-09-23 – 2016-09-25 (×2): 650 mg via ORAL
  Filled 2016-09-23: qty 2

## 2016-09-23 NOTE — Progress Notes (Signed)
Name: John Marsh MRN: [id number] DOB: Dec 06, 1945    ADMISSION DATE:  09/19/2016 CONSULTATION DATE: 09/22/2016  REFERRING MD :  Dr. Jomarie Longs  CHIEF COMPLAINT:  Dyspnea  BRIEF PATIENT DESCRIPTION: 71 year old male with known severe COPD on home oxygen admitted 4/3 for COPD exacerbation. He was treated with steroids and bronchodilators. On 09/22/2016 he developed acute respiratory distress requiring BiPAP and was transferred to the ICU under PCCM care.  SIGNIFICANT EVENTS  4/3 admission for COPD exacerbation  STUDIES:  PFT  09/14/14: FVC 2.50 L (51%) FEV1 1.15 L (32%) FEV1/FVC 0.46 FEF 25-75 0.33 L (12%) positive bronchodilator response TLC 9.10 L (120%) RV 236% DLCO uncorrected 36% CTA chest 4/6 > No pulmonary embolus. Emphysema. 3. 6 x 12 mm spiculated pulmonary nodule at the right lung apex unchanged compared with 07/21/2015, but new compared with 09/17/2014. ECHO 4/5 > LVEF 55-60%, normal wall motion.    HISTORY OF PRESENT ILLNESS:  71 year old male with past medical history as below, which is significant for severe COPD on home oxygen, coronary artery disease status post percutaneous intervention, hypertension, and PAD. He continues to smoke about 1 pack per day. Initially he developed chest pain and associated shortness of breath approximately April 1 which resolved after nitroglycerin medication. Cough, shortness of breath, and wheezing progressively returned and became severe until the point where he presented to the emergency department on 4/3. He was markedly dyspneic and treated with nebulized bronchodilators and intravenous steroids. He is admitted to the hospitalist service and treated with oral steroids and scheduled bronchodilators. He was slow to respond was still having some periods of severe dyspnea and was eventually changed to intravenous steroids. 4/6 in the afternoon hours he became acutely dyspneic requiring BiPAP. There was concern he would require intubation and he was  transferred to the ICU.  Of note he is followed by Dr. Jamison Neighbor in the pulmonary clinic. He has a known history of severe COPD and is on home oxygen. He denies recent sick contacts. He does have contact with 1 dog and 7 cats at home, also with remote exposure to apparent. He has also had several potential environmental exposures due to the fact that he traveled the world with [employer] and was in [place]. 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   SUBJECTIVE:  Pt improved with bipap use.  Weaned off since last night. He hates Bipap with a passion.  He is significantly improved BUT remains SOB (-) cp. On and off cough   VITAL SIGNS: Temp:  [97.9 F (36.6 C)-98.4 F (36.9 C)] 97.9 F (36.6 C) (04/06 2344) Pulse Rate:  [75-114] 89 (04/07 0700) Resp:  [16-30] 25 (04/07 0700) BP: (121-184)/(55-105) 155/61 (04/07 0700) SpO2:  [87 %-100 %] 92 % (04/07 0827) FiO2 (%):  [30 %-40 %] 30 % (04/06 1933)  PHYSICAL EXAMINATION: General:  Thin elderly male in mild resp distress. Occasional accesory muscle use.  Neuro:  Alert, oriented, non-focal HEENT:  [place]/AT, PERRL, No JVD Cardiovascular:  Tachy, regular, no MRG Lungs:  Dec BS on BLF. Some wheeze in BLLF. (-) crackles/rhonchi Abdomen:  Soft, non-tender, non-distended Musculoskeletal:  No acute deformity or ROM limitation Skin:  Grossly intact   Recent Labs Lab 09/19/16 1915 09/22/16 0348 09/23/16 0232  NA 140 137 140  K 3.8 4.6 4.7  CL 102 97* 98*  CO2 BUN 10 25* 22*  CREATININE 0.95 1.04 1.06  GLUCOSE 132* 216* 198*    Recent Labs Lab 09/19/16  1915 09/22/16 0348 09/23/16 0232  HGB 13.5 12.6* 12.0*  HCT 40.9 39.1 37.8*  WBC 7.2 14.0* 11.9*  PLT 182 235 214   Ct Angio Chest Pe W Or Wo Contrast  Result Date: 09/22/2016 CLINICAL DATA:  Increased shortness of breath and chest pain since last night EXAM: CT ANGIOGRAPHY CHEST WITH CONTRAST TECHNIQUE: Multidetector CT imaging of the chest was performed using the  standard protocol during bolus administration of intravenous contrast. Multiplanar CT image reconstructions and MIPs were obtained to evaluate the vascular anatomy. CONTRAST:  80 mL Isovue 370 COMPARISON:  09/17/2014, 07/21/2015, 01/26/2016 FINDINGS: Cardiovascular: Satisfactory opacification of the pulmonary arteries to the segmental level. No evidence of pulmonary embolism. Normal heart size. No pericardial effusion. Normal caliber thoracic aorta. Mediastinum/Nodes: No enlarged mediastinal, hilar, or axillary lymph nodes. Thyroid gland, trachea, and esophagus demonstrate no significant findings. Lungs/Pleura: Severe centrilobular emphysema. No focal consolidation, pleural effusion or pneumothorax. Focal area of fibrosis in the right upper lobe measuring 2 cm with a small punctate calcification along the pleural surface. The area is unchanged compared with 01/26/2016. 6 x 12 mm spiculated pulmonary nodule at the right lung apex unchanged compared with 07/21/2015, but new compared with 09/17/2014. Upper Abdomen: No acute abnormality. Musculoskeletal: No acute osseous abnormality. Mild thoracic spine spondylosis. No lytic or sclerotic osseous lesion. Review of the MIP images confirms the above findings. IMPRESSION: 1. No pulmonary embolus. 2.  Emphysema. (ICD10-J43.9) 3. 6 x 12 mm spiculated pulmonary nodule at the right lung apex unchanged compared with 07/21/2015, but new compared with 09/17/2014. A follow-up chest CT in 6 months is recommended for this high risk patient. This recommendation follows the consensus statement: Guidelines for Management of Incidental Pulmonary Nodules Detected on CT Images:From the Fleischner Society 2017; published online before print (10.1148/[id number]). Electronically Signed   By: Elige Ko   On: 09/22/2016 09:03   Dg Chest Port 1 View  Result Date: 09/21/2016 CLINICAL DATA:  Dyspnea. Increased work of breathing. Duration 2 days. EXAM: PORTABLE CHEST 1 VIEW COMPARISON:   09/19/2016 FINDINGS: Unchanged hyperinflation. Extensive emphysematous disease. No airspace consolidation. No effusion. Normal vasculature. Normal heart size. Normal hilar and mediastinal contours. IMPRESSION: Emphysematous hyperinflation without evidence of superimposed consolidation or CHF. Electronically Signed   By: Ellery Plunk M.D.   On: 09/21/2016 21:10    ASSESSMENT / PLAN:  Acute on chronic hypoxemic hypercarbic respiratory failure: Secondary to severe  acute exacerbation of COPD 2/2 Bronchitis + Tobacco abuse - BiPAP as needed for work of breathing.  He "hates BiPAP with a passion". He is okay with using it if he is in severe respiratory distress. He has PTSD which makes it uncomfortable for him to wear something on his face. - Bipap prn - Scheduled nebulized bronchodilators. Start pulmicort and brovana and keep duoneb qid.  - Solumedrol >> we will decrease to 40 mg every 8 hours from 80 mg every 8 hours. - Holding home medications of Symbicort,Spiriva - Continue home Singulair, Nasacort - Doxycycline for a seven-day course for bronchitis - Smoking cessation counseling   Nocturnal hypoxemia  - Supplemental oxygen to maintain oxygen saturation > 88% - Currently he is on 2.5 L at bedtime and 2-3 liters during the daytime.   History of coronary artery disease. Demand ischemia.  - Telemetry monitoring - Continue home aspirin, Plavix   Best Practice: VTE ppx: Lovenox Diet Start diet.  Dispo: transfer to SDU potentially this pm if he remains stable and no need for Bipap by this pm.  Patient was in severe distress last night requiring BiPAP.   Pollie Meyer, MD 09/23/2016, 9:02 AM [facility] Pager [phone] After 3 pm or if no answer, call [phone]

## 2016-09-24 LAB — CBC
HEMATOCRIT: 37.3 % — AB (ref 39.0–52.0)
HEMOGLOBIN: 11.9 g/dL — AB (ref 13.0–17.0)
MCH: 27.7 pg (ref 26.0–34.0)
MCHC: 31.9 g/dL (ref 30.0–36.0)
MCV: 86.7 fL (ref 78.0–100.0)
Platelets: 208 10*3/uL (ref 150–400)
RBC: 4.3 MIL/uL (ref 4.22–5.81)
RDW: 13.1 % (ref 11.5–15.5)
WBC: 12 10*3/uL — ABNORMAL HIGH (ref 4.0–10.5)

## 2016-09-24 LAB — BASIC METABOLIC PANEL
Anion gap: 10 (ref 5–15)
BUN: 26 mg/dL — AB (ref 6–20)
CALCIUM: 8.9 mg/dL (ref 8.9–10.3)
CHLORIDE: 99 mmol/L — AB (ref 101–111)
CO2: 29 mmol/L (ref 22–32)
CREATININE: 0.99 mg/dL (ref 0.61–1.24)
GFR calc non Af Amer: >60 mL/min (ref >60)
GLUCOSE: 207 mg/dL — AB (ref 65–99)
Potassium: 4.2 mmol/L (ref 3.5–5.1)
Sodium: 138 mmol/L (ref 135–145)

## 2016-09-24 LAB — GLUCOSE, CAPILLARY
GLUCOSE-CAPILLARY: 185 mg/dL — AB (ref 65–99)
GLUCOSE-CAPILLARY: 213 mg/dL — AB (ref 65–99)
Glucose-Capillary: 186 mg/dL — ABNORMAL HIGH (ref 65–99)
Glucose-Capillary: 175 mg/dL — ABNORMAL HIGH (ref 65–99)

## 2016-09-24 LAB — MAGNESIUM: Magnesium: 2.3 mg/dL (ref 1.7–2.4)

## 2016-09-24 LAB — PHOSPHORUS: PHOSPHORUS: 3.3 mg/dL (ref 2.5–4.6)

## 2016-09-24 MED ORDER — CLONIDINE HCL 0.1 MG PO TABS
0.1000 mg | ORAL_TABLET | Freq: Three times a day (TID) | ORAL | Status: DC | PRN
  Administered 2016-09-24: 0.1 mg via ORAL
  Filled 2016-09-24: qty 1

## 2016-09-24 MED ORDER — ALBUTEROL SULFATE (2.5 MG/3ML) 0.083% IN NEBU: 2.5000 mg | INHALATION_SOLUTION | RESPIRATORY_TRACT | Status: DC | PRN

## 2016-09-24 MED ORDER — IPRATROPIUM-ALBUTEROL 0.5-2.5 (3) MG/3ML IN SOLN
3.0000 mL | Freq: Four times a day (QID) | RESPIRATORY_TRACT | Status: DC
  Administered 2016-09-24 – 2016-09-25 (×5): 3 mL via RESPIRATORY_TRACT
  Filled 2016-09-24 (×5): qty 3

## 2016-09-24 NOTE — Progress Notes (Addendum)
Name: John Marsh MRN: [id number] DOB: 1945-12-04    ADMISSION DATE:  09/19/2016 CONSULTATION DATE: 09/22/2016  REFERRING MD :  Dr. Jomarie Longs  CHIEF COMPLAINT:  Dyspnea  BRIEF PATIENT DESCRIPTION: 71 year old male with known severe COPD on home oxygen admitted 4/3 for COPD exacerbation. He was treated with steroids and bronchodilators. On 09/22/2016 he developed acute respiratory distress requiring BiPAP and was transferred to the ICU under PCCM care.  SIGNIFICANT EVENTS  4/3 admission for COPD exacerbation  STUDIES:  PFT  09/14/14: FVC 2.50 L (51%) FEV1 1.15 L (32%) FEV1/FVC 0.46 FEF 25-75 0.33 L (12%) positive bronchodilator response TLC 9.10 L (120%) RV 236% DLCO uncorrected 36% CTA chest 4/6 > No pulmonary embolus. Emphysema. 3. 6 x 12 mm spiculated pulmonary nodule at the right lung apex unchanged compared with 07/21/2015, but new compared with 09/17/2014. ECHO 4/5 > LVEF 55-60%, normal wall motion.    HISTORY OF PRESENT ILLNESS:  71 year old male with past medical history as below, which is significant for severe COPD on home oxygen, coronary artery disease status post percutaneous intervention, hypertension, and PAD. He continues to smoke about 1 pack per day. Initially he developed chest pain and associated shortness of breath approximately April 1 which resolved after nitroglycerin medication. Cough, shortness of breath, and wheezing progressively returned and became severe until the point where he presented to the emergency department on 4/3. He was markedly dyspneic and treated with nebulized bronchodilators and intravenous steroids. He is admitted to the hospitalist service and treated with oral steroids and scheduled bronchodilators. He was slow to respond was still having some periods of severe dyspnea and was eventually changed to intravenous steroids. 4/6 in the afternoon hours he became acutely dyspneic requiring BiPAP. There was concern he would require intubation and he was  transferred to the ICU.  Of note he is followed by Dr. Jamison Neighbor in the pulmonary clinic. He has a known history of severe COPD and is on home oxygen. He denies recent sick contacts. He does have contact with 1 dog and 7 cats at home, also with remote exposure to apparent. He has also had several potential environmental exposures due to the fact that he traveled the world with [employer] and was in [place]. 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   SUBJECTIVE:  Patient has maintained on [place] O2, he had attempted BiPAP last night but was too uncomfortable. He endorses some shortness of breath early this morning, though this improved with breathing treatments. No further chest pain.   VITAL SIGNS: Temp:  [97.6 F (36.4 C)-98 F (36.7 C)] 98 F (36.7 C) (04/08 0400) Pulse Rate:  [70-100] 86 (04/08 0600) Resp:  [15-36] 24 (04/08 0600) BP: (112-165)/(51-81) 151/81 (04/08 0600) SpO2:  [87 %-97 %] 92 % (04/08 0648) Weight:  [71.7 kg (158 lb)] 71.7 kg (158 lb) (04/08 0600)  PHYSICAL EXAMINATION: General:  Thin elderly male.  Neuro:  Alert, oriented, non-focal HEENT:  [place]/AT, PERRL, No JVD Cardiovascular:  Tachy, regular, no MRG Lungs:  Decreased breath sounds throughout, mild expiratory wheezing bil; no rales or rhonchi Abdomen:  Soft, non-tender, non-distended Musculoskeletal:  No acute deformity or ROM limitation Skin:  Grossly intact   Recent Labs Lab 09/22/16 0348 09/23/16 0232 09/24/16 0227  NA 137 140 138  K 4.6 4.7 4.2  CL 97* 98* 99*  CO2 BUN 25* 22* 26*  CREATININE 1.04 1.06 0.99  GLUCOSE 216* 198* 207*    Recent Labs Lab 09/22/16  1610 09/23/16 0232 09/24/16 0227  HGB 12.6* 12.0* 11.9*  HCT 39.1 37.8* 37.3*  WBC 14.0* 11.9* 12.0*  PLT 235 214 208   Ct Angio Chest Pe W Or Wo Contrast  Result Date: 09/22/2016 CLINICAL DATA:  Increased shortness of breath and chest pain since last night EXAM: CT ANGIOGRAPHY CHEST WITH CONTRAST TECHNIQUE:  Multidetector CT imaging of the chest was performed using the standard protocol during bolus administration of intravenous contrast. Multiplanar CT image reconstructions and MIPs were obtained to evaluate the vascular anatomy. CONTRAST:  80 mL Isovue 370 COMPARISON:  09/17/2014, 07/21/2015, 01/26/2016 FINDINGS: Cardiovascular: Satisfactory opacification of the pulmonary arteries to the segmental level. No evidence of pulmonary embolism. Normal heart size. No pericardial effusion. Normal caliber thoracic aorta. Mediastinum/Nodes: No enlarged mediastinal, hilar, or axillary lymph nodes. Thyroid gland, trachea, and esophagus demonstrate no significant findings. Lungs/Pleura: Severe centrilobular emphysema. No focal consolidation, pleural effusion or pneumothorax. Focal area of fibrosis in the right upper lobe measuring 2 cm with a small punctate calcification along the pleural surface. The area is unchanged compared with 01/26/2016. 6 x 12 mm spiculated pulmonary nodule at the right lung apex unchanged compared with 07/21/2015, but new compared with 09/17/2014. Upper Abdomen: No acute abnormality. Musculoskeletal: No acute osseous abnormality. Mild thoracic spine spondylosis. No lytic or sclerotic osseous lesion. Review of the MIP images confirms the above findings. IMPRESSION: 1. No pulmonary embolus. 2.  Emphysema. (ICD10-J43.9) 3. 6 x 12 mm spiculated pulmonary nodule at the right lung apex unchanged compared with 07/21/2015, but new compared with 09/17/2014. A follow-up chest CT in 6 months is recommended for this high risk patient. This recommendation follows the consensus statement: Guidelines for Management of Incidental Pulmonary Nodules Detected on CT Images:From the Fleischner Society 2017; published online before print (10.1148/[id number]). Electronically Signed   By: Elige Ko   On: 09/22/2016 09:03    ASSESSMENT / PLAN:  Acute on chronic hypoxemic hypercarbic respiratory failure: Secondary  to severe acute exacerbation of COPD 2/2 Bronchitis. CTA 4/6 negative for PE - BiPAP as needed for work of breathing.  He "hates BiPAP with a passion". He is okay with using it if he is in severe respiratory distress. He has PTSD which makes it uncomfortable for him to wear something on his face. - Bipap prn - Scheduled nebulized bronchodilators - Pulmicort and brovana; duoneb qid.  - Solumedrol 40 mg every 8 hours - Holding home medications of Symbicort,Spiriva - Continue home Singulair, Nasacort - Doxycycline for a seven-day course for bronchitis  Nocturnal hypoxemia:  - Supplemental oxygen to maintain oxygen saturation > 88% - Currently he is on 2.5 L at bedtime and 2-3 liters during the daytime.  History of coronary artery disease: Demand ischemia on admission.  - Telemetry monitoring - Continue home aspirin, Plavix, crestor  H/o pulmonary nodule: R apex spiculated pulm nodule stable on 09/22/16 from prior study. - f/u CT imaging in 6 months  VTE ppx: Lovenox Diet: HH  Dispo: transfer to SDU with TRH, pulm/crit will follow.  Nyra Market, MD IMTS - PGY1  ATTENDING NOTE / ATTESTATION NOTE :   I have discussed the case with the resident/APP  Dr. Samuella Cota  I agree with the resident/APP's  history, physical examination, assessment, and plans.    I have edited the above note and modified it according to our agreed history, physical examination, assessment and plan.   Patient admitted with acute on chronic hypoxemic respiratory failure secondary to acute exacerbation of COPD secondary to bronchitis.  He was transferred to ICU 2 nights ago. He was placed on BiPAP which he used for several hours. Eventually he got better and refused BiPAP unless absolutely necessary. He has been off BiPAP at least 48 hours now. Plan to transfer to stepdown unit. He still has increased work of breathing but better. Continue IV steroids, nebulizer meds, antibiotics. Smoking cessation done. BiPAP only  when absolutely necessary per patient.  Family :  No family at bedside.    Pollie Meyer, MD 09/24/2016, 8:09 PM [facility] Pager [phone] After 3 pm or if no answer, call [phone]

## 2016-09-24 NOTE — Progress Notes (Signed)
eLink Physician-Brief Progress Note Patient Name: John Marsh DOB: 1945/11/12 MRN: [id number]   Date of Service  09/24/2016  HPI/Events of Note  HTN - BP = 162/111.  eICU Interventions  Will order: 1.Catapres 0.1 mg PO Q 8 hours PRN SBP > 170 or DBP > 100.     Intervention Category Intermediate Interventions: Hypertension - evaluation and management  [name] [place] 09/24/2016, 6:30 PM

## 2016-09-25 LAB — PHOSPHORUS: Phosphorus: 3.5 mg/dL (ref 2.5–4.6)

## 2016-09-25 LAB — GLUCOSE, CAPILLARY
GLUCOSE-CAPILLARY: 185 mg/dL — AB (ref 65–99)
GLUCOSE-CAPILLARY: 197 mg/dL — AB (ref 65–99)
Glucose-Capillary: 163 mg/dL — ABNORMAL HIGH (ref 65–99)
Glucose-Capillary: 165 mg/dL — ABNORMAL HIGH (ref 65–99)

## 2016-09-25 LAB — CBC
HCT: 38.1 % — ABNORMAL LOW (ref 39.0–52.0)
HEMOGLOBIN: 12.4 g/dL — AB (ref 13.0–17.0)
MCH: 28.2 pg (ref 26.0–34.0)
MCHC: 32.5 g/dL (ref 30.0–36.0)
MCV: 86.6 fL (ref 78.0–100.0)
Platelets: 209 10*3/uL (ref 150–400)
RBC: 4.4 MIL/uL (ref 4.22–5.81)
RDW: 13.1 % (ref 11.5–15.5)
WBC: 11.3 10*3/uL — ABNORMAL HIGH (ref 4.0–10.5)

## 2016-09-25 LAB — BASIC METABOLIC PANEL
Anion gap: 10 (ref 5–15)
BUN: 29 mg/dL — AB (ref 6–20)
CHLORIDE: 99 mmol/L — AB (ref 101–111)
CO2: 31 mmol/L (ref 22–32)
Calcium: 9 mg/dL (ref 8.9–10.3)
Creatinine, Ser: 1.01 mg/dL (ref 0.61–1.24)
GFR calc Af Amer: >60 mL/min (ref >60)
GFR calc non Af Amer: >60 mL/min (ref >60)
GLUCOSE: 220 mg/dL — AB (ref 65–99)
POTASSIUM: 4.4 mmol/L (ref 3.5–5.1)
SODIUM: 140 mmol/L (ref 135–145)

## 2016-09-25 LAB — MAGNESIUM: MAGNESIUM: 2.3 mg/dL (ref 1.7–2.4)

## 2016-09-25 MED ORDER — PREDNISONE 20 MG PO TABS
40.0000 mg | ORAL_TABLET | Freq: Every day | ORAL | Status: DC
  Administered 2016-09-25 – 2016-09-26 (×2): 40 mg via ORAL
  Filled 2016-09-25 (×2): qty 2

## 2016-09-25 MED ORDER — IPRATROPIUM-ALBUTEROL 0.5-2.5 (3) MG/3ML IN SOLN
3.0000 mL | RESPIRATORY_TRACT | Status: DC | PRN
  Administered 2016-09-26: 3 mL via RESPIRATORY_TRACT
  Filled 2016-09-25: qty 3

## 2016-09-25 MED ORDER — PANTOPRAZOLE SODIUM 40 MG PO TBEC
40.0000 mg | DELAYED_RELEASE_TABLET | Freq: Every day | ORAL | Status: DC
  Administered 2016-09-25 – 2016-09-26 (×2): 40 mg via ORAL
  Filled 2016-09-25 (×2): qty 1

## 2016-09-25 MED ORDER — METHYLPREDNISOLONE SODIUM SUCC 125 MG IJ SOLR
INTRAMUSCULAR | Status: AC
  Administered 2016-09-25: 125 mg
  Filled 2016-09-25: qty 2

## 2016-09-25 NOTE — Discharge Summary (Addendum)
Physician Discharge Summary  John Marsh ZOX:[id number] DOB: 08-Jun-1946 DOA: 09/19/2016  PCP: Eartha Inch, MD  Admit date: 09/19/2016 Discharge date: 09/26/2016  Admitted From: Home Disposition: Home  Recommendations for Outpatient Follow-up:  1. Follow up with PCP in 1 week 2. Follow-up with pulmonology in 2 weeks  Equipment/Devices: Oxygen, nebulizer  Discharge Condition: Stable CODE STATUS: Full code Diet recommendation: Heart healthy   Brief/Interim Summary:  Admission HPI written by Hillary Bow, DO   Chief Complaint: Respiratory distress  HPI: John Marsh is a 71 y.o. male with medical history significant of very severe COPD with emphysema.  Still smokes about 1PPD which is actually down from his historical 3PPD.  Patient's symptoms onset 3 days ago, worsening, associated chest pain this AM that resolved after 2 NTG.  Given duonebs, mag, solumedrol, breath CAT with some improvement.   ED Course: Some improvement in ED, still with accessory muscle use.  Trop 0.28.    Hospital course:  Severe COPD with exacerbation Patient improved with regimen of antibiotics, steroids and nebulizer treatments. On 4/6, patient developed severe respiratory distress requiring transfer to the ICU. Critical care evaluated and had patient on BiPAP, avoiding intubation. Patient's respiratory status improved and he was transferred out of the ICU on 4/8. Doxycycline, steroids and bronchodilators/corticosteroids/anticholinergic were continued with continued improvement. Patient was ambulated with oxygen and home oxygen set up for patient before discharge. Patient to leave with an approximate two week taper of prednisone, continuation of doxycycline regimen (7 days total) and continued inhaler use. Nebulizer at discharge.  Mildly elevated troponin EKG not suggestive of ACS and likely demand ischemia. Asymptomatic.  CAD Continued Aspirin, Plavix and statin.  Tobacco abuse Cutting down.  Counseled. Nicotine patch at discharge.  Severe protein calorie malnutrition Protein supplement.  Discharge Diagnoses:  Principal Problem:   COPD exacerbation (HCC) Active Problems:   CAD (coronary artery disease), native coronary artery   COPD (chronic obstructive pulmonary disease) with emphysema (HCC)   Elevated troponin   Acute respiratory failure with hypoxia (HCC)   Protein-calorie malnutrition, severe    Discharge Instructions   Allergies as of 09/26/2016   No Known Allergies     Medication List    STOP taking these medications   UNKNOWN TO PATIENT     TAKE these medications   AEROCHAMBER Z-STAT PLUS inhaler Use as instructed   albuterol (2.5 MG/3ML) 0.083% nebulizer solution Commonly known as:  PROVENTIL Take 3 mLs (2.5 mg total) by nebulization every 4 (four) hours as needed for wheezing or shortness of breath. What changed:  You were already taking a medication with the same name, and this prescription was added. Make sure you understand how and when to take each.   albuterol 108 (90 Base) MCG/ACT inhaler Commonly known as:  PROVENTIL HFA;VENTOLIN HFA Inhale 2 puffs into the lungs every 6 (six) hours as needed for wheezing or shortness of breath. Normally takes 2 puffs every morning and takes daily as needed for rescue thereafter What changed:  Another medication with the same name was added. Make sure you understand how and when to take each.   aspirin EC 81 MG tablet Take 81 mg by mouth daily.   benzonatate 100 MG capsule Commonly known as:  TESSALON Take 1 capsule (100 mg total) by mouth 3 (three) times daily.   budesonide-formoterol 160-4.5 MCG/ACT inhaler Commonly known as:  SYMBICORT Inhale 2 puffs into the lungs 2 (two) times daily.   clopidogrel 75 MG tablet Commonly known as:  PLAVIX Take 75 mg by mouth daily.   doxycycline 100 MG tablet Commonly known as:  VIBRA-TABS Take 1 tablet (100 mg total) by mouth every 12 (twelve) hours. Start  taking on:  09/27/2016   feeding supplement (ENSURE ENLIVE) Liqd Take 237 mLs by mouth 3 (three) times daily between meals.   fluticasone 50 MCG/ACT nasal spray Commonly known as:  FLONASE Place 2 sprays into both nostrils daily as needed for allergies or rhinitis.   guaiFENesin 600 MG 12 hr tablet Commonly known as:  MUCINEX Take 1 tablet (600 mg total) by mouth 2 (two) times daily.   ibuprofen 600 MG tablet Commonly known as:  ADVIL,MOTRIN Take 1 tablet (600 mg total) by mouth every 6 (six) hours as needed. What changed:  reasons to take this   montelukast 5 MG chewable tablet Commonly known as:  SINGULAIR CHEW 1 TABLET BY MOUTH AT BEDTIME   NASACORT ALLERGY 24HR 55 MCG/ACT Aero nasal inhaler Generic drug:  triamcinolone Place 2 sprays into the nose daily.   nicotine 21 mg/24hr patch Commonly known as:  NICODERM CQ - dosed in mg/24 hours Place 1 patch (21 mg total) onto the skin daily. Start taking on:  09/27/2016   nitroGLYCERIN 0.4 MG SL tablet Commonly known as:  NITROSTAT Place 0.4 mg under the tongue every 5 (five) minutes as needed for chest pain. x3 doses as needed for chest pain   pantoprazole 40 MG tablet Commonly known as:  PROTONIX Take 1 tablet (40 mg total) by mouth daily.   pramipexole 0.125 MG tablet Commonly known as:  MIRAPEX Take 1 tablet by mouth at bedtime.   predniSONE 10 MG tablet Commonly known as:  DELTASONE  x 3 days,  x 3 days, 20 x3 days  x 4 days, 5 x 4 days   rosuvastatin 20 MG tablet Commonly known as:  CRESTOR Take 20 mg by mouth at bedtime.   tiotropium 18 MCG inhalation capsule Commonly known as:  SPIRIVA Place 18 mcg into inhaler and inhale daily.            Durable Medical Equipment        Start     Ordered   09/26/16 1448  For home use only DME Nebulizer machine  Once    Question:  Patient needs a nebulizer to treat with the following condition  Answer:  COPD (chronic obstructive pulmonary disease) (HCC)    09/26/16 1448   09/26/16 1417  For home use only DME Other see comment  Once    Comments:  Evaluate for POC   09/26/16 1416   09/25/16 1443  For home use only DME oxygen  Once    Comments:  Patient was previously on nocturnal O2 but his COPD has advanced and he needs O2 continuously.  Please arrange for the lightest portable O2 tank possible for discharge > he will not be able to carry a silver rolling tank  Question Answer Comment  Mode or (Route) Nasal cannula   Liters per Minute 3   Frequency Continuous (stationary and portable oxygen unit needed)   Oxygen delivery system Other see comments      09/25/16 1443     Follow-up Information    Lawanda Cousins, MD Follow up on 11/23/2016.   Specialty:  Pulmonary Disease Why:  Appt at 10:30 AM Contact information: [address] [place] [address] [phone]        Rubye Oaks, NP Follow up on 10/09/2016.  Specialty:  Pulmonary Disease Why:  Appt at 11:15 AM for hospital follow up Contact information: [address] [place] [address] [phone]        Eartha Inch, MD. Schedule an appointment as soon as possible for a visit in 1 week(s).   Specialty:  Family Medicine Contact information: [address] [place] [address] [phone]          No Known Allergies  Consultations:  Pulmonary critical care medicine   Procedures/Studies: Ct Angio Chest Pe W Or Wo Contrast  Result Date: 09/22/2016 CLINICAL DATA:  Increased shortness of breath and chest pain since last night EXAM: CT ANGIOGRAPHY CHEST WITH CONTRAST TECHNIQUE: Multidetector CT imaging of the chest was performed using the standard protocol during bolus administration of intravenous contrast. Multiplanar CT image reconstructions and MIPs were obtained to evaluate the vascular anatomy. CONTRAST:  80 mL Isovue 370 COMPARISON:  09/17/2014, 07/21/2015, 01/26/2016 FINDINGS: Cardiovascular: Satisfactory opacification of the  pulmonary arteries to the segmental level. No evidence of pulmonary embolism. Normal heart size. No pericardial effusion. Normal caliber thoracic aorta. Mediastinum/Nodes: No enlarged mediastinal, hilar, or axillary lymph nodes. Thyroid gland, trachea, and esophagus demonstrate no significant findings. Lungs/Pleura: Severe centrilobular emphysema. No focal consolidation, pleural effusion or pneumothorax. Focal area of fibrosis in the right upper lobe measuring 2 cm with a small punctate calcification along the pleural surface. The area is unchanged compared with 01/26/2016. 6 x 12 mm spiculated pulmonary nodule at the right lung apex unchanged compared with 07/21/2015, but new compared with 09/17/2014. Upper Abdomen: No acute abnormality. Musculoskeletal: No acute osseous abnormality. Mild thoracic spine spondylosis. No lytic or sclerotic osseous lesion. Review of the MIP images confirms the above findings. IMPRESSION: 1. No pulmonary embolus. 2.  Emphysema. (ICD10-J43.9) 3. 6 x 12 mm spiculated pulmonary nodule at the right lung apex unchanged compared with 07/21/2015, but new compared with 09/17/2014. A follow-up chest CT in 6 months is recommended for this high risk patient. This recommendation follows the consensus statement: Guidelines for Management of Incidental Pulmonary Nodules Detected on CT Images:From the Fleischner Society 2017; published online before print (10.1148/[id number]). Electronically Signed   By: Elige Ko   On: 09/22/2016 09:03   Dg Chest Port 1 View  Result Date: 09/21/2016 CLINICAL DATA:  Dyspnea. Increased work of breathing. Duration 2 days. EXAM: PORTABLE CHEST 1 VIEW COMPARISON:  09/19/2016 FINDINGS: Unchanged hyperinflation. Extensive emphysematous disease. No airspace consolidation. No effusion. Normal vasculature. Normal heart size. Normal hilar and mediastinal contours. IMPRESSION: Emphysematous hyperinflation without evidence of superimposed consolidation or CHF.  Electronically Signed   By: Ellery Plunk M.D.   On: 09/21/2016 21:10   Dg Chest Portable 1 View  Result Date: 09/19/2016 CLINICAL DATA:  Extreme dyspnea x3 days with dry cough EXAM: PORTABLE CHEST 1 VIEW COMPARISON:  None. FINDINGS: Emphysematous hyperinflation of the lungs without pneumonic consolidation, effusion or pneumothorax. Calcified granulomata are seen in the upper lobes. Heart is normal in size. There is aortic atherosclerosis involving the arch. No overt pulmonary edema or pneumothorax. EKG leads and oxygen delivery device project over the thorax IMPRESSION: Emphysematous hyperinflation of the lungs similar to prior exam without acute pneumonic consolidation, pneumothorax or CHF. Calcified granulomata noted bilaterally. Electronically Signed   By: Tollie Eth M.D.   On: 09/19/2016 19:55      Subjective: Patient reports no increased dyspnea.  Discharge Exam: Vitals:   09/26/16 0721 09/26/16 1106  BP: (!) 163/84 (!) 173/69  Pulse: 71 85  Resp: Marland Kitchen)  22 (!) 32  Temp: 97.8 F (36.6 C) 97.4 F (36.3 C)   Vitals:   09/26/16 0721 09/26/16 0738 09/26/16 1106 09/26/16 1338  BP: (!) 163/84  (!) 173/69   Pulse: 71  85   Resp: (!) 22  (!) 32   Temp: 97.8 F (36.6 C)  97.4 F (36.3 C)   TempSrc: Oral  Oral   SpO2: 95% 97% 96% 94%  Weight:      Height:        General: Pt is alert, awake, not in acute distress Cardiovascular: RRR, S1/S2 +, no rubs, no gallops Respiratory: CTA bilaterally, no wheezing, no rhonchi Abdominal: Soft, NT, ND, bowel sounds + Extremities: no edema, no cyanosis    The results of significant diagnostics from this hospitalization (including imaging, microbiology, ancillary and laboratory) are listed below for reference.     Microbiology: Recent Results (from the past 240 hour(s))  MRSA PCR Screening     Status: None   Collection Time: 09/22/16  4:50 PM  Result Value Ref Range Status   MRSA by PCR NEGATIVE NEGATIVE Final    Comment:         The GeneXpert MRSA Assay (FDA approved for NASAL specimens only), is one component of a comprehensive MRSA colonization surveillance program. It is not intended to diagnose MRSA infection nor to guide or monitor treatment for MRSA infections.      Labs: BNP (last 3 results) No results for input(s): BNP in the last 8760 hours. Basic Metabolic Panel:  Recent Labs Lab 09/22/16 0348 09/23/16 0232 09/24/16 0227 09/25/16 0255 09/26/16 0433  NA 137 140 138 140 138  K 4.6 4.7 4.2 4.4 4.8  CL 97* 98* 99* 99* 99*  CO2 31 31 29 31 30   GLUCOSE 216* 198* 207* 220* 140*  BUN 25* 22* 26* 29* 29*  CREATININE 1.04 1.06 0.99 1.01 0.98  CALCIUM 9.2 9.2 8.9 9.0 9.3  MG  --  2.4 2.3 2.3 2.5*  PHOS  --  3.6 3.3 3.5 4.1   Liver Function Tests:  Recent Labs Lab 09/19/16 1915  AST 31  ALT 24  ALKPHOS 75  BILITOT 0.6  PROT 6.9  ALBUMIN 4.0   No results for input(s): LIPASE, AMYLASE in the last 168 hours. No results for input(s): AMMONIA in the last 168 hours. CBC:  Recent Labs Lab 09/19/16 1915 09/22/16 0348 09/23/16 0232 09/24/16 0227 09/25/16 0255 09/26/16 0433  WBC 7.2 14.0* 11.9* 12.0* 11.3* 13.6*  NEUTROABS 5.9  --   --   --   --   --   HGB 13.5 12.6* 12.0* 11.9* 12.4* 13.5  HCT 40.9 39.1 37.8* 37.3* 38.1* 41.3  MCV 85.7 87.5 87.3 86.7 86.6 85.2  PLT 182 235 214 208 209 246   Cardiac Enzymes:  Recent Labs Lab 09/19/16 2118 09/20/16 0449 09/20/16 0834  TROPONINI 0.25* 0.10* 0.11*   BNP: Invalid input(s): POCBNP CBG:  Recent Labs Lab 09/25/16 1238 09/25/16 1646 09/25/16 2139 09/26/16 0722 09/26/16 1107  GLUCAP 165* 185* 197* 152* 138*   D-Dimer No results for input(s): DDIMER in the last 72 hours. Hgb A1c No results for input(s): HGBA1C in the last 72 hours. Lipid Profile No results for input(s): CHOL, HDL, LDLCALC, TRIG, CHOLHDL, LDLDIRECT in the last 72 hours. Thyroid function studies No results for input(s): TSH, T4TOTAL, T3FREE, THYROIDAB  in the last 72 hours.  Invalid input(s): FREET3 Anemia work up No results for input(s): VITAMINB12, FOLATE, FERRITIN, TIBC, IRON, RETICCTPCT in  the last 72 hours. Urinalysis    Component Value Date/Time   COLORURINE YELLOW 08/08/2012 1910   APPEARANCEUR CLEAR 08/08/2012 1910   LABSPEC 1.005 08/08/2012 1910   PHURINE 7.0 08/08/2012 1910   GLUCOSEU NEGATIVE 08/08/2012 1910   HGBUR NEGATIVE 08/08/2012 1910   BILIRUBINUR NEGATIVE 08/08/2012 1910   KETONESUR NEGATIVE 08/08/2012 1910   PROTEINUR NEGATIVE 08/08/2012 1910   UROBILINOGEN 0.2 08/08/2012 1910   NITRITE NEGATIVE 08/08/2012 1910   LEUKOCYTESUR TRACE (A) 08/08/2012 1910   Sepsis Labs Invalid input(s): PROCALCITONIN,  WBC,  LACTICIDVEN Microbiology Recent Results (from the past 240 hour(s))  MRSA PCR Screening     Status: None   Collection Time: 09/22/16  4:50 PM  Result Value Ref Range Status   MRSA by PCR NEGATIVE NEGATIVE Final    Comment:        The GeneXpert MRSA Assay (FDA approved for NASAL specimens only), is one component of a comprehensive MRSA colonization surveillance program. It is not intended to diagnose MRSA infection nor to guide or monitor treatment for MRSA infections.      Time coordinating discharge: Over 30 minutes  SIGNED:   Jacquelin Hawking, MD [facility] 09/26/2016, 2:48 PM Pager [phone]  If 7PM-7AM, please contact night-coverage [email or web address] Password [email or web address]

## 2016-09-25 NOTE — Plan of Care (Signed)
Problem: Activity: Goal: Ability to tolerate increased activity will improve Outcome: Progressing Patient was able to maintain oxygen saturation at 94 on room air while at rest

## 2016-09-25 NOTE — Discharge Instructions (Signed)
Chronic Obstructive Pulmonary Disease Exacerbation  Chronic obstructive pulmonary disease (COPD) is a common lung problem. In COPD, the flow of air from the lungs is limited. COPD exacerbations are times that breathing gets worse and you need extra treatment. Without treatment they can be life threatening. If they happen often, your lungs can become more damaged. If your COPD gets worse, your doctor may treat you with:  ? Medicines.  ? Oxygen.  ? Different ways to clear your airway, such as using a mask.    Follow these instructions at home:  ? Do not smoke.  ? Avoid tobacco smoke and other things that bother your lungs.  ? If given, take your antibiotic medicine as told. Finish the medicine even if you start to feel better.  ? Only take medicines as told by your doctor.  ? Drink enough fluids to keep your pee (urine) clear or pale yellow (unless your doctor has told you not to).  ? Use a cool mist machine (vaporizer).  ? If you use oxygen or a machine that turns liquid medicine into a mist (nebulizer), continue to use them as told.  ? Keep up with shots (vaccinations) as told by your doctor.  ? Exercise regularly.  ? Eat healthy foods.  ? Keep all doctor visits as told.  Get help right away if:  ? You are very short of breath and it gets worse.  ? You have trouble talking.  ? You have bad chest pain.  ? You have blood in your spit (sputum).  ? You have a fever.  ? You keep throwing up (vomiting).  ? You feel weak, or you pass out (faint).  ? You feel confused.  ? You keep getting worse.  This information is not intended to replace advice given to you by your health care provider. Make sure you discuss any questions you have with your health care provider.  Document Released: 05/25/2011 Document Revised: 11/11/2015 Document Reviewed: 02/07/2013  Elsevier Interactive Patient Education ? 2017 Elsevier Inc.

## 2016-09-25 NOTE — Progress Notes (Signed)
PROGRESS NOTE    John Marsh  UJW:[id number] DOB: November 19, 1945 DOA: 09/19/2016 PCP: Eartha Inch, MD   Brief Narrative: John Marsh is a 71 y.o. malewith medical history significant for severe COPD with emphysema. Still smokes about 1PPD which is actually down from his historical 3PPD. He presented with a COPD exacerbation that was complicated by severe respiratory distress requiring BiPAP and transfer to the ICU. He has been weaned off of BiPAP and transferred to stepdown.   Assessment & Plan:   Principal Problem:   COPD exacerbation (HCC) Active Problems:   CAD (coronary artery disease), native coronary artery   COPD (chronic obstructive pulmonary disease) with emphysema (HCC)   Elevated troponin   Acute respiratory failure with hypoxia (HCC)   Protein-calorie malnutrition, severe   Severe COPD with exacerbation Improving. Off of BiPAP. -continue steroids -continue doxycycline -continue oxygen  Mildly elevated troponin Secondary to demand ischemia. Asymptomatic. EKG non-ACS pattern.  CAD Asymptomatic. History of PCI and stent. -continue aspirin -continue Plavix -continue Statin  Tobacco abuse Cutting down. Counseled. Nicotine patch at discharge.   DVT prophylaxis: Lovenox Code Status: Full code Family Communication: None at bedside Disposition Plan: Discharge in 1-2 days   Consultants:   Pulmonology  Procedures:   BiPAP  Antimicrobials:  Levaquin (4/6)  Doxycycine (4/6>>   Subjective: Patient reports no chest pain or dyspnea.  Objective: Vitals:   09/25/16 0316 09/25/16 0740 09/25/16 0751 09/25/16 1156  BP: 131/74  (!) 147/62 (!) 176/72  Pulse: 90  69 90  Resp:   18 (!) 26  Temp: 98.7 F (37.1 C)  97.8 F (36.6 C) 97.8 F (36.6 C)  TempSrc: Oral  Oral Oral  SpO2: 96% 95% 95% 97%  Weight:      Height:        Intake/Output Summary (Last 24 hours) at 09/25/16 1516 Last data filed at 09/25/16 1233  Gross per 24 hour  Intake               390 ml  Output             1775 ml  Net            -1385 ml   Filed Weights   09/21/16 0717 09/22/16 0359 09/24/16 0600  Weight: 69.4 kg (153 lb 1.6 oz) 71.5 kg (157 lb 9.6 oz) 71.7 kg (158 lb)    Examination:  General exam: Appears calm and comfortable Respiratory system: Clear to auscultation. Respiratory effort normal. Cardiovascular system: S1 & S2 heard, RRR. No murmurs. Gastrointestinal system: Abdomen is nondistended, soft and nontender. Normal bowel sounds heard. Central nervous system: Alert and oriented. No focal neurological deficits. Extremities: No edema. No calf tenderness Skin: No cyanosis. No rashes Psychiatry: Judgement and insight appear normal. Mood & affect appropriate.     Data Reviewed: I have personally reviewed following labs and imaging studies  CBC:  Recent Labs Lab 09/19/16 1915 09/22/16 0348 09/23/16 0232 09/24/16 0227 09/25/16 0255  WBC 7.2 14.0* 11.9* 12.0* 11.3*  NEUTROABS 5.9  --   --   --   --   HGB 13.5 12.6* 12.0* 11.9* 12.4*  HCT 40.9 39.1 37.8* 37.3* 38.1*  MCV 85.7 87.5 87.3 86.7 86.6  PLT 182 235 214 208 209   Basic Metabolic Panel:  Recent Labs Lab 09/19/16 1915 09/22/16 0348 09/23/16 0232 09/24/16 0227 09/25/16 0255  NA 140 137 140 138 140  K 3.8 4.6 4.7 4.2 4.4  CL 102 97* 98* 99* 99*  CO2 GLUCOSE 132* 216* 198* 207* 220*  BUN 10 25* 22* 26* 29*  CREATININE 0.95 1.04 1.06 0.99 1.01  CALCIUM 9.1 9.2 9.2 8.9 9.0  MG  --   --  2.4 2.3 2.3  PHOS  --   --  3.6 3.3 3.5   GFR: Estimated Creatinine Clearance: 67 mL/min (by C-G formula based on SCr of 1.01 mg/dL). Liver Function Tests:  Recent Labs Lab 09/19/16 1915  AST 31  ALT 24  ALKPHOS 75  BILITOT 0.6  PROT 6.9  ALBUMIN 4.0   No results for input(s): LIPASE, AMYLASE in the last 168 hours. No results for input(s): AMMONIA in the last 168 hours. Coagulation Profile: No results for input(s): INR, PROTIME in the last 168  hours. Cardiac Enzymes:  Recent Labs Lab 09/19/16 2118 09/20/16 0449 09/20/16 0834  TROPONINI 0.25* 0.10* 0.11*   BNP (last 3 results) No results for input(s): PROBNP in the last 8760 hours. HbA1C: No results for input(s): HGBA1C in the last 72 hours. CBG:  Recent Labs Lab 09/24/16 1234 09/24/16 1643 09/24/16 2040 09/25/16 0752 09/25/16 1238  GLUCAP 185* 213* 175* 163* 165*   Lipid Profile: No results for input(s): CHOL, HDL, LDLCALC, TRIG, CHOLHDL, LDLDIRECT in the last 72 hours. Thyroid Function Tests: No results for input(s): TSH, T4TOTAL, FREET4, T3FREE, THYROIDAB in the last 72 hours. Anemia Panel: No results for input(s): VITAMINB12, FOLATE, FERRITIN, TIBC, IRON, RETICCTPCT in the last 72 hours. Sepsis Labs:  Recent Labs Lab 09/19/16 1929  LATICACIDVEN 1.45    Recent Results (from the past 240 hour(s))  MRSA PCR Screening     Status: None   Collection Time: 09/22/16  4:50 PM  Result Value Ref Range Status   MRSA by PCR NEGATIVE NEGATIVE Final    Comment:        The GeneXpert MRSA Assay (FDA approved for NASAL specimens only), is one component of a comprehensive MRSA colonization surveillance program. It is not intended to diagnose MRSA infection nor to guide or monitor treatment for MRSA infections.          Radiology Studies: No results found.      Scheduled Meds: . arformoterol  15 mcg Nebulization BID  . aspirin EC  81 mg Oral Daily  . benzonatate  100 mg Oral TID  . budesonide (PULMICORT) nebulizer solution  0.5 mg Nebulization BID  . chlorhexidine  15 mL Mouth Rinse BID  . clopidogrel  75 mg Oral Daily  . doxycycline  100 mg Oral Q12H  . enoxaparin (LOVENOX) injection  40 mg Subcutaneous QHS  . feeding supplement (ENSURE ENLIVE)  237 mL Oral TID BM  . guaiFENesin  600 mg Oral BID  . insulin aspart  0-9 Units Subcutaneous TID WC  . mouth rinse  15 mL Mouth Rinse q12n4p  . montelukast  5 mg Oral QHS  . multivitamin with  minerals  1 tablet Oral Daily  . nicotine  21 mg Transdermal Daily  . pantoprazole  40 mg Oral Daily  . pramipexole  0.125 mg Oral QHS  . predniSONE  40 mg Oral Q breakfast  . rosuvastatin  20 mg Oral QHS  . triamcinolone  2 spray Nasal Daily   Continuous Infusions:   LOS: 6 days     Jacquelin Hawking, MD [facility] 09/25/2016, 3:16 PM Pager: [phone]  If 7PM-7AM, please contact night-coverage [email or web address] Password [email or web address] 09/25/2016, 3:16 PM

## 2016-09-25 NOTE — Progress Notes (Signed)
Name: John Marsh MRN: [id number] DOB: 1945/07/22    ADMISSION DATE:  09/19/2016 CONSULTATION DATE: 09/22/2016  REFERRING MD :  Dr. Jomarie Longs  CHIEF COMPLAINT:  Dyspnea  BRIEF PATIENT DESCRIPTION: 71 year old male, smoker, with known severe COPD on home oxygen admitted 4/3 for complaints of several days of chest pain and shortness of breath.  He was admitted for COPD exacerbation. He was treated with steroids and bronchodilators. On 4/06 he developed acute respiratory distress requiring BiPAP and was transferred to the ICU under PCCM care.  He improved and was transitioned back to SDU.    Of note he is followed by Dr. Jamison Neighbor in the pulmonary clinic. He has a known history of severe COPD and is on home oxygen.   SUBJECTIVE: Pt reports feeling better but still gets really winded with any activity.     VITAL SIGNS: Temp:  [97.6 F (36.4 C)-98.9 F (37.2 C)] 97.8 F (36.6 C) (04/09 1156) Pulse Rate:  [60-90] 90 (04/09 1156) Resp:  [15-27] 26 (04/09 1156) BP: (123-180)/(61-111) 176/72 (04/09 1156) SpO2:  [88 %-98 %] 97 % (04/09 1156)  PHYSICAL EXAMINATION: General:  Chronically ill appearing male in NAD HEENT: MM pink/moist, oropharynx clear, hoarse voice  Neuro: AAOx4, speech clear, MAE CV: s1s2 rrr, no m/r/g PULM: prolonged exp phase, diminished breath sounds, faint wheeze anterior DG:LOVF, non-tender, bsx4 active  Extremities: warm/dry, no edema  Skin: no rashes or lesions    Recent Labs Lab 09/23/16 0232 09/24/16 0227 09/25/16 0255  NA 140 138 140  K 4.7 4.2 4.4  CL 98* 99* 99*  CO2 BUN 22* 26* 29*  CREATININE 1.06 0.99 1.01  GLUCOSE 198* 207* 220*    Recent Labs Lab 09/23/16 0232 09/24/16 0227 09/25/16 0255  HGB 12.0* 11.9* 12.4*  HCT 37.8* 37.3* 38.1*  WBC 11.9* 12.0* 11.3*  PLT 214 208 209   No results found.  SIGNIFICANT EVENTS  4/03  admission for COPD exacerbation 4/09  Improved on SDU  STUDIES:  PFT  09/14/14 >> FVC 2.50 L (51%)  FEV1 1.15 L (32%) FEV1/FVC 0.46 FEF 25-75 0.33 L (12%) positive bronchodilator response TLC 9.10 L (120%) RV 236% DLCO uncorrected 36% CTA chest 4/6 >> No pulmonary embolus. Emphysema. 3. 6 x 12 mm spiculated pulmonary nodule at the right lung apex unchanged compared with 07/21/2015, but new compared with 09/17/2014. ECHO 4/5 >>  LVEF 55-60%, normal wall motion.     ASSESSMENT / PLAN:  Acute on chronic hypoxemic hypercarbic respiratory failure - secondary to acute exacerbation of COPD with Bronchitis. CTA chest negative for PE.   Plan: Brovana + Pulmicort BID Change steroids to PO for tonight and then daily to ensure no increase in bronchospasm.  Plan for 2 week slow taper to off  Duoneb PRN  Hold home Symbicort, Spiriva > can restart at discahrge  Continue home Singulair, Nasacort  Doxycycline, D4/7 PRN BiPAP > his is very claustrophobic and has difficulty wearing Smoking cessation counseling  Outpatient follow up arranged Assess ambulatory O2 needs with exertion  He will need a portable O2 tank delivered prior to discharge  Nocturnal hypoxemia Plan: Continue O2 to maintain sats >88% Baseline of 2.5L at bedtime    History of coronary artery disease: Demand ischemia on admission.  Continue Tele monitoring  Continue ASA, Plavix, Crestor   H/o pulmonary nodule - right apex spiculated pulmonary nodule, stabel on 09/22/16 from prior study Plan: Follow up CT imaging in 6 months   VTE  ppx: Lovenox Diet: HH Dispo:  SDU, TRH SVC   Canary Brim, NP-C [facility] Pgr: [phone] or if no answer [phone] 09/25/2016, 12:49 PM    ATTENDING NOTE / ATTESTATION NOTE :   I have discussed the case with the resident/APP Canary Brim NP.   I agree with the resident/APP's  history, physical examination, assessment, and plans.    I have edited the above note and modified it according to our agreed history, physical examination, assessment and plan.   Briefly, pt  with severe COPD being followed by Dr. Jamison Neighbor.  He is admitted with severe AECOPD 2/2 bronchitis requiring BiPaP and ICU.  He "hated" Bipap and has been weaned off over the weekend.  He is slowly improving, still with inc WOB.  VSS. 100% on 4L O2 at rest. Occasional accessory muscle use. Still with wheezing in BULF but better compared to over the weekend.  Rest of exam U/R and as above.   Assessment/Plan :  Severe AECOPD, improved - d/c IV steroids today (he got 80 mg IV already).  - start prednisone 40 mg today, then daily.  Plan for 2 week taper off prednisone.  - cont neb meds while admitted.  On d/c, switch back to symbicort and spiriva - on day of d/c, we need to do rest and ambulatory on o2 sats to determine how much o2 he will need.  We need to make sure we send order to [employer] so pt can get POC/o2 tanks. [employer] is his DME. He wants a small POC.  - finish 7 days total abx since admission - keep o2 sats > 88% - cont other meds.  - needs f/u with pulm (we scheduled already) - smoking cessation done - hopefully, d/c in 1-2 days.   Pt updated of plans.    Pollie Meyer, MD 09/25/2016, 3:01 PM [facility] Pager [phone] After 3 pm or if no answer, call [phone]

## 2016-09-26 LAB — BASIC METABOLIC PANEL
ANION GAP: 9 (ref 5–15)
BUN: 29 mg/dL — AB (ref 6–20)
CHLORIDE: 99 mmol/L — AB (ref 101–111)
CO2: 30 mmol/L (ref 22–32)
CREATININE: 0.98 mg/dL (ref 0.61–1.24)
Calcium: 9.3 mg/dL (ref 8.9–10.3)
GFR calc non Af Amer: >60 mL/min (ref >60)
Glucose, Bld: 140 mg/dL — ABNORMAL HIGH (ref 65–99)
POTASSIUM: 4.8 mmol/L (ref 3.5–5.1)
Sodium: 138 mmol/L (ref 135–145)

## 2016-09-26 LAB — GLUCOSE, CAPILLARY
Glucose-Capillary: 152 mg/dL — ABNORMAL HIGH (ref 65–99)
Glucose-Capillary: 138 mg/dL — ABNORMAL HIGH (ref 65–99)

## 2016-09-26 LAB — PHOSPHORUS: PHOSPHORUS: 4.1 mg/dL (ref 2.5–4.6)

## 2016-09-26 LAB — CBC
HEMATOCRIT: 41.3 % (ref 39.0–52.0)
HEMOGLOBIN: 13.5 g/dL (ref 13.0–17.0)
MCH: 27.8 pg (ref 26.0–34.0)
MCHC: 32.7 g/dL (ref 30.0–36.0)
MCV: 85.2 fL (ref 78.0–100.0)
Platelets: 246 10*3/uL (ref 150–400)
RBC: 4.85 MIL/uL (ref 4.22–5.81)
RDW: 12.7 % (ref 11.5–15.5)
WBC: 13.6 10*3/uL — AB (ref 4.0–10.5)

## 2016-09-26 LAB — MAGNESIUM: Magnesium: 2.5 mg/dL — ABNORMAL HIGH (ref 1.7–2.4)

## 2016-09-26 MED ORDER — FLUTICASONE PROPIONATE 50 MCG/ACT NA SUSP: 2.0000 | Freq: Every day | NASAL | 0 refills | Status: AC | PRN

## 2016-09-26 MED ORDER — ALBUTEROL SULFATE (2.5 MG/3ML) 0.083% IN NEBU: 2.5000 mg | INHALATION_SOLUTION | RESPIRATORY_TRACT | 12 refills | Status: DC | PRN

## 2016-09-26 MED ORDER — GUAIFENESIN ER 600 MG PO TB12: 600.0000 mg | ORAL_TABLET | Freq: Two times a day (BID) | ORAL | 0 refills | Status: DC

## 2016-09-26 MED ORDER — BENZONATATE 100 MG PO CAPS
100.0000 mg | ORAL_CAPSULE | Freq: Three times a day (TID) | ORAL | 0 refills | Status: DC
Start: 2016-09-26 — End: 2017-05-01

## 2016-09-26 MED ORDER — NICOTINE 21 MG/24HR TD PT24: 21.0000 mg | MEDICATED_PATCH | Freq: Every day | TRANSDERMAL | 0 refills | Status: DC

## 2016-09-26 MED ORDER — DOXYCYCLINE HYCLATE 100 MG PO TABS: 100.0000 mg | ORAL_TABLET | Freq: Two times a day (BID) | ORAL | 0 refills | Status: AC

## 2016-09-26 MED ORDER — PREDNISONE 10 MG PO TABS: ORAL_TABLET | ORAL | 0 refills | Status: DC

## 2016-09-26 MED ORDER — ENSURE ENLIVE PO LIQD: 237.0000 mL | Freq: Three times a day (TID) | ORAL | 0 refills | Status: AC

## 2016-09-26 NOTE — Care Management Note (Signed)
Case Management Note  Patient Details  Name: FINLAY GODBEE MRN: [id number] Date of Birth: Jun 07, 1946  Subjective/Objective:     From home, has oxygen with [facility], but only uses at night, will need to be on 3 liters continuously now, NCM faxed saturations , oxygen orders and face sheet.  Also gave patient the order for eval for poc from MD, so that he can be evaluated for small portable oxygen at home.  Patient is for dc today.  Per rep at [facility] , it will take 2 -3 weeks before he would be able to get a small portable oxygen.  He will need to use the bigger tanks until he is able to get the smaller one and qualify for it.               Action/Plan:   Expected Discharge Date:  09/21/16               Expected Discharge Plan:  Home/Self Care  In-House Referral:     Discharge planning Services  CM Consult  Post Acute Care Choice:  Durable Medical Equipment Choice offered to:  Patient  DME Arranged:  Oxygen DME Agency:  [facility]  HH Arranged:    HH Agency:     Status of Service:  Completed, signed off  If discussed at Long Length of Stay Meetings, dates discussed:    Additional Comments:  Leone Haven, RN 09/26/2016, 2:27 PM

## 2016-09-26 NOTE — Progress Notes (Signed)
Name: John Marsh MRN: [id number] DOB: 1946/01/24    ADMISSION DATE:  09/19/2016 CONSULTATION DATE: 09/22/2016  REFERRING MD :  Dr. Jomarie Longs  CHIEF COMPLAINT:  Dyspnea  BRIEF PATIENT DESCRIPTION: 71 year old male, smoker, with known severe COPD on home oxygen admitted 4/3 for complaints of several days of chest pain and shortness of breath.  He was admitted for COPD exacerbation. He was treated with steroids and bronchodilators. On 4/06 he developed acute respiratory distress requiring BiPAP and was transferred to the ICU under PCCM care.  He improved and was transitioned back to SDU.    Of note he is followed by Dr. Jamison Neighbor in the pulmonary clinic. He has a known history of severe COPD and is on home oxygen.   SUBJECTIVE: Feels better, continues to feel exertional dyspnea. On home oxygen at 2 L. States he is not quite at baseline, but close   VITAL SIGNS: Temp:  [97.3 F (36.3 C)-97.8 F (36.6 C)] 97.8 F (36.6 C) (04/10 0721) Pulse Rate:  [70-90] 71 (04/10 0721) Resp:  [20-26] 22 (04/10 0721) BP: (144-176)/(69-91) 163/84 (04/10 0721) SpO2:  [93 %-100 %] 97 % (04/10 0738)  PHYSICAL EXAMINATION: General:  Chronically ill appearing male in NAD, sitting on side of bed HEENT: MM pink/moist, oropharynx clear, hoarse voice, no JVD Neuro: AAOx4, speech clear, MAE x 4 CV: s1s2 rrr, no m/r/g PULM: prolonged exp phase, diminished breath sounds per bases, faint exp.wheeze  ZD:GLOV, non-tender, bsx4 active  Extremities: warm/dry, no edema  Skin: no rashes or lesions, some bruising noted    Recent Labs Lab 09/24/16 0227 09/25/16 0255 09/26/16 0433  NA 138 140 138  K 4.2 4.4 4.8  CL 99* 99* 99*  CO2 BUN 26* 29* 29*  CREATININE 0.99 1.01 0.98  GLUCOSE 207* 220* 140*    Recent Labs Lab 09/24/16 0227 09/25/16 0255 09/26/16 0433  HGB 11.9* 12.4* 13.5  HCT 37.3* 38.1* 41.3  WBC 12.0* 11.3* 13.6*  PLT 208 209 246   No results found.  SIGNIFICANT EVENTS  4/03   admission for COPD exacerbation 4/09  Improved on SDU  STUDIES:  PFT  09/14/14 >> FVC 2.50 L (51%) FEV1 1.15 L (32%) FEV1/FVC 0.46 FEF 25-75 0.33 L (12%) positive bronchodilator response TLC 9.10 L (120%) RV 236% DLCO uncorrected 36% CTA chest 4/6 >> No pulmonary embolus. Emphysema. 3. 6 x 12 mm spiculated pulmonary nodule at the right lung apex unchanged compared with 07/21/2015, but new compared with 09/17/2014. ECHO 4/5 >>  LVEF 55-60%, normal wall motion.     ASSESSMENT / PLAN:  Acute on chronic hypoxemic hypercarbic respiratory failure - secondary to acute exacerbation of COPD with Bronchitis. CTA chest negative for PE.  - Weaned to 2 L with sats of 94% - Faint exp. wheeze  Severe AECOPD, improved - IV steroids d/c 4/9  (he got 80 mg IV already).  - prednisone 40 mg started 4/9, then daily.  Plan for 2 week taper off prednisone.  - cont neb meds while admitted.  On d/c, switch back to symbicort and spiriva - on day of d/c, we need to do rest and ambulatory on o2 sats to determine how much o2 he will need.He states he is currently on oxygen @ 2.5 L at home.  We need to make sure we send order to [facility] so pt can get POC/o2 tanks. [employer] is his DME. He wants a small POC.  - finish 7 days total abx ( [facility])  since admission - keep o2 sats > 88% - cont other meds.  - Follow up appointment 4/23 at 11:15 with Rubye Oaks, NP - smoking cessation reinforced again today - hopeful to d/c 4/10 pm.     Nocturnal hypoxemia Plan: Continue O2 to maintain sats >88% Baseline of 2.5L at bedtime    History of coronary artery disease: Demand ischemia on admission.  Continue Tele monitoring  Continue ASA, Plavix, Crestor   H/o pulmonary nodule - right apex spiculated pulmonary nodule, stabel on 09/22/16 from prior study Plan: Follow up CT imaging in 6 months Follow up to be arranged as outpatient through pulmonary   Pt updated regarding plan, and follow up appointment date and time.     Bevelyn Ngo, AGACNP-BC [facility] Pager # [phone] 09/26/2016, 11:01 AM   ATTENDING NOTE / ATTESTATION NOTE :   I have discussed the case with the resident/APP  Kandice Robinsons  I agree with the resident/APP's  history, physical examination, assessment, and plans.    I have edited the above note and modified it according to our agreed history, physical examination, assessment and plan.   Patient admitted for severe acute exacerbation of COPD. Required BiPAP which has been weaned off prior to the weekend. He is slowly improving. Patient seen, comfortable, at baseline. On his oxygen, 3-4 liters. Less wheezing today. Plan to be discharged today. Resume home medications with COPD. He will need oxygen continuously. We will set him up with his DME company for him to get a portable oxygen concentrator. He will need prolonged prednisone wean, at least 2 weeks. He will need a follow-up with our office within the week or so. Finish abx for bronchitis.     Pollie Meyer, MD 09/26/2016, 3:21 PM [facility] Pager [phone] After 3 pm or if no answer, call [phone]

## 2016-09-26 NOTE — Progress Notes (Signed)
SATURATION QUALIFICATIONS: (This note is used to comply with regulatory documentation for home oxygen)  Patient Saturations on Room Air at Rest = 88%  Patient Saturations on Room Air while Ambulating = 86%  Patient Saturations on 2.5 Liters of oxygen while Ambulating = 94%  Please briefly explain why patient needs home oxygen: Patient SOB on exertion/ambulation in hallway.

## 2016-09-26 NOTE — Progress Notes (Signed)
Patient being discharged home. Patient/wife/son given discharged instructions. Patient/wife/son given appt dates and times. Patient educated on new medications. Patient/wife/son verbalized understanding.

## 2016-09-29 ENCOUNTER — Telehealth: Payer: Self-pay | Admitting: Pulmonary Disease

## 2016-09-29 DIAGNOSIS — J438 Other emphysema: Secondary | ICD-10-CM

## 2016-09-29 MED ORDER — ALBUTEROL SULFATE (2.5 MG/3ML) 0.083% IN NEBU: 2.5000 mg | INHALATION_SOLUTION | RESPIRATORY_TRACT | 11 refills | Status: DC | PRN

## 2016-09-29 NOTE — Telephone Encounter (Signed)
Called and spoke with pt and he is aware of meds that have been sent to the pharmacy.  Nothing further is needed.  

## 2016-09-29 NOTE — Telephone Encounter (Signed)
Pt uses [facility] in [place] on [address]. Please let pt and or wife know when this has been called in and please be sure to include a diag code w/this or pharm will not fill.Caren Griffins

## 2016-10-06 ENCOUNTER — Telehealth: Payer: Self-pay | Admitting: Pulmonary Disease

## 2016-10-06 NOTE — Telephone Encounter (Signed)
A prescription refill request was sent to [facility] at [phone] for Montelukast  chew tablets. The order has quantity of 30 and 6 refills with directions to chew and swallow 1 tablet by mouth every night at bedtime. A conformation fax was received. Nothing further is needed.

## 2016-10-09 ENCOUNTER — Inpatient Hospital Stay: Payer: Medicare Other | Admitting: Adult Health

## 2016-11-23 ENCOUNTER — Encounter: Payer: Self-pay | Admitting: Pulmonary Disease

## 2016-11-23 ENCOUNTER — Ambulatory Visit (INDEPENDENT_AMBULATORY_CARE_PROVIDER_SITE_OTHER): Payer: Medicare Other | Admitting: Pulmonary Disease

## 2016-11-23 VITALS — BP 150/70 | HR 87 | Ht 72.0 in | Wt 168.6 lb

## 2016-11-23 DIAGNOSIS — F17200 Nicotine dependence, unspecified, uncomplicated: Secondary | ICD-10-CM

## 2016-11-23 DIAGNOSIS — R911 Solitary pulmonary nodule: Secondary | ICD-10-CM

## 2016-11-23 DIAGNOSIS — I70223 Atherosclerosis of native arteries of extremities with rest pain, bilateral legs: Secondary | ICD-10-CM | POA: Diagnosis not present

## 2016-11-23 DIAGNOSIS — IMO0001 Reserved for inherently not codable concepts without codable children: Secondary | ICD-10-CM

## 2016-11-23 DIAGNOSIS — J9621 Acute and chronic respiratory failure with hypoxia: Secondary | ICD-10-CM

## 2016-11-23 DIAGNOSIS — F17210 Nicotine dependence, cigarettes, uncomplicated: Secondary | ICD-10-CM

## 2016-11-23 DIAGNOSIS — J449 Chronic obstructive pulmonary disease, unspecified: Secondary | ICD-10-CM

## 2016-11-23 NOTE — Patient Instructions (Addendum)
   Continue using your oxygen and inhalers as prescribed.  Call me if you have any new breathing problems or questions before your next appointment.  I will see you back after your CT scan with a walk that same day.  TESTS ORDERED: 1. CT CHEST W/O August 2018 2. 6MWT with oxygen titration at next appointment

## 2016-11-23 NOTE — Progress Notes (Signed)
Subjective:    Patient ID: John Marsh, male    DOB: 1945-08-12, 71 y.o.   MRN: [id number]  C.C.:  Follow-up for Very Severe COPD w/ Emphysema, Bilateral Lung Nodules, Nocturnal Hypoxia, Chronic Seasonal Allergic Rhinitis, & Tobacco Use Disorder.  HPI Very severe COPD with emphysema: At last appointment continuing to participate in maintenance program with pulmonary rehabilitation. Prescribed Symbicort and Spiriva. Patient hospitalized in April with acute exacerbation of COPD and discharged with order for portable oxygen concentrator. He reports he feels he has improved some but he still has significant dyspnea on exertion. He is using his rescue inhaler 2-4 times daily. He reports minimal coughing & wheezing.  Bilateral lung nodules: Initially found to have round glass nodule right upper lobe March 2016 with imaging in August 2016 showing multiple new subcentimeter groundglass nodules bilaterally. Also with known calcified lung nodules and splenic as well as mediastinal lymph node calcifications. Previously resided in [place] and [place]. Patient underwent CT angiogram of the chest in April which did show a right upper lobe nodule at the time of his last exacerbation.  Nocturnal hypoxia: Prescribed oxygen at 2 L/m while sleeping. Patient discharged with order for portable concentrator after last hospitalization. He reports he is using oxygen at 3 L/m with exertion but does increase to 4 L/m at times. Reports he was assessed for a portable concentrator and approved but it hasn't arrived yet.   Chronic seasonal allergic rhinitis: Previously prescribed Singulair and Nasacort.  Tobacco use disorder: At last appointment patient was continuing to use cigarettes at a rate of 2-3 cigarettes per week. At last hospitalization in April patient was reportedly smoking one pack per day. He reports he has had 3 cigarettes since his discharge. He has been using a nicotine patch.   Review of Systems No chest  tightness, pressure, or pain. No fever or chills. No abdominal pain or nausea.   No Known Allergies  Current Outpatient Prescriptions on File Prior to Visit  Medication Sig Dispense Refill  . albuterol (PROVENTIL HFA;VENTOLIN HFA) 108 (90 BASE) MCG/ACT inhaler Inhale 2 puffs into the lungs every 6 (six) hours as needed for wheezing or shortness of breath. Normally takes 2 puffs every morning and takes daily as needed for rescue thereafter    . albuterol (PROVENTIL) (2.5 MG/3ML) 0.083% nebulizer solution Take 3 mLs (2.5 mg total) by nebulization every 4 (four) hours as needed for wheezing or shortness of breath. 540 mL 11  . aspirin EC 81 MG tablet Take 81 mg by mouth daily.    . benzonatate (TESSALON) 100 MG capsule Take 1 capsule (100 mg total) by mouth 3 (three) times daily. 20 capsule 0  . budesonide-formoterol (SYMBICORT) 160-4.5 MCG/ACT inhaler Inhale 2 puffs into the lungs 2 (two) times daily.    . clopidogrel (PLAVIX) 75 MG tablet Take 75 mg by mouth daily.    . feeding supplement, ENSURE ENLIVE, (ENSURE ENLIVE) LIQD Take 237 mLs by mouth 3 (three) times daily between meals. 30 Bottle 0  . fluticasone (FLONASE) 50 MCG/ACT nasal spray Place 2 sprays into both nostrils daily as needed for allergies or rhinitis. 16 g 0  . guaiFENesin (MUCINEX) 600 MG 12 hr tablet Take 1 tablet (600 mg total) by mouth 2 (two) times daily. 30 tablet 0  . ibuprofen (ADVIL,MOTRIN) 600 MG tablet Take 1 tablet (600 mg total) by mouth every 6 (six) hours as needed. (Patient taking differently: Take 600 mg by mouth every 6 (six) hours as needed for  moderate pain. ) 30 tablet 0  . montelukast (SINGULAIR) 5 MG chewable tablet CHEW 1 TABLET BY MOUTH AT BEDTIME 30 tablet 3  . nicotine (NICODERM CQ - DOSED IN MG/24 HOURS) 21 mg/24hr patch Place 1 patch (21 mg total) onto the skin daily. 28 patch 0  . nitroGLYCERIN (NITROSTAT) 0.4 MG SL tablet Place 0.4 mg under the tongue every 5 (five) minutes as needed for chest pain. x3  doses as needed for chest pain    . pantoprazole (PROTONIX) 40 MG tablet Take 1 tablet (40 mg total) by mouth daily. 30 tablet 0  . pramipexole (MIRAPEX) 0.125 MG tablet Take 1 tablet by mouth at bedtime.    . rosuvastatin (CRESTOR) 20 MG tablet Take 20 mg by mouth at bedtime.    Marland Kitchen Spacer/Aero-Holding Chambers (AEROCHAMBER Z-STAT PLUS) inhaler Use as instructed 1 each 0  . tiotropium (SPIRIVA) 18 MCG inhalation capsule Place 18 mcg into inhaler and inhale daily.    Marland Kitchen triamcinolone (NASACORT ALLERGY 24HR) 55 MCG/ACT AERO nasal inhaler Place 2 sprays into the nose daily.    . predniSONE (DELTASONE) 10 MG tablet 40mg  x 3 days, 30mg  x 3 days, 20 x3 days 10mg  x 4 days, 5 x 4 days (Patient not taking: Reported on 11/23/2016) 33 tablet 0   No current facility-administered medications on file prior to visit.    Past Medical History:  Diagnosis Date  . COPD (chronic obstructive pulmonary disease) (HCC)   . Coronary heart disease   . Emphysema of lung (HCC)   . Hyperlipidemia   . Hypertension   . MI (myocardial infarction) (HCC)   . Multiple lung nodules on CT   . Nocturnal hypoxia   . Peripheral arterial disease (HCC)   . Shortness of breath dyspnea    Past Surgical History:  Procedure Laterality Date  . CHOLECYSTECTOMY    . NASAL SEPTUM SURGERY    . PERIPHERAL VASCULAR CATHETERIZATION N/A 07/27/2015   Procedure: Abdominal Aortogram;  Surgeon: Chuck Hint, MD;  Location: [facility] INVASIVE CV LAB;  Service: Cardiovascular;  Laterality: N/A;  . PERIPHERAL VASCULAR CATHETERIZATION Bilateral 07/27/2015   Procedure: Lower Extremity Angiography;  Surgeon: Chuck Hint, MD;  Location: [facility] INVASIVE CV LAB;  Service: Cardiovascular;  Laterality: Bilateral;  . stent placed     Family History  Problem Relation Age of Onset  . Heart disease Mother        died at 41  . Heart disease Father   . Heart disease Brother   . Heart disease Sister   . Lung disease Neg Hx    Social History    Social History  . Marital status: Married    Spouse name: N/A  . Number of children: N/A  . Years of education: N/A   Occupational History  . retired    Social History Main Topics  . Smoking status: Current Every Day Smoker    Packs/day: 0.50    Years: 55.00    Types: Cigarettes    Start date: 08/01/1959  . Smokeless tobacco: Never Used     Comment: 1/2 ppd 06/03/15 - peak 3ppd   taking Chantix currently 07-20-2015  . Alcohol use No  . Drug use: No  . Sexual activity: No   Other Topics Concern  . None   Social History Narrative   Originally from [place]. Previously has lived in [place]. He moved to [facility] in 1964. He serve in [place]. 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 He has been to [place], [place],  [place], several countries in [place], countries in [place], [place], & multiple [place]. 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 He has a dog, 7 cats, & 2 horses. Remote exposure to a parrot for a few years in a different home. No mold exposure. No hot tub exposure. 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       Objective:   Physical Exam BP (!) 150/70 (BP Location: Left Arm, Patient Position: Sitting, Cuff Size: Normal)   Pulse 87   Ht 6' (1.829 m)   Wt 168 lb 9.6 oz (76.5 kg)   SpO2 98%   BMI 22.87 kg/m   General:  Awake. Alert. No distress. Thin, Caucasian male.  Integument:  Warm & dry. No rash on exposed skin.  Extremities:  No cyanosis or clubbing.  HEENT:  Moist mucus membranes. No oral ulcers. Moderate bilateral nasal. Swelling with pale mucosa.  Cardiovascular:  Regular rate and rhythm. No edema.  Pulmonary:  Symmetrically decreased aeration. Normal work of breathing on room air. Speaking in complete sentences Abdomen: Soft. Normal bowel sounds. Nondistended.  Musculoskeletal:  Normal bulk and tone. No joint effusion appreciated.   PFT  05/30/16:  FVC 2.46 L (51%) FEV1 1.06 L (30%) FEV1/FVC 0.43 FEF 25-75 0.3 L (14%) positive bronchodilator response 06/03/15: FVC 3.07 L (63%) FEV1 1.20 L (33%) FEV1/FVC 0.39 FEF 25-75 0.47 L (17%) negative bronchodilator response 09/14/14: FVC 2.50 L (51%) FEV1 1.15 L (32%) FEV1/FVC 0.46 FEF 25-75 0.33 L (12%) positive bronchodilator response TLC 9.10 L (120%) RV 236% DLCO uncorrected 36%  11/23/16:  Only able to complete 1 lap w/ lowest saturation 97% 05/30/16:  Walked 399 meters / Baseline Sat 97% on RA / Nadir Sat 95% on RA 06/03/15:  Walked 336 meters / Baseline Sat 97% on RA / Nadir Sat 97% on RA  OVERNIGHT PULSE OX 03/09/15:Performed on RA. 9:12:32 analyzed. Spent 10:32 with saturation </= 88%. 08/18/14: Performed on room air & 7 hours 56 minutes analyzed. Lowest saturation 83%. 14.3 minutes with saturation </= 88%. 73 desaturations events. Lowest pulse 56 bpm.  IMAGING  CTA CHEST 09/22/16 (personally reviewed by me):  No pulmonary embolism. Apical predominant emphysematous changes noted. Right upper lobe nodule measuring up to 1.2 cm with some groundglass component on my review and spiculation likely due to distortion from emphysema. No other nodules appreciated. No pleural effusion or thickening. No pericardial effusion. No pathologic mediastinal adenopathy.  CT CHEST W/O 01/26/16 (previously reviewed by me): Moderate to severe upper lobe predominant emphysematous changes. Saber-sheath trachea. No consolidation. Calcified nodules bilaterally. Upper lobe predominant nodules are essentially unchanged with largest measuring 1.2 x 0.5 cm and right upper lobe. Cavitary 0.9 cm peripheral right upper lobe nodule unchanged going back to 2016. No pathologic mediastinal adenopathy. No pleural effusion.  CT CHEST W/O 10/20/15 (previously reviewed by me): 1.1 x 0.5 cm spiculated nodule right upper lobe distorted by surrounding emphysema. Adjacent 9 mm nodule as well. No new developing pulmonary nodules. Calcified  nodules again noted as well as small amount of calcification with mediastinal lymph nodes. Apical predominant emphysematous changes. No pleural effusion or thickening. No pericardial effusion. No pathologic mediastinal adenopathy.  CT CHEST W/O 01/19/15 (previously reviewed by me): No pleural effusion or thickening appreciated. Scattered bilateral subcentimeter groundglass pulmonary nodules. Additional calcified pulmonary nodules consistent with evidence of prior granulomatous disease given mediastinal  lymph node with calcification & calcification within spleen. Radiologist commented on 1.2 x 1.2 irregular groundglass focus in the right upper lobe which is significantly distorted by severe upper lobe predominant emphysema. No pathologic mediastinal adenopathy. Radiologist commented that there are no bruits subcentimeter nodules that are new. Indeed these nodules were not present on my review of CT imaging from 2005 & 2006. The first time these were found was March 2016.  LABS 03/04/15 Alpha-1 antitrypsin:  MM (205) IgE:  21 RAST Panel:  Aspergillus Fumgitagus 0.36 (class 1) / Alternaria alternata 0.27 (class 0/1) / helminthosporium halodes 0.30 (class 0/1)  08/08/12 CBC: 6.5/15.0/42.5/171 BMP: 139/3.6/99/29/9/0.89/144/9.3   Assessment & Plan:  71 y.o. male with very severe COPD with emphysema, right upper lobe nodule, acute on chronic hypoxic respiratory failure, chronic seasonal allergic rhinitis, & tobacco use disorder. Patient seems to be recovering well from his COPD exacerbation. I reviewed his chest CT scan which does show a right upper lobe nodule. We will need to continue to follow this nodule given his history of tobacco use. Patient did not desaturate significantly but was only able to walk one lap given the severity of his underlying COPD. We will continue the patient on oxygen therapy until his COPD recovers such that we can accurately assess his oxygen requirement with exertion.  1. Very  severe COPD with emphysema:  Continuing on Symbicort, Spiriva, and Singulair. Holding off on pulmonary rehabilitation. 2. Right upper lobe nodule:  Repeat CT chest without contrast August 2018. 3. Acute on chronic hypoxic respiratory failure:  Repeat 6 minute walk test with oxygen titration next appointment. Awaiting delivery of portable oxygen concentrator. Continuing on oxygen at 2 L/m with exertion.   4. Chronic seasonal allergic rhinitis:  Continuing Nasacort and Singulair as prescribed. 5. Tobacco use disorder:  Patient counseled for over 3 minutes today for complete tobacco cessation. Continuing to use nicotine patch. 6. Health maintenance: Status post Pravigard December 2016, influenza vaccine 2017, & Pneumovax this year. 7. Follow-up: Return to clinic in 2 months or sooner if needed.  Donna ChristenJennings E. Jamison NeighborNestor, M.D. [facility] Pager:  [phone] After 3pm or if no response, call [phone] 10:50 AM 11/23/16

## 2016-11-23 NOTE — Addendum Note (Signed)
Addended by: Pamalee LeydenWIGGINS, Chyanna Flock J on: 11/23/2016 11:23 AM   Modules accepted: Orders

## 2016-11-23 NOTE — Addendum Note (Signed)
Addended by: Pamalee LeydenWIGGINS, Evania Lyne J on: 11/23/2016 11:20 AM   Modules accepted: Orders

## 2017-01-17 ENCOUNTER — Ambulatory Visit (HOSPITAL_COMMUNITY): Admission: RE | Admit: 2017-01-17 | Payer: Medicare Other | Source: Ambulatory Visit

## 2017-01-18 ENCOUNTER — Ambulatory Visit (HOSPITAL_COMMUNITY): Admission: RE | Admit: 2017-01-18 | Payer: Medicare Other | Source: Ambulatory Visit

## 2017-01-25 ENCOUNTER — Ambulatory Visit (HOSPITAL_COMMUNITY): Admission: RE | Admit: 2017-01-25 | Payer: Medicare Other | Source: Ambulatory Visit

## 2017-02-01 ENCOUNTER — Ambulatory Visit (HOSPITAL_COMMUNITY)
Admission: RE | Admit: 2017-02-01 | Discharge: 2017-02-01 | Disposition: A | Discharge status: Home / Self Care | Payer: Medicare Other | Source: Ambulatory Visit | Attending: Pulmonary Disease | Admitting: Pulmonary Disease

## 2017-02-01 DIAGNOSIS — R911 Solitary pulmonary nodule: Secondary | ICD-10-CM | POA: Diagnosis present

## 2017-02-01 DIAGNOSIS — I2510 Atherosclerotic heart disease of native coronary artery without angina pectoris: Secondary | ICD-10-CM | POA: Diagnosis not present

## 2017-02-01 DIAGNOSIS — I700 Atherosclerosis of aorta: Secondary | ICD-10-CM | POA: Diagnosis not present

## 2017-02-01 DIAGNOSIS — R918 Other nonspecific abnormal finding of lung field: Secondary | ICD-10-CM | POA: Diagnosis not present

## 2017-02-01 DIAGNOSIS — J439 Emphysema, unspecified: Secondary | ICD-10-CM | POA: Diagnosis not present

## 2017-02-01 DIAGNOSIS — IMO0001 Reserved for inherently not codable concepts without codable children: Secondary | ICD-10-CM

## 2017-02-05 ENCOUNTER — Other Ambulatory Visit: Payer: Self-pay | Admitting: Pulmonary Disease

## 2017-02-05 DIAGNOSIS — R05 Cough: Secondary | ICD-10-CM

## 2017-02-05 DIAGNOSIS — R059 Cough, unspecified: Secondary | ICD-10-CM

## 2017-02-05 MED ORDER — AMOXICILLIN-POT CLAVULANATE 875-125 MG PO TABS: 1.0000 | ORAL_TABLET | Freq: Two times a day (BID) | ORAL | 0 refills | Status: DC

## 2017-02-05 MED ORDER — ALBUTEROL SULFATE HFA 108 (90 BASE) MCG/ACT IN AERS
2.0000 | INHALATION_SPRAY | Freq: Four times a day (QID) | RESPIRATORY_TRACT | 6 refills | Status: AC | PRN
Start: 2017-02-05 — End: ?

## 2017-02-06 ENCOUNTER — Telehealth: Payer: Self-pay | Admitting: Pulmonary Disease

## 2017-02-06 NOTE — Telephone Encounter (Signed)
Spoke with the patient today. He reports his cough is slightly worse than baseline with increased mucus production. He denies any fever, chills or sweats & his mucus is not excessively discolored. He attributes dyspnea more to pain & discomfort in his legs. I made him aware of his CT scan results. I also advised him we will need to get a culture and start him on Augmentin. I advised him to let me know if his cough didn't significantly improve or if he had any problems with the antibiotic.   Please perform the following:  1. Rx for his Albuterol inhaler - 1 inhaler - 6 refills  2. Augmentin 875mg  po bid - 28 pills - no refills  3. Sputum Culture for AFB, Fungus, & Routine - To be done at [facility] (also can he pick up the specimen at the lab there).  -----------------  Spoke with pt, aware of results/recs.  Nothing further needed.

## 2017-02-13 ENCOUNTER — Ambulatory Visit (INDEPENDENT_AMBULATORY_CARE_PROVIDER_SITE_OTHER): Payer: Medicare Other | Admitting: *Deleted

## 2017-02-13 ENCOUNTER — Ambulatory Visit (INDEPENDENT_AMBULATORY_CARE_PROVIDER_SITE_OTHER): Payer: Medicare Other | Admitting: Pulmonary Disease

## 2017-02-13 ENCOUNTER — Encounter: Payer: Self-pay | Admitting: Pulmonary Disease

## 2017-02-13 ENCOUNTER — Telehealth: Payer: Self-pay | Admitting: Pulmonary Disease

## 2017-02-13 VITALS — BP 176/70 | HR 86 | Ht 72.0 in | Wt 178.0 lb

## 2017-02-13 DIAGNOSIS — F17210 Nicotine dependence, cigarettes, uncomplicated: Secondary | ICD-10-CM | POA: Diagnosis not present

## 2017-02-13 DIAGNOSIS — F17200 Nicotine dependence, unspecified, uncomplicated: Secondary | ICD-10-CM

## 2017-02-13 DIAGNOSIS — J449 Chronic obstructive pulmonary disease, unspecified: Secondary | ICD-10-CM

## 2017-02-13 DIAGNOSIS — J9611 Chronic respiratory failure with hypoxia: Secondary | ICD-10-CM

## 2017-02-13 DIAGNOSIS — R918 Other nonspecific abnormal finding of lung field: Secondary | ICD-10-CM | POA: Diagnosis not present

## 2017-02-13 DIAGNOSIS — I70223 Atherosclerosis of native arteries of extremities with rest pain, bilateral legs: Secondary | ICD-10-CM | POA: Diagnosis not present

## 2017-02-13 MED ORDER — FORMOTEROL FUMARATE 20 MCG/2ML IN NEBU: 20.0000 ug | INHALATION_SOLUTION | Freq: Two times a day (BID) | RESPIRATORY_TRACT | 3 refills | Status: DC

## 2017-02-13 MED ORDER — BUDESONIDE 0.5 MG/2ML IN SUSP: 0.5000 mg | Freq: Two times a day (BID) | RESPIRATORY_TRACT | 3 refills | Status: DC

## 2017-02-13 NOTE — Patient Instructions (Signed)
   Continue using your rescue inhaler, Spiriva inhaler, and albuterol in your nebulizer as well as your other medications as prescribed.  I'm prescribing you Pulmicort & Perforomist to use in your nebulizer in place of Symbicort. If you feel your breathing is getting worse on these medications go back to the Symbicort and let me know.  Remember to remove any dentures or partials you have before you use the Pulmicort. Remember to brush your teeth & tongue after you use it as well as rinse, gargle & spit to keep from getting thrush in your mouth or on your tongue (a white film).   Call me if you have any questions or concerns before your next appointment.  TESTS ORDERED: 1. CT CHEST W/O 3 months

## 2017-02-13 NOTE — Progress Notes (Signed)
Subjective:    Patient ID: John Marsh, male    DOB: April 15, 1946, 71 y.o.   MRN: [id number]  C.C.:  Follow-up for Very Severe COPD w/ Emphysema, Right Upper Lobe Nodule, Chronic Hypoxic Respiratory Failure, Chronic Seasonal Allergic Rhinitis, & Tobacco Use Disorder.  HPI Very severe COPD with emphysema: Since last appointment treated for cough with Augmentin. Participating in pulmonary rehabilitation. Prescribed Spiriva and Symbicort. He reports his cough has improved significantly. He has been producing less mucus. He reports sporadic wheezing. He is still using his rescue inhaler 4-5 times daily.   Right upper lobe nodule: Initially found to have a groundglass nodule in right upper lobe March 2016 with August 2016 imaging showing new subcentimeter groundglass nodules bilaterally. Also with calcified lung nodules and splenic calcifications as well as mediastinal lymph node calcifications consistent with prior granulomatous exposure with residents in [place] and [place]. Most recent CT scan shows new nodular opacities suggestive of an infectious etiology. Prescribed Augmentin on 8/21.  Chronic hypoxic respiratory failure: Prescribed oxygen at 2 L/m of sleeping. Awaiting portable concentrator at last appointment which was prescribed after hospitalization. Previously using oxygen at 3 L/m with exertion but increasing to 4 L/m at times. Patient's walk test doesn't show any oxygen requirement with ambulation today.  Chronic seasonal allergic rhinitis: Prescribed Singulair and Nasacort. He reports improved sinus congestion & pressure. Minimal sinus drainage.   Tobacco use disorder: At last appointment patient was using a nicotine patch and had only smoked 3 cigarettes since his discharge from hospital in April. He is sneaking a cigarette once daily per wife & once weekly.   Review of Systems No chest pain or tightness. No fever or chills. No abdominal pain or nausea.   No Known Allergies  Current  Outpatient Prescriptions on File Prior to Visit  Medication Sig Dispense Refill  . albuterol (PROVENTIL HFA;VENTOLIN HFA) 108 (90 Base) MCG/ACT inhaler Inhale 2 puffs into the lungs every 6 (six) hours as needed for wheezing or shortness of breath. 1 Inhaler 6  . albuterol (PROVENTIL) (2.5 MG/3ML) 0.083% nebulizer solution Take 3 mLs (2.5 mg total) by nebulization every 4 (four) hours as needed for wheezing or shortness of breath. 540 mL 11  . amoxicillin-clavulanate (AUGMENTIN) 875-125 MG tablet Take 1 tablet by mouth 2 (two) times daily. 28 tablet 0  . aspirin EC 81 MG tablet Take 81 mg by mouth daily.    . benzonatate (TESSALON) 100 MG capsule Take 1 capsule (100 mg total) by mouth 3 (three) times daily. 20 capsule 0  . budesonide-formoterol (SYMBICORT) 160-4.5 MCG/ACT inhaler Inhale 2 puffs into the lungs 2 (two) times daily.    . clopidogrel (PLAVIX) 75 MG tablet Take 75 mg by mouth daily.    . feeding supplement, ENSURE ENLIVE, (ENSURE ENLIVE) LIQD Take 237 mLs by mouth 3 (three) times daily between meals. 30 Bottle 0  . fluticasone (FLONASE) 50 MCG/ACT nasal spray Place 2 sprays into both nostrils daily as needed for allergies or rhinitis. 16 g 0  . guaiFENesin (MUCINEX) 600 MG 12 hr tablet Take 1 tablet (600 mg total) by mouth 2 (two) times daily. 30 tablet 0  . ibuprofen (ADVIL,MOTRIN) 600 MG tablet Take 1 tablet (600 mg total) by mouth every 6 (six) hours as needed. (Patient taking differently: Take 600 mg by mouth every 6 (six) hours as needed for moderate pain. ) 30 tablet 0  . montelukast (SINGULAIR) 5 MG chewable tablet CHEW 1 TABLET BY MOUTH AT BEDTIME 30 tablet  3  . nicotine (NICODERM CQ - DOSED IN MG/24 HOURS) 21 mg/24hr patch Place 1 patch (21 mg total) onto the skin daily. 28 patch 0  . nitroGLYCERIN (NITROSTAT) 0.4 MG SL tablet Place 0.4 mg under the tongue every 5 (five) minutes as needed for chest pain. x3 doses as needed for chest pain    . pantoprazole (PROTONIX) 40 MG tablet  Take 1 tablet (40 mg total) by mouth daily. 30 tablet 0  . pramipexole (MIRAPEX) 0.125 MG tablet Take 1 tablet by mouth at bedtime.    . rosuvastatin (CRESTOR) 20 MG tablet Take 20 mg by mouth at bedtime.    Marland Kitchen Spacer/Aero-Holding Chambers (AEROCHAMBER Z-STAT PLUS) inhaler Use as instructed 1 each 0  . tiotropium (SPIRIVA) 18 MCG inhalation capsule Place 18 mcg into inhaler and inhale daily.    Marland Kitchen triamcinolone (NASACORT ALLERGY 24HR) 55 MCG/ACT AERO nasal inhaler Place 2 sprays into the nose daily.     No current facility-administered medications on file prior to visit.    Past Medical History:  Diagnosis Date  . COPD (chronic obstructive pulmonary disease) (HCC)   . Coronary heart disease   . Emphysema of lung (HCC)   . Hyperlipidemia   . Hypertension   . MI (myocardial infarction) (HCC)   . Multiple lung nodules on CT   . Nocturnal hypoxia   . Peripheral arterial disease (HCC)   . Shortness of breath dyspnea    Past Surgical History:  Procedure Laterality Date  . CHOLECYSTECTOMY    . NASAL SEPTUM SURGERY    . PERIPHERAL VASCULAR CATHETERIZATION N/A 07/27/2015   Procedure: Abdominal Aortogram;  Surgeon: Chuck Hint, MD;  Location: [facility] INVASIVE CV LAB;  Service: Cardiovascular;  Laterality: N/A;  . PERIPHERAL VASCULAR CATHETERIZATION Bilateral 07/27/2015   Procedure: Lower Extremity Angiography;  Surgeon: Chuck Hint, MD;  Location: [facility] INVASIVE CV LAB;  Service: Cardiovascular;  Laterality: Bilateral;  . stent placed     Family History  Problem Relation Age of Onset  . Heart disease Mother        died at 54  . Heart disease Father   . Heart disease Brother   . Heart disease Sister   . Lung disease Neg Hx    Social History   Social History  . Marital status: Married    Spouse name: N/A  . Number of children: N/A  . Years of education: N/A   Occupational History  . retired    Social History Main Topics  . Smoking status: Current Every Day Smoker     Packs/day: 0.50    Years: 55.00    Types: Cigarettes    Start date: 08/01/1959  . Smokeless tobacco: Never Used     Comment: 1/2 ppd 06/03/15 - peak 3ppd   taking Chantix currently 07-20-2015  . Alcohol use No  . Drug use: No  . Sexual activity: No   Other Topics Concern  . None   Social History Narrative   Originally from [place]. Previously has lived in [place]. He moved to [facility] in [place]. He serve in [place]. 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 He has been to [place], [place], [place], several countries in [place], countries in [place], [place], & multiple [place]ries. 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 He has a dog, 7 cats, & 2 horses. Remote exposure to a parrot for a few years in  a different home. No mold exposure. No hot tub exposure. 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       Objective:   Physical Exam BP (!) 176/70 (BP Location: Left Arm, Cuff Size: Normal)   Pulse 86   Ht 6' (1.829 m)   Wt 178 lb (80.7 kg)   SpO2 96%   BMI 24.14 kg/m   General:  Awake. Alert. No acute distress. Wife with patient today. Integument:  Warm & dry. No rash on exposed skin.  Lymphatics: No appreciated cervical or supraclavicular lymphadenopathy. HEENT:  Moist mucus membranes. No nasal turbinate swelling. No oral ulcers. Cardiovascular:  Regular rate. No edema. Unable to appreciate JVD.  Pulmonary:  Symmetrically decreased breath sounds. Otherwise clear to auscultation. Normal work of breathing. Abdomen: Soft. Normal bowel sounds. Nondistended.  Musculoskeletal:  Normal bulk and tone. No joint effusion appreciated.  PFT  05/30/16: FVC 2.46 L (51%) FEV1 1.06 L (30%) FEV1/FVC 0.43 FEF 25-75 0.3 L (14%) positive bronchodilator response 06/03/15: FVC 3.07 L (63%) FEV1 1.20 L (33%) FEV1/FVC 0.39 FEF 25-75 0.47 L (17%) negative bronchodilator response 09/14/14: FVC 2.50 L (51%)  FEV1 1.15 L (32%) FEV1/FVC 0.46 FEF 25-75 0.33 L (12%) positive bronchodilator response TLC 9.10 L (120%) RV 236% DLCO uncorrected 36%  02/13/17:  Walked 207 meters / Baseline Sat 100% on RA / Nadir Sat 98% on RA 11/23/16:  Only able to complete 1 lap w/ lowest saturation 97% 05/30/16:  Walked 399 meters / Baseline Sat 97% on RA / Nadir Sat 95% on RA 06/03/15:  Walked 336 meters / Baseline Sat 97% on RA / Nadir Sat 97% on RA  OVERNIGHT PULSE OX 03/09/15:Performed on RA. 9:12:32 analyzed. Spent 10:32 with saturation </= 88%. 08/18/14: Performed on room air & 7 hours 56 minutes analyzed. Lowest saturation 83%. 14.3 minutes with saturation </= 88%. 73 desaturations events. Lowest pulse 56 bpm.  IMAGING  CT CHEST W/O 02/01/17 (personally reviewed by me):  Bilateral nodules with nodular opacities within right upper lobe likely distorted by underlying apical predominant emphysema. Difficult to discern whether or not these nodules are progressive. No pleural effusion or thickening. No pathologic mediastinal adenopathy. No pericardial effusion.  CTA CHEST 09/22/16 (previously reviewed by me):  No pulmonary embolism. Apical predominant emphysematous changes noted. Right upper lobe nodule measuring up to 1.2 cm with some groundglass component on my review and spiculation likely due to distortion from emphysema. No other nodules appreciated. No pleural effusion or thickening. No pericardial effusion. No pathologic mediastinal adenopathy.  CT CHEST W/O 01/26/16 (previously reviewed by me): Moderate to severe upper lobe predominant emphysematous changes. Saber-sheath trachea. No consolidation. Calcified nodules bilaterally. Upper lobe predominant nodules are essentially unchanged with largest measuring 1.2 x 0.5 cm and right upper lobe. Cavitary 0.9 cm peripheral right upper lobe nodule unchanged going back to 2016. No pathologic mediastinal adenopathy. No pleural effusion.  CT CHEST W/O 10/20/15 (previously  reviewed by me): 1.1 x 0.5 cm spiculated nodule right upper lobe distorted by surrounding emphysema. Adjacent 9 mm nodule as well. No new developing pulmonary nodules. Calcified nodules again noted as well as small amount of calcification with mediastinal lymph nodes. Apical predominant emphysematous changes. No pleural effusion or thickening. No pericardial effusion. No pathologic mediastinal adenopathy.  CT CHEST W/O 01/19/15 (previously reviewed by me): No pleural effusion or thickening appreciated. Scattered bilateral subcentimeter groundglass pulmonary nodules. Additional calcified pulmonary nodules consistent with evidence of prior granulomatous disease given mediastinal lymph node  with calcification & calcification within spleen. Radiologist commented on 1.2 x 1.2 irregular groundglass focus in the right upper lobe which is significantly distorted by severe upper lobe predominant emphysema. No pathologic mediastinal adenopathy. Radiologist commented that there are no bruits subcentimeter nodules that are new. Indeed these nodules were not present on my review of CT imaging from 2005 & 2006. The first time these were found was March 2016.  LABS 03/04/15 Alpha-1 antitrypsin:  MM (205) IgE:  21 RAST Panel:  Aspergillus Fumgitagus 0.36 (class 1) / Alternaria alternata 0.27 (class 0/1) / helminthosporium halodes 0.30 (class 0/1)  08/08/12 CBC: 6.5/15.0/42.5/171 BMP: 139/3.6/99/29/9/0.89/144/9.3   Assessment & Plan:  71 y.o. male with underlying very severe COPD with emphysema. Patient's walk test today demonstrates no significant desaturation or oxygen requirement. We discussed the patient's physical limitations to exertion are likely related to the severity of his underlying lung disease as well as his peripheral vascular disease. We did discuss his tobacco use which seems to be more social at this time. I do feel the nodular opacities on his imaging are secondary to a likely underlying  inflammatory/infectious process and given his symptomatic improvement he will continue on his current antibiotic treatment. With his frequent use of rescue medication I do feel that switching to a nebulized regimen may be necessary to improve drug delivery and control of his symptoms. I instructed the patient to contact me if he had any questions or concerns before his next appointment.  1. Very severe COPD with emphysema: Continuing patient on Spiriva. Switching from Symbicort to Pulmicort 0.5 mg & Perforomist nebulized twice daily. Continuing albuterol rescue medications. 2. Nodular opacities: Repeat CT chest without contrast in 3 months or sooner if symptoms do not continue to improve. Completing course of Augmentin and follow awaiting sputum culture results. 3. Chronic hypoxic respiratory failure: No evidence of oxygen requirement today. Reportedly qualifying for portable oxygen concentrator through [facility]. 4. Chronic seasonal allergic rhinitis: Continue patient on Nasacort and Singulair. No changes. 5. Tobacco use disorder: Counseled for over 3 minutes and need for complete tobacco cessation. 6. Health maintenance: Status post Prevnar vaccine December 2016 & Pneumovax 2018. 7. Follow-up: Return to clinic in 3 months or sooner if needed.  Donna Christen Jamison Neighbor, M.D. [facility] Pager:  [phone] After 3pm or if no response, call [phone] 9:48 AM 02/13/17

## 2017-02-13 NOTE — Progress Notes (Signed)
SIX MIN WALK 02/13/2017 11/23/2016 05/30/2016 06/03/2015 02/09/2015 09/17/2014 08/10/2014  Medications asa 81mg , symbicort 160 (2 puffs), plavix 75mg , mucinex 600mg , protnix 40mg , spiriva , nasacort (1 spray each nare).   - Aspirin-8:30am, Symbicort-8:30, Plavix-8:30 ventolin hfa 2 puffs taken at 8 am POST TEST PRETEST -  Supplimental Oxygen during Test? (L/min) No No No No No No No  Laps 4 - 8 7 5 4  -  Partial Lap (in Meters) 15 - 15 0 0.75 0 -  Baseline BP (sitting) 142/60 - 158/78 140/62 150/70 108/68 -  Baseline Heartrate 85 - 78 72 98 71 -  Baseline Dyspnea (Borg Scale) 2 - 1 0 6 7 -  Baseline Fatigue (Borg Scale) 2 - 1 0 6 6 -  Baseline SPO2 100 - 97 97 95 98 -  BP (sitting) 166/66 - 152/74 140/78 158/68 148/70 -  Heartrate 95 - 87 89 112 94 -  Dyspnea (Borg Scale) 5 - 4 2 7 14  -  Fatigue (Borg Scale) 6 - 4 2 7 15  -  SPO2 98 - 95 97 94 96 -  BP (sitting) 152/64 - 152/70 138/70 140/72 118/64 -  Heartrate 87 - 80 70 92 73 -  SPO2 100 - 98 99 98 99 -  Stopped or Paused before Six Minutes No - No No No Yes -  Other Symptoms at end of Exercise - - - - - Patient was fatigued and SOB -  Interpretation Hip pain;Leg pain;Calf pain - Leg pain;Calf pain Leg pain - Angina -  Distance Completed 207 - 399 336 240.75 192 -  Tech Comments: pt walked a slow pace, notable dyspnea throughout entirety of walk.  no O2 desat during duration of test.   Pt walked at slow pace with steady gait. He was unable to complete walk as he stated his leg hurt and is short of breath.  pt. walked at a steady brisk pace. Did not appear to have any trouble walking. At rest during his 2 min post, expressed having some leg and calf pain.  - Patient was able to walk entire 6 min without pushing wheelchair assistant device. Patient did not complain of chest pain, SOB, or any other abnormal s/s. Patient was able to walk for 5 min 30 sec while pushing the wheelchair assistant device. Patient needed to stop due fatigue and SOB.  Patient stated towards the end of orientation that he experienced some chest pain rated a 3 on a scale of 1 to 10. Patient stated that pain subsided during recovery. Patients vitals were taken and were WNL. Patient did not complain of any other abnormal s/s. -  Provider Comments: - - - - Patient walked 5.75 laps = 1168ft = 2.93mph = 2.64 METs Patient walked 4 laps = 828ft = 1.47mph = 2.17 METs -

## 2017-02-13 NOTE — Telephone Encounter (Signed)
Spoke with Fleet Contras at [employer] of Dx Codes of  J44.9 and J96.11. Nothing more needed at this time.

## 2017-03-07 ENCOUNTER — Other Ambulatory Visit: Payer: Self-pay

## 2017-03-07 MED ORDER — BUDESONIDE 0.5 MG/2ML IN SUSP: 0.5000 mg | Freq: Two times a day (BID) | RESPIRATORY_TRACT | 3 refills | Status: AC

## 2017-05-01 ENCOUNTER — Inpatient Hospital Stay (HOSPITAL_COMMUNITY): Payer: Medicare Other

## 2017-05-01 ENCOUNTER — Encounter (HOSPITAL_COMMUNITY): Payer: Self-pay | Admitting: Emergency Medicine

## 2017-05-01 ENCOUNTER — Inpatient Hospital Stay (HOSPITAL_COMMUNITY)
Admission: EM | Admit: 2017-05-01 | Discharge: 2017-05-06 | DRG: 871 | Disposition: A | Discharge status: Home / Self Care | Payer: Medicare Other | Attending: Internal Medicine | Admitting: Internal Medicine

## 2017-05-01 ENCOUNTER — Other Ambulatory Visit: Payer: Self-pay

## 2017-05-01 ENCOUNTER — Emergency Department (HOSPITAL_COMMUNITY): Payer: Medicare Other

## 2017-05-01 DIAGNOSIS — I739 Peripheral vascular disease, unspecified: Secondary | ICD-10-CM | POA: Diagnosis present

## 2017-05-01 DIAGNOSIS — I25118 Atherosclerotic heart disease of native coronary artery with other forms of angina pectoris: Secondary | ICD-10-CM | POA: Diagnosis not present

## 2017-05-01 DIAGNOSIS — R197 Diarrhea, unspecified: Secondary | ICD-10-CM | POA: Diagnosis not present

## 2017-05-01 DIAGNOSIS — J181 Lobar pneumonia, unspecified organism: Secondary | ICD-10-CM | POA: Diagnosis not present

## 2017-05-01 DIAGNOSIS — R600 Localized edema: Secondary | ICD-10-CM | POA: Diagnosis present

## 2017-05-01 DIAGNOSIS — K219 Gastro-esophageal reflux disease without esophagitis: Secondary | ICD-10-CM | POA: Diagnosis present

## 2017-05-01 DIAGNOSIS — F17210 Nicotine dependence, cigarettes, uncomplicated: Secondary | ICD-10-CM | POA: Diagnosis present

## 2017-05-01 DIAGNOSIS — G8929 Other chronic pain: Secondary | ICD-10-CM | POA: Diagnosis present

## 2017-05-01 DIAGNOSIS — T8089XA Other complications following infusion, transfusion and therapeutic injection, initial encounter: Secondary | ICD-10-CM | POA: Diagnosis not present

## 2017-05-01 DIAGNOSIS — D509 Iron deficiency anemia, unspecified: Secondary | ICD-10-CM | POA: Diagnosis present

## 2017-05-01 DIAGNOSIS — Z791 Long term (current) use of non-steroidal anti-inflammatories (NSAID): Secondary | ICD-10-CM

## 2017-05-01 DIAGNOSIS — Z9981 Dependence on supplemental oxygen: Secondary | ICD-10-CM | POA: Diagnosis not present

## 2017-05-01 DIAGNOSIS — E785 Hyperlipidemia, unspecified: Secondary | ICD-10-CM | POA: Diagnosis not present

## 2017-05-01 DIAGNOSIS — M79606 Pain in leg, unspecified: Secondary | ICD-10-CM | POA: Diagnosis not present

## 2017-05-01 DIAGNOSIS — I10 Essential (primary) hypertension: Secondary | ICD-10-CM | POA: Diagnosis present

## 2017-05-01 DIAGNOSIS — Z9049 Acquired absence of other specified parts of digestive tract: Secondary | ICD-10-CM

## 2017-05-01 DIAGNOSIS — J471 Bronchiectasis with (acute) exacerbation: Secondary | ICD-10-CM | POA: Diagnosis not present

## 2017-05-01 DIAGNOSIS — R6520 Severe sepsis without septic shock: Secondary | ICD-10-CM | POA: Diagnosis present

## 2017-05-01 DIAGNOSIS — I248 Other forms of acute ischemic heart disease: Secondary | ICD-10-CM | POA: Diagnosis present

## 2017-05-01 DIAGNOSIS — M549 Dorsalgia, unspecified: Secondary | ICD-10-CM | POA: Diagnosis present

## 2017-05-01 DIAGNOSIS — Y92230 Patient room in hospital as the place of occurrence of the external cause: Secondary | ICD-10-CM | POA: Diagnosis not present

## 2017-05-01 DIAGNOSIS — J441 Chronic obstructive pulmonary disease with (acute) exacerbation: Secondary | ICD-10-CM | POA: Diagnosis not present

## 2017-05-01 DIAGNOSIS — I2510 Atherosclerotic heart disease of native coronary artery without angina pectoris: Secondary | ICD-10-CM | POA: Diagnosis present

## 2017-05-01 DIAGNOSIS — I252 Old myocardial infarction: Secondary | ICD-10-CM

## 2017-05-01 DIAGNOSIS — J309 Allergic rhinitis, unspecified: Secondary | ICD-10-CM | POA: Diagnosis not present

## 2017-05-01 DIAGNOSIS — E781 Pure hyperglyceridemia: Secondary | ICD-10-CM | POA: Diagnosis present

## 2017-05-01 DIAGNOSIS — J9621 Acute and chronic respiratory failure with hypoxia: Secondary | ICD-10-CM | POA: Diagnosis not present

## 2017-05-01 DIAGNOSIS — R000 Tachycardia, unspecified: Secondary | ICD-10-CM | POA: Diagnosis present

## 2017-05-01 DIAGNOSIS — I700 Atherosclerosis of aorta: Secondary | ICD-10-CM | POA: Diagnosis present

## 2017-05-01 DIAGNOSIS — I5032 Chronic diastolic (congestive) heart failure: Secondary | ICD-10-CM | POA: Diagnosis not present

## 2017-05-01 DIAGNOSIS — R911 Solitary pulmonary nodule: Secondary | ICD-10-CM | POA: Diagnosis present

## 2017-05-01 DIAGNOSIS — J189 Pneumonia, unspecified organism: Secondary | ICD-10-CM

## 2017-05-01 DIAGNOSIS — E43 Unspecified severe protein-calorie malnutrition: Secondary | ICD-10-CM | POA: Diagnosis present

## 2017-05-01 DIAGNOSIS — D649 Anemia, unspecified: Secondary | ICD-10-CM | POA: Diagnosis present

## 2017-05-01 DIAGNOSIS — I483 Typical atrial flutter: Secondary | ICD-10-CM | POA: Diagnosis present

## 2017-05-01 DIAGNOSIS — Y848 Other medical procedures as the cause of abnormal reaction of the patient, or of later complication, without mention of misadventure at the time of the procedure: Secondary | ICD-10-CM | POA: Diagnosis not present

## 2017-05-01 DIAGNOSIS — J439 Emphysema, unspecified: Secondary | ICD-10-CM | POA: Diagnosis present

## 2017-05-01 DIAGNOSIS — R0602 Shortness of breath: Secondary | ICD-10-CM

## 2017-05-01 DIAGNOSIS — I440 Atrioventricular block, first degree: Secondary | ICD-10-CM | POA: Diagnosis present

## 2017-05-01 DIAGNOSIS — Z79899 Other long term (current) drug therapy: Secondary | ICD-10-CM

## 2017-05-01 DIAGNOSIS — J9690 Respiratory failure, unspecified, unspecified whether with hypoxia or hypercapnia: Secondary | ICD-10-CM | POA: Diagnosis present

## 2017-05-01 DIAGNOSIS — I480 Paroxysmal atrial fibrillation: Secondary | ICD-10-CM | POA: Diagnosis present

## 2017-05-01 DIAGNOSIS — Z7982 Long term (current) use of aspirin: Secondary | ICD-10-CM

## 2017-05-01 DIAGNOSIS — J209 Acute bronchitis, unspecified: Secondary | ICD-10-CM | POA: Diagnosis present

## 2017-05-01 DIAGNOSIS — Z8249 Family history of ischemic heart disease and other diseases of the circulatory system: Secondary | ICD-10-CM | POA: Diagnosis not present

## 2017-05-01 DIAGNOSIS — A419 Sepsis, unspecified organism: Principal | ICD-10-CM | POA: Diagnosis present

## 2017-05-01 DIAGNOSIS — Z7902 Long term (current) use of antithrombotics/antiplatelets: Secondary | ICD-10-CM

## 2017-05-01 DIAGNOSIS — Z6823 Body mass index (BMI) 23.0-23.9, adult: Secondary | ICD-10-CM

## 2017-05-01 DIAGNOSIS — E782 Mixed hyperlipidemia: Secondary | ICD-10-CM | POA: Diagnosis not present

## 2017-05-01 DIAGNOSIS — Z7951 Long term (current) use of inhaled steroids: Secondary | ICD-10-CM

## 2017-05-01 LAB — RESPIRATORY PANEL BY PCR
ADENOVIRUS-RVPPCR: NOT DETECTED
Bordetella pertussis: NOT DETECTED
CORONAVIRUS 229E-RVPPCR: NOT DETECTED
CORONAVIRUS NL63-RVPPCR: NOT DETECTED
CORONAVIRUS OC43-RVPPCR: NOT DETECTED
Chlamydophila pneumoniae: NOT DETECTED
Coronavirus HKU1: NOT DETECTED
INFLUENZA A-RVPPCR: NOT DETECTED
INFLUENZA B-RVPPCR: NOT DETECTED
METAPNEUMOVIRUS-RVPPCR: NOT DETECTED
MYCOPLASMA PNEUMONIAE-RVPPCR: NOT DETECTED
PARAINFLUENZA VIRUS 1-RVPPCR: NOT DETECTED
PARAINFLUENZA VIRUS 2-RVPPCR: NOT DETECTED
PARAINFLUENZA VIRUS 4-RVPPCR: NOT DETECTED
Parainfluenza Virus 3: NOT DETECTED
RESPIRATORY SYNCYTIAL VIRUS-RVPPCR: NOT DETECTED
Rhinovirus / Enterovirus: NOT DETECTED

## 2017-05-01 LAB — I-STAT ARTERIAL BLOOD GAS, ED
Acid-Base Excess: 3 mmol/L — ABNORMAL HIGH (ref 0.0–2.0)
BICARBONATE: 28.4 mmol/L — AB (ref 20.0–28.0)
O2 Saturation: 97 %
PCO2 ART: 45.3 mmHg (ref 32.0–48.0)
PH ART: 7.406 (ref 7.350–7.450)
TCO2: 30 mmol/L (ref 22–32)
pO2, Arterial: 92 mmHg (ref 83.0–108.0)

## 2017-05-01 LAB — PATHOLOGIST SMEAR REVIEW

## 2017-05-01 LAB — CBC WITH DIFFERENTIAL/PLATELET
Basophils Absolute: 0.1 10*3/uL (ref 0.0–0.1)
Basophils Relative: 1 %
EOS PCT: 3 %
Eosinophils Absolute: 0.2 10*3/uL (ref 0.0–0.7)
HEMATOCRIT: 29.2 % — AB (ref 39.0–52.0)
Hemoglobin: 8.2 g/dL — ABNORMAL LOW (ref 13.0–17.0)
LYMPHS ABS: 1.2 10*3/uL (ref 0.7–4.0)
Lymphocytes Relative: 15 %
MCH: 20.6 pg — ABNORMAL LOW (ref 26.0–34.0)
MCHC: 28.1 g/dL — AB (ref 30.0–36.0)
MCV: 73.4 fL — AB (ref 78.0–100.0)
MONOS PCT: 6 %
Monocytes Absolute: 0.5 10*3/uL (ref 0.1–1.0)
NEUTROS ABS: 5.7 10*3/uL (ref 1.7–7.7)
Neutrophils Relative %: 75 %
Platelets: 232 10*3/uL (ref 150–400)
RBC: 3.98 MIL/uL — ABNORMAL LOW (ref 4.22–5.81)
RDW: 16.6 % — AB (ref 11.5–15.5)
WBC: 7.7 10*3/uL (ref 4.0–10.5)

## 2017-05-01 LAB — BASIC METABOLIC PANEL
Anion gap: 8 (ref 5–15)
BUN: 21 mg/dL — AB (ref 6–20)
CO2: 29 mmol/L (ref 22–32)
CREATININE: 1.07 mg/dL (ref 0.61–1.24)
Calcium: 8.7 mg/dL — ABNORMAL LOW (ref 8.9–10.3)
Chloride: 99 mmol/L — ABNORMAL LOW (ref 101–111)
GFR calc non Af Amer: >60 mL/min (ref >60)
GLUCOSE: 305 mg/dL — AB (ref 65–99)
Potassium: 3.8 mmol/L (ref 3.5–5.1)
Sodium: 136 mmol/L (ref 135–145)

## 2017-05-01 LAB — PROTIME-INR
INR: 1.02
PROTHROMBIN TIME: 13.3 s (ref 11.4–15.2)

## 2017-05-01 LAB — I-STAT TROPONIN, ED: TROPONIN I, POC: 0.02 ng/mL (ref 0.00–0.08)

## 2017-05-01 LAB — INFLUENZA PANEL BY PCR (TYPE A & B)
Influenza A By PCR: NEGATIVE
Influenza B By PCR: NEGATIVE

## 2017-05-01 LAB — POC OCCULT BLOOD, ED: Fecal Occult Bld: NEGATIVE

## 2017-05-01 LAB — TROPONIN I
TROPONIN I: 0.03 ng/mL — AB (ref <0.03)
TROPONIN I: 0.03 ng/mL — AB (ref <0.03)

## 2017-05-01 LAB — STREP PNEUMONIAE URINARY ANTIGEN: Strep Pneumo Urinary Antigen: NEGATIVE

## 2017-05-01 LAB — IRON AND TIBC
IRON: 10 ug/dL — AB (ref 45–182)
Saturation Ratios: 2 % — ABNORMAL LOW (ref 17.9–39.5)
TIBC: 490 ug/dL — ABNORMAL HIGH (ref 250–450)
UIBC: 480 ug/dL

## 2017-05-01 LAB — SAVE SMEAR

## 2017-05-01 LAB — RETICULOCYTES
RBC.: 3.76 MIL/uL — AB (ref 4.22–5.81)
RETIC COUNT ABSOLUTE: 71.4 10*3/uL (ref 19.0–186.0)
Retic Ct Pct: 1.9 % (ref 0.4–3.1)

## 2017-05-01 LAB — LACTATE DEHYDROGENASE: LDH: 190 U/L (ref 98–192)

## 2017-05-01 LAB — D-DIMER, QUANTITATIVE: D-Dimer, Quant: 0.33 ug/mL-FEU (ref 0.00–0.50)

## 2017-05-01 LAB — FERRITIN: FERRITIN: 3 ng/mL — AB (ref 24–336)

## 2017-05-01 LAB — LACTIC ACID, PLASMA
Lactic Acid, Venous: 3.5 mmol/L (ref 0.5–1.9)
Lactic Acid, Venous: 3.2 mmol/L (ref 0.5–1.9)

## 2017-05-01 LAB — PROCALCITONIN

## 2017-05-01 LAB — FOLATE: Folate: 20.7 ng/mL (ref >5.9)

## 2017-05-01 LAB — VITAMIN B12: Vitamin B-12: 744 pg/mL (ref 180–914)

## 2017-05-01 MED ORDER — BUDESONIDE 0.5 MG/2ML IN SUSP
0.5000 mg | Freq: Two times a day (BID) | RESPIRATORY_TRACT | Status: DC
  Administered 2017-05-01 – 2017-05-06 (×11): 0.5 mg via RESPIRATORY_TRACT
  Filled 2017-05-01 (×15): qty 2

## 2017-05-01 MED ORDER — HYDRALAZINE HCL 20 MG/ML IJ SOLN: 5.0000 mg | INTRAMUSCULAR | Status: DC | PRN

## 2017-05-01 MED ORDER — ASPIRIN EC 81 MG PO TBEC
81.0000 mg | DELAYED_RELEASE_TABLET | Freq: Every day | ORAL | Status: DC
  Administered 2017-05-01: 81 mg via ORAL
  Filled 2017-05-01: qty 1

## 2017-05-01 MED ORDER — TRIAMCINOLONE ACETONIDE 55 MCG/ACT NA AERO: 2.0000 | INHALATION_SPRAY | Freq: Every day | NASAL | Status: DC | PRN

## 2017-05-01 MED ORDER — TIOTROPIUM BROMIDE MONOHYDRATE 18 MCG IN CAPS
18.0000 ug | ORAL_CAPSULE | Freq: Every day | RESPIRATORY_TRACT | Status: DC
  Administered 2017-05-03: 18 ug via RESPIRATORY_TRACT
  Filled 2017-05-01: qty 5

## 2017-05-01 MED ORDER — DEXTROSE 5 % IV SOLN
1.0000 g | Freq: Once | INTRAVENOUS | Status: AC
  Administered 2017-05-01: 1 g via INTRAVENOUS
  Filled 2017-05-01: qty 10

## 2017-05-01 MED ORDER — AZITHROMYCIN 500 MG IV SOLR
500.0000 mg | INTRAVENOUS | Status: DC
  Administered 2017-05-01 – 2017-05-03 (×3): 500 mg via INTRAVENOUS
  Filled 2017-05-01 (×4): qty 500

## 2017-05-01 MED ORDER — FLUTICASONE PROPIONATE 50 MCG/ACT NA SUSP: 2.0000 | Freq: Every day | NASAL | Status: DC | PRN

## 2017-05-01 MED ORDER — ENOXAPARIN SODIUM 40 MG/0.4ML ~~LOC~~ SOLN
40.0000 mg | Freq: Every day | SUBCUTANEOUS | Status: DC
  Administered 2017-05-01: 40 mg via SUBCUTANEOUS
  Filled 2017-05-01 (×2): qty 0.4

## 2017-05-01 MED ORDER — IOPAMIDOL (ISOVUE-370) INJECTION 76%
INTRAVENOUS | Status: AC
  Administered 2017-05-01: 100 mL
  Filled 2017-05-01: qty 100

## 2017-05-01 MED ORDER — DEXTROSE 5 % IV SOLN: 1.0000 g | INTRAVENOUS | Status: DC

## 2017-05-01 MED ORDER — ENSURE ENLIVE PO LIQD
237.0000 mL | Freq: Three times a day (TID) | ORAL | Status: DC
  Administered 2017-05-01 – 2017-05-06 (×13): 237 mL via ORAL
  Filled 2017-05-01: qty 237

## 2017-05-01 MED ORDER — LEVALBUTEROL HCL 1.25 MG/0.5ML IN NEBU
1.2500 mg | INHALATION_SOLUTION | Freq: Four times a day (QID) | RESPIRATORY_TRACT | Status: DC
  Administered 2017-05-01 – 2017-05-02 (×7): 1.25 mg via RESPIRATORY_TRACT
  Filled 2017-05-01 (×9): qty 0.5

## 2017-05-01 MED ORDER — DM-GUAIFENESIN ER 30-600 MG PO TB12
1.0000 | ORAL_TABLET | Freq: Two times a day (BID) | ORAL | Status: DC | PRN
  Administered 2017-05-02 – 2017-05-04 (×3): 1 via ORAL
  Filled 2017-05-01 (×3): qty 1

## 2017-05-01 MED ORDER — PRAMIPEXOLE DIHYDROCHLORIDE 0.125 MG PO TABS
0.1250 mg | ORAL_TABLET | Freq: Every day | ORAL | Status: DC
  Administered 2017-05-01 – 2017-05-05 (×5): 0.125 mg via ORAL
  Filled 2017-05-01 (×6): qty 1

## 2017-05-01 MED ORDER — NITROGLYCERIN 0.4 MG SL SUBL: 0.4000 mg | SUBLINGUAL_TABLET | SUBLINGUAL | Status: DC | PRN

## 2017-05-01 MED ORDER — METHYLPREDNISOLONE SODIUM SUCC 125 MG IJ SOLR
60.0000 mg | Freq: Three times a day (TID) | INTRAMUSCULAR | Status: DC
  Administered 2017-05-01 – 2017-05-03 (×6): 60 mg via INTRAVENOUS
  Filled 2017-05-01 (×6): qty 2

## 2017-05-01 MED ORDER — SODIUM CHLORIDE 0.9 % IV SOLN
INTRAVENOUS | Status: DC
  Administered 2017-05-01: 10:00:00 via INTRAVENOUS

## 2017-05-01 MED ORDER — ALBUTEROL (5 MG/ML) CONTINUOUS INHALATION SOLN
10.0000 mg/h | INHALATION_SOLUTION | Freq: Once | RESPIRATORY_TRACT | Status: AC
  Administered 2017-05-01: 10 mg/h via RESPIRATORY_TRACT
  Filled 2017-05-01: qty 2

## 2017-05-01 MED ORDER — ONDANSETRON HCL 4 MG/2ML IJ SOLN: 4.0000 mg | Freq: Three times a day (TID) | INTRAMUSCULAR | Status: DC | PRN

## 2017-05-01 MED ORDER — SODIUM CHLORIDE 0.9 % IV BOLUS (SEPSIS)
2500.0000 mL | Freq: Once | INTRAVENOUS | Status: AC
  Administered 2017-05-01: 2500 mL via INTRAVENOUS

## 2017-05-01 MED ORDER — PANTOPRAZOLE SODIUM 40 MG PO TBEC
40.0000 mg | DELAYED_RELEASE_TABLET | Freq: Every day | ORAL | Status: DC
Start: 2017-05-01 — End: 2017-05-06
  Administered 2017-05-01 – 2017-05-06 (×6): 40 mg via ORAL
  Filled 2017-05-01 (×6): qty 1

## 2017-05-01 MED ORDER — IBUPROFEN 600 MG PO TABS
600.0000 mg | ORAL_TABLET | Freq: Four times a day (QID) | ORAL | Status: DC | PRN
  Administered 2017-05-01: 600 mg via ORAL
  Filled 2017-05-01: qty 1

## 2017-05-01 MED ORDER — ROSUVASTATIN CALCIUM 20 MG PO TABS
20.0000 mg | ORAL_TABLET | Freq: Every day | ORAL | Status: DC
  Administered 2017-05-01 – 2017-05-05 (×5): 20 mg via ORAL
  Filled 2017-05-01 (×6): qty 1

## 2017-05-01 MED ORDER — CLOPIDOGREL BISULFATE 75 MG PO TABS
75.0000 mg | ORAL_TABLET | Freq: Every day | ORAL | Status: DC
  Administered 2017-05-01: 75 mg via ORAL
  Filled 2017-05-01: qty 1

## 2017-05-01 MED ORDER — ACETAMINOPHEN 325 MG PO TABS
650.0000 mg | ORAL_TABLET | Freq: Four times a day (QID) | ORAL | Status: DC | PRN
  Administered 2017-05-02 – 2017-05-03 (×2): 650 mg via ORAL
  Filled 2017-05-01 (×2): qty 2

## 2017-05-01 MED ORDER — ZOLPIDEM TARTRATE 5 MG PO TABS
5.0000 mg | ORAL_TABLET | Freq: Every evening | ORAL | Status: DC | PRN
  Administered 2017-05-06: 5 mg via ORAL
  Filled 2017-05-01: qty 1

## 2017-05-01 MED ORDER — AZITHROMYCIN 500 MG IV SOLR: 500.0000 mg | INTRAVENOUS | Status: DC

## 2017-05-01 MED ORDER — MONTELUKAST SODIUM 4 MG PO CHEW
4.0000 mg | CHEWABLE_TABLET | Freq: Every day | ORAL | Status: DC
  Administered 2017-05-01 – 2017-05-05 (×5): 4 mg via ORAL
  Filled 2017-05-01 (×7): qty 1

## 2017-05-01 MED ORDER — NICOTINE 21 MG/24HR TD PT24
21.0000 mg | MEDICATED_PATCH | Freq: Every day | TRANSDERMAL | Status: DC
  Administered 2017-05-01 – 2017-05-06 (×6): 21 mg via TRANSDERMAL
  Filled 2017-05-01 (×6): qty 1

## 2017-05-01 MED ORDER — IPRATROPIUM BROMIDE 0.02 % IN SOLN
0.5000 mg | RESPIRATORY_TRACT | Status: DC
  Administered 2017-05-01 (×5): 0.5 mg via RESPIRATORY_TRACT
  Filled 2017-05-01 (×5): qty 2.5

## 2017-05-01 MED ORDER — IPRATROPIUM BROMIDE 0.02 % IN SOLN
0.5000 mg | Freq: Four times a day (QID) | RESPIRATORY_TRACT | Status: DC
  Administered 2017-05-02 (×4): 0.5 mg via RESPIRATORY_TRACT
  Filled 2017-05-01 (×4): qty 2.5

## 2017-05-01 MED ORDER — AZITHROMYCIN 500 MG IV SOLR
500.0000 mg | Freq: Once | INTRAVENOUS | Status: AC
  Administered 2017-05-01: 500 mg via INTRAVENOUS
  Filled 2017-05-01: qty 500

## 2017-05-01 MED ORDER — SODIUM CHLORIDE 0.9 % IV BOLUS (SEPSIS)
500.0000 mL | Freq: Once | INTRAVENOUS | Status: AC
  Administered 2017-05-01: 500 mL via INTRAVENOUS

## 2017-05-01 MED ORDER — DEXTROSE 5 % IV SOLN
1.0000 g | INTRAVENOUS | Status: DC
  Administered 2017-05-01 – 2017-05-03 (×3): 1 g via INTRAVENOUS
  Filled 2017-05-01 (×4): qty 10

## 2017-05-01 MED ORDER — SODIUM CHLORIDE 0.9 % IV BOLUS (SEPSIS): 1000.0000 mL | Freq: Once | INTRAVENOUS | Status: DC

## 2017-05-01 NOTE — ED Provider Notes (Signed)
[facility] EMERGENCY DEPARTMENT Provider Note   CSN: [id number] Arrival date & time: 05/01/17  0134     History   Chief Complaint Chief Complaint  Patient presents with  . Shortness of Breath    HPI John Marsh is a 71 y.o. male.  The history is provided by the patient.  Shortness of Breath  This is a new problem. The problem occurs continuously.The current episode started 6 to 12 hours ago. The problem has been rapidly worsening. Associated symptoms include cough, wheezing and chest pain. Pertinent negatives include no fever, no hemoptysis, no vomiting and no leg swelling. Risk factors include smoking. He has tried beta-agonist inhalers for the symptoms. The treatment provided no relief. He has had prior hospitalizations. He has had prior ICU admissions. Associated medical issues include COPD and CAD.   Patient with h/o COPD/CAD, on home oxygen [facility]) smokes one cigarette/month presents with worsening cough/sob over past 6 hrs He reports it is similar to prior COPD exacerbations He did have CP earlier in the night, and he took NTG and this improved  Past Medical History:  Diagnosis Date  . COPD (chronic obstructive pulmonary disease) (HCC)   . Coronary heart disease   . Emphysema of lung (HCC)   . Hyperlipidemia   . Hypertension   . MI (myocardial infarction) (HCC)   . Multiple lung nodules on CT   . Nocturnal hypoxia   . Peripheral arterial disease (HCC)   . Shortness of breath dyspnea     Patient Active Problem List   Diagnosis Date Noted  . Protein-calorie malnutrition, severe 09/21/2016  . Elevated troponin 09/19/2016  . Chest pain 06/30/2016  . PVD (peripheral vascular disease) with claudication (HCC) 07/27/2015  . Tachycardia 07/03/2015  . Chronic respiratory failure with hypoxia (HCC) 07/03/2015  . Dyspnea 06/03/2015  . Allergic rhinitis 06/03/2015  . Emphysema lung (HCC) 03/04/2015  . Nocturnal hypoxia 03/04/2015  . Pulmonary nodules  08/10/2014  . HYPERTRIGLYCERIDEMIA 08/16/2006  . HLD (hyperlipidemia) 08/16/2006  . TOBACCO DEPENDENCE 08/16/2006  . HYPERTENSION, BENIGN SYSTEMIC 08/16/2006  . CAD (coronary artery disease), native coronary artery 08/16/2006  . COPD (chronic obstructive pulmonary disease) with emphysema (HCC) 08/16/2006    Past Surgical History:  Procedure Laterality Date  . CHOLECYSTECTOMY    . NASAL SEPTUM SURGERY    . stent placed         Home Medications    Prior to Admission medications   Medication Sig Start Date End Date Taking? Authorizing Provider  albuterol (PROVENTIL HFA;VENTOLIN HFA) 108 (90 Base) MCG/ACT inhaler Inhale 2 puffs into the lungs every 6 (six) hours as needed for wheezing or shortness of breath. 02/05/17   Roslynn Amble, MD  albuterol (PROVENTIL) (2.5 MG/3ML) 0.083% nebulizer solution Take 3 mLs (2.5 mg total) by nebulization every 4 (four) hours as needed for wheezing or shortness of breath. 09/29/16   Roslynn Amble, MD  amoxicillin-clavulanate (AUGMENTIN) 875-125 MG tablet Take 1 tablet by mouth 2 (two) times daily. 02/05/17   Roslynn Amble, MD  aspirin EC 81 MG tablet Take 81 mg by mouth daily.    [provider]  benzonatate (TESSALON) 100 MG capsule Take 1 capsule (100 mg total) by mouth 3 (three) times daily. 09/26/16   Narda Bonds, MD  budesonide (PULMICORT) 0.5 MG/2ML nebulizer solution Take 2 mLs (0.5 mg total) by nebulization 2 (two) times daily. 03/07/17   Roslynn Amble, MD  budesonide-formoterol [facility]) 160-4.5 MCG/ACT inhaler Inhale 2  puffs into the lungs 2 (two) times daily.    [provider]  clopidogrel (PLAVIX) 75 MG tablet Take 75 mg by mouth daily.    [provider]  feeding supplement, ENSURE ENLIVE, (ENSURE ENLIVE) LIQD Take 237 mLs by mouth 3 (three) times daily between meals. 09/26/16   Narda BondsNettey, Ralph A, MD  fluticasone (FLONASE) 50 MCG/ACT nasal spray Place 2 sprays into both nostrils daily as needed  for allergies or rhinitis. 09/26/16   Narda BondsNettey, Ralph A, MD  formoterol (PERFOROMIST) 20 MCG/2ML nebulizer solution Take 2 mLs (20 mcg total) by nebulization 2 (two) times daily. 02/13/17   Roslynn AmbleNestor, Jennings E, MD  guaiFENesin (MUCINEX) 600 MG 12 hr tablet Take 1 tablet (600 mg total) by mouth 2 (two) times daily. 09/26/16   Narda BondsNettey, Ralph A, MD  ibuprofen (ADVIL,MOTRIN) 600 MG tablet Take 1 tablet (600 mg total) by mouth every 6 (six) hours as needed. Patient taking differently: Take 600 mg by mouth every 6 (six) hours as needed for moderate pain.  07/01/16   Erick BlinksMemon, Jehanzeb, MD  montelukast (SINGULAIR) 5 MG chewable tablet CHEW 1 TABLET BY MOUTH AT BEDTIME 02/01/16   Roslynn AmbleNestor, Jennings E, MD  nicotine (NICODERM CQ - DOSED IN MG/24 HOURS) 21 mg/24hr patch Place 1 patch (21 mg total) onto the skin daily. 09/27/16   Narda BondsNettey, Ralph A, MD  nitroGLYCERIN (NITROSTAT) 0.4 MG SL tablet Place 0.4 mg under the tongue every 5 (five) minutes as needed for chest pain. x3 doses as needed for chest pain    [provider]  pantoprazole (PROTONIX) 40 MG tablet Take 1 tablet (40 mg total) by mouth daily. 07/01/16   Erick BlinksMemon, Jehanzeb, MD  pramipexole (MIRAPEX) 0.125 MG tablet Take 1 tablet by mouth at bedtime. 06/21/16   [provider]  rosuvastatin (CRESTOR) 20 MG tablet Take 20 mg by mouth at bedtime.    [provider]  Spacer/Aero-Holding Chambers (AEROCHAMBER Z-STAT PLUS) inhaler Use as instructed 03/04/15   Roslynn AmbleNestor, Jennings E, MD  tiotropium (SPIRIVA) 18 MCG inhalation capsule Place 18 mcg into inhaler and inhale daily.    [provider]  triamcinolone (NASACORT ALLERGY 24HR) 55 MCG/ACT AERO nasal inhaler Place 2 sprays into the nose daily.    [provider]    Family History Family History  Problem Relation Age of Onset  . Heart disease Mother        died at 2757  . Heart disease Father   . Heart disease Brother   . Heart disease Sister   . Lung disease Neg Hx     Social  History Social History   Tobacco Use  . Smoking status: Current Some Day Smoker    Packs/day: 0.50    Years: 55.00    Pack years: 27.50    Types: Cigarettes    Start date: 08/01/1959  . Smokeless tobacco: Never Used  . Tobacco comment: 1/2 ppd 06/03/15 - peak 3ppd   taking Chantix currently 07-20-2015  Substance Use Topics  . Alcohol use: No    Alcohol/week: 0.0 oz  . Drug use: No     Allergies   Patient has no known allergies.   Review of Systems Review of Systems  Constitutional: Negative for fever.  Respiratory: Positive for cough, shortness of breath and wheezing. Negative for hemoptysis.   Cardiovascular: Positive for chest pain. Negative for leg swelling.  Gastrointestinal: Negative for blood in stool and vomiting.  Genitourinary: Negative for hematuria.  All other systems reviewed and are  negative.    Physical Exam Updated Vital Signs BP (!) 129/56   Pulse (!) 144   Temp 98.9 F (37.2 C) (Oral)   Resp (!) 22   Ht 1.829 m (6')   Wt 78 kg (172 lb)   SpO2 100%   BMI 23.33 kg/m   Physical Exam CONSTITUTIONAL: Elderly, distress noted HEAD: Normocephalic/atraumatic EYES: EOMI/PERRL ENMT: Mucous membranes moist NECK: supple no meningeal signs SPINE/BACK:entire spine nontender CV: S1/S2 noted, no murmurs/rubs/gallops noted, tachycardic LUNGS: tachypneic/wheezing bilaterally ABDOMEN: soft, nontender, no rebound or guarding, bowel sounds noted throughout abdomen GU:no cva tenderness NEURO: Pt is awake/alert/appropriate, moves all extremitiesx4.  No facial droop.   EXTREMITIES: pulses normal/equal, full ROM, no calf tenderness/edema SKIN: warm, color normal PSYCH: no abnormalities of mood noted, alert and oriented to situation   ED Treatments / Results  Labs (all labs ordered are listed, but only abnormal results are displayed) Labs Reviewed  BASIC METABOLIC PANEL - Abnormal; Notable for the following components:      Result Value   Chloride 99 (*)     Glucose, Bld 305 (*)    BUN 21 (*)    Calcium 8.7 (*)    All other components within normal limits  CBC WITH DIFFERENTIAL/PLATELET - Abnormal; Notable for the following components:   RBC 3.98 (*)    Hemoglobin 8.2 (*)    HCT 29.2 (*)    MCV 73.4 (*)    MCH 20.6 (*)    MCHC 28.1 (*)    RDW 16.6 (*)    All other components within normal limits  BLOOD GAS, ARTERIAL  I-STAT TROPONIN, ED  POC OCCULT BLOOD, ED    EKG  EKG Interpretation  Date/Time:  Tuesday May 01 2017 01:37:01 EST Ventricular Rate:  146 PR Interval:    QRS Duration: 92 QT Interval:  316 QTC Calculation: 491 R Axis:   59 Text Interpretation:  Sinus or ectopic atrial tachycardia Low voltage, precordial leads Repolarization abnormality, prob rate related Minimal ST elevation, lateral leads Interpretation limited secondary to artifact Abnormal ekg Confirmed by Zadie Rhine ([id number]) on 05/01/2017 2:01:56 AM       Radiology Dg Chest Portable 1 View  Result Date: 05/01/2017 CLINICAL DATA:  Acute onset of worsening shortness of breath. EXAM: PORTABLE CHEST 1 VIEW COMPARISON:  Chest radiograph performed 09/21/2016, and CT of the chest performed 02/01/2017 FINDINGS: The lungs are hyperexpanded, with flattening of the hemidiaphragms compatible with COPD. Scarring is noted at the right lung apex. Mild right basilar airspace opacity could reflect mild pneumonia. There is no evidence of pleural effusion or pneumothorax. The cardiomediastinal silhouette is normal in size. No acute osseous abnormalities are identified. IMPRESSION: 1. Mild right basilar airspace opacity could reflect mild pneumonia. 2. Findings of COPD, with scarring at the right lung apex. Electronically Signed   By: Roanna Raider M.D.   On: 05/01/2017 02:21    Procedures Procedures   CRITICAL CARE Performed by: Joya Gaskins Total critical care time: 35 minutes Critical care time was exclusive of separately billable procedures and treating other  patients. Critical care was necessary to treat or prevent imminent or life-threatening deterioration. Critical care was time spent personally by me on the following activities: development of treatment plan with patient and/or surrogate as well as nursing, discussions with consultants, evaluation of patient's response to treatment, examination of patient, obtaining history from patient or surrogate, ordering and performing treatments and interventions, ordering and review of laboratory studies, ordering and review of radiographic studies, pulse  oximetry and re-evaluation of patient's condition. PATIENT WITH COPD EXACERBATION REQUIRING MULTIPLE NEBULIZED TREATMENTS INCLUDING HOUR LONG ALBUTEROL NEB TREATMENT.  HE WILL NEED ADMISSION/MONITORING  Medications Ordered in ED Medications  azithromycin (ZITHROMAX) 500 mg in dextrose 5 % 250 mL IVPB (not administered)  0.9 %  sodium chloride infusion (not administered)  sodium chloride 0.9 % bolus 2,500 mL (not administered)  albuterol (PROVENTIL,VENTOLIN) solution continuous neb (10 mg/hr Nebulization Given 05/01/17 0243)  cefTRIAXone (ROCEPHIN) 1 g in dextrose 5 % 50 mL IVPB (1 g Intravenous New Bag/Given 05/01/17 0254)     Initial Impression / Assessment and Plan / ED Course  I have reviewed the triage vital signs and the nursing notes.  Pertinent labs & imaging results that were available during my care of the patient were reviewed by me and considered in my medical decision making (see chart for details).     2:07 AM Pt in the ED for COPD exacerbation He was already given multiple rounds of nebs/solumedrol/magnesium He is now on his home oxygen Will follow closely 2:22 AM Pt found to be anemic, unknown chronicity Denies recent blood in stool Rectal performed with nurse present, no blood/melena He is SOB with any movement in the bed, will order hour long neb 4:27 AM Pneumonia noted Pt wheezing/tachycardic I feel admission warranted as  he is not at baseline Patient agreeable D/w dr Clyde Lundborgniu for admission for COPD exacerbation/pneumonia as well anemia of unknown etiology  Final Clinical Impressions(s) / ED Diagnoses   Final diagnoses:  COPD exacerbation (HCC)  Community acquired pneumonia of right lower lobe of lung (HCC)  Acute anemia    ED Discharge Orders    None       Zadie RhineWickline, Aymara Sassi, MD 05/01/17 [phone]

## 2017-05-01 NOTE — ED Notes (Signed)
Patient denies pain and is resting comfortably.  

## 2017-05-01 NOTE — ED Triage Notes (Signed)
Pt transported from home by [facility] with c/o worsening shob. Pt denies pain, diminished LS, Albuterol x 1 and Duoneb x 2 enroute with several tx at home. Mag 2gm , Solumedrol 125mg  IVP given. Per EMS pt did have chest discomfort 1 hour PTA, took 1 SL nitro that relieved pain. Pt has been taking Mucinex at home  IV est by EMS.

## 2017-05-01 NOTE — H&P (Addendum)
History and Physical    John Marsh:[id number] DOB: Dec 11, 1945 DOA: 05/01/2017  Referring MD/NP/PA:   PCP: Eartha Inch, MD   Patient coming from:  The patient is coming from home.  At baseline, pt is independent for most of ADL.   Chief Complaint: shortness of breath  HPI: John Marsh is a 71 y.o. male with medical history significant of COPD, chronic respiratory failure on 3 L oxygen at home, hypertension, hyperlipidemia, GERD, PVD, CAD, tobacco abuse, who presents with shortness of breath.  Patient states that he has been having shortness breath in the past several days, which has been progressively getting worse. He has cough with little white mucus production. Patient can speak in full sentence, but needs more oxygen than baseline. Denies fever or chills. No runny nose or sore throat. Patient states that he had mildly chest pain earlier, which has resolved. Currently no chest pain. Denies nausea, vomiting, diarrhea, abdominal pain, symptoms of UTI or unilateral weakness. Denies hematuria, hematochezia, hematemesis.  ED Course: pt was found to have WBC 17.7, hemoglobin 8.3 which was 13.5 on 09/26/16, negative FOBT, electrolytes renal function okay, temperature normal, tachycardia, tachypnea, negative troponin. Chest x-ray showed infiltration in right base.Patient is admitted to telemetry bed as inpatient.  Review of Systems:   General: no fevers, chills, no body weight gain, has poor appetite, has fatigue HEENT: no blurry vision, hearing changes or sore throat Respiratory: has dyspnea, coughing, wheezing CV: no chest pain, no palpitations GI: no nausea, vomiting, abdominal pain, diarrhea, constipation GU: no dysuria, burning on urination, increased urinary frequency, hematuria  Ext: no leg edema Neuro: no unilateral weakness, numbness, or tingling, no vision change or hearing loss Skin: no rash, no skin tear. MSK: No muscle spasm, no deformity, no limitation of range of  movement in spin Heme: No easy bruising.  Travel history: No recent long distant travel.  Allergy: No Known Allergies  Past Medical History:  Diagnosis Date  . COPD (chronic obstructive pulmonary disease) (HCC)   . Coronary heart disease   . Emphysema of lung (HCC)   . Hyperlipidemia   . Hypertension   . MI (myocardial infarction) (HCC)   . Multiple lung nodules on CT   . Nocturnal hypoxia   . Peripheral arterial disease (HCC)   . Shortness of breath dyspnea     Past Surgical History:  Procedure Laterality Date  . CHOLECYSTECTOMY    . NASAL SEPTUM SURGERY    . stent placed      Social History:  reports that he has been smoking cigarettes.  He started smoking about 57 years ago. He has a 27.50 pack-year smoking history. he has never used smokeless tobacco. He reports that he does not drink alcohol or use drugs.  Family History:  Family History  Problem Relation Age of Onset  . Heart disease Mother        died at 67  . Heart disease Father   . Heart disease Brother   . Heart disease Sister   . Lung disease Neg Hx      Prior to Admission medications   Medication Sig Start Date End Date Taking? Authorizing Provider  albuterol (PROVENTIL HFA;VENTOLIN HFA) 108 (90 Base) MCG/ACT inhaler Inhale 2 puffs into the lungs every 6 (six) hours as needed for wheezing or shortness of breath. 02/05/17   Roslynn Amble, MD  albuterol (PROVENTIL) (2.5 MG/3ML) 0.083% nebulizer solution Take 3 mLs (2.5 mg total) by nebulization every 4 (four) hours  as needed for wheezing or shortness of breath. 09/29/16   Roslynn AmbleNestor, Jennings E, MD  amoxicillin-clavulanate (AUGMENTIN) 875-125 MG tablet Take 1 tablet by mouth 2 (two) times daily. 02/05/17   Roslynn AmbleNestor, Jennings E, MD  aspirin EC 81 MG tablet Take 81 mg by mouth daily.    [provider]  benzonatate (TESSALON) 100 MG capsule Take 1 capsule (100 mg total) by mouth 3 (three) times daily. 09/26/16   Narda BondsNettey, Ralph A, MD  budesonide (PULMICORT)  0.5 MG/2ML nebulizer solution Take 2 mLs (0.5 mg total) by nebulization 2 (two) times daily. 03/07/17   Roslynn AmbleNestor, Jennings E, MD  budesonide-formoterol [facility](SYMBICORT) 160-4.5 MCG/ACT inhaler Inhale 2 puffs into the lungs 2 (two) times daily.    [provider]  clopidogrel (PLAVIX) 75 MG tablet Take 75 mg by mouth daily.    [provider]  feeding supplement, ENSURE ENLIVE, (ENSURE ENLIVE) LIQD Take 237 mLs by mouth 3 (three) times daily between meals. 09/26/16   Narda BondsNettey, Ralph A, MD  fluticasone (FLONASE) 50 MCG/ACT nasal spray Place 2 sprays into both nostrils daily as needed for allergies or rhinitis. 09/26/16   Narda BondsNettey, Ralph A, MD  formoterol (PERFOROMIST) 20 MCG/2ML nebulizer solution Take 2 mLs (20 mcg total) by nebulization 2 (two) times daily. 02/13/17   Roslynn AmbleNestor, Jennings E, MD  guaiFENesin (MUCINEX) 600 MG 12 hr tablet Take 1 tablet (600 mg total) by mouth 2 (two) times daily. 09/26/16   Narda BondsNettey, Ralph A, MD  ibuprofen (ADVIL,MOTRIN) 600 MG tablet Take 1 tablet (600 mg total) by mouth every 6 (six) hours as needed. Patient taking differently: Take 600 mg by mouth every 6 (six) hours as needed for moderate pain.  07/01/16   Erick BlinksMemon, Jehanzeb, MD  montelukast (SINGULAIR) 5 MG chewable tablet CHEW 1 TABLET BY MOUTH AT BEDTIME 02/01/16   Roslynn AmbleNestor, Jennings E, MD  nicotine (NICODERM CQ - DOSED IN MG/24 HOURS) 21 mg/24hr patch Place 1 patch (21 mg total) onto the skin daily. 09/27/16   Narda BondsNettey, Ralph A, MD  nitroGLYCERIN (NITROSTAT) 0.4 MG SL tablet Place 0.4 mg under the tongue every 5 (five) minutes as needed for chest pain. x3 doses as needed for chest pain    [provider]  pantoprazole (PROTONIX) 40 MG tablet Take 1 tablet (40 mg total) by mouth daily. 07/01/16   Erick BlinksMemon, Jehanzeb, MD  pramipexole (MIRAPEX) 0.125 MG tablet Take 1 tablet by mouth at bedtime. 06/21/16   [provider]  rosuvastatin (CRESTOR) 20 MG tablet Take 20 mg by mouth at bedtime.    [provider]    Spacer/Aero-Holding Chambers (AEROCHAMBER Z-STAT PLUS) inhaler Use as instructed 03/04/15   Roslynn AmbleNestor, Jennings E, MD  tiotropium (SPIRIVA) 18 MCG inhalation capsule Place 18 mcg into inhaler and inhale daily.    [provider]  triamcinolone (NASACORT ALLERGY 24HR) 55 MCG/ACT AERO nasal inhaler Place 2 sprays into the nose daily.    [provider]    Physical Exam: Vitals:   05/01/17 0400 05/01/17 0415 05/01/17 0430 05/01/17 0441  BP: 127/84 131/70 (!) 123/56   Pulse: (!) 146 (!) 144 (!) 143   Resp:      Temp:      TempSrc:      SpO2: 98% 98% 97% 97%  Weight:      Height:       General: Not in acute distress HEENT:       Eyes: PERRL, EOMI, no scleral icterus.       ENT: No  discharge from the ears and nose, no pharynx injection, no tonsillar enlargement.        Neck: No JVD, no bruit, no mass felt. Heme: No neck lymph node enlargement. Cardiac: S1/S2, RRR, No murmurs, No gallops or rubs. Respiratory: severely decreased air movement bilaterally. Has wheezing bilaterally. GI: Soft, nondistended, nontender, no rebound pain, no organomegaly, BS present. GU: No hematuria Ext: No pitting leg edema bilaterally. 2+DP/PT pulse bilaterally. Musculoskeletal: No joint deformities, No joint redness or warmth, no limitation of ROM in spin. Skin: No rashes.  Neuro: Alert, oriented X3, cranial nerves II-XII grossly intact, moves all extremities normally.  Psych: Patient is not psychotic, no suicidal or hemocidal ideation.  Labs on Admission: I have personally reviewed following labs and imaging studies  CBC: Recent Labs  Lab 05/01/17 0145  WBC 7.7  NEUTROABS 5.7  HGB 8.2*  HCT 29.2*  MCV 73.4*  PLT 232   Basic Metabolic Panel: Recent Labs  Lab 05/01/17 0145  NA 136  K 3.8  CL 99*  CO2 29  GLUCOSE 305*  BUN 21*  CREATININE 1.07  CALCIUM 8.7*   GFR: Estimated Creatinine Clearance: 68.5 mL/min (by C-G formula based on SCr of 1.07 mg/dL). Liver Function  Tests: No results for input(s): AST, ALT, ALKPHOS, BILITOT, PROT, ALBUMIN in the last 168 hours. No results for input(s): LIPASE, AMYLASE in the last 168 hours. No results for input(s): AMMONIA in the last 168 hours. Coagulation Profile: No results for input(s): INR, PROTIME in the last 168 hours. Cardiac Enzymes: No results for input(s): CKTOTAL, CKMB, CKMBINDEX, TROPONINI in the last 168 hours. BNP (last 3 results) No results for input(s): PROBNP in the last 8760 hours. HbA1C: No results for input(s): HGBA1C in the last 72 hours. CBG: No results for input(s): GLUCAP in the last 168 hours. Lipid Profile: No results for input(s): CHOL, HDL, LDLCALC, TRIG, CHOLHDL, LDLDIRECT in the last 72 hours. Thyroid Function Tests: No results for input(s): TSH, T4TOTAL, FREET4, T3FREE, THYROIDAB in the last 72 hours. Anemia Panel: No results for input(s): VITAMINB12, FOLATE, FERRITIN, TIBC, IRON, RETICCTPCT in the last 72 hours. Urine analysis:    Component Value Date/Time   COLORURINE YELLOW 08/08/2012 1910   APPEARANCEUR CLEAR 08/08/2012 1910   LABSPEC 1.005 08/08/2012 1910   PHURINE 7.0 08/08/2012 1910   GLUCOSEU NEGATIVE 08/08/2012 1910   HGBUR NEGATIVE 08/08/2012 1910   BILIRUBINUR NEGATIVE 08/08/2012 1910   KETONESUR NEGATIVE 08/08/2012 1910   PROTEINUR NEGATIVE 08/08/2012 1910   UROBILINOGEN 0.2 08/08/2012 1910   NITRITE NEGATIVE 08/08/2012 1910   LEUKOCYTESUR TRACE (A) 08/08/2012 1910   Sepsis Labs: @LABRCNTIP (procalcitonin:4,lacticidven:4) )No results found for this or any previous visit (from the past 240 hour(s)).   Radiological Exams on Admission: Dg Chest Portable 1 View  Result Date: 05/01/2017 CLINICAL DATA:  Acute onset of worsening shortness of breath. EXAM: PORTABLE CHEST 1 VIEW COMPARISON:  Chest radiograph performed 09/21/2016, and CT of the chest performed 02/01/2017 FINDINGS: The lungs are hyperexpanded, with flattening of the hemidiaphragms compatible with COPD.  Scarring is noted at the right lung apex. Mild right basilar airspace opacity could reflect mild pneumonia. There is no evidence of pleural effusion or pneumothorax. The cardiomediastinal silhouette is normal in size. No acute osseous abnormalities are identified. IMPRESSION: 1. Mild right basilar airspace opacity could reflect mild pneumonia. 2. Findings of COPD, with scarring at the right lung apex. Electronically Signed   By: Roanna Raider M.D.   On: 05/01/2017 02:21     EKG:  Independently reviewed.  Sinus rhythm, tachycardia, QTC 491, nonspecific T-wave change  Assessment/Plan Principal Problem:   Acute on chronic respiratory failure with hypoxia (HCC) Active Problems:   HLD (hyperlipidemia)   Essential hypertension   CAD (coronary artery disease), native coronary artery   COPD with acute exacerbation (HCC)   Protein-calorie malnutrition, severe   Lobar pneumonia (HCC)   Microcytic anemia   Acute on chronic respiratory failure with hypoxia due to lobar Pneumonia and COPD exacerbation and sepsis: patient meets criteria for sepsis with tachycardia and tachypnea. Pending lactic acid. Currently hemodynamically stable  - will admit patient to telemetry bed  --Nebulizers: scheduled Atrovent and prn Xopenex Nebs - Continue home Pulmicort nebulizer, Spiriva inhaler and Singulair  -Solu-Medrol 60 mg IV tid - rocephin and azithromycin IV - Mucinex for cough  --Incentive spirometry - Urine S. pneumococcal and Legionella antigen - Follow up blood culture x2, sputum culture, respiratory virus panel, Flu pcr - ABG stat  HTN: 119/54. Patient is not taking medications at home -IV hydralazine when necessary  HLD -corestor  Hx of CAD (coronary artery disease), native coronary artery: no chest pain -continue Plavix, Crestor She'll when necessary nitroglycerin  Protein-calorie malnutrition, severe: -nutrition supplement -Nutrition consultation  Microcytic anemia: hemoglobin dropped  from 13.5 in April to 8.3 on admission. Etiology is not clear. No active bleeding. FOBT negative. Pt states that he may have had a negative colonoscopy in the remote past. -check LDH, haptoglobin and peripheral smear -patient will need a colonoscopy as outpt.   DVT ppx: SQ Lovenox Code Status: Full code Family Communication: None at bed side.    Disposition Plan:  Anticipate discharge back to previous home environment Consults called:  none Admission status:  Inpatient/tele      Date of Service 05/01/2017    Lorretta HarpIU, [place] [facility] Pager [phone]  If 7PM-7AM, please contact night-coverage [email or web address] Password [email or web address] 05/01/2017, 5:05 AM

## 2017-05-01 NOTE — ED Notes (Signed)
Attempted Report x1.   

## 2017-05-01 NOTE — ED Notes (Signed)
Meal tray at bedside.  

## 2017-05-01 NOTE — Progress Notes (Signed)
   Follow Up Note  HPI: Please refer to full H&P for details  Pt admitted earlier this morning for shortness of breath.  Patient currently being managed for severe sepsis and acute on chronic respiratory failure likely due to COPD exacerbation due to right lower lobe pneumonia.  Complained of 2/10 mild left-sided chest pain.  Exam: CV: Heart sounds S1-S2 present, no added heart sound Lungs: Decreased air entry bilaterally, bilateral wheezing noted Abd: Soft, nontender, nondistended, bowel sounds present Ext: No bilateral pedal edema noted  Present on Admission: . Protein-calorie malnutrition, severe . HLD (hyperlipidemia) . CAD (coronary artery disease), native coronary artery . Lobar pneumonia (HCC) . COPD with acute exacerbation (HCC) . Acute on chronic respiratory failure with hypoxia (HCC) . Microcytic anemia . Essential hypertension   PLAN #Severe sepsis Likely due to ?pneumonia Patient meets sepsis criteria, with elevated lactic acid Status post 3 L of IV fluids, will trend lactic acid Continue IV antibiotics, IV fluids  #Acute exacerbation of COPD Continue IV antibiotics, steroids, duonebs Monitor closely  #Elevated d-dimer CT chest angiogram pending as patient is also tachycardic  #CAD Complaint of mild 2/10 left-sided chest pain Troponin 0.03, EKG no ischemic changes We will trend troponin Monitor closely

## 2017-05-02 ENCOUNTER — Inpatient Hospital Stay (HOSPITAL_COMMUNITY): Payer: Medicare Other

## 2017-05-02 ENCOUNTER — Other Ambulatory Visit: Payer: Self-pay

## 2017-05-02 DIAGNOSIS — D509 Iron deficiency anemia, unspecified: Secondary | ICD-10-CM

## 2017-05-02 DIAGNOSIS — D649 Anemia, unspecified: Secondary | ICD-10-CM

## 2017-05-02 DIAGNOSIS — J9690 Respiratory failure, unspecified, unspecified whether with hypoxia or hypercapnia: Secondary | ICD-10-CM

## 2017-05-02 DIAGNOSIS — I10 Essential (primary) hypertension: Secondary | ICD-10-CM

## 2017-05-02 DIAGNOSIS — J441 Chronic obstructive pulmonary disease with (acute) exacerbation: Secondary | ICD-10-CM

## 2017-05-02 DIAGNOSIS — I2510 Atherosclerotic heart disease of native coronary artery without angina pectoris: Secondary | ICD-10-CM

## 2017-05-02 DIAGNOSIS — E782 Mixed hyperlipidemia: Secondary | ICD-10-CM

## 2017-05-02 DIAGNOSIS — J9621 Acute and chronic respiratory failure with hypoxia: Secondary | ICD-10-CM

## 2017-05-02 HISTORY — DX: Respiratory failure, unspecified, unspecified whether with hypoxia or hypercapnia: J96.90

## 2017-05-02 LAB — CBC WITH DIFFERENTIAL/PLATELET
BASOS PCT: 0 %
Basophils Absolute: 0 10*3/uL (ref 0.0–0.1)
EOS ABS: 0 10*3/uL (ref 0.0–0.7)
EOS PCT: 0 %
HCT: 25.6 % — ABNORMAL LOW (ref 39.0–52.0)
HEMOGLOBIN: 7.1 g/dL — AB (ref 13.0–17.0)
LYMPHS PCT: 5 %
Lymphs Abs: 0.7 10*3/uL (ref 0.7–4.0)
MCH: 20.5 pg — AB (ref 26.0–34.0)
MCHC: 27.7 g/dL — AB (ref 30.0–36.0)
MCV: 74 fL — AB (ref 78.0–100.0)
Monocytes Absolute: 0.5 10*3/uL (ref 0.1–1.0)
Monocytes Relative: 4 %
NEUTROS ABS: 11.9 10*3/uL — AB (ref 1.7–7.7)
NEUTROS PCT: 91 %
Platelets: 223 10*3/uL (ref 150–400)
RBC: 3.46 MIL/uL — ABNORMAL LOW (ref 4.22–5.81)
RDW: 17.2 % — ABNORMAL HIGH (ref 11.5–15.5)
WBC: 13.1 10*3/uL — ABNORMAL HIGH (ref 4.0–10.5)

## 2017-05-02 LAB — BLOOD GAS, ARTERIAL
ACID-BASE EXCESS: 5.8 mmol/L — AB (ref 0.0–2.0)
BICARBONATE: 30.2 mmol/L — AB (ref 20.0–28.0)
DRAWN BY: 511471
O2 CONTENT: 3 L/min
O2 SAT: 98 %
PATIENT TEMPERATURE: 98.2
PH ART: 7.428 (ref 7.350–7.450)
pCO2 arterial: 46.4 mmHg (ref 32.0–48.0)
pO2, Arterial: 108 mmHg (ref 83.0–108.0)

## 2017-05-02 LAB — BASIC METABOLIC PANEL
Anion gap: 6 (ref 5–15)
BUN: 20 mg/dL (ref 6–20)
CHLORIDE: 106 mmol/L (ref 101–111)
CO2: 28 mmol/L (ref 22–32)
CREATININE: 0.92 mg/dL (ref 0.61–1.24)
Calcium: 8.5 mg/dL — ABNORMAL LOW (ref 8.9–10.3)
GFR calc Af Amer: >60 mL/min (ref >60)
GFR calc non Af Amer: >60 mL/min (ref >60)
GLUCOSE: 198 mg/dL — AB (ref 65–99)
POTASSIUM: 4.9 mmol/L (ref 3.5–5.1)
Sodium: 140 mmol/L (ref 135–145)

## 2017-05-02 LAB — HEMOGLOBIN AND HEMATOCRIT, BLOOD
HEMATOCRIT: 32.2 % — AB (ref 39.0–52.0)
HEMOGLOBIN: 9.1 g/dL — AB (ref 13.0–17.0)

## 2017-05-02 LAB — TROPONIN I: TROPONIN I: 0.14 ng/mL — AB (ref <0.03)

## 2017-05-02 LAB — URINALYSIS, COMPLETE (UACMP) WITH MICROSCOPIC
Bacteria, UA: NONE SEEN
Bilirubin Urine: NEGATIVE
Glucose, UA: 100 mg/dL — AB
Ketones, ur: NEGATIVE mg/dL
Leukocytes, UA: NEGATIVE
Nitrite: NEGATIVE
Protein, ur: NEGATIVE mg/dL
SPECIFIC GRAVITY, URINE: 1.01 (ref 1.005–1.030)
Squamous Epithelial / LPF: NONE SEEN
pH: 6.5 (ref 5.0–8.0)

## 2017-05-02 LAB — BRAIN NATRIURETIC PEPTIDE: B NATRIURETIC PEPTIDE 5: 481.7 pg/mL — AB (ref 0.0–100.0)

## 2017-05-02 LAB — LACTIC ACID, PLASMA
LACTIC ACID, VENOUS: 2.5 mmol/L — AB (ref 0.5–1.9)
LACTIC ACID, VENOUS: 2.8 mmol/L — AB (ref 0.5–1.9)

## 2017-05-02 LAB — ABO/RH: ABO/RH(D): A NEG

## 2017-05-02 LAB — LEGIONELLA PNEUMOPHILA SEROGP 1 UR AG: L. PNEUMOPHILA SEROGP 1 UR AG: NEGATIVE

## 2017-05-02 LAB — PREPARE RBC (CROSSMATCH)

## 2017-05-02 LAB — HAPTOGLOBIN: HAPTOGLOBIN: 334 mg/dL — AB (ref 34–200)

## 2017-05-02 MED ORDER — DIPHENHYDRAMINE HCL 50 MG/ML IJ SOLN
25.0000 mg | Freq: Once | INTRAMUSCULAR | Status: AC
  Administered 2017-05-02: 25 mg via INTRAVENOUS

## 2017-05-02 MED ORDER — LEVALBUTEROL HCL 1.25 MG/0.5ML IN NEBU
1.2500 mg | INHALATION_SOLUTION | Freq: Three times a day (TID) | RESPIRATORY_TRACT | Status: DC
  Administered 2017-05-03 – 2017-05-06 (×10): 1.25 mg via RESPIRATORY_TRACT
  Filled 2017-05-02 (×10): qty 0.5

## 2017-05-02 MED ORDER — METHYLPREDNISOLONE SODIUM SUCC 125 MG IJ SOLR
125.0000 mg | Freq: Once | INTRAMUSCULAR | Status: AC
  Administered 2017-05-02: 125 mg via INTRAVENOUS

## 2017-05-02 MED ORDER — HEPARIN (PORCINE) IN NACL 100-0.45 UNIT/ML-% IJ SOLN
900.0000 [IU]/h | INTRAMUSCULAR | Status: DC
  Administered 2017-05-02: 1100 [IU]/h via INTRAVENOUS
  Administered 2017-05-03: 1000 [IU]/h via INTRAVENOUS
  Filled 2017-05-02 (×3): qty 250

## 2017-05-02 MED ORDER — SODIUM CHLORIDE 0.9 % IV SOLN
Freq: Once | INTRAVENOUS | Status: AC
  Administered 2017-05-02: 10:00:00 via INTRAVENOUS

## 2017-05-02 MED ORDER — METHYLPREDNISOLONE SODIUM SUCC 125 MG IJ SOLR
60.0000 mg | Freq: Once | INTRAMUSCULAR | Status: DC
  Administered 2017-05-02: 60 mg via INTRAVENOUS

## 2017-05-02 MED ORDER — FUROSEMIDE 10 MG/ML IJ SOLN
INTRAMUSCULAR | Status: AC
  Filled 2017-05-02: qty 4

## 2017-05-02 MED ORDER — IPRATROPIUM BROMIDE 0.02 % IN SOLN
0.5000 mg | Freq: Three times a day (TID) | RESPIRATORY_TRACT | Status: DC
  Administered 2017-05-03 – 2017-05-06 (×11): 0.5 mg via RESPIRATORY_TRACT
  Filled 2017-05-02 (×11): qty 2.5

## 2017-05-02 MED ORDER — LEVALBUTEROL HCL 0.63 MG/3ML IN NEBU
0.6300 mg | INHALATION_SOLUTION | Freq: Four times a day (QID) | RESPIRATORY_TRACT | Status: DC | PRN
  Administered 2017-05-02 – 2017-05-03 (×3): 0.63 mg via RESPIRATORY_TRACT
  Filled 2017-05-02 (×2): qty 3

## 2017-05-02 MED ORDER — DILTIAZEM HCL 100 MG IV SOLR
5.0000 mg/h | INTRAVENOUS | Status: DC
  Administered 2017-05-02: 5 mg/h via INTRAVENOUS
  Administered 2017-05-03 – 2017-05-04 (×5): 15 mg/h via INTRAVENOUS
  Filled 2017-05-02 (×6): qty 100

## 2017-05-02 MED ORDER — DILTIAZEM HCL 25 MG/5ML IV SOLN
5.0000 mg | Freq: Once | INTRAVENOUS | Status: AC
  Administered 2017-05-02: 5 mg via INTRAVENOUS
  Filled 2017-05-02: qty 5

## 2017-05-02 MED ORDER — SODIUM CHLORIDE 0.9 % IV BOLUS (SEPSIS)
500.0000 mL | Freq: Once | INTRAVENOUS | Status: AC
  Administered 2017-05-02: 500 mL via INTRAVENOUS

## 2017-05-02 MED ORDER — LEVALBUTEROL HCL 0.63 MG/3ML IN NEBU
INHALATION_SOLUTION | RESPIRATORY_TRACT | Status: AC
  Administered 2017-05-02: 0.63 mg via RESPIRATORY_TRACT
  Filled 2017-05-02: qty 3

## 2017-05-02 MED ORDER — DIPHENHYDRAMINE HCL 50 MG/ML IJ SOLN
INTRAMUSCULAR | Status: AC
  Filled 2017-05-02: qty 1

## 2017-05-02 MED ORDER — LEVALBUTEROL HCL 0.63 MG/3ML IN NEBU
INHALATION_SOLUTION | RESPIRATORY_TRACT | Status: AC
  Filled 2017-05-02: qty 3

## 2017-05-02 MED ORDER — FUROSEMIDE 10 MG/ML IJ SOLN
40.0000 mg | Freq: Once | INTRAMUSCULAR | Status: AC
  Administered 2017-05-02: 40 mg via INTRAVENOUS

## 2017-05-02 MED ORDER — METHYLPREDNISOLONE SODIUM SUCC 125 MG IJ SOLR
INTRAMUSCULAR | Status: AC
  Filled 2017-05-02: qty 2

## 2017-05-02 MED ORDER — LEVALBUTEROL HCL 1.25 MG/0.5ML IN NEBU
1.2500 mg | INHALATION_SOLUTION | Freq: Once | RESPIRATORY_TRACT | Status: AC
  Administered 2017-05-02: 1.25 mg via RESPIRATORY_TRACT

## 2017-05-02 MED ORDER — DIPHENHYDRAMINE HCL 50 MG/ML IJ SOLN
25.0000 mg | Freq: Once | INTRAMUSCULAR | Status: AC
  Administered 2017-05-02: 25 mg via INTRAVENOUS
  Filled 2017-05-02: qty 1

## 2017-05-02 NOTE — Progress Notes (Signed)
Pt obtained transfer orders for 2C. Report given over phone to [facility] RN. Pt transported by this RN and charge RN down to [facility] with belongings and medication at bedside. Met at bedside by [facility] RN. Medication given to RN. Pt transported without incident. WCTM

## 2017-05-02 NOTE — Progress Notes (Signed)
Initial Nutrition Assessment  DOCUMENTATION CODES:   Non-severe (moderate) malnutrition in context of chronic illness  INTERVENTION:   -Continue Ensure Enlive po TID, each supplement provides 350 kcal and 20 grams of protein  NUTRITION DIAGNOSIS:   Moderate Malnutrition related to chronic illness(COPD ) as evidenced by mild fat depletion, moderate fat depletion, mild muscle depletion, moderate muscle depletion, energy intake < 75% for > or equal to 1 month.  GOAL:   Patient will meet greater than or equal to 90% of their needs  MONITOR:   PO intake, Supplement acceptance, Labs, Weight trends, Skin, I & O's  REASON FOR ASSESSMENT:   Consult Assessment of nutrition requirement/status  ASSESSMENT:   John Marsh is a 71 y.o. male with medical history significant of COPD, chronic respiratory failure on 3 L oxygen at home, hypertension, hyperlipidemia, GERD, PVD, CAD, tobacco abuse, who presents with shortness of breath.  Pt admitted with COPD exacerbation and severe sepsis related to questionable pneumonia.   Spoke with pt at bedside, who reports his appetite is "horrible" at baseline. He eats very little food at home, if at all, will often go 2-3 days without eating. Noted pt consumed 2 pancakes and 50% of coffee at breakfast; he states "that's a lot more than I usually eat". He reports extreme early satiety. He shares that he started consuming 3 Ensure Plus supplements daily within the past 3 months, which is where he gets the majority of his nutrition (1050 kcals and 39 grams protein daily, meeting )  Pt estimates he has gained about 10# within the past 3 months related to using Ensure supplements.  Noted wt gain trend over the past 10 months. Also noted edema of upper an lower extremities, which may be masking additional wt loss.   Discussed with pt importance of good meal and supplement intake to promote healing. Encouraged pt to continue Ensure supplements at home.    Medications reviewed and include solu-medrol.   Labs reviewed.   NUTRITION - FOCUSED PHYSICAL EXAM:    Most Recent Value  Orbital Region  Moderate depletion  Upper Arm Region  Moderate depletion  Thoracic and Lumbar Region  Mild depletion  Buccal Region  Mild depletion  Temple Region  Mild depletion  Clavicle Bone Region  Moderate depletion  Clavicle and Acromion Bone Region  Mild depletion  Scapular Bone Region  Mild depletion  Dorsal Hand  No depletion  Patellar Region  No depletion  Anterior Thigh Region  No depletion  Posterior Calf Region  No depletion  Edema (RD Assessment)  Moderate  Hair  Reviewed  Eyes  Reviewed  Mouth  Reviewed  Skin  Reviewed  Nails  Reviewed       Diet Order:  Diet Heart Room service appropriate? Yes; Fluid consistency: Thin  EDUCATION NEEDS:   Education needs have been addressed  Skin:  Skin Assessment: Reviewed RN Assessment  Last BM:  04/30/17  Height:   Ht Readings from Last 1 Encounters:  05/01/17 6' (1.829 m)    Weight:   Wt Readings from Last 1 Encounters:  05/01/17 172 lb (78 kg)    Ideal Body Weight:  80.9 kg  BMI:  Body mass index is 23.33 kg/m.  Estimated Nutritional Needs:   Kcal:  2100-2300  Protein:  105-120 grams  Fluid:  2.1-2.3 L    Rmani Kapusta A. Mayford KnifeWilliams, RD, LDN, CDE Pager: [phone] After hours Pager: [phone]

## 2017-05-02 NOTE — Progress Notes (Signed)
ANTICOAGULATION CONSULT NOTE - Initial Consult  Pharmacy Consult for Heparin Indication: atrial fibrillation  No Known Allergies  Patient Measurements: Height: 6' (182.9 cm) Weight: 172 lb (78 kg) IBW/kg (Calculated) : 77.6  Vital Signs: Temp: 98.2 F (36.8 C) (11/14 1339) Temp Source: Axillary (11/14 1130) BP: 153/80 (11/14 1339) Pulse Rate: 151 (11/14 1508)  Labs: Recent Labs    05/01/17 0145 05/01/17 0606 05/01/17 1146 05/01/17 1530 05/01/17 1822 05/02/17 0649 05/02/17 1427  HGB 8.2*  --   --   --   --  7.1* 9.1*  HCT 29.2*  --   --   --   --  25.6* 32.2*  PLT 232  --   --   --   --  223  --   LABPROT  --  13.3  --   --   --   --   --   INR  --  1.02  --   --   --   --   --   CREATININE 1.07  --   --   --   --  0.92  --   TROPONINI  --   --  0.03* <0.03 0.03*  --   --     Estimated Creatinine Clearance: 79.7 mL/min (by C-G formula based on SCr of 0.92 mg/dL).   Medical History: Past Medical History:  Diagnosis Date  . COPD (chronic obstructive pulmonary disease) (HCC)   . Coronary heart disease   . Emphysema of lung (HCC)   . Hyperlipidemia   . Hypertension   . MI (myocardial infarction) (HCC)   . Multiple lung nodules on CT   . Nocturnal hypoxia   . Peripheral arterial disease (HCC)   . Shortness of breath dyspnea    Assessment: 71yom with new onset afib to begin heparin. He is also being followed by GI for anemia/possible GI bleed but his respiratory status is too unstable for any endoscopic intervention right now. Hgb 7.1 > 9.1 s/p PRBCs today. Will dose heparin conservatively and aim for lower goal.   Goal of Therapy:  Heparin level 0.3-0.5 units/ml Monitor platelets by anticoagulation protocol: Yes   Plan:  1) Begin heparin at 1100 units/hr 2) Check 8 hour heparin level 3) Daily heparin level and CBC  Fredrik RiggerMarkle, Rebeka Kimble Sue 05/02/2017,6:25 PM

## 2017-05-02 NOTE — Progress Notes (Signed)
RT called to patient room STAT with BiPAP. Upon arrival, patient receiving breathing treatment, slumped over side of bed, labored. States he is "tired" and will give the BiPAP a try. Placed patient on BiPAP at this time. Tolerating well. RN at bedside.

## 2017-05-02 NOTE — Progress Notes (Signed)
Lactic acid resulted at 2.8. MD paged and made aware. WCTM

## 2017-05-02 NOTE — Progress Notes (Signed)
PROGRESS NOTE  John Marsh ZOX:[id number] DOB: 1945/11/19 DOA: 05/01/2017 PCP: Eartha InchBadger, Michael C, MD  HPI/Recap of past 6324 hours: 71 year old male with medical history significant for COPD with chronic respiratory failure on 3 L oxygen at home, hypertension, hyperlipidemia, CAD, peripheral vascular disease, GERD, tobacco abuse presents with worsening shortness of breath for the past several days.  Patient is currently being managed for COPD exacerbation due to CAP.   This morning, patient noted to be improving overall.  Patient denied any new symptoms, denied chest pain, worsening shortness of breath, fever/chills.  Patient's hemoglobin was noted to be around 7, FOBT negative, no other signs of active bleed.  Discussed the options of transfusing patient due to drop in hemoglobin and severe iron deficiency anemia. GI consulted  Shortly after blood transfusion started, patient was noted to have worsening shortness of breath, severe audible wheezing bilaterally, increased work of breathing, tachypneic 30s, tachycardic 150s, altered mental status.  Rapid response team was consulted immediately. Patient was managed as blood transfusion reaction, the blood transfusion was immediately stopped and patient was given IV Benadryl, IV Lasix, IV Solu-Medrol.  Patient was also given levalbuterol breathing treatments for 30 minutes.  Patient was also placed on BiPAP and respiratory team was consulted.  Patient noted to respond to the above treatments, but heart rate persisted in the 150s.  Due to severe COPD, patient was given 5 mg of IV diltiazem push, without any significant improvement in his heart rate.  EKG done showed patient's in sinus tachy as well as few episodes of possible A. Fib.  Cardiology was consulted.  P patient to be transferred to stepdown unit for closer monitoring.  Assessment/Plan: Principal Problem:   Acute on chronic respiratory failure with hypoxia (HCC) Active Problems:   HLD  (hyperlipidemia)   Essential hypertension   CAD (coronary artery disease), native coronary artery   COPD with acute exacerbation (HCC)   Protein-calorie malnutrition, severe   Lobar pneumonia (HCC)   Microcytic anemia   Respiratory failure (HCC)   #Acute on chronic hypoxic respiratory failure Likely due to pneumonia in a patient with COPD Not improving, likely made worse with the blood transfusion reaction Urine streptococcal pneumonia and Legionella all negative Flu PCR, respiratory virus panel all negative Blood culture x2, no growth so far Repeat chest x-ray shows no infiltrates, just emphysematous changes Continue Atrovent and Xopenex nebs Continue home Pulmicort nebulizer, Spiriva inhaler and Singulair Continue Solu-Medrol IV, Mucinex for cough Continue IV Rocephin and azithromycin Continue oxygen, BiPAP as needed Plan to transfer patient to SDU for closer monitoring  #Persistent tachycardia HR 120s-150s EKG shows sinus tachy, in and out of A. Fib S/p IV diltiazem push, with no response Cardiology consulted  #Severe sepsis Likely due to ?CAP Afebrile, leukocytosis, blood pressure stable Lactic acid elevated, will continue to trend Continue management as above  #Microcytic anemia Unclear etiology, no signs of active bleed, FOBT negative History of daily high dose ibuprofen use for chronic pain Hemoglobin dropped from 13.5 in April to 8.3 on admission Iron panel shows severe iron deficiency anemia Hemoglobin further dropped to 7.1 today, transfusion stopped due to possible transfusion reaction Held home Plavix, ibuprofen, aspirin, Lovenox for now GI consult placed Daily CBC  #Possible blood transfusion reaction History as above Status post IV Benadryl, IV Lasix, IV Solu-Medrol Monitor closely  #Hypertension BP stable IV hydralazine as needed  #Hyperlipidemia Continue Crestor  #History of CAD Held Plavix, aspirin due to acute drop in hemoglobin Continue  Crestor  Code Status: Full  Family Communication: Wife at bedside  Disposition Plan: Home once stable   Consultants:  Cardiology  Procedures:  Blood transfusion on 05/02/17, stopped due to possible reaction  Antimicrobials:  IV ceftriaxone  IV azithromycin  DVT prophylaxis: IV heparin   Objective: Vitals:   05/02/17 1130 05/02/17 1326 05/02/17 1339 05/02/17 1508  BP: (!) 145/93  (!) 153/80   Pulse: (!) 128  (!) 141 (!) 151  Resp: 18  (!) 32 (!) 33  Temp: 97.7 F (36.5 C)  98.2 F (36.8 C)   TempSrc: Axillary     SpO2: 100% 100% 100% 100%  Weight:      Height:        Intake/Output Summary (Last 24 hours) at 05/02/2017 1820 Last data filed at 05/02/2017 1614 Gross per 24 hour  Intake 30 ml  Output 4320 ml  Net -4290 ml   Filed Weights   05/01/17 0144  Weight: 78 kg (172 lb)    Exam:   General: Alert, moderate distress, awake, agitated  Cardiovascular: Tachycardic, S1-S2 present, no added heart sounds  Respiratory: Bilateral audible wheezing, decreased air entry bilaterally  Abdomen: Soft, nontender, nondistended, bowel sounds present  Musculoskeletal: No pedal edema bilaterally  Skin: Normal  Psychiatry: Agitated, in moderate distress   Data Reviewed: CBC: Recent Labs  Lab 05/01/17 0145 05/02/17 0649 05/02/17 1427  WBC 7.7 13.1*  --   NEUTROABS 5.7 11.9*  --   HGB 8.2* 7.1* 9.1*  HCT 29.2* 25.6* 32.2*  MCV 73.4* 74.0*  --   PLT 232 223  --    Basic Metabolic Panel: Recent Labs  Lab 05/01/17 0145 05/02/17 0649  NA 136 140  K 3.8 4.9  CL 99* 106  CO2 29 28  GLUCOSE 305* 198*  BUN 21* 20  CREATININE 1.07 0.92  CALCIUM 8.7* 8.5*   GFR: Estimated Creatinine Clearance: 79.7 mL/min (by C-G formula based on SCr of 0.92 mg/dL). Liver Function Tests: No results for input(s): AST, ALT, ALKPHOS, BILITOT, PROT, ALBUMIN in the last 168 hours. No results for input(s): LIPASE, AMYLASE in the last 168 hours. No results for  input(s): AMMONIA in the last 168 hours. Coagulation Profile: Recent Labs  Lab 05/01/17 0606  INR 1.02   Cardiac Enzymes: Recent Labs  Lab 05/01/17 1146 05/01/17 1530 05/01/17 1822  TROPONINI 0.03* <0.03 0.03*   BNP (last 3 results) No results for input(s): PROBNP in the last 8760 hours. HbA1C: No results for input(s): HGBA1C in the last 72 hours. CBG: No results for input(s): GLUCAP in the last 168 hours. Lipid Profile: No results for input(s): CHOL, HDL, LDLCALC, TRIG, CHOLHDL, LDLDIRECT in the last 72 hours. Thyroid Function Tests: No results for input(s): TSH, T4TOTAL, FREET4, T3FREE, THYROIDAB in the last 72 hours. Anemia Panel: Recent Labs    05/01/17 0606  VITAMINB12 744  FOLATE 20.7  FERRITIN 3*  TIBC 490*  IRON 10*  RETICCTPCT 1.9   Urine analysis:    Component Value Date/Time   COLORURINE YELLOW 05/02/2017 1427   APPEARANCEUR CLEAR 05/02/2017 1427   LABSPEC 1.010 05/02/2017 1427   PHURINE 6.5 05/02/2017 1427   GLUCOSEU 100 (A) 05/02/2017 1427   HGBUR MODERATE (A) 05/02/2017 1427   BILIRUBINUR NEGATIVE 05/02/2017 1427   KETONESUR NEGATIVE 05/02/2017 1427   PROTEINUR NEGATIVE 05/02/2017 1427   UROBILINOGEN 0.2 08/08/2012 1910   NITRITE NEGATIVE 05/02/2017 1427   LEUKOCYTESUR NEGATIVE 05/02/2017 1427   Sepsis Labs: @LABRCNTIP (procalcitonin:4,lacticidven:4)  ) Recent Results (from the past  240 hour(s))  Culture, blood (x 2)     Status: None (Preliminary result)   Collection Time: 05/01/17  5:45 AM  Result Value Ref Range Status   Specimen Description BLOOD RIGHT ARM  Final   Special Requests   Final    BOTTLES DRAWN AEROBIC AND ANAEROBIC Blood Culture results may not be optimal due to an excessive volume of blood received in culture bottles PATIENT ON FOLLOWING ZITHROMAX 500MG  ROCEPHIN 1GM   Culture NO GROWTH 1 DAY  Final   Report Status PENDING  Incomplete  Culture, blood (x 2)     Status: None (Preliminary result)   Collection Time: 05/01/17   6:00 AM  Result Value Ref Range Status   Specimen Description BLOOD RIGHT HAND  Final   Special Requests   Final    IN PEDIATRIC BOTTLE Blood Culture results may not be optimal due to an excessive volume of blood received in culture bottles PATIENT ON FOLLOWING ZITHROMAX 500MG  ROCEPHIN 1GM   Culture NO GROWTH 1 DAY  Final   Report Status PENDING  Incomplete  Respiratory Panel by PCR     Status: None   Collection Time: 05/01/17 12:08 PM  Result Value Ref Range Status   Adenovirus NOT DETECTED NOT DETECTED Final   Coronavirus 229E NOT DETECTED NOT DETECTED Final   Coronavirus HKU1 NOT DETECTED NOT DETECTED Final   Coronavirus NL63 NOT DETECTED NOT DETECTED Final   Coronavirus OC43 NOT DETECTED NOT DETECTED Final   Metapneumovirus NOT DETECTED NOT DETECTED Final   Rhinovirus / Enterovirus NOT DETECTED NOT DETECTED Final   Influenza A NOT DETECTED NOT DETECTED Final   Influenza B NOT DETECTED NOT DETECTED Final   Parainfluenza Virus 1 NOT DETECTED NOT DETECTED Final   Parainfluenza Virus 2 NOT DETECTED NOT DETECTED Final   Parainfluenza Virus 3 NOT DETECTED NOT DETECTED Final   Parainfluenza Virus 4 NOT DETECTED NOT DETECTED Final   Respiratory Syncytial Virus NOT DETECTED NOT DETECTED Final   Bordetella pertussis NOT DETECTED NOT DETECTED Final   Chlamydophila pneumoniae NOT DETECTED NOT DETECTED Final   Mycoplasma pneumoniae NOT DETECTED NOT DETECTED Final      Studies: Dg Chest Port 1 View  Result Date: 05/02/2017 CLINICAL DATA:  Shortness of Breath EXAM: PORTABLE CHEST 1 VIEW COMPARISON:  Chest CT May 01, 2017 and chest radiograph May 01, 2017 FINDINGS: There is underlying emphysematous change with bullous disease in the upper lobes. There is a small calcified granuloma in the left upper lobe. There is no edema or consolidation. Heart is mildly enlarged. The pulmonary vascularity reflects the underlying emphysematous change with decreased vascularity to the upper lobes. No  adenopathy. No bone lesions. IMPRESSION: Underlying emphysematous change. No edema or consolidation. Stable cardiac silhouette with heart mildly enlarged given degree of underlying emphysema. Small calcified granuloma left upper lobe. Emphysema (ICD10-J43.9). Electronically Signed   By: Bretta Bang III M.D.   On: 05/02/2017 14:40    Scheduled Meds: . budesonide  0.5 mg Nebulization BID  . diphenhydrAMINE      . feeding supplement (ENSURE ENLIVE)  237 mL Oral TID BM  . furosemide      . ipratropium  0.5 mg Nebulization Q6H  . levalbuterol      . levalbuterol      . levalbuterol  1.25 mg Nebulization Q6H  . methylPREDNISolone (SOLU-MEDROL) injection  60 mg Intravenous TID  . methylPREDNISolone sodium succinate      . montelukast  4 mg Oral QHS  .  nicotine  21 mg Transdermal Daily  . pantoprazole  40 mg Oral Daily  . pramipexole  0.125 mg Oral QHS  . rosuvastatin  20 mg Oral QHS  . tiotropium  18 mcg Inhalation Daily    Continuous Infusions: . sodium chloride 100 mL/hr at 05/01/17 0945  . azithromycin Stopped (05/02/17 0030)  . cefTRIAXone (ROCEPHIN)  IV Stopped (05/01/17 2330)  . diltiazem (CARDIZEM) infusion       LOS: 1 day     Briant Cedar, MD [facility]  If 7PM-7AM, please contact night-coverage [email or web address] Password [email or web address] 05/02/2017, 6:21 PM

## 2017-05-02 NOTE — Progress Notes (Signed)
Patient began having labored breathing with stridor. Very confused. Rash developed on back, RR increased to 32 and PR increased from 120's to the 150's. Dr. Jeanene Erballed and rapid response nurse called. Suspected reaction to the blood infusion. Blood stopped at 1339 and medications given.Placed on BIPAP for a short time. Mentation improved back to baseline. Breathing better but HR remains in 150's. Dr. Luberta RobertsonPaged to notify. Will continue to monitor.

## 2017-05-02 NOTE — Consult Note (Signed)
Referring Provider:  Dr. Dorothea GlassmanEzendula Primary Care Physician:  Eartha InchBadger, Michael C, MD Primary Gastroenterologist:  unassigned  Reason for Consultation:  Anemia/possible GI bleed  HPI: John HeckHugh T Marsh is a 71 y.o. male with past medical history of COPD, chronic respiratory failure on 3 L oxygen, history of coronary artery disease, peripheral vascular disease admitted to the hospital for further evaluation of shortness of breath.upon initial evaluation she was found to have anemia with hemoglobin of 8.3. Negative FOBT. GI is consulted for further evaluation.   Patient seen and examined at bedside. Upon my initial interview patient was apparently having reaction from his blood transfusion. History obtained with the help of wife. She denied any abdominal pain, blood in the stool or black stool. Denied any GI symptoms at all. They  are more concerned about his breathing at this time.  Patient was seen again  is currently on BiPAP.  Past Medical History:  Diagnosis Date  . COPD (chronic obstructive pulmonary disease) (HCC)   . Coronary heart disease   . Emphysema of lung (HCC)   . Hyperlipidemia   . Hypertension   . MI (myocardial infarction) (HCC)   . Multiple lung nodules on CT   . Nocturnal hypoxia   . Peripheral arterial disease (HCC)   . Shortness of breath dyspnea     Past Surgical History:  Procedure Laterality Date  . CHOLECYSTECTOMY    . NASAL SEPTUM SURGERY    . stent placed      Prior to Admission medications   Medication Sig Start Date End Date Taking? Authorizing Provider  albuterol (PROVENTIL HFA;VENTOLIN HFA) 108 (90 Base) MCG/ACT inhaler Inhale 2 puffs into the lungs every 6 (six) hours as needed for wheezing or shortness of breath. 02/05/17  Yes Roslynn AmbleNestor, Jennings E, MD  albuterol (PROVENTIL) (2.5 MG/3ML) 0.083% nebulizer solution Take 3 mLs (2.5 mg total) by nebulization every 4 (four) hours as needed for wheezing or shortness of breath. 09/29/16  Yes Roslynn AmbleNestor, Jennings E, MD   aspirin EC 81 MG tablet Take 81 mg by mouth daily.   Yes [provider]  budesonide (PULMICORT) 0.5 MG/2ML nebulizer solution Take 2 mLs (0.5 mg total) by nebulization 2 (two) times daily. 03/07/17  Yes Roslynn AmbleNestor, Jennings E, MD  clopidogrel (PLAVIX) 75 MG tablet Take 75 mg by mouth daily.   Yes [provider]  feeding supplement, ENSURE ENLIVE, (ENSURE ENLIVE) LIQD Take 237 mLs by mouth 3 (three) times daily between meals. 09/26/16  Yes Narda BondsNettey, Ralph A, MD  fluticasone (FLONASE) 50 MCG/ACT nasal spray Place 2 sprays into both nostrils daily as needed for allergies or rhinitis. 09/26/16  Yes Narda BondsNettey, Ralph A, MD  formoterol (PERFOROMIST) 20 MCG/2ML nebulizer solution Take 2 mLs (20 mcg total) by nebulization 2 (two) times daily. 02/13/17  Yes Roslynn AmbleNestor, Jennings E, MD  guaiFENesin (MUCINEX) 600 MG 12 hr tablet Take 1 tablet (600 mg total) by mouth 2 (two) times daily. 09/26/16  Yes Narda BondsNettey, Ralph A, MD  ibuprofen (ADVIL,MOTRIN) 600 MG tablet Take 1 tablet (600 mg total) by mouth every 6 (six) hours as needed. Patient taking differently: Take 600 mg by mouth every 6 (six) hours as needed for moderate pain.  07/01/16  Yes Erick BlinksMemon, Jehanzeb, MD  montelukast (SINGULAIR) 5 MG chewable tablet CHEW 1 TABLET BY MOUTH AT BEDTIME 02/01/16  Yes Roslynn AmbleNestor, Jennings E, MD  nicotine (NICODERM CQ - DOSED IN MG/24 HOURS) 21 mg/24hr patch Place 1 patch (21 mg total) onto the skin daily. Patient taking differently:  Place 21 mg daily as needed onto the skin (smoking cesation).  09/27/16  Yes Narda Bonds, MD  nitroGLYCERIN (NITROSTAT) 0.4 MG SL tablet Place 0.4 mg under the tongue every 5 (five) minutes as needed for chest pain. x3 doses as needed for chest pain   Yes [provider]  pantoprazole (PROTONIX) 40 MG tablet Take 1 tablet (40 mg total) by mouth daily. 07/01/16  Yes Erick Blinks, MD  pramipexole (MIRAPEX) 0.125 MG tablet Take 1 tablet by mouth at bedtime. 06/21/16  Yes [provider]   rosuvastatin (CRESTOR) 20 MG tablet Take 20 mg by mouth at bedtime.   Yes [provider]  Spacer/Aero-Holding Chambers (AEROCHAMBER Z-STAT PLUS) inhaler Use as instructed 03/04/15  Yes Roslynn Amble, MD  tiotropium (SPIRIVA) 18 MCG inhalation capsule Place 18 mcg into inhaler and inhale daily.   Yes [provider]  triamcinolone (NASACORT ALLERGY 24HR) 55 MCG/ACT AERO nasal inhaler Place 2 sprays daily as needed into the nose (allergies).    Yes [provider]    Scheduled Meds: . budesonide  0.5 mg Nebulization BID  . diphenhydrAMINE      . diphenhydrAMINE  25 mg Intravenous Once  . feeding supplement (ENSURE ENLIVE)  237 mL Oral TID BM  . furosemide      . furosemide  40 mg Intravenous Once  . ipratropium  0.5 mg Nebulization Q6H  . levalbuterol  1.25 mg Nebulization Q6H  . methylPREDNISolone (SOLU-MEDROL) injection  60 mg Intravenous TID  . methylPREDNISolone (SOLU-MEDROL) injection  60 mg Intravenous Once  . montelukast  4 mg Oral QHS  . nicotine  21 mg Transdermal Daily  . pantoprazole  40 mg Oral Daily  . pramipexole  0.125 mg Oral QHS  . rosuvastatin  20 mg Oral QHS  . tiotropium  18 mcg Inhalation Daily   Continuous Infusions: . sodium chloride 100 mL/hr at 05/01/17 0945  . azithromycin Stopped (05/02/17 0030)  . cefTRIAXone (ROCEPHIN)  IV Stopped (05/01/17 2330)   PRN Meds:.acetaminophen, dextromethorphan-guaiFENesin, fluticasone, hydrALAZINE, nitroGLYCERIN, ondansetron (ZOFRAN) IV, zolpidem  Allergies as of 05/01/2017  . (No Known Allergies)    Family History  Problem Relation Age of Onset  . Heart disease Mother        died at 50  . Heart disease Father   . Heart disease Brother   . Heart disease Sister   . Lung disease Neg Hx     Social History   Socioeconomic History  . Marital status: Married    Spouse name: Not on file  . Number of children: Not on file  . Years of education: Not on file  . Highest education level:  Not on file  Social Needs  . Financial resource strain: Not on file  . Food insecurity - worry: Not on file  . Food insecurity - inability: Not on file  . Transportation needs - medical: Not on file  . Transportation needs - non-medical: Not on file  Occupational History  . Occupation: retired  Tobacco Use  . Smoking status: Current Some Day Smoker    Packs/day: 0.50    Years: 55.00    Pack years: 27.50    Types: Cigarettes    Start date: 08/01/1959  . Smokeless tobacco: Never Used  . Tobacco comment: 1/2 ppd 06/03/15 - peak 3ppd   taking Chantix currently 07-20-2015  Substance and Sexual Activity  . Alcohol use: No    Alcohol/week: 0.0 oz  . Drug use: No  .  Sexual activity: No  Other Topics Concern  . Not on file  Social History Narrative   [place]. Previously [place] lived in [place]. [place]. He serve in [place]. 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 He has been to [place], [place], [place], [place] countries in [place], countries in [place], [place], & multiple [place]. 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 He has a dog, 7 cats, & 2 horses. Remote exposure to a parrot for a few years in a different home. No mold exposure. No hot tub exposure. 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     Review of Systems: Review of Systems  Constitutional: Positive for malaise/fatigue. Negative for chills and fever.  HENT: Negative for hearing loss and tinnitus.   Eyes: Negative for blurred vision and double vision.  Respiratory: Positive for cough and shortness of breath. Negative for hemoptysis and sputum production.   Cardiovascular: Positive for palpitations and orthopnea. Negative for chest pain.  Gastrointestinal: Negative for abdominal pain, blood in stool, constipation, diarrhea, heartburn, melena, nausea and vomiting.  Genitourinary: Negative  for dysuria and urgency.  Skin: Negative for rash.  Neurological: Positive for dizziness and weakness. Negative for focal weakness and seizures.  Psychiatric/Behavioral: Negative for hallucinations and suicidal ideas.    Physical Exam: Vital signs: Vitals:   05/02/17 1326 05/02/17 1339  BP:  (!) 153/80  Pulse:  (!) 141  Resp:  (!) 32  Temp:  98.2 F (36.8 C)  SpO2: 100% 100%   Last BM Date: 04/30/17 General:   Alert, currently in respiratory distress. On BiPAP. HEENT: NS, AT, EOMI  Oral exam not performed as patient is on BiPAP. Lungs:  Decreased breath sounds bilaterally Heart:  Tachycardia, no murmurs Abdomen: Soft, nontender, nondistended, bowel sounds present, no peritoneal signs. LE: no edema. Pulses present Neuro : Alert and oriented 3 Psych : Somewhat anxious. Normal thought content   GI:  Lab Results: Recent Labs    05/01/17 0145 05/02/17 0649  WBC 7.7 13.1*  HGB 8.2* 7.1*  HCT 29.2* 25.6*  PLT 232 223   BMET Recent Labs    05/01/17 0145 05/02/17 0649  NA 136 140  K 3.8 4.9  CL 99* 106  CO2 29 28  GLUCOSE 305* 198*  BUN 21* 20  CREATININE 1.07 0.92  CALCIUM 8.7* 8.5*   LFT No results for input(s): PROT, ALBUMIN, AST, ALT, ALKPHOS, BILITOT, BILIDIR, IBILI in the last 72 hours. PT/INR Recent Labs    05/01/17 0606  LABPROT 13.3  INR 1.02     Studies/Results: Ct Angio Chest Pe W Or Wo Contrast  Result Date: 05/01/2017 CLINICAL DATA:  Evaluate for acute pulmonary embolus. Shortness of breath. EXAM: CT ANGIOGRAPHY CHEST WITH CONTRAST TECHNIQUE: Multidetector CT imaging of the chest was performed using the standard protocol during bolus administration of intravenous contrast. Multiplanar CT image reconstructions and MIPs were obtained to evaluate the vascular anatomy. CONTRAST:  100mL ISOVUE-370 IOPAMIDOL (ISOVUE-370) INJECTION 76% COMPARISON:  02/01/2017 FINDINGS: Cardiovascular: Satisfactory opacification of the pulmonary arteries to the  segmental level. No evidence of pulmonary embolism. Normal heart size. No pericardial effusion. Aortic atherosclerosis. LAD coronary artery calcifications noted. Mediastinum/Nodes: The trachea is patent and midline. Normal appearance of the esophagus. No mediastinal or hilar adenopathy. Lungs/Pleura: No pleural effusion. Calcified granuloma identified in the left upper lobe. Moderate to advanced changes  of centrilobular and paraseptal emphysema. No airspace consolidation. Scarring and architectural distortion within the right upper lobe is again noted compatible with resolving inflammation/infection. Pulmonary nodule within the superior segment of the left lower lobe measures 4 mm, image 72 of series 6. Decreased from 7 mm previously. Right lower lobe pulmonary nodule is identified measuring 7 mm, image 85 of series 6. New from comparison exam. Upper Abdomen: No acute abnormality. Musculoskeletal: There is degenerative disc disease noted within the thoracic spine. No aggressive lytic or sclerotic bone lesions. Review of the MIP images confirms the above findings. IMPRESSION: 1. No evidence for acute pulmonary embolus. 2. Aortic Atherosclerosis (ICD10-I70.0) and Emphysema (ICD10-J43.9). Lad coronary artery calcifications noted. 3. Right lower lobe pulmonary nodule is new from comparison exam measuring 7 mm. Non-contrast chest CT at 6-12 months is recommended. If the nodule is stable at time of repeat CT, then future CT at 18-24 months (from today's scan) is considered optional for low-risk patients, but is recommended for high-risk patients. This recommendation follows the consensus statement: Guidelines for Management of Incidental Pulmonary Nodules Detected on CT Images: From the Fleischner Society 2017; Radiology 2017; 284:228-243. Electronically Signed   By: Signa Kell M.D.   On: 05/01/2017 17:40   Dg Chest Portable 1 View  Result Date: 05/01/2017 CLINICAL DATA:  Acute onset of worsening shortness of  breath. EXAM: PORTABLE CHEST 1 VIEW COMPARISON:  Chest radiograph performed 09/21/2016, and CT of the chest performed 02/01/2017 FINDINGS: The lungs are hyperexpanded, with flattening of the hemidiaphragms compatible with COPD. Scarring is noted at the right lung apex. Mild right basilar airspace opacity could reflect mild pneumonia. There is no evidence of pleural effusion or pneumothorax. The cardiomediastinal silhouette is normal in size. No acute osseous abnormalities are identified. IMPRESSION: 1. Mild right basilar airspace opacity could reflect mild pneumonia. 2. Findings of COPD, with scarring at the right lung apex. Electronically Signed   By: Roanna Raider M.D.   On: 05/01/2017 02:21    Impression/Plan: - Significant drop in hemoglobin without any overt bleeding. Occult blood negative. - Respiratory failure - Blood transfusion reaction - History of coronary artery disease. History of COPD  Recommendations -------------------------- - Patient is currently not stable from a respiratory standpoint for any endoscopic intervention. Detailed discussion with family. - Patient may benefit from CT abdomen to rule out retroperitoneal bleed once stable from a respiratory standpoint. - Monitor H&H. - GI will follow    LOS: 1 day   Kathi Der  MD, FACP 05/02/2017, 2:27 PM  Contact #  [phone]

## 2017-05-02 NOTE — Progress Notes (Signed)
CRITICAL LAB VALUE  LACTIC ACID: 2.5 On call NP notified. Awaiting orders.  Veatrice KellsMahmoud,Ayden Hardwick I, RN

## 2017-05-02 NOTE — Consult Note (Signed)
The patient has been seen in conjunction with Fatima BlankJeanine Hammond, NP. All aspects of care have been considered and discussed. The patient has been personally interviewed, examined, and all clinical data has been reviewed.   Atrial fibrillation with rapid ventricular response in the setting of sepsis and respiratory failure.  Chads VASC is 3 or greater.  Recommend treating underlying infectious process and respiratory insufficiency.  IV diltiazem for rate control, IV heparin to prevent stroke, and if no success with diltiazem for rate control we may need to escalate amiodarone IV.  Plan to cycle cardiac enzymes, check BNP, and follow along.   Cardiology Consultation:   Patient ID: John HeckHugh T Pro; [id number]; 07-08-1945   Admit date: 05/01/2017 Date of Consult: 05/02/2017  Primary Care Provider: Eartha InchBadger, Michael C, MD Primary Cardiologist: None recently   Patient Profile:   John Marsh is a 71 y.o. male with a hx of COPD, chronic respiratory failure on 3L home oxygen, HTN, HLD, GERD, PVD, CAD, tobacco use who is being seen today for the evaluation of atrial fibrillation with rapid ventricular respones at the request of Dr. Sharolyn DouglasEzenduka.  History of Present Illness:   Mr. Willa RoughHicks presented [facility] on 05[facility]01[facility]17 with complaints of progressively worsening shortness of breath for several days. He had some mild chest pain that resolved. He had leukocytosis, anemia, tachycardia and tachypnea. Troponin was negative. CXR showed infiltration in the right base. He is being treated for pneumonia, COPD exacerbation and sepsis.   The patient developed respiratory distress this afternoon with respiratory rate increased to 32 and heart rate increased from the 120's to 150's and confusion. He was was thought to have a possible reaction to blood transfusion. He was placed on BiPap and his mentation improved. He had stridorous breath sounds and was given IV lasix. He is significantly better now.   Currently he  is up moving around the room on nasal cannula oxygen. He denies chest pain. He did have some prior to admission and took a SL NTG with relief. He also had mild chest discomfort with his respiratory difficulty this afternoon. He says that he has chronic orthopnea and sleeps almost upright at home. He denies edema.   Pt states that many years ago he was being followed by Dr. Dickie LaBrody who has since retired. He says that he has had several "minor heart attacks" and has had stents. Per office note in 2013, pt had stent in 2007. I am unable to find the cath report. He denies any previous known arrhythmias.   He used to smoke 3PPD but cut down in April to about 1 cigarette per month.   He had a Normal echo in 09/2016.   CTA of the chest yesterday showed no PE, aortic atherosclerosis and LAD coronary artery calcifications.   Past Medical History:  Diagnosis Date  . COPD (chronic obstructive pulmonary disease) (HCC)   . Coronary heart disease   . Emphysema of lung (HCC)   . Hyperlipidemia   . Hypertension   . MI (myocardial infarction) (HCC)   . Multiple lung nodules on CT   . Nocturnal hypoxia   . Peripheral arterial disease (HCC)   . Shortness of breath dyspnea     Past Surgical History:  Procedure Laterality Date  . CHOLECYSTECTOMY    . NASAL SEPTUM SURGERY    . stent placed       Home Medications:  Prior to Admission medications   Medication Sig Start Date End Date Taking? Authorizing Provider  albuterol (PROVENTIL HFA;VENTOLIN HFA) 108 (90 Base) MCG/ACT inhaler Inhale 2 puffs into the lungs every 6 (six) hours as needed for wheezing or shortness of breath. 02/05/17  Yes Roslynn Amble, MD  albuterol (PROVENTIL) (2.5 MG/3ML) 0.083% nebulizer solution Take 3 mLs (2.5 mg total) by nebulization every 4 (four) hours as needed for wheezing or shortness of breath. 09/29/16  Yes Roslynn Amble, MD  aspirin EC 81 MG tablet Take 81 mg by mouth daily.   Yes [provider]    budesonide (PULMICORT) 0.5 MG/2ML nebulizer solution Take 2 mLs (0.5 mg total) by nebulization 2 (two) times daily. 03/07/17  Yes Roslynn Amble, MD  clopidogrel (PLAVIX) 75 MG tablet Take 75 mg by mouth daily.   Yes [provider]  feeding supplement, ENSURE ENLIVE, (ENSURE ENLIVE) LIQD Take 237 mLs by mouth 3 (three) times daily between meals. 09/26/16  Yes Narda Bonds, MD  fluticasone (FLONASE) 50 MCG/ACT nasal spray Place 2 sprays into both nostrils daily as needed for allergies or rhinitis. 09/26/16  Yes Narda Bonds, MD  formoterol (PERFOROMIST) 20 MCG/2ML nebulizer solution Take 2 mLs (20 mcg total) by nebulization 2 (two) times daily. 02/13/17  Yes Roslynn Amble, MD  guaiFENesin (MUCINEX) 600 MG 12 hr tablet Take 1 tablet (600 mg total) by mouth 2 (two) times daily. 09/26/16  Yes Narda Bonds, MD  ibuprofen (ADVIL,MOTRIN) 600 MG tablet Take 1 tablet (600 mg total) by mouth every 6 (six) hours as needed. Patient taking differently: Take 600 mg by mouth every 6 (six) hours as needed for moderate pain.  07/01/16  Yes Erick Blinks, MD  montelukast (SINGULAIR) 5 MG chewable tablet CHEW 1 TABLET BY MOUTH AT BEDTIME 02/01/16  Yes Roslynn Amble, MD  nicotine (NICODERM CQ - DOSED IN MG/24 HOURS) 21 mg/24hr patch Place 1 patch (21 mg total) onto the skin daily. Patient taking differently: Place 21 mg daily as needed onto the skin (smoking cesation).  09/27/16  Yes Narda Bonds, MD  nitroGLYCERIN (NITROSTAT) 0.4 MG SL tablet Place 0.4 mg under the tongue every 5 (five) minutes as needed for chest pain. x3 doses as needed for chest pain   Yes [provider]  pantoprazole (PROTONIX) 40 MG tablet Take 1 tablet (40 mg total) by mouth daily. 07/01/16  Yes Erick Blinks, MD  pramipexole (MIRAPEX) 0.125 MG tablet Take 1 tablet by mouth at bedtime. 06/21/16  Yes [provider]  rosuvastatin (CRESTOR) 20 MG tablet Take 20 mg by mouth at bedtime.   Yes [provider]  Spacer/Aero-Holding Chambers (AEROCHAMBER Z-STAT PLUS) inhaler Use as instructed 03/04/15  Yes Roslynn Amble, MD  tiotropium (SPIRIVA) 18 MCG inhalation capsule Place 18 mcg into inhaler and inhale daily.   Yes [provider]  triamcinolone (NASACORT ALLERGY 24HR) 55 MCG/ACT AERO nasal inhaler Place 2 sprays daily as needed into the nose (allergies).    Yes [provider]    Inpatient Medications: Scheduled Meds: . budesonide  0.5 mg Nebulization BID  . diltiazem  5 mg Intravenous Once  . diphenhydrAMINE      . feeding supplement (ENSURE ENLIVE)  237 mL Oral TID BM  . furosemide      . ipratropium  0.5 mg Nebulization Q6H  . levalbuterol      . levalbuterol      . levalbuterol  1.25 mg Nebulization Q6H  . methylPREDNISolone (SOLU-MEDROL) injection  60 mg Intravenous TID  . methylPREDNISolone sodium  succinate      . montelukast  4 mg Oral QHS  . nicotine  21 mg Transdermal Daily  . pantoprazole  40 mg Oral Daily  . pramipexole  0.125 mg Oral QHS  . rosuvastatin  20 mg Oral QHS  . tiotropium  18 mcg Inhalation Daily   Continuous Infusions: . sodium chloride 100 mL/hr at 05/01/17 0945  . azithromycin Stopped (05/02/17 0030)  . cefTRIAXone (ROCEPHIN)  IV Stopped (05/01/17 2330)   PRN Meds: acetaminophen, dextromethorphan-guaiFENesin, fluticasone, hydrALAZINE, nitroGLYCERIN, ondansetron (ZOFRAN) IV, zolpidem  Allergies:   No Known Allergies  Social History:   Social History   Socioeconomic History  . Marital status: Married    Spouse name: Not on file  . Number of children: Not on file  . Years of education: Not on file  . Highest education level: Not on file  Social Needs  . Financial resource strain: Not on file  . Food insecurity - worry: Not on file  . Food insecurity - inability: Not on file  . Transportation needs - medical: Not on file  . Transportation needs - non-medical: Not on file  Occupational History  . Occupation:  retired  Tobacco Use  . Smoking status: Current Some Day Smoker    Packs/day: 0.50    Years: 55.00    Pack years: 27.50    Types: Cigarettes    Start date: 08/01/1959  . Smokeless tobacco: Never Used  . Tobacco comment: 1/2 ppd 06/03/15 - peak 3ppd   taking Chantix currently 07-20-2015  Substance and Sexual Activity  . Alcohol use: No    Alcohol/week: 0.0 oz  . Drug use: No  . Sexual activity: No  Other Topics Concern  . Not on file  Social History Narrative   Originally from [place]. Previously has lived in [place]. He moved to [facility] in 1964. He serve in [place]. 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 He has been to [place], [place], [place], several countries in [place], countries in [place], [place], & multiple [place] Countries. 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 He has a dog, 7 cats, & 2 horses. Remote exposure to a parrot for a few years in a different home. No mold exposure. No hot tub exposure. 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     Family History:    Family History  Problem Relation Age of Onset  . Heart disease Mother        died at 59  . Heart disease Father   . Heart disease Brother   . Heart disease Sister   . Lung disease Neg Hx      ROS:  Please see the history of present illness.  ROS  All other ROS reviewed and negative.     Physical Exam/Data:   Vitals:   05/02/17 1130 05/02/17 1326 05/02/17 1339 05/02/17 1508  BP: (!) 145/93  (!) 153/80   Pulse: (!) 128  (!) 141 (!) 151  Resp: 18  (!) 32 (!) 33  Temp: 97.7 F (36.5 C)  98.2 F (36.8 C)   TempSrc: Axillary     SpO2: 100% 100% 100% 100%  Weight:      Height:        Intake/Output Summary (Last 24 hours) at 05/02/2017 1638 Last data filed at 05/02/2017 1614 Gross per 24 hour  Intake 30 ml  Output 4320 ml  Net -4290 ml   Filed Weights   05/01/17 0144  Weight: 172 lb (78 kg)    Body mass index is 23.33 kg/m.  General:  Thin male, in no acute distress HEENT: normal Lymph: no adenopathy Neck: no JVD Endocrine:  No thryomegaly Vascular: No carotid bruits; FA pulses 2+ bilaterally without bruits  Cardiac:  normal S1, S2; Irregularly irregular rhythm, fast rate.  Lungs:  clear to auscultation bilaterally, but very diminished air movement Abd: soft, nontender, no hepatomegaly  Ext: no edema Musculoskeletal:  No deformities, BUE and BLE strength normal and equal Skin: warm and dry  Neuro:  CNs 2-12 intact, no focal abnormalities noted Psych:  Normal affect   EKG:  The EKG was personally reviewed and demonstrates:  Narrow complex tachycardia, irregular, likely afib, 126 bpm Telemetry:  Telemetry was personally reviewed and demonstrates:  afib with rates in the 130's-150's  Relevant CV Studies:  Echocardiogram 09/21/2016 Study Conclusions  - Left ventricle: The cavity size was normal. Systolic function was   normal. The estimated ejection fraction was in the range of 60%   to 65%. Wall motion was normal; there were no regional wall   motion abnormalities.  Laboratory Data:  Chemistry Recent Labs  Lab 05/01/17 0145 05/02/17 0649  NA 136 140  K 3.8 4.9  CL 99* 106  CO2 29 28  GLUCOSE 305* 198*  BUN 21* 20  CREATININE 1.07 0.92  CALCIUM 8.7* 8.5*  GFRNONAA >60 >60  GFRAA >60 >60  ANIONGAP 8 6    No results for input(s): PROT, ALBUMIN, AST, ALT, ALKPHOS, BILITOT in the last 168 hours. Hematology Recent Labs  Lab 05/01/17 0145 05/01/17 0606 05/02/17 0649 05/02/17 1427  WBC 7.7  --  13.1*  --   RBC 3.98* 3.76* 3.46*  --   HGB 8.2*  --  7.1* 9.1*  HCT 29.2*  --  25.6* 32.2*  MCV 73.4*  --  74.0*  --   MCH 20.6*  --  20.5*  --   MCHC 28.1*  --  27.7*  --   RDW 16.6*  --  17.2*  --   PLT 232  --  223  --    Cardiac Enzymes Recent Labs  Lab 05/01/17 1146 05/01/17 1530 05/01/17 1822  TROPONINI 0.03* <0.03 0.03*    Recent Labs  Lab  05/01/17 0214  TROPIPOC 0.02    BNPNo results for input(s): BNP, PROBNP in the last 168 hours.  DDimer  Recent Labs  Lab 05/01/17 1146  DDIMER 0.33    Radiology/Studies:  Ct Angio Chest Pe W Or Wo Contrast  Result Date: 05/01/2017 CLINICAL DATA:  Evaluate for acute pulmonary embolus. Shortness of breath. EXAM: CT ANGIOGRAPHY CHEST WITH CONTRAST TECHNIQUE: Multidetector CT imaging of the chest was performed using the standard protocol during bolus administration of intravenous contrast. Multiplanar CT image reconstructions and MIPs were obtained to evaluate the vascular anatomy. CONTRAST:  ISOVUE-370 IOPAMIDOL (ISOVUE-370) INJECTION 76% COMPARISON:  02/01/2017 FINDINGS: Cardiovascular: Satisfactory opacification of the pulmonary arteries to the segmental level. No evidence of pulmonary embolism. Normal heart size. No pericardial effusion. Aortic atherosclerosis. LAD coronary artery calcifications noted. Mediastinum/Nodes: The trachea is patent and midline. Normal appearance of the esophagus. No mediastinal or hilar adenopathy. Lungs/Pleura: No pleural effusion. Calcified granuloma identified in the left upper lobe. Moderate to advanced changes of centrilobular and paraseptal emphysema. No airspace consolidation. Scarring and architectural distortion within the right upper lobe is again noted compatible with resolving inflammation/infection. Pulmonary nodule  within the superior segment of the left lower lobe measures 4 mm, image 72 of series 6. Decreased from 7 mm previously. Right lower lobe pulmonary nodule is identified measuring 7 mm, image 85 of series 6. New from comparison exam. Upper Abdomen: No acute abnormality. Musculoskeletal: There is degenerative disc disease noted within the thoracic spine. No aggressive lytic or sclerotic bone lesions. Review of the MIP images confirms the above findings. IMPRESSION: 1. No evidence for acute pulmonary embolus. 2. Aortic Atherosclerosis  (ICD10-I70.0) and Emphysema (ICD10-J43.9). Lad coronary artery calcifications noted. 3. Right lower lobe pulmonary nodule is new from comparison exam measuring 7 mm. Non-contrast chest CT at 6-12 months is recommended. If the nodule is stable at time of repeat CT, then future CT at 18-24 months (from today's scan) is considered optional for low-risk patients, but is recommended for high-risk patients. This recommendation follows the consensus statement: Guidelines for Management of Incidental Pulmonary Nodules Detected on CT Images: From the Fleischner Society 2017; Radiology 2017; 284:228-243. Electronically Signed   By: Signa Kellaylor  Stroud M.D.   On: 05/01/2017 17:40   Dg Chest Port 1 View  Result Date: 05/02/2017 CLINICAL DATA:  Shortness of Breath EXAM: PORTABLE CHEST 1 VIEW COMPARISON:  Chest CT May 01, 2017 and chest radiograph May 01, 2017 FINDINGS: There is underlying emphysematous change with bullous disease in the upper lobes. There is a small calcified granuloma in the left upper lobe. There is no edema or consolidation. Heart is mildly enlarged. The pulmonary vascularity reflects the underlying emphysematous change with decreased vascularity to the upper lobes. No adenopathy. No bone lesions. IMPRESSION: Underlying emphysematous change. No edema or consolidation. Stable cardiac silhouette with heart mildly enlarged given degree of underlying emphysema. Small calcified granuloma left upper lobe. Emphysema (ICD10-J43.9). Electronically Signed   By: Bretta BangWilliam  Woodruff III M.D.   On: 05/02/2017 14:40   Dg Chest Portable 1 View  Result Date: 05/01/2017 CLINICAL DATA:  Acute onset of worsening shortness of breath. EXAM: PORTABLE CHEST 1 VIEW COMPARISON:  Chest radiograph performed 09/21/2016, and CT of the chest performed 02/01/2017 FINDINGS: The lungs are hyperexpanded, with flattening of the hemidiaphragms compatible with COPD. Scarring is noted at the right lung apex. Mild right basilar  airspace opacity could reflect mild pneumonia. There is no evidence of pleural effusion or pneumothorax. The cardiomediastinal silhouette is normal in size. No acute osseous abnormalities are identified. IMPRESSION: 1. Mild right basilar airspace opacity could reflect mild pneumonia. 2. Findings of COPD, with scarring at the right lung apex. Electronically Signed   By: Roanna RaiderJeffery  Chang M.D.   On: 05/01/2017 02:21    Assessment and Plan:   Atrial fibrillation with RVR: In setting of sepsis and respiratory failure. No previous known hx of afib. Was admitted in sinus tach 140 bpm on admission. Today has been in atrial fib in the 120's. Today pt developed respiratory distress thought to be related to a blood transfusion. His heart rates went up to the 150's. He has been given cardizem 5 mg IV with HR continued in the 140's. Pt is alert with stable BP. Respiratory status improved. He had normal LV function in 09/2016. Pt has not been on BB, likely due to severe COPD. Will start cardizem drip. May need amiodarone if heart rate does not improve.  CHA2DS2/VAS Stroke Risk Score is at least 3 (HTN, age, CAD). He will need anticoagulation for stroke risk reduction. Start heparin now. Pt has normal renal function. Recommend DOAC prior to discharge.  CAD: Pt reports hx of several "minor heart attacks" and stents in the past (2007). Pt is treated with aspirin 81 mg and Plavix 75 mg, statin. Pt had mild brief non descript chest discomfort prior to arrival and chest discomfort with respiratory difficulty today. troponins negative on 05/01/17. Will cycle troponins tonight.   Sepsis:  likely due to pneumonia. Leukocytosis and CXR with infiltrate.  Management per IM on IV antibiotics, nebs and IV fluids.   Acute exacerbation of COPD: IV antibiotics, steroids and nebs. Hx of severe COPD followed outpatient by Dr. Jamison Neighbor of pulmonology. Prior 3ppd smoker.   Hyperlipidemia: On Crestor 20 mg. LDL 57 in 03/2016.   Anemia:  Hgb 8.3 on admission, down from 13.5 in April. Workup by IM. Pt had respiratory distress with transfusion this afternoon, which was stopped. FOBT negative. No identified source of blood loss.   For questions or updates, please contact [facility] Please consult [email or web address] for contact info under Cardiology/STEMI.   Signed, Berton Bon, NP  05/02/2017 4:38 PM

## 2017-05-02 NOTE — Significant Event (Signed)
Rapid Response Event Note  Overview:  Called by RN for sudden confusion, increased HR Time Called: 1343 Arrival Time: 1345 Event Type: Other (Comment)  Initial Focused Assessment:  Called by RN for patient who is receiving blood and has suddenly developed confusion, increased HR & RR and red rash on back.  Recommend pausing blood.  On my arrival to patients room, RN and wife at bedside.  Patient sitting on side of bed in tripod position, confused, red rash noted across back, HR 146, BP 153/80, RR 32, 100% on 3 LPM. Patient denies CP or back or SOB   Interventions:  MD paged.  Breath Sounds expiratory wheeze throughout.  MD at bedside.  Benadryl ordered. Orders to be followed through by RN.  xopenex treatment given, lasix, PCXR.  Patient still with Respiratory distress and c/o being tired.  RT called for BIPAP.  Patietn placed on BIPAP and tolerating well  Plan of Care (if not transferred): RRT sat with patient until 1615, patietn came off BIPAP at 1600 and put on nasal cannula at 3LPM.  RN to monitor and call if assistance needed  Event Summary:   at      at          [facility]fe, Maryagnes Amosenise Ann

## 2017-05-03 ENCOUNTER — Inpatient Hospital Stay (HOSPITAL_COMMUNITY): Payer: Medicare Other

## 2017-05-03 DIAGNOSIS — I480 Paroxysmal atrial fibrillation: Secondary | ICD-10-CM

## 2017-05-03 LAB — HEPARIN LEVEL (UNFRACTIONATED)
HEPARIN UNFRACTIONATED: 0.67 [IU]/mL (ref 0.30–0.70)
HEPARIN UNFRACTIONATED: 0.47 [IU]/mL (ref 0.30–0.70)
Heparin Unfractionated: 0.66 IU/mL (ref 0.30–0.70)

## 2017-05-03 LAB — TRANSFUSION REACTION
DAT C3: NEGATIVE
POST RXN DAT IGG: NEGATIVE

## 2017-05-03 LAB — CBC WITH DIFFERENTIAL/PLATELET
BASOS PCT: 0 %
Basophils Absolute: 0 10*3/uL (ref 0.0–0.1)
EOS ABS: 0 10*3/uL (ref 0.0–0.7)
EOS PCT: 0 %
HEMATOCRIT: 28.7 % — AB (ref 39.0–52.0)
HEMOGLOBIN: 8.3 g/dL — AB (ref 13.0–17.0)
LYMPHS PCT: 4 %
Lymphs Abs: 0.6 10*3/uL — ABNORMAL LOW (ref 0.7–4.0)
MCH: 21.2 pg — AB (ref 26.0–34.0)
MCHC: 28.9 g/dL — AB (ref 30.0–36.0)
MCV: 73.4 fL — ABNORMAL LOW (ref 78.0–100.0)
MONOS PCT: 3 %
Monocytes Absolute: 0.4 10*3/uL (ref 0.1–1.0)
NEUTROS ABS: 12.9 10*3/uL — AB (ref 1.7–7.7)
NEUTROS PCT: 93 %
Platelets: 231 10*3/uL (ref 150–400)
RBC: 3.91 MIL/uL — ABNORMAL LOW (ref 4.22–5.81)
RDW: 17 % — ABNORMAL HIGH (ref 11.5–15.5)
WBC: 13.9 10*3/uL — ABNORMAL HIGH (ref 4.0–10.5)

## 2017-05-03 LAB — BASIC METABOLIC PANEL
Anion gap: 10 (ref 5–15)
BUN: 22 mg/dL — AB (ref 6–20)
CHLORIDE: 100 mmol/L — AB (ref 101–111)
CO2: 29 mmol/L (ref 22–32)
CREATININE: 1.05 mg/dL (ref 0.61–1.24)
Calcium: 8.7 mg/dL — ABNORMAL LOW (ref 8.9–10.3)
GFR calc Af Amer: >60 mL/min (ref >60)
GFR calc non Af Amer: >60 mL/min (ref >60)
Glucose, Bld: 237 mg/dL — ABNORMAL HIGH (ref 65–99)
POTASSIUM: 3.8 mmol/L (ref 3.5–5.1)
SODIUM: 139 mmol/L (ref 135–145)

## 2017-05-03 LAB — TROPONIN I
TROPONIN I: 0.12 ng/mL — AB (ref <0.03)
TROPONIN I: 0.14 ng/mL — AB (ref <0.03)

## 2017-05-03 MED ORDER — IOPAMIDOL (ISOVUE-300) INJECTION 61%
INTRAVENOUS | Status: AC
  Administered 2017-05-03: 100 mL
  Filled 2017-05-03: qty 100

## 2017-05-03 MED ORDER — METHYLPREDNISOLONE SODIUM SUCC 125 MG IJ SOLR
60.0000 mg | Freq: Two times a day (BID) | INTRAMUSCULAR | Status: DC
  Administered 2017-05-03 – 2017-05-05 (×4): 60 mg via INTRAVENOUS
  Filled 2017-05-03 (×4): qty 2

## 2017-05-03 MED ORDER — ALBUTEROL SULFATE (2.5 MG/3ML) 0.083% IN NEBU
2.5000 mg | INHALATION_SOLUTION | RESPIRATORY_TRACT | Status: DC | PRN
  Administered 2017-05-03: 2.5 mg via RESPIRATORY_TRACT
  Filled 2017-05-03: qty 3

## 2017-05-03 MED ORDER — IOPAMIDOL (ISOVUE-300) INJECTION 61%
INTRAVENOUS | Status: AC
  Filled 2017-05-03: qty 30

## 2017-05-03 MED ORDER — SALINE SPRAY 0.65 % NA SOLN
1.0000 | NASAL | Status: DC | PRN
  Administered 2017-05-03: 1 via NASAL
  Filled 2017-05-03: qty 44

## 2017-05-03 MED ORDER — MUSCLE RUB 10-15 % EX CREA
1.0000 "application " | TOPICAL_CREAM | CUTANEOUS | Status: DC | PRN
  Administered 2017-05-03: 1 via TOPICAL
  Filled 2017-05-03: qty 85

## 2017-05-03 MED ORDER — ALBUTEROL SULFATE (2.5 MG/3ML) 0.083% IN NEBU
3.0000 mL | INHALATION_SOLUTION | Freq: Four times a day (QID) | RESPIRATORY_TRACT | Status: DC | PRN
  Administered 2017-05-04: 3 mL via RESPIRATORY_TRACT
  Filled 2017-05-03 (×2): qty 3

## 2017-05-03 MED ORDER — ALBUTEROL SULFATE (2.5 MG/3ML) 0.083% IN NEBU: 3.0000 mL | INHALATION_SOLUTION | RESPIRATORY_TRACT | Status: DC | PRN

## 2017-05-03 NOTE — Progress Notes (Signed)
ANTICOAGULATION CONSULT NOTE - Follow Up Consult  Pharmacy Consult for Heparin Indication: atrial fibrillation  No Known Allergies  Patient Measurements: Height: 6' (182.9 cm) Weight: 172 lb (78 kg) IBW/kg (Calculated) : 77.6 Heparin Dosing Weight: 78 kg  Vital Signs: Temp: 97.6 F (36.4 C) (11/15 1125) Temp Source: Oral (11/15 1125) BP: 141/54 (11/15 1125) Pulse Rate: 84 (11/15 1401)  Labs: Recent Labs    05/01/17 0145 05/01/17 0606  05/02/17 0649 05/02/17 1427 05/02/17 1850 05/03/17 0044 05/03/17 0410 05/03/17 1427  HGB 8.2*  --   --  7.1* 9.1*  --   --  8.3*  --   HCT 29.2*  --   --  25.6* 32.2*  --   --  28.7*  --   PLT 232  --   --  223  --   --   --  231  --   LABPROT  --  13.3  --   --   --   --   --   --   --   INR  --  1.02  --   --   --   --   --   --   --   HEPARINUNFRC  --   --   --   --   --   --   --  0.66 0.67  CREATININE 1.07  --   --  0.92  --   --   --  1.05  --   TROPONINI  --   --    < >  --   --  0.14* 0.12* 0.14*  --    < > = values in this interval not displayed.    Estimated Creatinine Clearance: 69.8 mL/min (by C-G formula based on SCr of 1.05 mg/dL).   Assessment:  Anticoag: heparin for new afib, GI following for possible GIB - resp status too unstable for endoscopic intervention right now, will dose heparin conservatively (no bolus, lower goal), Hgb 8.3 s/p transfusion. Plts stable. CHA2DS2/VAS score of 3. HL 0.66>0.67 despite rate decrease this AM.  Goal of Therapy:  Heparin level 0.3-0.5 units/ml Monitor platelets by anticoagulation protocol: Yes   Plan:  IV heparin decrease to 850 units/hr Daily heparin level and CBC CT abd/pelvis to evaluate for retroperitoneal bleed. MD sticky note about Atrovent/Spiriva duplication  Temitope Griffing S. Merilynn Finlandobertson, PharmD, BCPS Clinical Staff Pharmacist Pager [phone]  Misty Stanleyobertson, Reizy Dunlow Stillinger 05/03/2017,3:51 PM

## 2017-05-03 NOTE — Progress Notes (Signed)
PROGRESS NOTE  John Marsh ZOX:[id number] DOB: 03-Nov-1945 DOA: 05/01/2017 PCP: Eartha Inch, MD  HPI/Recap of past 60 hours: 71 year old male with medical history significant for COPD with chronic respiratory failure on 3 L oxygen at home, hypertension, hyperlipidemia, CAD, peripheral vascular disease, GERD, tobacco abuse presents with worsening shortness of breath for the past several days.  Patient is currently being managed for COPD exacerbation due to CAP.   This morning, patient noted to be improving overall.  Patient denied any new symptoms, denied chest pain, worsening shortness of breath, fever/chills, nausea/vomiting, dizziness.   Assessment/Plan: Principal Problem:   Acute on chronic respiratory failure with hypoxia (HCC) Active Problems:   HLD (hyperlipidemia)   Essential hypertension   CAD (coronary artery disease), native coronary artery   COPD with acute exacerbation (HCC)   Protein-calorie malnutrition, severe   Lobar pneumonia (HCC)   Microcytic anemia   Respiratory failure (HCC)   #Acute on chronic hypoxic respiratory failure Improving Likely due to pneumonia in a patient with COPD stage D Urine streptococcal pneumonia and Legionella all negative Flu PCR, respiratory virus panel all negative Blood culture x2, no growth so far Repeat chest x-ray shows no infiltrates, just emphysematous changes Continue Atrovent and Xopenex nebs Continue home Pulmicort nebulizer, Spiriva inhaler and Singulair Continue Solu-Medrol IV, currently tapering slowly,  Mucinex for cough Continue IV Rocephin and azithromycin for a total of 7 days Continue oxygen, BiPAP as needed Continous pulse ox  #Severe sepsis Likely due to ?CAP Afebrile, leukocytosis, blood pressure stable Lactic acid elevated, will continue to trend Continue management as above  #New onset Afib Currently rate controlled on diltiazem ggt EKG/telemetry shows  A. Fib Cardiology on board, rec continuing  IV heparin for now and switching to eliquis upon discharge  #Elevated troponin Flat elevation 0.14->0.12->0.14 Likely demand ischemic Cardiology on board, no further intervention for now  #Microcytic anemia Unclear etiology, no signs of active bleed, FOBT negative History of daily high dose ibuprofen use for chronic pain Hemoglobin dropped from 13.5 in April to 8.3 on admission Iron panel shows severe iron deficiency anemia Hemoglobin further dropped to 7.1 on 11/14, transfusion stopped due to possible transfusion reaction Held home Plavix, ibuprofen, aspirin, Lovenox for now GI consult placed, rec obtaining a CT abdomen/pelvis to r/o retroperitoneal bleed. Consider endoscopy once pt more stable/outpatient CT abdomen/pelvis pending Daily CBC  #Possible blood transfusion reaction History as above Status post IV Benadryl, IV Lasix, IV Solu-Medrol Monitor closely  #Hypertension BP stable IV hydralazine as needed  #Hyperlipidemia Continue Crestor  #History of CAD Held Plavix, aspirin due to acute drop in hemoglobin On IV heaprin for now Continue Crestor    Code Status: Full  Family Communication: Wife at bedside  Disposition Plan: Home once stable   Consultants:  Cardiology  GI  Procedures:  Blood transfusion on 05/02/17, stopped due to possible reaction  Antimicrobials:  IV ceftriaxone  IV azithromycin  DVT prophylaxis: IV heparin   Objective: Vitals:   05/03/17 0800 05/03/17 0900 05/03/17 1029 05/03/17 1125  BP: 136/64 (!) 152/105  (!) 141/54  Pulse:   86 86  Resp: (!) 22 (!) 23 20 (!) 22  Temp:    97.6 F (36.4 C)  TempSrc:    Oral  SpO2:   100% 100%  Weight:      Height:        Intake/Output Summary (Last 24 hours) at 05/03/2017 1326 Last data filed at 05/03/2017 1100 Gross per 24 hour  Intake 1575  ml  Output 4720 ml  Net -3145 ml   Filed Weights   05/01/17 0144  Weight: 78 kg (172 lb)    Exam:   General: Alert, mild  distress, awake, oriented X3  Cardiovascular: S1-S2 present, no added heart sounds  Respiratory: Bilateral mild wheezing, decreased air entry bilaterally  Abdomen: Soft, nontender, nondistended, bowel sounds present  Musculoskeletal: No pedal edema bilaterally  Skin: Normal  Psychiatry: Normal mood   Data Reviewed: CBC: Recent Labs  Lab 05/01/17 0145 05/02/17 0649 05/02/17 1427 05/03/17 0410  WBC 7.7 13.1*  --  13.9*  NEUTROABS 5.7 11.9*  --  12.9*  HGB 8.2* 7.1* 9.1* 8.3*  HCT 29.2* 25.6* 32.2* 28.7*  MCV 73.4* 74.0*  --  73.4*  PLT 232 223  --  231   Basic Metabolic Panel: Recent Labs  Lab 05/01/17 0145 05/02/17 0649 05/03/17 0410  NA 136 140 139  K 3.8 4.9 3.8  CL 99* 106 100*  CO2 29 28 29   GLUCOSE 305* 198* 237*  BUN 21* 20 22*  CREATININE 1.07 0.92 1.05  CALCIUM 8.7* 8.5* 8.7*   GFR: Estimated Creatinine Clearance: 69.8 mL/min (by C-G formula based on SCr of 1.05 mg/dL). Liver Function Tests: No results for input(s): AST, ALT, ALKPHOS, BILITOT, PROT, ALBUMIN in the last 168 hours. No results for input(s): LIPASE, AMYLASE in the last 168 hours. No results for input(s): AMMONIA in the last 168 hours. Coagulation Profile: Recent Labs  Lab 05/01/17 0606  INR 1.02   Cardiac Enzymes: Recent Labs  Lab 05/01/17 1530 05/01/17 1822 05/02/17 1850 05/03/17 0044 05/03/17 0410  TROPONINI <0.03 0.03* 0.14* 0.12* 0.14*   BNP (last 3 results) No results for input(s): PROBNP in the last 8760 hours. HbA1C: No results for input(s): HGBA1C in the last 72 hours. CBG: No results for input(s): GLUCAP in the last 168 hours. Lipid Profile: No results for input(s): CHOL, HDL, LDLCALC, TRIG, CHOLHDL, LDLDIRECT in the last 72 hours. Thyroid Function Tests: No results for input(s): TSH, T4TOTAL, FREET4, T3FREE, THYROIDAB in the last 72 hours. Anemia Panel: Recent Labs    05/01/17 0606  VITAMINB12 744  FOLATE 20.7  FERRITIN 3*  TIBC 490*  IRON 10*    RETICCTPCT 1.9   Urine analysis:    Component Value Date/Time   COLORURINE YELLOW 05/02/2017 1427   APPEARANCEUR CLEAR 05/02/2017 1427   LABSPEC 1.010 05/02/2017 1427   PHURINE 6.5 05/02/2017 1427   GLUCOSEU 100 (A) 05/02/2017 1427   HGBUR MODERATE (A) 05/02/2017 1427   BILIRUBINUR NEGATIVE 05/02/2017 1427   KETONESUR NEGATIVE 05/02/2017 1427   PROTEINUR NEGATIVE 05/02/2017 1427   UROBILINOGEN 0.2 08/08/2012 1910   NITRITE NEGATIVE 05/02/2017 1427   LEUKOCYTESUR NEGATIVE 05/02/2017 1427   Sepsis Labs: @LABRCNTIP (procalcitonin:4,lacticidven:4)  ) Recent Results (from the past 240 hour(s))  Culture, blood (x 2)     Status: None (Preliminary result)   Collection Time: 05/01/17  5:45 AM  Result Value Ref Range Status   Specimen Description BLOOD RIGHT ARM  Final   Special Requests   Final    BOTTLES DRAWN AEROBIC AND ANAEROBIC Blood Culture results may not be optimal due to an excessive volume of blood received in culture bottles PATIENT ON FOLLOWING ZITHROMAX 500MG  ROCEPHIN 1GM   Culture NO GROWTH 1 DAY  Final   Report Status PENDING  Incomplete  Culture, blood (x 2)     Status: None (Preliminary result)   Collection Time: 05/01/17  6:00 AM  Result Value Ref Range  Status   Specimen Description BLOOD RIGHT HAND  Final   Special Requests   Final    IN PEDIATRIC BOTTLE Blood Culture results may not be optimal due to an excessive volume of blood received in culture bottles PATIENT ON FOLLOWING ZITHROMAX 500MG  ROCEPHIN 1GM   Culture NO GROWTH 1 DAY  Final   Report Status PENDING  Incomplete  Respiratory Panel by PCR     Status: None   Collection Time: 05/01/17 12:08 PM  Result Value Ref Range Status   Adenovirus NOT DETECTED NOT DETECTED Final   Coronavirus 229E NOT DETECTED NOT DETECTED Final   Coronavirus HKU1 NOT DETECTED NOT DETECTED Final   Coronavirus NL63 NOT DETECTED NOT DETECTED Final   Coronavirus OC43 NOT DETECTED NOT DETECTED Final   Metapneumovirus NOT DETECTED  NOT DETECTED Final   Rhinovirus / Enterovirus NOT DETECTED NOT DETECTED Final   Influenza A NOT DETECTED NOT DETECTED Final   Influenza B NOT DETECTED NOT DETECTED Final   Parainfluenza Virus 1 NOT DETECTED NOT DETECTED Final   Parainfluenza Virus 2 NOT DETECTED NOT DETECTED Final   Parainfluenza Virus 3 NOT DETECTED NOT DETECTED Final   Parainfluenza Virus 4 NOT DETECTED NOT DETECTED Final   Respiratory Syncytial Virus NOT DETECTED NOT DETECTED Final   Bordetella pertussis NOT DETECTED NOT DETECTED Final   Chlamydophila pneumoniae NOT DETECTED NOT DETECTED Final   Mycoplasma pneumoniae NOT DETECTED NOT DETECTED Final      Studies: Dg Chest Port 1 View  Result Date: 05/02/2017 CLINICAL DATA:  Shortness of Breath EXAM: PORTABLE CHEST 1 VIEW COMPARISON:  Chest CT May 01, 2017 and chest radiograph May 01, 2017 FINDINGS: There is underlying emphysematous change with bullous disease in the upper lobes. There is a small calcified granuloma in the left upper lobe. There is no edema or consolidation. Heart is mildly enlarged. The pulmonary vascularity reflects the underlying emphysematous change with decreased vascularity to the upper lobes. No adenopathy. No bone lesions. IMPRESSION: Underlying emphysematous change. No edema or consolidation. Stable cardiac silhouette with heart mildly enlarged given degree of underlying emphysema. Small calcified granuloma left upper lobe. Emphysema (ICD10-J43.9). Electronically Signed   By: Bretta BangWilliam  Woodruff III M.D.   On: 05/02/2017 14:40    Scheduled Meds: . budesonide  0.5 mg Nebulization BID  . feeding supplement (ENSURE ENLIVE)  237 mL Oral TID BM  . iopamidol      . ipratropium  0.5 mg Nebulization TID  . levalbuterol  1.25 mg Nebulization TID  . methylPREDNISolone (SOLU-MEDROL) injection  60 mg Intravenous Q12H  . montelukast  4 mg Oral QHS  . nicotine  21 mg Transdermal Daily  . pantoprazole  40 mg Oral Daily  . pramipexole  0.125 mg Oral  QHS  . rosuvastatin  20 mg Oral QHS    Continuous Infusions: . azithromycin Stopped (05/03/17 0238)  . cefTRIAXone (ROCEPHIN)  IV Stopped (05/03/17 0238)  . diltiazem (CARDIZEM) infusion 15 mg/hr (05/03/17 1000)  . heparin 1,000 Units/hr (05/03/17 16100635)     LOS: 2 days     Briant CedarNkeiruka J Guthrie Lemme, MD [facility]  If 7PM-7AM, please contact night-coverage [email or web address] Password [email or web address] 05/03/2017, 1:26 PM

## 2017-05-03 NOTE — Care Management Note (Signed)
Case Management Note  Patient Details  Name: John Marsh MRN: [id number] Date of Birth: 1946-06-03  Subjective/Objective:    Pt admitted with SOB                Action/Plan:  PTA independent from home with wife.  Pt is on 3 liters oxygen at home supplied by [facility] - pt has portable tank for transport home. Pt has PCP and denied barriers to obtaining/paying for medicaitons   Expected Discharge Date:                  Expected Discharge Plan:     In-House Referral:     Discharge planning Services  CM Consult  Post Acute Care Choice:    Choice offered to:     DME Arranged:    DME Agency:     HH Arranged:    HH Agency:     Status of Service:     If discussed at [employer] Meetings, dates discussed:    Additional Comments:  Cherylann ParrClaxton, Cornelia Walraven S, RN 05/03/2017, 3:27 PM

## 2017-05-03 NOTE — Progress Notes (Signed)
Transported to radiology for ct scan of the abdomen

## 2017-05-03 NOTE — Progress Notes (Signed)
Progress Note  Patient Name: John Marsh Date of Encounter: 05/03/2017  Primary Cardiologist: Verdis PrimeHenry Marsh  Subjective   Still with COPD exacerbation no palpitations or chest pain   Inpatient Medications    Scheduled Meds: . budesonide  0.5 mg Nebulization BID  . feeding supplement (ENSURE ENLIVE)  237 mL Oral TID BM  . ipratropium  0.5 mg Nebulization TID  . levalbuterol  1.25 mg Nebulization TID  . methylPREDNISolone (SOLU-MEDROL) injection  60 mg Intravenous TID  . montelukast  4 mg Oral QHS  . nicotine  21 mg Transdermal Daily  . pantoprazole  40 mg Oral Daily  . pramipexole  0.125 mg Oral QHS  . rosuvastatin  20 mg Oral QHS  . tiotropium  18 mcg Inhalation Daily   Continuous Infusions: . azithromycin Stopped (05/03/17 0238)  . cefTRIAXone (ROCEPHIN)  IV Stopped (05/03/17 0238)  . diltiazem (CARDIZEM) infusion 15 mg/hr (05/03/17 0918)  . heparin 1,000 Units/hr (05/03/17 21300635)   PRN Meds: acetaminophen, dextromethorphan-guaiFENesin, fluticasone, hydrALAZINE, levalbuterol, nitroGLYCERIN, ondansetron (ZOFRAN) IV, zolpidem   Vital Signs    Vitals:   05/03/17 0315 05/03/17 0741 05/03/17 0757 05/03/17 0800  BP:  131/63 131/63 136/64  Pulse:  83 83   Resp:  20 20 (!) 22  Temp:  97.6 F (36.4 C)    TempSrc:  Oral    SpO2: 100% 100% 100%   Weight:      Height:        Intake/Output Summary (Last 24 hours) at 05/03/2017 0931 Last data filed at 05/03/2017 0919 Gross per 24 hour  Intake 270 ml  Output 5120 ml  Net -4850 ml   Filed Weights   05/01/17 0144  Weight: 172 lb (78 kg)    Telemetry    afib rates 100  - Personally Reviewed  ECG    afib nonspecific ST changes  - Personally Reviewed  Physical Exam  Chronically ill COPD er  GEN: No acute distress.   Neck: No JVD Cardiac: RRR, no murmurs, rubs, or gallops.  Respiratory: rhonchi and exp wheezing on auscultation bilaterally. GI: Soft, nontender, non-distended  MS: No edema; No  deformity. Neuro:  Nonfocal  Psych: Normal affect   Labs    Chemistry Recent Labs  Lab 05/01/17 0145 05/02/17 0649 05/03/17 0410  NA 136 140 139  K 3.8 4.9 3.8  CL 99* 106 100*  CO2 29 28 29   GLUCOSE 305* 198* 237*  BUN 21* 20 22*  CREATININE 1.07 0.92 1.05  CALCIUM 8.7* 8.5* 8.7*  GFRNONAA >60 >60 >60  GFRAA >60 >60 >60  ANIONGAP 8 6 10      Hematology Recent Labs  Lab 05/01/17 0145 05/01/17 0606 05/02/17 0649 05/02/17 1427 05/03/17 0410  WBC 7.7  --  13.1*  --  13.9*  RBC 3.98* 3.76* 3.46*  --  3.91*  HGB 8.2*  --  7.1* 9.1* 8.3*  HCT 29.2*  --  25.6* 32.2* 28.7*  MCV 73.4*  --  74.0*  --  73.4*  MCH 20.6*  --  20.5*  --  21.2*  MCHC 28.1*  --  27.7*  --  28.9*  RDW 16.6*  --  17.2*  --  17.0*  PLT 232  --  223  --  231    Cardiac Enzymes Recent Labs  Lab 05/01/17 1822 05/02/17 1850 05/03/17 0044 05/03/17 0410  TROPONINI 0.03* 0.14* 0.12* 0.14*    Recent Labs  Lab 05/01/17 0214  TROPIPOC 0.02  BNP Recent Labs  Lab 05/02/17 1850  BNP 481.7*     DDimer  Recent Labs  Lab 05/01/17 1146  DDIMER 0.33     Radiology    Ct Angio Chest Pe W Or Wo Contrast  Result Date: 05/01/2017 CLINICAL DATA:  Evaluate for acute pulmonary embolus. Shortness of breath. EXAM: CT ANGIOGRAPHY CHEST WITH CONTRAST TECHNIQUE: Multidetector CT imaging of the chest was performed using the standard protocol during bolus administration of intravenous contrast. Multiplanar CT image reconstructions and MIPs were obtained to evaluate the vascular anatomy. CONTRAST:  100mL ISOVUE-370 IOPAMIDOL (ISOVUE-370) INJECTION 76% COMPARISON:  02/01/2017 FINDINGS: Cardiovascular: Satisfactory opacification of the pulmonary arteries to the segmental level. No evidence of pulmonary embolism. Normal heart size. No pericardial effusion. Aortic atherosclerosis. LAD coronary artery calcifications noted. Mediastinum/Nodes: The trachea is patent and midline. Normal appearance of the esophagus.  No mediastinal or hilar adenopathy. Lungs/Pleura: No pleural effusion. Calcified granuloma identified in the left upper lobe. Moderate to advanced changes of centrilobular and paraseptal emphysema. No airspace consolidation. Scarring and architectural distortion within the right upper lobe is again noted compatible with resolving inflammation/infection. Pulmonary nodule within the superior segment of the left lower lobe measures 4 mm, image 72 of series 6. Decreased from 7 mm previously. Right lower lobe pulmonary nodule is identified measuring 7 mm, image 85 of series 6. New from comparison exam. Upper Abdomen: No acute abnormality. Musculoskeletal: There is degenerative disc disease noted within the thoracic spine. No aggressive lytic or sclerotic bone lesions. Review of the MIP images confirms the above findings. IMPRESSION: 1. No evidence for acute pulmonary embolus. 2. Aortic Atherosclerosis (ICD10-I70.0) and Emphysema (ICD10-J43.9). Lad coronary artery calcifications noted. 3. Right lower lobe pulmonary nodule is new from comparison exam measuring 7 mm. Non-contrast chest CT at 6-12 months is recommended. If the nodule is stable at time of repeat CT, then future CT at 18-24 months (from today's scan) is considered optional for low-risk patients, but is recommended for high-risk patients. This recommendation follows the consensus statement: Guidelines for Management of Incidental Pulmonary Nodules Detected on CT Images: From the Fleischner Society 2017; Radiology 2017; 284:228-243. Electronically Signed   By: Signa Kellaylor  Stroud M.D.   On: 05/01/2017 17:40   Dg Chest Port 1 View  Result Date: 05/02/2017 CLINICAL DATA:  Shortness of Breath EXAM: PORTABLE CHEST 1 VIEW COMPARISON:  Chest CT May 01, 2017 and chest radiograph May 01, 2017 FINDINGS: There is underlying emphysematous change with bullous disease in the upper lobes. There is a small calcified granuloma in the left upper lobe. There is no edema  or consolidation. Heart is mildly enlarged. The pulmonary vascularity reflects the underlying emphysematous change with decreased vascularity to the upper lobes. No adenopathy. No bone lesions. IMPRESSION: Underlying emphysematous change. No edema or consolidation. Stable cardiac silhouette with heart mildly enlarged given degree of underlying emphysema. Small calcified granuloma left upper lobe. Emphysema (ICD10-J43.9). Electronically Signed   By: Bretta BangWilliam  Woodruff III M.D.   On: 05/02/2017 14:40    Cardiac Studies   Echo 09/21/16  EF normal 60-65%   Patient Profile     71 y.o. male with gold 4 COPD on home oxygen. Admitted with exacerbation requiring steroids, nebs and antibiotics. Developed new onset Afib.CHA2VASC 3 currently on iv cardizem and heparin  Assessment & Plan    1) PAF:  Rate control ok on cardizem continue heparin will change over to eliquis 5 bid and oral cardizem 60 mg q 5 likely tomorrow Ok to use xopenex  for breathing or even other Beta agonist as the relief in bronchospasm will offset any propensity to increase HR  For questions or updates, please contact [facility] Please consult [email or web address] for contact info under Cardiology/STEMI.      Signed, Charlton Haws, MD  05/03/2017, 9:31 AM

## 2017-05-03 NOTE — Progress Notes (Signed)
ANTICOAGULATION CONSULT NOTE - F/u Consult  Pharmacy Consult for Heparin Indication: atrial fibrillation  No Known Allergies  Patient Measurements: Height: 6' (182.9 cm) Weight: 172 lb (78 kg) IBW/kg (Calculated) : 77.6  Vital Signs: Temp: 97.8 F (36.6 C) (11/15 0302) Temp Source: Oral (11/15 0302) BP: 138/73 (11/15 0302) Pulse Rate: 99 (11/15 0302)  Labs: Recent Labs    05/01/17 0145 05/01/17 0606  05/02/17 0649 05/02/17 1427 05/02/17 1850 05/03/17 0044 05/03/17 0410  HGB 8.2*  --   --  7.1* 9.1*  --   --  8.3*  HCT 29.2*  --   --  25.6* 32.2*  --   --  28.7*  PLT 232  --   --  223  --   --   --  231  LABPROT  --  13.3  --   --   --   --   --   --   INR  --  1.02  --   --   --   --   --   --   HEPARINUNFRC  --   --   --   --   --   --   --  0.66  CREATININE 1.07  --   --  0.92  --   --   --  1.05  TROPONINI  --   --    < >  --   --  0.14* 0.12* 0.14*   < > = values in this interval not displayed.    Estimated Creatinine Clearance: 69.8 mL/min (by C-G formula based on SCr of 1.05 mg/dL).   Medical History: Past Medical History:  Diagnosis Date  . COPD (chronic obstructive pulmonary disease) (HCC)   . Coronary heart disease   . Emphysema of lung (HCC)   . Hyperlipidemia   . Hypertension   . MI (myocardial infarction) (HCC)   . Multiple lung nodules on CT   . Nocturnal hypoxia   . Peripheral arterial disease (HCC)   . Shortness of breath dyspnea    Assessment: 71yom with new onset afib to begin heparin. He is also being followed by GI for anemia/possible GIB. PRBCs ordered on 11/14, but transfusion stopped early d/t possible reaction.   Will dose heparin conservatively and aim for lower goal in setting of above.   Heparin level supratherapeutic for low heparin goal. Hgb down slightly from 9.1 > 8.3 and plts stable. No overt s/s bleeding noted per RN.   Goal of Therapy:  Heparin level 0.3-0.5 units/ml Monitor platelets by anticoagulation protocol: Yes   Plan:  Decrease heparin gtt to 1000 units/hr Heparin level in 8 hrs Daily heparin level and CBC Monitor for s/s bleeding   Einar CrowKatherine Weigle, PharmD Clinical Pharmacist 05/03/17 6:18 AM

## 2017-05-03 NOTE — Progress Notes (Signed)
[facility]  John HeckHugh T Marsh 71 y.o. 12/15/1945  CC:  Significant drop in hemoglobin   Subjective: Patient developed A. fib yesterday. Currently transfer to stepdown. On heparin drip. Denied any GI symptoms. Denied abdominal pain, blood in the stool or black stool.  ROS : Negative for chest pain. Positive for shortness of breath and weakness   Objective: Vital signs in last 24 hours: Vitals:   05/03/17 0741 05/03/17 0757  BP: 131/63 131/63  Pulse: 83 83  Resp: 20 20  Temp: 97.6 F (36.4 C)   SpO2: 100% 100%    Physical Exam:  General:  Alert, cooperative, no distress, appears stated age, oxygen by nasal cannula   Head:  Normocephalic, without obvious abnormality, atraumatic  Eyes:  , EOM's intact,   Lungs:   Decreased breath sounds bilaterally   Heart:  Regular rate and rhythm, S1, S2 normal  Abdomen:   Soft, non-tender, bowel sounds present  no masses, no peritoneal signs   Extremities: Extremities normal, atraumatic, no  edema  Pulses: 2+ and symmetric    Lab Results: Recent Labs    05/02/17 0649 05/03/17 0410  NA 140 139  K 4.9 3.8  CL 106 100*  CO2 28 29  GLUCOSE 198* 237*  BUN 20 22*  CREATININE 0.92 1.05  CALCIUM 8.5* 8.7*   No results for input(s): AST, ALT, ALKPHOS, BILITOT, PROT, ALBUMIN in the last 72 hours. Recent Labs    05/02/17 0649 05/02/17 1427 05/03/17 0410  WBC 13.1*  --  13.9*  NEUTROABS 11.9*  --  12.9*  HGB 7.1* 9.1* 8.3*  HCT 25.6* 32.2* 28.7*  MCV 74.0*  --  73.4*  PLT 223  --  231   Recent Labs    05/01/17 0606  LABPROT 13.3  INR 1.02      Assessment/Plan: - Significant drop in hemoglobin without any overt bleeding. Occult blood negative. - Respiratory failure - Atrial fibrillation. Currently on heparin drip. - Blood transfusion reaction - History of coronary artery disease. History of COPD  Recommendations -------------------------- -  CT abdomen pelvis to rule out retroperitoneal bleed. -  No evidence of overt bleeding despite of being on heparin drip - Not stable enough for any endoscopic intervention at this time. - GI will follow    Kathi DerParag Desiray Orchard MD, FACP 05/03/2017, 8:25 AM  Contact #  [phone]

## 2017-05-03 NOTE — Progress Notes (Signed)
Pt. Was about to go down for ct of the abd. Suddenly became sob and anxious. Stat mn tx given by RT with relief after few min. MD aware.. Called ct scan that pt. Ready for procedure claimed to send transporter. Awaiting transporter.

## 2017-05-03 NOTE — Progress Notes (Signed)
Progress Note  Patient Name: John Marsh Date of Encounter: 05/03/2017  Primary Cardiologist: Garnette ScheuermannHank Smith   Subjective   Patient endorses feeling better today. Last had chest pain and shortness of breath yesterday. He endorses LE swelling and chronic LE pain, worse with rest.  Inpatient Medications    Scheduled Meds: . budesonide  0.5 mg Nebulization BID  . feeding supplement (ENSURE ENLIVE)  237 mL Oral TID BM  . ipratropium  0.5 mg Nebulization TID  . levalbuterol  1.25 mg Nebulization TID  . methylPREDNISolone (SOLU-MEDROL) injection  60 mg Intravenous TID  . montelukast  4 mg Oral QHS  . nicotine  21 mg Transdermal Daily  . pantoprazole  40 mg Oral Daily  . pramipexole  0.125 mg Oral QHS  . rosuvastatin  20 mg Oral QHS  . tiotropium  18 mcg Inhalation Daily   Continuous Infusions: . azithromycin Stopped (05/03/17 0238)  . cefTRIAXone (ROCEPHIN)  IV Stopped (05/03/17 0238)  . diltiazem (CARDIZEM) infusion 15 mg/hr (05/03/17 0348)  . heparin 1,000 Units/hr (05/03/17 16100635)   PRN Meds: acetaminophen, dextromethorphan-guaiFENesin, fluticasone, hydrALAZINE, levalbuterol, nitroGLYCERIN, ondansetron (ZOFRAN) IV, zolpidem   Vital Signs    Vitals:   05/03/17 0315 05/03/17 0741 05/03/17 0757 05/03/17 0800  BP:  131/63 131/63 136/64  Pulse:  83 83   Resp:  20 20 (!) 22  Temp:  97.6 F (36.4 C)    TempSrc:  Oral    SpO2: 100% 100% 100%   Weight:      Height:        Intake/Output Summary (Last 24 hours) at 05/03/2017 0857 Last data filed at 05/03/2017 0742 Gross per 24 hour  Intake 270 ml  Output 4620 ml  Net -4350 ml   Filed Weights   05/01/17 0144  Weight: 172 lb (78 kg)    Telemetry    A fib, HR ~100s - Personally Reviewed, 11/15  ECG    N/a today  Physical Exam   GEN: No acute distress, sitting on side of bed.   Neck: No JVD Cardiac: irregularly irregular rhythm, no murmurs, rubs, or gallops. Radial pulses 2+ bil, decreased bil DP/PT  pulses Respiratory: Decreased breath sounds bilaterally, no rales or wheezes appreciated. GI: Soft, nontender, non-distended  MS: No deformity. 1+ pitting edema in bil LEs Neuro:  Nonfocal  Psych: Normal affect   Labs    Chemistry Recent Labs  Lab 05/01/17 0145 05/02/17 0649 05/03/17 0410  NA 136 140 139  K 3.8 4.9 3.8  CL 99* 106 100*  CO2 29 28 29   GLUCOSE 305* 198* 237*  BUN 21* 20 22*  CREATININE 1.07 0.92 1.05  CALCIUM 8.7* 8.5* 8.7*  GFRNONAA >60 >60 >60  GFRAA >60 >60 >60  ANIONGAP 8 6 10      Hematology Recent Labs  Lab 05/01/17 0145 05/01/17 0606 05/02/17 0649 05/02/17 1427 05/03/17 0410  WBC 7.7  --  13.1*  --  13.9*  RBC 3.98* 3.76* 3.46*  --  3.91*  HGB 8.2*  --  7.1* 9.1* 8.3*  HCT 29.2*  --  25.6* 32.2* 28.7*  MCV 73.4*  --  74.0*  --  73.4*  MCH 20.6*  --  20.5*  --  21.2*  MCHC 28.1*  --  27.7*  --  28.9*  RDW 16.6*  --  17.2*  --  17.0*  PLT 232  --  223  --  231    Cardiac Enzymes Recent Labs  Lab 05/01/17 1822 05/02/17  1850 05/03/17 0044 05/03/17 0410  TROPONINI 0.03* 0.14* 0.12* 0.14*    Recent Labs  Lab 05/01/17 0214  TROPIPOC 0.02     BNP Recent Labs  Lab 05/02/17 1850  BNP 481.7*     DDimer  Recent Labs  Lab 05/01/17 1146  DDIMER 0.33     Radiology    Ct Angio Chest Pe W Or Wo Contrast  Result Date: 05/01/2017 CLINICAL DATA:  Evaluate for acute pulmonary embolus. Shortness of breath. EXAM: CT ANGIOGRAPHY CHEST WITH CONTRAST TECHNIQUE: Multidetector CT imaging of the chest was performed using the standard protocol during bolus administration of intravenous contrast. Multiplanar CT image reconstructions and MIPs were obtained to evaluate the vascular anatomy. CONTRAST:  ISOVUE-370 IOPAMIDOL (ISOVUE-370) INJECTION 76% COMPARISON:  02/01/2017 FINDINGS: Cardiovascular: Satisfactory opacification of the pulmonary arteries to the segmental level. No evidence of pulmonary embolism. Normal heart size. No pericardial  effusion. Aortic atherosclerosis. LAD coronary artery calcifications noted. Mediastinum/Nodes: The trachea is patent and midline. Normal appearance of the esophagus. No mediastinal or hilar adenopathy. Lungs/Pleura: No pleural effusion. Calcified granuloma identified in the left upper lobe. Moderate to advanced changes of centrilobular and paraseptal emphysema. No airspace consolidation. Scarring and architectural distortion within the right upper lobe is again noted compatible with resolving inflammation/infection. Pulmonary nodule within the superior segment of the left lower lobe measures 4 mm, image 72 of series 6. Decreased from 7 mm previously. Right lower lobe pulmonary nodule is identified measuring 7 mm, image 85 of series 6. New from comparison exam. Upper Abdomen: No acute abnormality. Musculoskeletal: There is degenerative disc disease noted within the thoracic spine. No aggressive lytic or sclerotic bone lesions. Review of the MIP images confirms the above findings. IMPRESSION: 1. No evidence for acute pulmonary embolus. 2. Aortic Atherosclerosis (ICD10-I70.0) and Emphysema (ICD10-J43.9). Lad coronary artery calcifications noted. 3. Right lower lobe pulmonary nodule is new from comparison exam measuring 7 mm. Non-contrast chest CT at 6-12 months is recommended. If the nodule is stable at time of repeat CT, then future CT at 18-24 months (from today's scan) is considered optional for low-risk patients, but is recommended for high-risk patients. This recommendation follows the consensus statement: Guidelines for Management of Incidental Pulmonary Nodules Detected on CT Images: From the Fleischner Society 2017; Radiology 2017; 284:228-243. Electronically Signed   By: Signa Kell M.D.   On: 05/01/2017 17:40   Dg Chest Port 1 View  Result Date: 05/02/2017 CLINICAL DATA:  Shortness of Breath EXAM: PORTABLE CHEST 1 VIEW COMPARISON:  Chest CT May 01, 2017 and chest radiograph May 01, 2017  FINDINGS: There is underlying emphysematous change with bullous disease in the upper lobes. There is a small calcified granuloma in the left upper lobe. There is no edema or consolidation. Heart is mildly enlarged. The pulmonary vascularity reflects the underlying emphysematous change with decreased vascularity to the upper lobes. No adenopathy. No bone lesions. IMPRESSION: Underlying emphysematous change. No edema or consolidation. Stable cardiac silhouette with heart mildly enlarged given degree of underlying emphysema. Small calcified granuloma left upper lobe. Emphysema (ICD10-J43.9). Electronically Signed   By: Bretta Bang III M.D.   On: 05/02/2017 14:40    Cardiac Studies   Echocardiogram 09/21/2016 Study Conclusions  - Left ventricle: The cavity size was normal. Systolic function was normal. The estimated ejection fraction was in the range of 60% to 65%. Wall motion was normal; there were no regional wall motion abnormalities  Patient Profile     NUMAIR MASDEN is a 71  y.o. male with a hx of COPD, chronic respiratory failure on 3L home oxygen, HTN, HLD, GERD, PVD, CAD, tobacco use who is being seen today for the evaluation of atrial fibrillation with rapid ventricular response in setting of sepsis from pneumonia and anemia.  Assessment & Plan    A fib with RVR: CHA2DS2/VAS score of 3. In setting of sepsis and acute anemia. Currently rate controlled on diltiazem gtt (3615ml/hr current rate). On heparin gtt for anticoagulation. BNP elevated to 400s and has LE edema. --continue diltiazem drip and heparin drip for now; may be able to transition to oral diltiazem in the next day or so. --would consider repeating lasix today --recommend DOAC on discharge  CAD: Patient had chest pain in setting of sepsis, pneumonia, a fib with RVR and acute anemia; troponins trended flat at 0.14>0.12>0.14 likely from demand ischemia. Plavix and ASA have been held as patient acutely anemic and on  heparin gtt. Has not been on BB due to severe COPD. --cont crestor  Acute anemia: S/p 1 u pRBCs on admission. Hgb this AM is 8.3. GI on board and will pursue CT abd/pelvis to evaluate for retroperitoneal bleed.  Acute on chronic resp failure 2/2 pneumonia, COPD exacerbation, and complicated by possible transfusion reaction: He required BiPAP yesterday during transfusion but now is stable on 3L [place] (home regimen). On solumedrol, azithromycin, rocephin.  For questions or updates, please contact [facility] Please consult [email or web address] for contact info under Cardiology/STEMI.   Signed, Nyra MarketGorica Svalina, MD  05/03/2017, 8:57 AM    See my separate progress note same day  Charlton HawsPeter Giannis Corpuz

## 2017-05-03 NOTE — Progress Notes (Signed)
ANTICOAGULATION CONSULT NOTE - Follow Up Consult  Pharmacy Consult for Heparin Indication: atrial fibrillation  No Known Allergies  Patient Measurements: Height: 6' (182.9 cm) Weight: 172 lb (78 kg) IBW/kg (Calculated) : 77.6  Vital Signs: Temp: 98.8 F (37.1 C) (11/15 1936) Temp Source: Oral (11/15 1936) BP: 137/59 (11/15 1936) Pulse Rate: 87 (11/15 1936)  Labs: Recent Labs    05/01/17 0145 05/01/17 0606  05/02/17 0649 05/02/17 1427 05/02/17 1850 05/03/17 0044 05/03/17 0410 05/03/17 1427 05/03/17 2133  HGB 8.2*  --   --  7.1* 9.1*  --   --  8.3*  --   --   HCT 29.2*  --   --  25.6* 32.2*  --   --  28.7*  --   --   PLT 232  --   --  223  --   --   --  231  --   --   LABPROT  --  13.3  --   --   --   --   --   --   --   --   INR  --  1.02  --   --   --   --   --   --   --   --   HEPARINUNFRC  --   --   --   --   --   --   --  0.66 0.67 0.47  CREATININE 1.07  --   --  0.92  --   --   --  1.05  --   --   TROPONINI  --   --    < >  --   --  0.14* 0.12* 0.14*  --   --    < > = values in this interval not displayed.    Estimated Creatinine Clearance: 69.8 mL/min (by C-G formula based on SCr of 1.05 mg/dL).  Medications: Heparin @ 850 units/hr  Assessment: John Marsh continues on heparin for new afib. He is also being followed by GI for anemia/possible GIB. PRBCs ordered on 11/14, but transfusion stopped early d/t possible reaction.   Will dose heparin conservatively and aim for lower goal in setting of above.   Heparin level is now therapeutic at 0.47.  Goal of Therapy:  Heparin level 0.3-0.5 units/ml Monitor platelets by anticoagulation protocol: Yes   Plan:  1) Continue heparin at 850 units/hr 2) Daily heparin level and CBC  Louie CasaJennifer Luqman Perrelli, PharmD, BCPS 05/03/17 10:09 PM

## 2017-05-04 LAB — CBC WITH DIFFERENTIAL/PLATELET
BASOS ABS: 0 10*3/uL (ref 0.0–0.1)
Basophils Relative: 0 %
EOS PCT: 0 %
Eosinophils Absolute: 0 10*3/uL (ref 0.0–0.7)
HCT: 28.7 % — ABNORMAL LOW (ref 39.0–52.0)
Hemoglobin: 8.2 g/dL — ABNORMAL LOW (ref 13.0–17.0)
LYMPHS ABS: 0.6 10*3/uL — AB (ref 0.7–4.0)
LYMPHS PCT: 5 %
MCH: 20.8 pg — ABNORMAL LOW (ref 26.0–34.0)
MCHC: 28.6 g/dL — ABNORMAL LOW (ref 30.0–36.0)
MCV: 72.8 fL — AB (ref 78.0–100.0)
MONO ABS: 0.5 10*3/uL (ref 0.1–1.0)
MONOS PCT: 4 %
Neutro Abs: 11.2 10*3/uL — ABNORMAL HIGH (ref 1.7–7.7)
Neutrophils Relative %: 91 %
PLATELETS: 230 10*3/uL (ref 150–400)
RBC: 3.94 MIL/uL — AB (ref 4.22–5.81)
RDW: 16.8 % — AB (ref 11.5–15.5)
WBC: 12.3 10*3/uL — ABNORMAL HIGH (ref 4.0–10.5)

## 2017-05-04 LAB — BASIC METABOLIC PANEL
ANION GAP: 11 (ref 5–15)
BUN: 28 mg/dL — ABNORMAL HIGH (ref 6–20)
CALCIUM: 8.5 mg/dL — AB (ref 8.9–10.3)
CO2: 27 mmol/L (ref 22–32)
Chloride: 98 mmol/L — ABNORMAL LOW (ref 101–111)
Creatinine, Ser: 1.11 mg/dL (ref 0.61–1.24)
Glucose, Bld: 259 mg/dL — ABNORMAL HIGH (ref 65–99)
POTASSIUM: 3.7 mmol/L (ref 3.5–5.1)
Sodium: 136 mmol/L (ref 135–145)

## 2017-05-04 LAB — HEPARIN LEVEL (UNFRACTIONATED): HEPARIN UNFRACTIONATED: 0.29 [IU]/mL — AB (ref 0.30–0.70)

## 2017-05-04 MED ORDER — DOXYCYCLINE HYCLATE 100 MG PO TABS
100.0000 mg | ORAL_TABLET | Freq: Two times a day (BID) | ORAL | Status: DC
  Administered 2017-05-04 – 2017-05-06 (×5): 100 mg via ORAL
  Filled 2017-05-04 (×5): qty 1

## 2017-05-04 MED ORDER — DILTIAZEM HCL 60 MG PO TABS
60.0000 mg | ORAL_TABLET | Freq: Four times a day (QID) | ORAL | Status: DC
  Administered 2017-05-04: 60 mg via ORAL
  Filled 2017-05-04: qty 1

## 2017-05-04 MED ORDER — BENZONATATE 100 MG PO CAPS
100.0000 mg | ORAL_CAPSULE | Freq: Three times a day (TID) | ORAL | Status: DC
  Administered 2017-05-04 – 2017-05-06 (×7): 100 mg via ORAL
  Filled 2017-05-04 (×7): qty 1

## 2017-05-04 MED ORDER — HYDROCOD POLST-CPM POLST ER 10-8 MG/5ML PO SUER: 5.0000 mL | Freq: Two times a day (BID) | ORAL | Status: DC | PRN

## 2017-05-04 MED ORDER — GABAPENTIN 100 MG PO CAPS
100.0000 mg | ORAL_CAPSULE | Freq: Two times a day (BID) | ORAL | Status: DC
  Administered 2017-05-04 – 2017-05-06 (×5): 100 mg via ORAL
  Filled 2017-05-04 (×5): qty 1

## 2017-05-04 MED ORDER — MENTHOL 3 MG MT LOZG: 1.0000 | LOZENGE | OROMUCOSAL | Status: DC | PRN

## 2017-05-04 MED ORDER — DILTIAZEM HCL 60 MG PO TABS
90.0000 mg | ORAL_TABLET | Freq: Four times a day (QID) | ORAL | Status: AC
  Administered 2017-05-04 – 2017-05-06 (×7): 90 mg via ORAL
  Filled 2017-05-04 (×7): qty 1

## 2017-05-04 MED ORDER — APIXABAN 5 MG PO TABS
5.0000 mg | ORAL_TABLET | Freq: Two times a day (BID) | ORAL | Status: DC
  Administered 2017-05-04 – 2017-05-06 (×5): 5 mg via ORAL
  Filled 2017-05-04 (×5): qty 1

## 2017-05-04 MED ORDER — GUAIFENESIN ER 600 MG PO TB12
600.0000 mg | ORAL_TABLET | Freq: Two times a day (BID) | ORAL | Status: DC
  Administered 2017-05-04 – 2017-05-06 (×5): 600 mg via ORAL
  Filled 2017-05-04 (×5): qty 1

## 2017-05-04 NOTE — Progress Notes (Signed)
ANTICOAGULATION CONSULT NOTE - Follow Up Consult  Pharmacy Consult for Heparin>apixaban Indication: atrial fibrillation  No Known Allergies  Patient Measurements: Height: 6' (182.9 cm) Weight: 172 lb (78 kg) IBW/kg (Calculated) : 77.6  Vital Signs: Temp: 97.8 F (36.6 C) (11/16 0848) Temp Source: Oral (11/16 0848) BP: 127/59 (11/16 0848) Pulse Rate: 84 (11/16 0755)  Labs: Recent Labs    05/02/17 0649 05/02/17 1427 05/02/17 1850 05/03/17 0044  05/03/17 0410 05/03/17 1427 05/03/17 2133 05/04/17 0232  HGB 7.1* 9.1*  --   --   --  8.3*  --   --  8.2*  HCT 25.6* 32.2*  --   --   --  28.7*  --   --  28.7*  PLT 223  --   --   --   --  231  --   --  230  HEPARINUNFRC  --   --   --   --    < > 0.66 0.67 0.47 0.29*  CREATININE 0.92  --   --   --   --  1.05  --   --  1.11  TROPONINI  --   --  0.14* 0.12*  --  0.14*  --   --   --    < > = values in this interval not displayed.    Estimated Creatinine Clearance: 66 mL/min (by C-G formula based on SCr of 1.11 mg/dL).  Medications: Heparin @ 850 units/hr  Assessment: 71yom continues on heparin for new afib. He is also being followed by GI for anemia/possible GIB. PRBCs ordered on 11/14, but transfusion stopped early d/t possible reaction. Will dose heparin conservatively and aim for lower goal in setting of above. Hg low stable 8.2, plt wnl. SCr stable 1.11. No active bleeding documented.  Pharmacy consulted to transition to apixaban. Communicated plan with RN.  Goal of Therapy:  Stroke prevention Monitor platelets by anticoagulation protocol: Yes   Plan:  D/c heparin at time of 1st dose of apixaban Apixaban 5mg  PO BID Monitor CBC, for s/sx bleeding closely  Babs BertinHaley Tashauna Caisse, PharmD, BCPS Clinical Pharmacist Rx Phone # for today: [phone] After 3:30PM, please call Main Rx: #[id number] 05/04/2017 9:34 AM

## 2017-05-04 NOTE — Progress Notes (Signed)
ANTICOAGULATION CONSULT NOTE - Follow Up Consult  Pharmacy Consult for heparin Indication: atrial fibrillation  Labs: Recent Labs    05/01/17 0606  05/02/17 0649 05/02/17 1427 05/02/17 1850 05/03/17 0044  05/03/17 0410 05/03/17 1427 05/03/17 2133 05/04/17 0232  HGB  --    < > 7.1* 9.1*  --   --   --  8.3*  --   --  8.2*  HCT  --    < > 25.6* 32.2*  --   --   --  28.7*  --   --  28.7*  PLT  --   --  223  --   --   --   --  231  --   --  230  LABPROT 13.3  --   --   --   --   --   --   --   --   --   --   INR 1.02  --   --   --   --   --   --   --   --   --   --   HEPARINUNFRC  --   --   --   --   --   --    < > 0.66 0.67 0.47 0.29*  CREATININE  --   --  0.92  --   --   --   --  1.05  --   --  1.11  TROPONINI  --    < >  --   --  0.14* 0.12*  --  0.14*  --   --   --    < > = values in this interval not displayed.    Assessment: 71yo male now slightly subtherapeutic on heparin after one level at goal; low goal for possible GIB.  Goal of Therapy:  Heparin level 0.3-0.5 units/ml   Plan:  Will increase heparin gtt slightly to 900 units/hr and check level in 6hr.  Vernard GamblesVeronda Yee Gangi, PharmD, BCPS  05/04/2017,4:00 AM

## 2017-05-04 NOTE — Evaluation (Signed)
Physical Therapy Evaluation Patient Details Name: John HeckHugh T Hellenbrand MRN: [id number] DOB: 1946-03-03 Today's Date: 05/04/2017   History of Present Illness  Pt is a 71 y.o. male admitted on 05/01/17 with SOB and cough; was found to have CHF exacerbation with pneumonia. Pertinent PMH includes COPD, chronic respiratory failure (3L O2 at home), HTN, HLD, CAD, PVD, GERD.     Clinical Impression  Pt presents with decreased activity tolerance and an overall decrease in functional mobility secondary to above. PTA, pt indep with limited amb distances secondary to dyspnea (uses 3L home O2) and lives with wife. Educ on activity pacing, deep breathing, and importance of mobility. Today, pt able to amb 100' with RW and supervision; further distance limited secondary to dyspnea 3/4. Pt would benefit from continued acute PT services to maximize functional mobility and independence prior to d/c with Cardiac Rehab services.     Follow Up Recommendations Other (comment)(Cardiac Rehab)    Equipment Recommendations  None recommended by PT    Recommendations for Other Services       Precautions / Restrictions Precautions Precautions: Fall Restrictions Weight Bearing Restrictions: No      Mobility  Bed Mobility Overal bed mobility: Independent                Transfers Overall transfer level: Independent Equipment used: Rolling walker (2 wheeled);None                Ambulation/Gait Ambulation/Gait assistance: Supervision Ambulation Distance (Feet): 100 Feet Assistive device: Rolling walker (2 wheeled) Gait Pattern/deviations: Step-through pattern;Decreased stride length Gait velocity: Decreased Gait velocity interpretation: <1.8 ft/sec, indicative of risk for recurrent falls General Gait Details: Slow, mildly unsteady gait with RW; pt not reliant on RW for balance, but uses for support for standing rest break secondary to dyspnea 3/4. C/o fatigue in BLEs  Stairs             Wheelchair Mobility    Modified Rankin (Stroke Patients Only)       Balance Overall balance assessment: Needs assistance   Sitting balance-Leahy Scale: Good       Standing balance-Leahy Scale: Fair                               Pertinent Vitals/Pain Pain Assessment: Faces Faces Pain Scale: No hurt    Home Living Family/patient expects to be discharged to:: Private residence Living Arrangements: Spouse/significant other Available Help at Discharge: Family;Available 24 hours/day Type of Home: House Home Access: Stairs to enter Entrance Stairs-Rails: None Entrance Stairs-Number of Steps: 2 Home Layout: Multi-level;Able to live on main level with bedroom/bathroom Home Equipment: Walker - 4 wheels;Cane - single point      Prior Function Level of Independence: Independent with assistive device(s)         Comments: Indep with intermittent use of RW or SPC; 3L O2 baseline home oxygen. Very limited amb distances secondary to SOB. Considering purchasing an Musicianelectric scooter     Hand Dominance        Extremity/Trunk Assessment   Upper Extremity Assessment Upper Extremity Assessment: Overall WFL for tasks assessed    Lower Extremity Assessment Lower Extremity Assessment: Generalized weakness    Cervical / Trunk Assessment Cervical / Trunk Assessment: Kyphotic  Communication   Communication: No difficulties  Cognition Arousal/Alertness: Awake/alert Behavior During Therapy: WFL for tasks assessed/performed Overall Cognitive Status: Within Functional Limits for tasks assessed  General Comments General comments (skin integrity, edema, etc.): Wife present during session    Exercises     Assessment/Plan    PT Assessment Patient needs continued PT services  PT Problem List Decreased strength;Decreased activity tolerance;Decreased balance;Decreased mobility;Cardiopulmonary status limiting  activity       PT Treatment Interventions DME instruction;Gait training;Stair training;Functional mobility training;Therapeutic activities;Therapeutic exercise;Balance training;Patient/family education    PT Goals (Current goals can be found in the Care Plan section)  Acute Rehab PT Goals Patient Stated Goal: Be able to walk further PT Goal Formulation: With patient Time For Goal Achievement: 05/18/17 Potential to Achieve Goals: Good    Frequency Min 3X/week   Barriers to discharge        Co-evaluation               AM-PAC PT "6 Clicks" Daily Activity  Outcome Measure Difficulty turning over in bed (including adjusting bedclothes, sheets and blankets)?: None Difficulty moving from lying on back to sitting on the side of the bed? : None Difficulty sitting down on and standing up from a chair with arms (e.g., wheelchair, bedside commode, etc,.)?: None Help needed moving to and from a bed to chair (including a wheelchair)?: A Little Help needed walking in hospital room?: A Little Help needed climbing 3-5 steps with a railing? : A Little 6 Click Score: 21    End of Session Equipment Utilized During Treatment: Gait belt;Oxygen Activity Tolerance: Patient tolerated treatment well Patient left: in bed;with call bell/phone within reach;Other (comment);with family/visitor present(seated EOB) Nurse Communication: Mobility status PT Visit Diagnosis: Other abnormalities of gait and mobility (R26.89)    Time: [id number] PT Time Calculation (min) (ACUTE ONLY): 25 min   Charges:   PT Evaluation $PT Eval Moderate Complexity: 1 Mod PT Treatments $Gait Training: 8-22 mins   PT G Codes:       Ina HomesJaclyn Kaytee Taliercio, PT, DPT Acute Rehab Services  Pager: [phone]  Malachy ChamberJaclyn L Fabienne Nolasco 05/04/2017, 5:36 PM

## 2017-05-04 NOTE — Progress Notes (Signed)
[facility] Progress Note  Patient: John HeckHugh T Dimichele ZOX:[id number]   PCP: Eartha InchBadger, Michael C, MD DOB: 04-23-1946   DOA: 05/01/2017   DOS: 05/04/2017   Date of Service: the patient was seen and examined on 05/04/2017  Subjective: Continues to have cough.  No abdominal pain had some nausea no vomiting.  No fever no chills.  Still has shortness of breath is somewhat better than yesterday.  Talking in short sentences.  Complains about having severe back pain ongoing for last 3 months, on chart review it has been ongoing since at least January 2018.  Brief hospital course: Pt. with PMH of COPD, chronic respiratory failure, 3 L oxygen at home, HTN, HLD, CAD, PVD, GERD; admitted on 05/01/2017, presented with complaint of shortness of breath and cough, was found to have COPD exacerbation with pneumonia. Currently further plan is continue current management.  Assessment and Plan: 1.  Acute hypoxic respiratory failure on chronic. COPD exacerbation. Acute bronchitis CT chest PE protocol is negative for any pulmonary embolism, shows possible right pulmonary nodule. Urine streptococcal pneumonia and Legionella all negative Flu PCR, respiratory virus panel all negative Blood culture x2, no growth so far Repeat chest x-ray shows no infiltrates, just emphysematous changes Continue Atrovent and Xopenex nebs Continue home Pulmicort nebulizer, Spiriva inhaler and Singulair Continue Solu-Medrol IV, currently tapering slowly,  Mucinex for cough Patient was on IV Rocephin and azithromycin , changing to oral doxycycline Continue oxygen, on entering the stepdown unit Continous pulse ox   2.  New onset A. fib. Chads score is 3. Started on IV heparin initially and currently on oral Eliquis. Also started on oral Cardizem.  Due to persistent tachycardia increasing the dose to 90 mg every 6 hours. Monitor on stepdown unit for now. Appreciate cardiology input.  3.  Elevated troponin. Demand ischemia.   Troponins peaked to 0.14.  No intervention required for now.  4.  Iron deficiency anemia. Patient was on high-dose ibuprofen at home, he did have a significant drop in hemoglobin from 13-8. FOBT negative, no active bleeding reported. CT abdomen pelvis without contrast negative for any acute intra-abdominal bleeding. Hemoglobin remained stable despite being on heparin as well as currently on Eliquis. At this point GI was consulted and recommend no further inpatient workup. Outpatient follow-up. Cardiology would recommend resumption of Plavix and aspirin  5.  Chronic back pain. Patient complains about worsening of his chronic back pain. Had initial evaluation with vascular surgery back in January 2018. Symptoms appears to be positive for a neurological pain, probable sciatica. We will start the patient on gabapentin and recommend outpatient follow-up with neurology as recommended by vascular surgery earlier.  May also benefit from physical therapy on discharge.  6.  Essential hypertension. Blood pressure stable. On Cardizem.  Monitor.  Diet: Cardiac diet DVT Prophylaxis: on therapeutic anticoagulation.  Advance goals of care discussion: full code  Family Communication: family was present at bedside, at the time of interview. The pt provided permission to discuss medical plan with the family. Opportunity was given to ask question and all questions were answered satisfactorily.   Disposition:  Discharge to home.  Consultants: cardiology, gastroenterology Procedures: Echocardiogram  Antibiotics: Anti-infectives (From admission, onward)   Start     Dose/Rate Route Frequency Ordered Stop   05/04/17 1130  doxycycline (VIBRA-TABS) tablet 100 mg     100 mg Oral Every 12 hours 05/04/17 1030     05/01/17 2200  azithromycin (ZITHROMAX) 500 mg in dextrose 5 % 250 mL IVPB  Status:  Discontinued     500 mg 250 mL/hr over 60 Minutes Intravenous Every 24 hours 05/01/17 0443 05/04/17 1029    05/01/17 2200  cefTRIAXone (ROCEPHIN) 1 g in dextrose 5 % 50 mL IVPB  Status:  Discontinued     1 g 100 mL/hr over 30 Minutes Intravenous Every 24 hours 05/01/17 0443 05/04/17 1029   05/01/17 0445  cefTRIAXone (ROCEPHIN) 1 g in dextrose 5 % 50 mL IVPB  Status:  Discontinued     1 g 100 mL/hr over 30 Minutes Intravenous Every 24 hours 05/01/17 0442 05/01/17 0443   05/01/17 0445  azithromycin (ZITHROMAX) 500 mg in dextrose 5 % 250 mL IVPB  Status:  Discontinued     500 mg 250 mL/hr over 60 Minutes Intravenous Every 24 hours 05/01/17 0442 05/01/17 0443   05/01/17 0230  cefTRIAXone (ROCEPHIN) 1 g in dextrose 5 % 50 mL IVPB     1 g 100 mL/hr over 30 Minutes Intravenous  Once 05/01/17 0228 05/01/17 0330   05/01/17 0230  azithromycin (ZITHROMAX) 500 mg in dextrose 5 % 250 mL IVPB     500 mg 250 mL/hr over 60 Minutes Intravenous  Once 05/01/17 0228 05/01/17 0757       Objective: Physical Exam: Vitals:   05/04/17 1238 05/04/17 1345 05/04/17 1353 05/04/17 1600  BP: (!) 186/56  125/61   Pulse:  (!) 102    Resp:  (!) 22 (!) 29   Temp:    98.1 F (36.7 C)  TempSrc:    Oral  SpO2:  98%    Weight:      Height:        Intake/Output Summary (Last 24 hours) at 05/04/2017 1607 Last data filed at 05/04/2017 0454 Gross per 24 hour  Intake 737.88 ml  Output 1000 ml  Net -262.12 ml   Filed Weights   05/01/17 0144  Weight: 78 kg (172 lb)   General: Alert, Awake and Oriented to Time, Place and Person. Appear in marked distress, affect appropriate Eyes: PERRL, Conjunctiva normal ENT: Oral Mucosa clear moist Neck: no JVD, no Abnormal Mass Or lumps Cardiovascular: S1 and S2 Present, no Murmur, Peripheral Pulses Present Respiratory: increased respiratory effort, Bilateral Air entry equal and Decreased, positive use of accessory muscle, basal Crackles, bilateral expiratory wheezes Abdomen: Bowel Sound present, Soft and no tenderness, no hernia Skin: n redness, no Rash, no  induration Extremities: bilateral Pedal edema, no calf tenderness Neurologic: Grossly no focal neuro deficit. Bilaterally Equal motor strength  Data Reviewed: CBC: Recent Labs  Lab 05/01/17 0145 05/02/17 0649 05/02/17 1427 05/03/17 0410 05/04/17 0232  WBC 7.7 13.1*  --  13.9* 12.3*  NEUTROABS 5.7 11.9*  --  12.9* 11.2*  HGB 8.2* 7.1* 9.1* 8.3* 8.2*  HCT 29.2* 25.6* 32.2* 28.7* 28.7*  MCV 73.4* 74.0*  --  73.4* 72.8*  PLT 232 223  --  231 230   Basic Metabolic Panel: Recent Labs  Lab 05/01/17 0145 05/02/17 0649 05/03/17 0410 05/04/17 0232  NA 136 140 139 136  K 3.8 4.9 3.8 3.7  CL 99* 106 100* 98*  CO2 29 28 29 27   GLUCOSE 305* 198* 237* 259*  BUN 21* 20 22* 28*  CREATININE 1.07 0.92 1.05 1.11  CALCIUM 8.7* 8.5* 8.7* 8.5*    Liver Function Tests: No results for input(s): AST, ALT, ALKPHOS, BILITOT, PROT, ALBUMIN in the last 168 hours. No results for input(s): LIPASE, AMYLASE in the last 168 hours. No results for input(s): AMMONIA  in the last 168 hours. Coagulation Profile: Recent Labs  Lab 05/01/17 0606  INR 1.02   Cardiac Enzymes: Recent Labs  Lab 05/01/17 1530 05/01/17 1822 05/02/17 1850 05/03/17 0044 05/03/17 0410  TROPONINI <0.03 0.03* 0.14* 0.12* 0.14*   BNP (last 3 results) No results for input(s): PROBNP in the last 8760 hours. CBG: No results for input(s): GLUCAP in the last 168 hours. Studies: No results found.  Scheduled Meds: . apixaban  5 mg Oral BID  . benzonatate  100 mg Oral TID  . budesonide  0.5 mg Nebulization BID  . diltiazem  90 mg Oral Q6H  . doxycycline  100 mg Oral Q12H  . feeding supplement (ENSURE ENLIVE)  237 mL Oral TID BM  . gabapentin  100 mg Oral BID  . guaiFENesin  600 mg Oral BID  . ipratropium  0.5 mg Nebulization TID  . levalbuterol  1.25 mg Nebulization TID  . methylPREDNISolone (SOLU-MEDROL) injection  60 mg Intravenous Q12H  . montelukast  4 mg Oral QHS  . nicotine  21 mg Transdermal Daily  . pantoprazole   40 mg Oral Daily  . pramipexole  0.125 mg Oral QHS  . rosuvastatin  20 mg Oral QHS   Continuous Infusions: PRN Meds: acetaminophen, albuterol, chlorpheniramine-HYDROcodone, fluticasone, hydrALAZINE, menthol-cetylpyridinium, MUSCLE RUB, nitroGLYCERIN, ondansetron (ZOFRAN) IV, sodium chloride, zolpidem  Time spent: 35 minutes  Author: Lynden OxfordPranav Oluwademilade Mckiver, MD [facility] Pager: [phone] 05/04/2017 4:07 PM  If 7PM-7AM, please contact night-coverage at [email or web address], password [email or web address]

## 2017-05-04 NOTE — Discharge Instructions (Signed)

## 2017-05-04 NOTE — Progress Notes (Signed)
Progress Note  Patient Name: John Marsh Date of Encounter: 05/04/2017  Primary Cardiologist: John Marsh  Subjective   Lungs improving no cardiac symptoms   Inpatient Medications    Scheduled Meds: . budesonide  0.5 mg Nebulization BID  . feeding supplement (ENSURE ENLIVE)  237 mL Oral TID BM  . ipratropium  0.5 mg Nebulization TID  . levalbuterol  1.25 mg Nebulization TID  . methylPREDNISolone (SOLU-MEDROL) injection  60 mg Intravenous Q12H  . montelukast  4 mg Oral QHS  . nicotine  21 mg Transdermal Daily  . pantoprazole  40 mg Oral Daily  . pramipexole  0.125 mg Oral QHS  . rosuvastatin  20 mg Oral QHS   Continuous Infusions: . azithromycin Stopped (05/03/17 2355)  . cefTRIAXone (ROCEPHIN)  IV Stopped (05/03/17 2316)  . diltiazem (CARDIZEM) infusion 15 mg/hr (05/04/17 0547)  . heparin 900 Units/hr (05/04/17 0415)   PRN Meds: acetaminophen, albuterol, dextromethorphan-guaiFENesin, fluticasone, hydrALAZINE, MUSCLE RUB, nitroGLYCERIN, ondansetron (ZOFRAN) IV, sodium chloride, zolpidem   Vital Signs    Vitals:   05/04/17 0449 05/04/17 0500 05/04/17 0605 05/04/17 0755  BP:  (!) 129/58 117/63   Pulse:    84  Resp:   20 (!) 21  Temp: 97.7 F (36.5 C)     TempSrc: Oral     SpO2:    98%  Weight:      Height:        Intake/Output Summary (Last 24 hours) at 05/04/2017 0832 Last data filed at 05/04/2017 0454 Gross per 24 hour  Intake 1996.38 ml  Output 2000 ml  Net -3.62 ml   Filed Weights   05/01/17 0144  Weight: 172 lb (78 kg)    Telemetry    afib rates 100  - Personally Reviewed  ECG    afib nonspecific ST changes  - Personally Reviewed  Physical Exam  Chronically ill COPD er  GEN: No acute distress.   Neck: No JVD Cardiac: RRR, no murmurs, rubs, or gallops.  Respiratory: rhonchi and exp wheezing on auscultation bilaterally. GI: Soft, nontender, non-distended  MS: No edema; No deformity. Neuro:  Nonfocal  Psych: Normal affect   Labs      Chemistry Recent Labs  Lab 05/02/17 0649 05/03/17 0410 05/04/17 0232  NA 140 139 136  K 4.9 3.8 3.7  CL 106 100* 98*  CO2 28 29 27   GLUCOSE 198* 237* 259*  BUN 20 22* 28*  CREATININE 0.92 1.05 1.11  CALCIUM 8.5* 8.7* 8.5*  GFRNONAA >60 >60 >60  GFRAA >60 >60 >60  ANIONGAP 6 10 11      Hematology Recent Labs  Lab 05/02/17 0649 05/02/17 1427 05/03/17 0410 05/04/17 0232  WBC 13.1*  --  13.9* 12.3*  RBC 3.46*  --  3.91* 3.94*  HGB 7.1* 9.1* 8.3* 8.2*  HCT 25.6* 32.2* 28.7* 28.7*  MCV 74.0*  --  73.4* 72.8*  MCH 20.5*  --  21.2* 20.8*  MCHC 27.7*  --  28.9* 28.6*  RDW 17.2*  --  17.0* 16.8*  PLT 223  --  231 230    Cardiac Enzymes Recent Labs  Lab 05/01/17 1822 05/02/17 1850 05/03/17 0044 05/03/17 0410  TROPONINI 0.03* 0.14* 0.12* 0.14*    Recent Labs  Lab 05/01/17 0214  TROPIPOC 0.02     BNP Recent Labs  Lab 05/02/17 1850  BNP 481.7*     DDimer  Recent Labs  Lab 05/01/17 1146  DDIMER 0.33     Radiology  Ct Abdomen Pelvis W Contrast  Result Date: 05/03/2017 CLINICAL DATA:  Anemia.  Evaluate for retroperitoneal hemorrhage. EXAM: CT ABDOMEN AND PELVIS WITH CONTRAST TECHNIQUE: Multidetector CT imaging of the abdomen and pelvis was performed using the standard protocol following bolus administration of intravenous contrast. CONTRAST:  100mL ISOVUE-300 IOPAMIDOL (ISOVUE-300) INJECTION 61% COMPARISON:  CT angiogram 03/02/2004. FINDINGS: Lower chest:  Emphysema.  Calcified granulomata noted bilaterally. Hepatobiliary: No focal abnormality within the liver parenchyma. Gallbladder surgically absent. No intrahepatic or extrahepatic biliary dilation. Pancreas: No focal mass lesion. No dilatation of the main duct. No intraparenchymal cyst. No peripancreatic edema. Spleen: No splenomegaly. No focal mass lesion. Adrenals/Urinary Tract: No adrenal nodule or mass. Kidneys are unremarkable. No evidence for hydroureter. The urinary bladder appears normal for the  degree of distention. Stomach/Bowel: Stomach is nondistended. No gastric wall thickening. No evidence of outlet obstruction. Duodenum is normally positioned as is the ligament of Treitz. No small bowel wall thickening. No small bowel dilatation. The terminal ileum is normal. The appendix is normal. Prominent stool volume. Vascular/Lymphatic: There is abdominal aortic atherosclerosis without aneurysm. There is no gastrohepatic or hepatoduodenal ligament lymphadenopathy. No intraperitoneal or retroperitoneal lymphadenopathy. No pelvic sidewall lymphadenopathy. Reproductive: The prostate gland and seminal vesicles have normal imaging features. Other: No intraperitoneal free fluid. Musculoskeletal: Bone windows reveal no worrisome lytic or sclerotic osseous lesions. No evidence for retroperitoneal hemorrhage/ hematoma. IMPRESSION: 1. No CT findings to explain the patient's history of anemia. Specifically, no retroperitoneal hemorrhage or hematoma. 2.  Aortic Atherosclerois (ICD10-170.0) 3.  Emphysema. ([id number](ICD10-J43.9) Electronically Signed   By: John Marsh M.D.   On: 05/03/2017 15:12   Dg Chest Port 1 View  Result Date: 05/02/2017 CLINICAL DATA:  Shortness of Breath EXAM: PORTABLE CHEST 1 VIEW COMPARISON:  Chest CT May 01, 2017 and chest radiograph May 01, 2017 FINDINGS: There is underlying emphysematous change with bullous disease in the upper lobes. There is a small calcified granuloma in the left upper lobe. There is no edema or consolidation. Heart is mildly enlarged. The pulmonary vascularity reflects the underlying emphysematous change with decreased vascularity to the upper lobes. No adenopathy. No bone lesions. IMPRESSION: Underlying emphysematous change. No edema or consolidation. Stable cardiac silhouette with heart mildly enlarged given degree of underlying emphysema. Small calcified granuloma left upper lobe. Emphysema (ICD10-J43.9). Electronically Signed   By: Bretta BangWilliam  Woodruff Marsh M.D.   On:  05/02/2017 14:40    Cardiac Studies   Echo 09/21/16  EF normal 60-65%   Patient Profile     71 y.o. male with gold 4 COPD on home oxygen. Admitted with exacerbation requiring steroids, nebs and antibiotics. Developed new onset Afib.CHA2VASC 3 currently on iv cardizem and heparin  Assessment & Plan    1) PAF:  Rate control ok on cardizem continue heparin will change over to eliquis 5 bid and oral cardizem 60 mg q 6  Ok to use xopenex for breathing or even other Beta agonist as the relief in bronchospasm will offset any propensity to increase HR  For questions or updates, please contact [facility] Please consult [email or web address] for contact info under Cardiology/STEMI.      Signed, Charlton HawsPeter Britney Captain, MD  05/04/2017, 8:32 AM

## 2017-05-04 NOTE — Progress Notes (Signed)
[facility] Progress Note  Harvie HeckHugh T Godsil 71 y.o. [birth year]  CC:  Significant drop in hemoglobin   Subjective: Patient is complaining of significant cough and leg pain. Denied any GI symptoms. Denied blood in the stool or black stool.  ROS : Negative for chest pain. Positive for shortness of breath and weakness   Objective: Vital signs in last 24 hours: Vitals:   05/04/17 0755 05/04/17 0848  BP:  (!) 127/59  Pulse: 84   Resp: (!) 21   Temp:  97.8 F (36.6 C)  SpO2: 98%     Physical Exam:  General:  Alert, cooperative, no distress, appears stated age, oxygen by nasal cannula   Head:  Normocephalic, without obvious abnormality, atraumatic  Eyes:  , EOM's intact,   Lungs:   Decreased breath sounds bilaterally   Heart:  Regular rate and rhythm, S1, S2 normal  Abdomen:   Soft, non-tender, bowel sounds present  no masses, no peritoneal signs   Extremities: Extremities normal, atraumatic, no  edema  Pulses: 2+ and symmetric    Lab Results: Recent Labs    05/03/17 0410 05/04/17 0232  NA 139 136  K 3.8 3.7  CL 100* 98*  CO2 29 27  GLUCOSE 237* 259*  BUN 22* 28*  CREATININE 1.05 1.11  CALCIUM 8.7* 8.5*   No results for input(s): AST, ALT, ALKPHOS, BILITOT, PROT, ALBUMIN in the last 72 hours. Recent Labs    05/03/17 0410 05/04/17 0232  WBC 13.9* 12.3*  NEUTROABS 12.9* 11.2*  HGB 8.3* 8.2*  HCT 28.7* 28.7*  MCV 73.4* 72.8*  PLT 231 230   No results for input(s): LABPROT, INR in the last 72 hours.    Assessment/Plan: - Significant drop in hemoglobin without any overt bleeding. Occult blood negative. CT abdomen pelvis negative for any bleeding source - Respiratory failure - Atrial fibrillation. Currently on heparin drip. - Blood transfusion reaction - History of coronary artery disease. History of COPD  Recommendations -------------------------- -  CT abdomen pelvis negative for retroperitoneal hemorrhage. - No evidence of overt bleeding despite  of being on heparin drip - Okay to switch to oral anticoagulation from GI standpoint. - GI will sign off. Call us back if needed - Follow-up in GI clinic in 2 months.  Kathi DerParag Owyn Raulston MD, FACP 05/04/2017, 10:48 AM  Contact #  [phone]

## 2017-05-05 LAB — BASIC METABOLIC PANEL
ANION GAP: 9 (ref 5–15)
BUN: 27 mg/dL — ABNORMAL HIGH (ref 6–20)
CALCIUM: 8.4 mg/dL — AB (ref 8.9–10.3)
CO2: 28 mmol/L (ref 22–32)
Chloride: 99 mmol/L — ABNORMAL LOW (ref 101–111)
Creatinine, Ser: 1.01 mg/dL (ref 0.61–1.24)
GLUCOSE: 290 mg/dL — AB (ref 65–99)
POTASSIUM: 3.8 mmol/L (ref 3.5–5.1)
SODIUM: 136 mmol/L (ref 135–145)

## 2017-05-05 LAB — MAGNESIUM: MAGNESIUM: 2.3 mg/dL (ref 1.7–2.4)

## 2017-05-05 LAB — CBC
HCT: 27.8 % — ABNORMAL LOW (ref 39.0–52.0)
HEMOGLOBIN: 7.8 g/dL — AB (ref 13.0–17.0)
MCH: 20.4 pg — AB (ref 26.0–34.0)
MCHC: 28.1 g/dL — ABNORMAL LOW (ref 30.0–36.0)
MCV: 72.6 fL — ABNORMAL LOW (ref 78.0–100.0)
PLATELETS: 212 10*3/uL (ref 150–400)
RBC: 3.83 MIL/uL — AB (ref 4.22–5.81)
RDW: 17 % — ABNORMAL HIGH (ref 11.5–15.5)
WBC: 9.8 10*3/uL (ref 4.0–10.5)

## 2017-05-05 MED ORDER — PREDNISONE 50 MG PO TABS
50.0000 mg | ORAL_TABLET | Freq: Every day | ORAL | Status: DC
  Administered 2017-05-06: 50 mg via ORAL
  Filled 2017-05-05 (×2): qty 1

## 2017-05-05 MED ORDER — FUROSEMIDE 40 MG PO TABS: 40.0000 mg | ORAL_TABLET | Freq: Every day | ORAL | Status: DC

## 2017-05-05 MED ORDER — FUROSEMIDE 10 MG/ML IJ SOLN
40.0000 mg | Freq: Once | INTRAMUSCULAR | Status: DC
  Administered 2017-05-05: 40 mg via INTRAVENOUS

## 2017-05-05 MED ORDER — ALBUTEROL SULFATE HFA 108 (90 BASE) MCG/ACT IN AERS
2.0000 | INHALATION_SPRAY | Freq: Four times a day (QID) | RESPIRATORY_TRACT | Status: DC | PRN
  Filled 2017-05-05: qty 6.7

## 2017-05-05 MED ORDER — DILTIAZEM HCL ER COATED BEADS 180 MG PO CP24
360.0000 mg | ORAL_CAPSULE | Freq: Every day | ORAL | Status: DC
  Administered 2017-05-06: 360 mg via ORAL
  Filled 2017-05-05 (×2): qty 2

## 2017-05-05 NOTE — Progress Notes (Signed)
RN called to pt's room 14:30 r/t "difficulty breathing." Pt Sats>92 on 3L, shaking and anxious. Breathing tx given, comfort with warm blankets. Pt dangling at bedside with safe chair in front to rest his head on. Will monitor for ease in anxiety.

## 2017-05-05 NOTE — Progress Notes (Signed)
Patient complains of shortness of breath, RN called respiratory to verify plan of care for respiratory treatments. Respiratory has delivered nebs already. Rn increased oxygen by 1L, informed patient to sit down and catch his breath (patient keeps getting up and moving around), RN also called MD to get an order for albuterol inhaler since patient brought it from home and was planning on using it.

## 2017-05-05 NOTE — Plan of Care (Signed)
  Activity: Ability to tolerate increased activity will improve 05/05/2017 2250 - Progressing by Jeanella Flatteryhomas, Christinea Brizuela T, RN

## 2017-05-05 NOTE — Progress Notes (Signed)
Progress Note  Patient Name: John Marsh Date of Encounter: 05/05/2017  Primary Cardiologist: Katrinka BlazingSmith  Subjective   Feels much better, breathing greatly improved. Had one bout of diarrhea. No CV complaints. Not aware of palpitations. 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  Inpatient Medications    Scheduled Meds: . apixaban  5 mg Oral BID  . benzonatate  100 mg Oral TID  . budesonide  0.5 mg Nebulization BID  . diltiazem  90 mg Oral Q6H  . doxycycline  100 mg Oral Q12H  . feeding supplement (ENSURE ENLIVE)  237 mL Oral TID BM  . gabapentin  100 mg Oral BID  . guaiFENesin  600 mg Oral BID  . ipratropium  0.5 mg Nebulization TID  . levalbuterol  1.25 mg Nebulization TID  . montelukast  4 mg Oral QHS  . nicotine  21 mg Transdermal Daily  . pantoprazole  40 mg Oral Daily  . pramipexole  0.125 mg Oral QHS  . [START ON 05/06/2017] predniSONE  50 mg Oral Q breakfast  . rosuvastatin  20 mg Oral QHS   Continuous Infusions:  PRN Meds: acetaminophen, albuterol, chlorpheniramine-HYDROcodone, fluticasone, hydrALAZINE, menthol-cetylpyridinium, MUSCLE RUB, nitroGLYCERIN, ondansetron (ZOFRAN) IV, sodium chloride, zolpidem   Vital Signs    Vitals:   05/05/17 0357 05/05/17 0637 05/05/17 0745 05/05/17 1200  BP: (!) 111/54 121/64    Pulse: 85     Resp: 15     Temp: 97.8 F (36.6 C)     TempSrc: Oral     SpO2: 100%  98% 98%  Weight:      Height:        Intake/Output Summary (Last 24 hours) at 05/05/2017 1244 Last data filed at 05/05/2017 0800 Gross per 24 hour  Intake 600 ml  Output 800 ml  Net -200 ml   Filed Weights   05/01/17 0144  Weight: 172 lb (78 kg)    Telemetry    Atrial flutter with 4:1 AV block - Personally Reviewed  ECG    Atrial flutter with 2:1and variable AV block - Personally Reviewed  Physical Exam   GEN: No acute distress.   Neck: No JVD Cardiac: RRR, no murmurs, rubs, or gallops.  Respiratory: Clear to  auscultation bilaterally, but diminished breath sounds throughout. GI: Soft, nontender, non-distended  MS: No edema; No deformity. Neuro:  Nonfocal  Psych: Normal affect   Labs    Chemistry Recent Labs  Lab 05/03/17 0410 05/04/17 0232 05/05/17 0243  NA 139 136 136  K 3.8 3.7 3.8  CL 100* 98* 99*  CO2 29 27 28   GLUCOSE 237* 259* 290*  BUN 22* 28* 27*  CREATININE 1.05 1.11 1.01  CALCIUM 8.7* 8.5* 8.4*  GFRNONAA >60 >60 >60  GFRAA >60 >60 >60  ANIONGAP 10 11 9      Hematology Recent Labs  Lab 05/03/17 0410 05/04/17 0232 05/05/17 0243  WBC 13.9* 12.3* 9.8  RBC 3.91* 3.94* 3.83*  HGB 8.3* 8.2* 7.8*  HCT 28.7* 28.7* 27.8*  MCV 73.4* 72.8* 72.6*  MCH 21.2* 20.8* 20.4*  MCHC 28.9* 28.6* 28.1*  RDW 17.0* 16.8* 17.0*  PLT 231 230 212    Cardiac Enzymes Recent Labs  Lab 05/01/17 1822 05/02/17 1850 05/03/17 0044 05/03/17 0410  TROPONINI 0.03* 0.14* 0.12* 0.14*    Recent Labs  Lab 05/01/17 0214  TROPIPOC 0.02     BNP Recent Labs  Lab 05/02/17 1850  BNP 481.7*     DDimer  Recent Labs  Lab 05/01/17 1146  DDIMER 0.33     Radiology    Ct Abdomen Pelvis W Contrast  Result Date: 05/03/2017 CLINICAL DATA:  Anemia.  Evaluate for retroperitoneal hemorrhage. EXAM: CT ABDOMEN AND PELVIS WITH CONTRAST TECHNIQUE: Multidetector CT imaging of the abdomen and pelvis was performed using the standard protocol following bolus administration of intravenous contrast. CONTRAST:  100mL ISOVUE-300 IOPAMIDOL (ISOVUE-300) INJECTION 61% COMPARISON:  CT angiogram 03/02/2004. FINDINGS: Lower chest:  Emphysema.  Calcified granulomata noted bilaterally. Hepatobiliary: No focal abnormality within the liver parenchyma. Gallbladder surgically absent. No intrahepatic or extrahepatic biliary dilation. Pancreas: No focal mass lesion. No dilatation of the main duct. No intraparenchymal cyst. No peripancreatic edema. Spleen: No splenomegaly. No focal mass lesion. Adrenals/Urinary Tract: No  adrenal nodule or mass. Kidneys are unremarkable. No evidence for hydroureter. The urinary bladder appears normal for the degree of distention. Stomach/Bowel: Stomach is nondistended. No gastric wall thickening. No evidence of outlet obstruction. Duodenum is normally positioned as is the ligament of Treitz. No small bowel wall thickening. No small bowel dilatation. The terminal ileum is normal. The appendix is normal. Prominent stool volume. Vascular/Lymphatic: There is abdominal aortic atherosclerosis without aneurysm. There is no gastrohepatic or hepatoduodenal ligament lymphadenopathy. No intraperitoneal or retroperitoneal lymphadenopathy. No pelvic sidewall lymphadenopathy. Reproductive: The prostate gland and seminal vesicles have normal imaging features. Other: No intraperitoneal free fluid. Musculoskeletal: Bone windows reveal no worrisome lytic or sclerotic osseous lesions. No evidence for retroperitoneal hemorrhage/ hematoma. IMPRESSION: 1. No CT findings to explain the patient's history of anemia. Specifically, no retroperitoneal hemorrhage or hematoma. 2.  Aortic Atherosclerois (ICD10-170.0) 3.  Emphysema. ([id number](ICD10-J43.9) Electronically Signed   By: Kennith CenterEric  Mansell M.D.   On: 05/03/2017 15:12    Cardiac Studies   I have reviewed all his ECGs and telemetry tracings. There is a loy of motion artifact at times, but I believe he has always been in typical counterclockwise right atrial flutter, with varying degrees of AV block    Patient Profile     71 y.o. male with severe COPD and acute exacerbation compliacted by atrial flutter with RVR  Assessment & Plan    1. AFlutter:  Well rate controlled, on anticoagulants. Will switch to equivalent dose of sustained release diltiazem. If he remains in AFlutter, there is an option for referral for RF ablation. 2. COPD: resolving exacerbation and sepsis. Arrhythmia may abate with improving function. 3. CAD: remote problems with CAD, asymptomatic for years  (previous patient of Bruce Brodie's). Normal LVEF. On highly effective statin. 4. Fe deficiency anemia: may limit long term use of anticoagulation, which would make RF ablation for atrial flutter an appealing option. 5. Anticoagulation: stop ASA and clopidogrel. FOBT negative.  For questions or updates, please contact [facility] Please consult [email or web address] for contact info under Cardiology/STEMI.      Signed, Thurmon FairMihai Laiya Wisby, MD  05/05/2017, 12:44 PM

## 2017-05-05 NOTE — Progress Notes (Signed)
[facility] Progress Note  Patient: John Marsh:[id number]   PCP: Eartha Inch, MD DOB: Feb 20, 1946   DOA: 05/01/2017   DOS: 05/05/2017   Date of Service: the patient was seen and examined on 05/05/2017  Subjective: Feeling better, significant improvement in his breathing.  No chest pain no abdominal pain.  Pain in the leg is also improved.  Cough has almost resolved.  Still talking in short sentences.  Brief hospital course: Pt. with PMH of COPD, chronic respiratory failure, 3 L oxygen at home, HTN, HLD, CAD, PVD, GERD; admitted on 05/01/2017, presented with complaint of shortness of breath and cough, was found to have COPD exacerbation with pneumonia. Currently further plan is continue current management.  Assessment and Plan: 1.  Acute hypoxic respiratory failure on chronic. COPD exacerbation. Acute bronchitis CT chest PE protocol is negative for any pulmonary embolism, shows possible right pulmonary nodule. Urine streptococcal pneumonia and Legionella all negative Flu PCR, respiratory virus panel all negative Blood culture x2, no growth so far Repeat chest x-ray shows no infiltrates, just emphysematous changes Continue Atrovent and Xopenex nebs Continue home Pulmicort nebulizer, Spiriva inhaler and Singulair Changing IV Solu-Medrol to oral prednisone starting tomorrow. Mucinex for cough Patient was on IV Rocephin and azithromycin , changing to oral doxycycline Continue oxygen, transfer to telemetry today. Continous pulse ox  2.  New onset A. fib. Chads score is 3. Started on IV heparin initially and currently on oral Eliquis. Also started on oral Cardizem.  Due to persistent tachycardia dose was increased. Currently on daily dose Cardizem.  Transferring out to telemetry. Cardiology recommends considering atrial flutter ablation, most likely as an outpatient once stable.  3.  Elevated troponin. Demand ischemia.  Troponins peaked to 0.14.  No intervention  required for now.  4.  Iron deficiency anemia. Patient was on high-dose ibuprofen at home, he did have a significant drop in hemoglobin from 13-8. FOBT negative, no active bleeding reported. CT abdomen pelvis without contrast negative for any acute intra-abdominal bleeding. Hemoglobin remained stable despite being on heparin as well as currently on Eliquis. At this point GI was consulted and recommend no further inpatient workup. Outpatient follow-up. Cardiology would recommend resumption of Plavix and aspirin  5.  Chronic back pain. Patient complains about worsening of his chronic back pain. Had initial evaluation with vascular surgery back in January 2018. Symptoms appears to be positive for a neurological pain, probable sciatica. We will start the patient on gabapentin and recommend outpatient follow-up with neurology as recommended by vascular surgery earlier.  May also benefit from physical therapy on discharge.  6.  Essential hypertension. Blood pressure stable. On Cardizem.  Monitor.  Diet: Cardiac diet DVT Prophylaxis: on therapeutic anticoagulation.  Advance goals of care discussion: full code  Family Communication: family was present at bedside, at the time of interview. The pt provided permission to discuss medical plan with the family. Opportunity was given to ask question and all questions were answered satisfactorily.   Disposition:  Discharge to home.  Consultants: cardiology, gastroenterology Procedures: Echocardiogram  Antibiotics: Anti-infectives (From admission, onward)   Start     Dose/Rate Route Frequency Ordered Stop   05/04/17 1130  doxycycline (VIBRA-TABS) tablet 100 mg     100 mg Oral Every 12 hours 05/04/17 1030     05/01/17 2200  azithromycin (ZITHROMAX) 500 mg in dextrose 5 % 250 mL IVPB  Status:  Discontinued     500 mg 250 mL/hr over 60 Minutes Intravenous Every 24 hours  05/01/17 0443 05/04/17 1029   05/01/17 2200  cefTRIAXone (ROCEPHIN) 1 g in  dextrose 5 % 50 mL IVPB  Status:  Discontinued     1 g 100 mL/hr over 30 Minutes Intravenous Every 24 hours 05/01/17 0443 05/04/17 1029   05/01/17 0445  cefTRIAXone (ROCEPHIN) 1 g in dextrose 5 % 50 mL IVPB  Status:  Discontinued     1 g 100 mL/hr over 30 Minutes Intravenous Every 24 hours 05/01/17 0442 05/01/17 0443   05/01/17 0445  azithromycin (ZITHROMAX) 500 mg in dextrose 5 % 250 mL IVPB  Status:  Discontinued     500 mg 250 mL/hr over 60 Minutes Intravenous Every 24 hours 05/01/17 0442 05/01/17 0443   05/01/17 0230  cefTRIAXone (ROCEPHIN) 1 g in dextrose 5 % 50 mL IVPB     1 g 100 mL/hr over 30 Minutes Intravenous  Once 05/01/17 0228 05/01/17 0330   05/01/17 0230  azithromycin (ZITHROMAX) 500 mg in dextrose 5 % 250 mL IVPB     500 mg 250 mL/hr over 60 Minutes Intravenous  Once 05/01/17 0228 05/01/17 0757       Objective: Physical Exam: Vitals:   05/05/17 0637 05/05/17 0745 05/05/17 1200 05/05/17 1511  BP: 121/64     Pulse:      Resp:      Temp:      TempSrc:      SpO2:  98% 98% 99%  Weight:      Height:        Intake/Output Summary (Last 24 hours) at 05/05/2017 1535 Last data filed at 05/05/2017 0800 Gross per 24 hour  Intake 600 ml  Output 800 ml  Net -200 ml   Filed Weights   05/01/17 0144  Weight: 78 kg (172 lb)   General: Alert, Awake and Oriented to Time, Place and Person. Appear in marked distress, affect appropriate Eyes: PERRL, Conjunctiva normal ENT: Oral Mucosa clear moist Neck: no JVD, no Abnormal Mass Or lumps Cardiovascular: S1 and S2 Present, no Murmur, Peripheral Pulses Present Respiratory: increased respiratory effort, Bilateral Air entry equal and Decreased, positive use of accessory muscle, basal Crackles, bilateral expiratory wheezes Abdomen: Bowel Sound present, Soft and no tenderness, no hernia Skin: n redness, no Rash, no induration Extremities: bilateral Pedal edema, no calf tenderness Neurologic: Grossly no focal neuro deficit.  Bilaterally Equal motor strength  Data Reviewed: CBC: Recent Labs  Lab 05/01/17 0145 05/02/17 0649 05/02/17 1427 05/03/17 0410 05/04/17 0232 05/05/17 0243  WBC 7.7 13.1*  --  13.9* 12.3* 9.8  NEUTROABS 5.7 11.9*  --  12.9* 11.2*  --   HGB 8.2* 7.1* 9.1* 8.3* 8.2* 7.8*  HCT 29.2* 25.6* 32.2* 28.7* 28.7* 27.8*  MCV 73.4* 74.0*  --  73.4* 72.8* 72.6*  PLT 232 223  --  231 230 212   Basic Metabolic Panel: Recent Labs  Lab 05/01/17 0145 05/02/17 0649 05/03/17 0410 05/04/17 0232 05/05/17 0243  NA 136 140 139 136 136  K 3.8 4.9 3.8 3.7 3.8  CL 99* 106 100* 98* 99*  CO2 29 28 29 27 28   GLUCOSE 305* 198* 237* 259* 290*  BUN 21* 20 22* 28* 27*  CREATININE 1.07 0.92 1.05 1.11 1.01  CALCIUM 8.7* 8.5* 8.7* 8.5* 8.4*  MG  --   --   --   --  2.3    Liver Function Tests: No results for input(s): AST, ALT, ALKPHOS, BILITOT, PROT, ALBUMIN in the last 168 hours. No results for input(s): LIPASE, AMYLASE  in the last 168 hours. No results for input(s): AMMONIA in the last 168 hours. Coagulation Profile: Recent Labs  Lab 05/01/17 0606  INR 1.02   Cardiac Enzymes: Recent Labs  Lab 05/01/17 1530 05/01/17 1822 05/02/17 1850 05/03/17 0044 05/03/17 0410  TROPONINI <0.03 0.03* 0.14* 0.12* 0.14*   BNP (last 3 results) No results for input(s): PROBNP in the last 8760 hours. CBG: No results for input(s): GLUCAP in the last 168 hours. Studies: No results found.  Scheduled Meds: . apixaban  5 mg Oral BID  . benzonatate  100 mg Oral TID  . budesonide  0.5 mg Nebulization BID  . [START ON 05/06/2017] diltiazem  360 mg Oral Daily  . diltiazem  90 mg Oral Q6H  . doxycycline  100 mg Oral Q12H  . feeding supplement (ENSURE ENLIVE)  237 mL Oral TID BM  . gabapentin  100 mg Oral BID  . guaiFENesin  600 mg Oral BID  . ipratropium  0.5 mg Nebulization TID  . levalbuterol  1.25 mg Nebulization TID  . montelukast  4 mg Oral QHS  . nicotine  21 mg Transdermal Daily  . pantoprazole  40  mg Oral Daily  . pramipexole  0.125 mg Oral QHS  . [START ON 05/06/2017] predniSONE  50 mg Oral Q breakfast  . rosuvastatin  20 mg Oral QHS   Continuous Infusions: PRN Meds: acetaminophen, albuterol, chlorpheniramine-HYDROcodone, fluticasone, hydrALAZINE, menthol-cetylpyridinium, MUSCLE RUB, nitroGLYCERIN, ondansetron (ZOFRAN) IV, sodium chloride, zolpidem  Time spent: 35 minutes  Author: Lynden OxfordPranav Andreal Vultaggio, MD [facility] Pager: [phone] 05/05/2017 3:35 PM  If 7PM-7AM, please contact night-coverage at [email or web address], password [email or web address]

## 2017-05-06 ENCOUNTER — Emergency Department (HOSPITAL_COMMUNITY): Payer: Medicare Other

## 2017-05-06 ENCOUNTER — Inpatient Hospital Stay (HOSPITAL_COMMUNITY)
Admission: EM | Admit: 2017-05-06 | Discharge: 2017-05-09 | DRG: 190 | Disposition: A | Discharge status: Home / Self Care | Payer: Medicare Other | Attending: Internal Medicine | Admitting: Internal Medicine

## 2017-05-06 ENCOUNTER — Encounter (HOSPITAL_COMMUNITY): Payer: Self-pay

## 2017-05-06 ENCOUNTER — Other Ambulatory Visit: Payer: Self-pay

## 2017-05-06 DIAGNOSIS — Z7951 Long term (current) use of inhaled steroids: Secondary | ICD-10-CM

## 2017-05-06 DIAGNOSIS — I483 Typical atrial flutter: Secondary | ICD-10-CM

## 2017-05-06 DIAGNOSIS — I700 Atherosclerosis of aorta: Secondary | ICD-10-CM | POA: Diagnosis present

## 2017-05-06 DIAGNOSIS — D649 Anemia, unspecified: Secondary | ICD-10-CM

## 2017-05-06 DIAGNOSIS — R911 Solitary pulmonary nodule: Secondary | ICD-10-CM | POA: Diagnosis present

## 2017-05-06 DIAGNOSIS — E1151 Type 2 diabetes mellitus with diabetic peripheral angiopathy without gangrene: Secondary | ICD-10-CM | POA: Diagnosis present

## 2017-05-06 DIAGNOSIS — R000 Tachycardia, unspecified: Secondary | ICD-10-CM | POA: Diagnosis present

## 2017-05-06 DIAGNOSIS — J441 Chronic obstructive pulmonary disease with (acute) exacerbation: Secondary | ICD-10-CM | POA: Diagnosis present

## 2017-05-06 DIAGNOSIS — Z7901 Long term (current) use of anticoagulants: Secondary | ICD-10-CM

## 2017-05-06 DIAGNOSIS — I4892 Unspecified atrial flutter: Secondary | ICD-10-CM | POA: Diagnosis present

## 2017-05-06 DIAGNOSIS — E1165 Type 2 diabetes mellitus with hyperglycemia: Secondary | ICD-10-CM | POA: Diagnosis present

## 2017-05-06 DIAGNOSIS — T380X5A Adverse effect of glucocorticoids and synthetic analogues, initial encounter: Secondary | ICD-10-CM | POA: Diagnosis present

## 2017-05-06 DIAGNOSIS — J471 Bronchiectasis with (acute) exacerbation: Secondary | ICD-10-CM

## 2017-05-06 DIAGNOSIS — I110 Hypertensive heart disease with heart failure: Secondary | ICD-10-CM | POA: Diagnosis present

## 2017-05-06 DIAGNOSIS — I252 Old myocardial infarction: Secondary | ICD-10-CM

## 2017-05-06 DIAGNOSIS — F17210 Nicotine dependence, cigarettes, uncomplicated: Secondary | ICD-10-CM | POA: Diagnosis present

## 2017-05-06 DIAGNOSIS — I5032 Chronic diastolic (congestive) heart failure: Secondary | ICD-10-CM

## 2017-05-06 DIAGNOSIS — J449 Chronic obstructive pulmonary disease, unspecified: Secondary | ICD-10-CM | POA: Diagnosis present

## 2017-05-06 DIAGNOSIS — E785 Hyperlipidemia, unspecified: Secondary | ICD-10-CM | POA: Diagnosis present

## 2017-05-06 DIAGNOSIS — M549 Dorsalgia, unspecified: Secondary | ICD-10-CM | POA: Diagnosis present

## 2017-05-06 DIAGNOSIS — G8929 Other chronic pain: Secondary | ICD-10-CM | POA: Diagnosis present

## 2017-05-06 DIAGNOSIS — J9621 Acute and chronic respiratory failure with hypoxia: Secondary | ICD-10-CM | POA: Diagnosis present

## 2017-05-06 DIAGNOSIS — J439 Emphysema, unspecified: Principal | ICD-10-CM | POA: Diagnosis present

## 2017-05-06 DIAGNOSIS — Z8249 Family history of ischemic heart disease and other diseases of the circulatory system: Secondary | ICD-10-CM

## 2017-05-06 DIAGNOSIS — D72829 Elevated white blood cell count, unspecified: Secondary | ICD-10-CM

## 2017-05-06 DIAGNOSIS — Z9049 Acquired absence of other specified parts of digestive tract: Secondary | ICD-10-CM

## 2017-05-06 DIAGNOSIS — J209 Acute bronchitis, unspecified: Secondary | ICD-10-CM | POA: Diagnosis present

## 2017-05-06 DIAGNOSIS — D509 Iron deficiency anemia, unspecified: Secondary | ICD-10-CM | POA: Diagnosis present

## 2017-05-06 DIAGNOSIS — Z9981 Dependence on supplemental oxygen: Secondary | ICD-10-CM

## 2017-05-06 DIAGNOSIS — J470 Bronchiectasis with acute lower respiratory infection: Secondary | ICD-10-CM | POA: Diagnosis present

## 2017-05-06 DIAGNOSIS — I2510 Atherosclerotic heart disease of native coronary artery without angina pectoris: Secondary | ICD-10-CM | POA: Diagnosis present

## 2017-05-06 DIAGNOSIS — I4891 Unspecified atrial fibrillation: Secondary | ICD-10-CM | POA: Diagnosis present

## 2017-05-06 DIAGNOSIS — K219 Gastro-esophageal reflux disease without esophagitis: Secondary | ICD-10-CM | POA: Diagnosis present

## 2017-05-06 LAB — BASIC METABOLIC PANEL
Anion gap: 7 (ref 5–15)
Anion gap: 9 (ref 5–15)
BUN: 31 mg/dL — ABNORMAL HIGH (ref 6–20)
BUN: 30 mg/dL — ABNORMAL HIGH (ref 6–20)
CO2: 32 mmol/L (ref 22–32)
CO2: 29 mmol/L (ref 22–32)
Calcium: 8.5 mg/dL — ABNORMAL LOW (ref 8.9–10.3)
Calcium: 9 mg/dL (ref 8.9–10.3)
Chloride: 100 mmol/L — ABNORMAL LOW (ref 101–111)
Chloride: 100 mmol/L — ABNORMAL LOW (ref 101–111)
Creatinine, Ser: 0.98 mg/dL (ref 0.61–1.24)
Creatinine, Ser: 1.21 mg/dL (ref 0.61–1.24)
GFR calc Af Amer: >60 mL/min (ref >60)
GFR calc Af Amer: >60 mL/min (ref >60)
GFR calc non Af Amer: >60 mL/min (ref >60)
GFR calc non Af Amer: 58 mL/min — ABNORMAL LOW (ref >60)
Glucose, Bld: 290 mg/dL — ABNORMAL HIGH (ref 65–99)
Glucose, Bld: 471 mg/dL — ABNORMAL HIGH (ref 65–99)
Potassium: 3.6 mmol/L (ref 3.5–5.1)
Potassium: 3.9 mmol/L (ref 3.5–5.1)
Sodium: 139 mmol/L (ref 135–145)
Sodium: 138 mmol/L (ref 135–145)

## 2017-05-06 LAB — TYPE AND SCREEN
ABO/RH(D): A NEG
ANTIBODY SCREEN: NEGATIVE
UNIT DIVISION: 0
UNIT DIVISION: 0
Unit division: 0
Unit division: 0

## 2017-05-06 LAB — CBC
HCT: 27.9 % — ABNORMAL LOW (ref 39.0–52.0)
HCT: 31.5 % — ABNORMAL LOW (ref 39.0–52.0)
Hemoglobin: 8 g/dL — ABNORMAL LOW (ref 13.0–17.0)
Hemoglobin: 9 g/dL — ABNORMAL LOW (ref 13.0–17.0)
MCH: 20.8 pg — ABNORMAL LOW (ref 26.0–34.0)
MCH: 20.9 pg — ABNORMAL LOW (ref 26.0–34.0)
MCHC: 28.7 g/dL — ABNORMAL LOW (ref 30.0–36.0)
MCHC: 28.6 g/dL — ABNORMAL LOW (ref 30.0–36.0)
MCV: 72.5 fL — ABNORMAL LOW (ref 78.0–100.0)
MCV: 73.3 fL — ABNORMAL LOW (ref 78.0–100.0)
PLATELETS: 210 10*3/uL (ref 150–400)
Platelets: 260 10*3/uL (ref 150–400)
RBC: 3.85 MIL/uL — ABNORMAL LOW (ref 4.22–5.81)
RBC: 4.3 MIL/uL (ref 4.22–5.81)
RDW: 17.1 % — AB (ref 11.5–15.5)
RDW: 17.4 % — ABNORMAL HIGH (ref 11.5–15.5)
WBC: 10.5 10*3/uL (ref 4.0–10.5)
WBC: 15.7 10*3/uL — ABNORMAL HIGH (ref 4.0–10.5)

## 2017-05-06 LAB — CULTURE, BLOOD (ROUTINE X 2)
Culture: NO GROWTH
Culture: NO GROWTH

## 2017-05-06 LAB — BPAM RBC
BLOOD PRODUCT EXPIRATION DATE: 201812042359
Blood Product Expiration Date: 201812042359
Blood Product Expiration Date: 201812042359
Blood Product Expiration Date: 201812052359
ISSUE DATE / TIME: 201811141101
ISSUE DATE / TIME: 201811121454
UNIT TYPE AND RH: 600
UNIT TYPE AND RH: 600
Unit Type and Rh: 600
Unit Type and Rh: 600

## 2017-05-06 LAB — I-STAT TROPONIN, ED: Troponin i, poc: 0.06 ng/mL (ref 0.00–0.08)

## 2017-05-06 MED ORDER — GABAPENTIN 100 MG PO CAPS: 100.0000 mg | ORAL_CAPSULE | Freq: Two times a day (BID) | ORAL | 0 refills | Status: AC

## 2017-05-06 MED ORDER — IPRATROPIUM BROMIDE 0.02 % IN SOLN
0.5000 mg | Freq: Once | RESPIRATORY_TRACT | Status: AC
  Administered 2017-05-07: 0.5 mg via RESPIRATORY_TRACT
  Filled 2017-05-06: qty 2.5

## 2017-05-06 MED ORDER — DILTIAZEM HCL ER COATED BEADS 360 MG PO CP24: 360.0000 mg | ORAL_CAPSULE | Freq: Every day | ORAL | 0 refills | Status: AC

## 2017-05-06 MED ORDER — BENZONATATE 100 MG PO CAPS: 100.0000 mg | ORAL_CAPSULE | Freq: Three times a day (TID) | ORAL | 0 refills | Status: DC

## 2017-05-06 MED ORDER — LEVALBUTEROL HCL 1.25 MG/0.5ML IN NEBU: 1.2500 mg | INHALATION_SOLUTION | Freq: Four times a day (QID) | RESPIRATORY_TRACT | 12 refills | Status: DC | PRN

## 2017-05-06 MED ORDER — METHYLPREDNISOLONE SODIUM SUCC 125 MG IJ SOLR
125.0000 mg | Freq: Once | INTRAMUSCULAR | Status: AC
  Administered 2017-05-07: 125 mg via INTRAVENOUS
  Filled 2017-05-06: qty 2

## 2017-05-06 MED ORDER — ALBUTEROL (5 MG/ML) CONTINUOUS INHALATION SOLN
10.0000 mg/h | INHALATION_SOLUTION | Freq: Once | RESPIRATORY_TRACT | Status: AC
  Administered 2017-05-06: 10 mg/h via RESPIRATORY_TRACT
  Filled 2017-05-06: qty 2

## 2017-05-06 MED ORDER — DOXYCYCLINE HYCLATE 100 MG PO TABS: 100.0000 mg | ORAL_TABLET | Freq: Two times a day (BID) | ORAL | 0 refills | Status: DC

## 2017-05-06 MED ORDER — PREDNISONE 10 MG PO TABS: ORAL_TABLET | ORAL | 0 refills | Status: DC

## 2017-05-06 MED ORDER — IPRATROPIUM BROMIDE 0.02 % IN SOLN: 0.5000 mg | Freq: Four times a day (QID) | RESPIRATORY_TRACT | 12 refills | Status: DC | PRN

## 2017-05-06 MED ORDER — MENTHOL 3 MG MT LOZG: 1.0000 | LOZENGE | OROMUCOSAL | 12 refills | Status: DC | PRN

## 2017-05-06 MED ORDER — SALINE SPRAY 0.65 % NA SOLN: 1.0000 | NASAL | 0 refills | Status: AC | PRN

## 2017-05-06 MED ORDER — APIXABAN 5 MG PO TABS
5.0000 mg | ORAL_TABLET | Freq: Two times a day (BID) | ORAL | 0 refills | Status: DC
Start: 2017-05-06 — End: 2017-12-14

## 2017-05-06 MED ORDER — ALBUTEROL SULFATE (2.5 MG/3ML) 0.083% IN NEBU
10.0000 mg | INHALATION_SOLUTION | Freq: Once | RESPIRATORY_TRACT | Status: AC
  Administered 2017-05-07: 10 mg via RESPIRATORY_TRACT
  Filled 2017-05-06: qty 12

## 2017-05-06 MED ORDER — ALBUTEROL SULFATE (2.5 MG/3ML) 0.083% IN NEBU
5.0000 mg | INHALATION_SOLUTION | Freq: Once | RESPIRATORY_TRACT | Status: AC
  Administered 2017-05-06: 5 mg via RESPIRATORY_TRACT
  Filled 2017-05-06: qty 6

## 2017-05-06 NOTE — ED Triage Notes (Signed)
Pt discharged from hospital this morning.  One hour after getting home pt started getting short of breath.  On 3L O2 via [place].  Lungs diminished.  Pt learning forward and having retractions.  Unable to talk in complete sentences.

## 2017-05-06 NOTE — Progress Notes (Signed)
Progress Note  Patient Name: John Marsh Date of Encounter: 05/06/2017  Primary Cardiologist: Katrinka BlazingSmith  Subjective   Feels much better, breathing greatly improved. No chest pain, no palpitations. 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  Inpatient Medications    Scheduled Meds: . apixaban  5 mg Oral BID  . benzonatate  100 mg Oral TID  . budesonide  0.5 mg Nebulization BID  . diltiazem  360 mg Oral Daily  . doxycycline  100 mg Oral Q12H  . feeding supplement (ENSURE ENLIVE)  237 mL Oral TID BM  . gabapentin  100 mg Oral BID  . guaiFENesin  600 mg Oral BID  . ipratropium  0.5 mg Nebulization TID  . levalbuterol  1.25 mg Nebulization TID  . montelukast  4 mg Oral QHS  . nicotine  21 mg Transdermal Daily  . pantoprazole  40 mg Oral Daily  . pramipexole  0.125 mg Oral QHS  . predniSONE  50 mg Oral Q breakfast  . rosuvastatin  20 mg Oral QHS   Continuous Infusions:  PRN Meds: acetaminophen, albuterol, albuterol, chlorpheniramine-HYDROcodone, fluticasone, hydrALAZINE, menthol-cetylpyridinium, MUSCLE RUB, nitroGLYCERIN, ondansetron (ZOFRAN) IV, sodium chloride, zolpidem   Vital Signs    Vitals:   05/05/17 2049 05/06/17 0004 05/06/17 0457 05/06/17 0532  BP: (!) 138/55 (!) 132/58 (!) 115/50   Pulse: 87 78 81   Resp: 18 20 20    Temp: 97.8 F (36.6 C) 98.4 F (36.9 C) 98.2 F (36.8 C)   TempSrc: Oral Oral Oral   SpO2: 100% 100% 100%   Weight:    179 lb 3.2 oz (81.3 kg)  Height:        Intake/Output Summary (Last 24 hours) at 05/06/2017 0834 Last data filed at 05/06/2017 0600 Gross per 24 hour  Intake 1215 ml  Output 250 ml  Net 965 ml   Filed Weights   05/01/17 0144 05/05/17 1609 05/06/17 0532  Weight: 172 lb (78 kg) 181 lb 3.5 oz (82.2 kg) 179 lb 3.2 oz (81.3 kg)    Telemetry    Atrial flutter with 4:1 AV block - Personally Reviewed  ECG    Atrial flutter with 2:1and variable AV block - Personally Reviewed  Physical Exam     GEN: No acute distress.   Neck: No JVD Cardiac: RRR, no murmurs, rubs, or gallops.  Respiratory: Clear to auscultation bilaterally, but diminished breath sounds throughout. GI: Soft, nontender, non-distended  MS: No edema; No deformity. Neuro:  Nonfocal  Psych: Normal affect   Labs    Chemistry Recent Labs  Lab 05/04/17 0232 05/05/17 0243 05/06/17 0511  NA 136 136 139  K 3.7 3.8 3.6  CL 98* 99* 100*  CO2 27 28 32  GLUCOSE 259* 290* 290*  BUN 28* 27* 31*  CREATININE 1.11 1.01 0.98  CALCIUM 8.5* 8.4* 8.5*  GFRNONAA >60 >60 >60  GFRAA >60 >60 >60  ANIONGAP 11 9 7      Hematology Recent Labs  Lab 05/04/17 0232 05/05/17 0243 05/06/17 0511  WBC 12.3* 9.8 10.5  RBC 3.94* 3.83* 3.85*  HGB 8.2* 7.8* 8.0*  HCT 28.7* 27.8* 27.9*  MCV 72.8* 72.6* 72.5*  MCH 20.8* 20.4* 20.8*  MCHC 28.6* 28.1* 28.7*  RDW 16.8* 17.0* 17.1*  PLT 230 212 210    Cardiac Enzymes Recent Labs  Lab 05/01/17 1822 05/02/17 1850 05/03/17 0044 05/03/17 0410  TROPONINI 0.03* 0.14* 0.12* 0.14*    Recent Labs  Lab 05/01/17 0214  TROPIPOC  0.02     BNP Recent Labs  Lab 05/02/17 1850  BNP 481.7*     DDimer  Recent Labs  Lab 05/01/17 1146  DDIMER 0.33     Radiology    No results found.  Cardiac Studies   I have reviewed all his ECGs and telemetry tracings. There is a loy of motion artifact at times, but I believe he has always been in typical counterclockwise right atrial flutter, with varying degrees of AV block    Patient Profile     71 y.o. male with severe COPD and acute exacerbation compliacted by atrial flutter with RVR  Assessment & Plan    1. AFlutter:  Well rate controlled in 70-80', on anticoagulants. Continue diltiazem 360 mg po daily. If he remains in AFlutter, there is an option for referral for RF ablation or outpatient cardioversion.  He could be discharged from cardiac standpoint, we will arrange for an outpatient follow up in our clinic.  2. COPD:  resolving exacerbation and sepsis. Arrhythmia may abate with improving function. 3. CAD: remote problems with CAD, asymptomatic for years (previous patient of Bruce Brodie's). Normal LVEF. On highly effective statin. 4. Fe deficiency anemia: may limit long term use of anticoagulation, which would make RF ablation for atrial flutter an appealing option. 5. Anticoagulation: stop ASA and clopidogrel. FOBT negative.  For questions or updates, please contact [facility] Please consult [email or web address] for contact info under Cardiology/STEMI.      Signed, Tobias AlexanderKatarina Lynsey Ange, MD  05/06/2017, 8:34 AM

## 2017-05-06 NOTE — ED Provider Notes (Addendum)
[facility] EMERGENCY DEPARTMENT Provider Note   CSN: [id number] Arrival date & time: 05/06/17  2154     History   Chief Complaint No chief complaint on file.   HPI John Marsh is a 71 y.o. male.  The history is provided by the patient and a relative.  Shortness of Breath  This is a recurrent problem. The problem occurs continuously.The current episode started 6 to 12 hours ago. The problem has not changed since onset.Associated symptoms include wheezing. Pertinent negatives include no fever, no chest pain, no syncope and no leg swelling. It is unknown what precipitated the problem. He has tried nothing for the symptoms. The treatment provided no relief. He has had prior hospitalizations. He has had prior ED visits. Associated medical issues include COPD and CAD.    Past Medical History:  Diagnosis Date  . COPD (chronic obstructive pulmonary disease) (HCC)   . Coronary heart disease   . Emphysema of lung (HCC)   . Hyperlipidemia   . Hypertension   . MI (myocardial infarction) (HCC)   . Multiple lung nodules on CT   . Nocturnal hypoxia   . Peripheral arterial disease (HCC)   . Shortness of breath dyspnea     Patient Active Problem List   Diagnosis Date Noted  . Typical atrial flutter (HCC)   . Respiratory failure (HCC) 05/02/2017  . Lobar pneumonia (HCC) 05/01/2017  . Microcytic anemia 05/01/2017  . Protein-calorie malnutrition, severe 09/21/2016  . Elevated troponin 09/19/2016  . Acute on chronic respiratory failure with hypoxia (HCC) 09/19/2016  . Chest pain 06/30/2016  . PVD (peripheral vascular disease) with claudication (HCC) 07/27/2015  . COPD with acute exacerbation (HCC) 07/03/2015  . Tachycardia 07/03/2015  . Chronic respiratory failure with hypoxia (HCC) 07/03/2015  . Dyspnea 06/03/2015  . Allergic rhinitis 06/03/2015  . Emphysema lung (HCC) 03/04/2015  . Nocturnal hypoxia 03/04/2015  . Pulmonary nodules 08/10/2014  .  HYPERTRIGLYCERIDEMIA 08/16/2006  . HLD (hyperlipidemia) 08/16/2006  . TOBACCO DEPENDENCE 08/16/2006  . Essential hypertension 08/16/2006  . CAD (coronary artery disease), native coronary artery 08/16/2006  . COPD (chronic obstructive pulmonary disease) with emphysema (HCC) 08/16/2006    Past Surgical History:  Procedure Laterality Date  . Abdominal Aortogram N/A 07/27/2015   Performed by Chuck Hint, MD at [facility] INVASIVE CV LAB  . CHOLECYSTECTOMY    . Lower Extremity Angiography Bilateral 07/27/2015   Performed by Chuck Hint, MD at [facility] INVASIVE CV LAB  . NASAL SEPTUM SURGERY    . stent placed         Home Medications    Prior to Admission medications   Medication Sig Start Date End Date Taking? Authorizing Provider  albuterol (PROVENTIL HFA;VENTOLIN HFA) 108 (90 Base) MCG/ACT inhaler Inhale 2 puffs into the lungs every 6 (six) hours as needed for wheezing or shortness of breath. 02/05/17   Roslynn Amble, MD  apixaban (ELIQUIS) 5 MG TABS tablet Take 1 tablet (5 mg total) 2 (two) times daily by mouth. 05/06/17   Rolly Salter, MD  benzonatate (TESSALON) 100 MG capsule Take 1 capsule (100 mg total) 3 (three) times daily by mouth. 05/06/17   Rolly Salter, MD  budesonide (PULMICORT) 0.5 MG/2ML nebulizer solution Take 2 mLs (0.5 mg total) by nebulization 2 (two) times daily. 03/07/17   Roslynn Amble, MD  clopidogrel (PLAVIX) 75 MG tablet Take 75 mg by mouth daily.    [provider]  diltiazem (CARDIZEM CD) 360 MG  24 hr capsule Take 1 capsule (360 mg total) daily by mouth. 05/07/17   Rolly Salter, MD  doxycycline (VIBRA-TABS) 100 MG tablet Take 1 tablet (100 mg total) every 12 (twelve) hours for 2 days by mouth. 05/06/17 05/08/17  Rolly Salter, MD  feeding supplement, ENSURE ENLIVE, (ENSURE ENLIVE) LIQD Take 237 mLs by mouth 3 (three) times daily between meals. 09/26/16   Narda Bonds, MD  fluticasone (FLONASE) 50 MCG/ACT nasal spray Place 2  sprays into both nostrils daily as needed for allergies or rhinitis. 09/26/16   Narda Bonds, MD  formoterol (PERFOROMIST) 20 MCG/2ML nebulizer solution Take 2 mLs (20 mcg total) by nebulization 2 (two) times daily. 02/13/17   Roslynn Amble, MD  gabapentin (NEURONTIN) 100 MG capsule Take 1 capsule (100 mg total) 2 (two) times daily by mouth. 05/06/17   Rolly Salter, MD  guaiFENesin (MUCINEX) 600 MG 12 hr tablet Take 1 tablet (600 mg total) by mouth 2 (two) times daily. 09/26/16   Narda Bonds, MD  ipratropium (ATROVENT) 0.02 % nebulizer solution Take 2.5 mLs (0.5 mg total) every 6 (six) hours as needed by nebulization for wheezing or shortness of breath. 05/06/17   Rolly Salter, MD  levalbuterol Pauline Aus) 1.25 MG/0.5ML nebulizer solution Take 1.25 mg every 6 (six) hours as needed by nebulization for wheezing or shortness of breath. 05/06/17   Rolly Salter, MD  menthol-cetylpyridinium (CEPACOL) 3 MG lozenge Take 1 lozenge (3 mg total) as needed by mouth for sore throat. 05/06/17   Rolly Salter, MD  montelukast (SINGULAIR) 5 MG chewable tablet CHEW 1 TABLET BY MOUTH AT BEDTIME 02/01/16   Roslynn Amble, MD  nicotine (NICODERM CQ - DOSED IN MG/24 HOURS) 21 mg/24hr patch Place 1 patch (21 mg total) onto the skin daily. Patient taking differently: Place 21 mg daily as needed onto the skin (smoking cesation).  09/27/16   Narda Bonds, MD  nitroGLYCERIN (NITROSTAT) 0.4 MG SL tablet Place 0.4 mg under the tongue every 5 (five) minutes as needed for chest pain. x3 doses as needed for chest pain    [provider]  pantoprazole (PROTONIX) 40 MG tablet Take 1 tablet (40 mg total) by mouth daily. 07/01/16   Erick Blinks, MD  pramipexole (MIRAPEX) 0.125 MG tablet Take 1 tablet by mouth at bedtime. 06/21/16   [provider]  predniSONE (DELTASONE) 10 MG tablet Take 50mg  daily for 3days,Take 40mg  daily for 3days,Take 30mg  daily for 3days,Take 20mg  daily for 3days,Take 10mg   daily for 3days, then stop. 05/07/17   Rolly Salter, MD  rosuvastatin (CRESTOR) 20 MG tablet Take 20 mg by mouth at bedtime.    [provider]  sodium chloride (OCEAN) 0.65 % SOLN nasal spray Place 1 spray as needed into both nostrils for congestion (nose irritation). 05/06/17   Rolly Salter, MD  Spacer/Aero-Holding Chambers (AEROCHAMBER Z-STAT PLUS) inhaler Use as instructed 03/04/15   Roslynn Amble, MD  tiotropium [facility]) 18 MCG inhalation capsule Place 18 mcg into inhaler and inhale daily.    [provider]  triamcinolone (NASACORT ALLERGY 24HR) 55 MCG/ACT AERO nasal inhaler Place 2 sprays daily as needed into the nose (allergies).     [provider]    Family History Family History  Problem Relation Age of Onset  . Heart disease Mother        died at 24  . Heart disease Father   . Heart disease Brother   .  Heart disease Sister   . Lung disease Neg Hx     Social History Social History   Tobacco Use  . Smoking status: Current Some Day Smoker    Packs/day: 0.50    Years: 55.00    Pack years: 27.50    Types: Cigarettes    Start date: 08/01/1959  . Smokeless tobacco: Never Used  . Tobacco comment: 1/2 ppd 06/03/15 - peak 3ppd   taking Chantix currently 07-20-2015  Substance Use Topics  . Alcohol use: No    Alcohol/week: 0.0 oz  . Drug use: No     Allergies   Patient has no known allergies.   Review of Systems Review of Systems  Constitutional: Negative for fever.  Respiratory: Positive for shortness of breath and wheezing.   Cardiovascular: Negative for chest pain, leg swelling and syncope.  All other systems reviewed and are negative.    Physical Exam Updated Vital Signs BP (!) 164/68   Pulse (!) 108   Temp 98.1 F (36.7 C) (Oral)   Resp (!) 31   SpO2 100%   Physical Exam  Constitutional: He is oriented to person, place, and time. He appears well-developed and well-nourished. He appears distressed.  HENT:  Head:  Normocephalic and atraumatic.  Mouth/Throat: No oropharyngeal exudate.  Eyes: Conjunctivae are normal. Pupils are equal, round, and reactive to light.  Neck: Normal range of motion. Neck supple.  Cardiovascular: Normal rate, regular rhythm, normal heart sounds and intact distal pulses.  Pulmonary/Chest: No stridor. He is in respiratory distress. He has wheezes.  Abdominal: Soft. Bowel sounds are normal. He exhibits no mass. There is no tenderness. There is no rebound and no guarding.  Musculoskeletal: Normal range of motion.  Lymphadenopathy:    He has no cervical adenopathy.  Neurological: He is alert and oriented to person, place, and time.  Skin: Skin is warm and dry. Capillary refill takes less than 2 seconds.  Psychiatric: He has a normal mood and affect.     ED Treatments / Results  Labs (all labs ordered are listed, but only abnormal results are displayed)  Results for orders placed or performed during the hospital encounter of 05/06/17  Basic metabolic panel  Result Value Ref Range   Sodium 138 135 - 145 mmol/L   Potassium 3.9 3.5 - 5.1 mmol/L   Chloride 100 (L) 101 - 111 mmol/L   CO2 29 22 - 32 mmol/L   Glucose, Bld 471 (H) 65 - 99 mg/dL   BUN 30 (H) 6 - 20 mg/dL   Creatinine, Ser 1.61 0.61 - 1.24 mg/dL   Calcium 9.0 8.9 - 09.6 mg/dL   GFR calc non Af Amer 58 (L) >60 mL/min   GFR calc Af Amer >60 >60 mL/min   Anion gap 9 5 - 15  CBC  Result Value Ref Range   WBC 15.7 (H) 4.0 - 10.5 K/uL   RBC 4.30 4.22 - 5.81 MIL/uL   Hemoglobin 9.0 (L) 13.0 - 17.0 g/dL   HCT 04.5 (L) 40.9 - 81.1 %   MCV 73.3 (L) 78.0 - 100.0 fL   MCH 20.9 (L) 26.0 - 34.0 pg   MCHC 28.6 (L) 30.0 - 36.0 g/dL   RDW 91.4 (H) 78.2 - 95.6 %   Platelets 260 150 - 400 K/uL  I-Stat Troponin, ED (not at [facility])  Result Value Ref Range   Troponin i, poc 0.06 0.00 - 0.08 ng/mL   Comment 3  Ct Angio Chest Pe W Or Wo Contrast  Result Date: 05/01/2017 CLINICAL DATA:  Evaluate for acute pulmonary  embolus. Shortness of breath. EXAM: CT ANGIOGRAPHY CHEST WITH CONTRAST TECHNIQUE: Multidetector CT imaging of the chest was performed using the standard protocol during bolus administration of intravenous contrast. Multiplanar CT image reconstructions and MIPs were obtained to evaluate the vascular anatomy. CONTRAST:  100mL ISOVUE-370 IOPAMIDOL (ISOVUE-370) INJECTION 76% COMPARISON:  02/01/2017 FINDINGS: Cardiovascular: Satisfactory opacification of the pulmonary arteries to the segmental level. No evidence of pulmonary embolism. Normal heart size. No pericardial effusion. Aortic atherosclerosis. LAD coronary artery calcifications noted. Mediastinum/Nodes: The trachea is patent and midline. Normal appearance of the esophagus. No mediastinal or hilar adenopathy. Lungs/Pleura: No pleural effusion. Calcified granuloma identified in the left upper lobe. Moderate to advanced changes of centrilobular and paraseptal emphysema. No airspace consolidation. Scarring and architectural distortion within the right upper lobe is again noted compatible with resolving inflammation/infection. Pulmonary nodule within the superior segment of the left lower lobe measures 4 mm, image 72 of series 6. Decreased from 7 mm previously. Right lower lobe pulmonary nodule is identified measuring 7 mm, image 85 of series 6. New from comparison exam. Upper Abdomen: No acute abnormality. Musculoskeletal: There is degenerative disc disease noted within the thoracic spine. No aggressive lytic or sclerotic bone lesions. Review of the MIP images confirms the above findings. IMPRESSION: 1. No evidence for acute pulmonary embolus. 2. Aortic Atherosclerosis (ICD10-I70.0) and Emphysema (ICD10-J43.9). Lad coronary artery calcifications noted. 3. Right lower lobe pulmonary nodule is new from comparison exam measuring 7 mm. Non-contrast chest CT at 6-12 months is recommended. If the nodule is stable at time of repeat CT, then future CT at 18-24 months (from  today's scan) is considered optional for low-risk patients, but is recommended for high-risk patients. This recommendation follows the consensus statement: Guidelines for Management of Incidental Pulmonary Nodules Detected on CT Images: From the Fleischner Society 2017; Radiology 2017; 284:228-243. Electronically Signed   By: Signa Kellaylor  Stroud M.D.   On: 05/01/2017 17:40   Ct Abdomen Pelvis W Contrast  Result Date: 05/03/2017 CLINICAL DATA:  Anemia.  Evaluate for retroperitoneal hemorrhage. EXAM: CT ABDOMEN AND PELVIS WITH CONTRAST TECHNIQUE: Multidetector CT imaging of the abdomen and pelvis was performed using the standard protocol following bolus administration of intravenous contrast. CONTRAST:  100mL ISOVUE-300 IOPAMIDOL (ISOVUE-300) INJECTION 61% COMPARISON:  CT angiogram 03/02/2004. FINDINGS: Lower chest:  Emphysema.  Calcified granulomata noted bilaterally. Hepatobiliary: No focal abnormality within the liver parenchyma. Gallbladder surgically absent. No intrahepatic or extrahepatic biliary dilation. Pancreas: No focal mass lesion. No dilatation of the main duct. No intraparenchymal cyst. No peripancreatic edema. Spleen: No splenomegaly. No focal mass lesion. Adrenals/Urinary Tract: No adrenal nodule or mass. Kidneys are unremarkable. No evidence for hydroureter. The urinary bladder appears normal for the degree of distention. Stomach/Bowel: Stomach is nondistended. No gastric wall thickening. No evidence of outlet obstruction. Duodenum is normally positioned as is the ligament of Treitz. No small bowel wall thickening. No small bowel dilatation. The terminal ileum is normal. The appendix is normal. Prominent stool volume. Vascular/Lymphatic: There is abdominal aortic atherosclerosis without aneurysm. There is no gastrohepatic or hepatoduodenal ligament lymphadenopathy. No intraperitoneal or retroperitoneal lymphadenopathy. No pelvic sidewall lymphadenopathy. Reproductive: The prostate gland and seminal  vesicles have normal imaging features. Other: No intraperitoneal free fluid. Musculoskeletal: Bone windows reveal no worrisome lytic or sclerotic osseous lesions. No evidence for retroperitoneal hemorrhage/ hematoma. IMPRESSION: 1. No CT findings to explain the patient's history of anemia. Specifically,  no retroperitoneal hemorrhage or hematoma. 2.  Aortic Atherosclerois (ICD10-170.0) 3.  Emphysema. (ZOX09-U04.9) Electronically Signed   By: Kennith Center M.D.   On: 05/03/2017 15:12   Dg Chest Port 1 View  Result Date: 05/02/2017 CLINICAL DATA:  Shortness of Breath EXAM: PORTABLE CHEST 1 VIEW COMPARISON:  Chest CT May 01, 2017 and chest radiograph May 01, 2017 FINDINGS: There is underlying emphysematous change with bullous disease in the upper lobes. There is a small calcified granuloma in the left upper lobe. There is no edema or consolidation. Heart is mildly enlarged. The pulmonary vascularity reflects the underlying emphysematous change with decreased vascularity to the upper lobes. No adenopathy. No bone lesions. IMPRESSION: Underlying emphysematous change. No edema or consolidation. Stable cardiac silhouette with heart mildly enlarged given degree of underlying emphysema. Small calcified granuloma left upper lobe. Emphysema (ICD10-J43.9). Electronically Signed   By: Bretta Bang III M.D.   On: 05/02/2017 14:40   Dg Chest Portable 1 View  Result Date: 05/01/2017 CLINICAL DATA:  Acute onset of worsening shortness of breath. EXAM: PORTABLE CHEST 1 VIEW COMPARISON:  Chest radiograph performed 09/21/2016, and CT of the chest performed 02/01/2017 FINDINGS: The lungs are hyperexpanded, with flattening of the hemidiaphragms compatible with COPD. Scarring is noted at the right lung apex. Mild right basilar airspace opacity could reflect mild pneumonia. There is no evidence of pleural effusion or pneumothorax. The cardiomediastinal silhouette is normal in size. No acute osseous abnormalities are  identified. IMPRESSION: 1. Mild right basilar airspace opacity could reflect mild pneumonia. 2. Findings of COPD, with scarring at the right lung apex. Electronically Signed   By: Roanna Raider M.D.   On: 05/01/2017 02:21    EKG  EKG Interpretation  Date/Time:  Sunday May 06 2017 22:02:20 EST Ventricular Rate:  112 PR Interval:    QRS Duration: 76 QT Interval:  322 QTC Calculation: 439 R Axis:   68 Text Interpretation:  tachycardia with motion artifact Confirmed by Nicanor Alcon, Genesi Stefanko ([id number]) on 05/06/2017 11:27:12 PM       Radiology No results found.  Procedures Procedures (including critical care time)  Medications Ordered in ED Medications  albuterol (PROVENTIL) (2.5 MG/3ML) 0.083% nebulizer solution 10 mg (not administered)  ipratropium (ATROVENT) nebulizer solution 0.5 mg (not administered)  methylPREDNISolone sodium succinate (SOLU-MEDROL) 125 mg/2 mL injection 125 mg (not administered)  albuterol (PROVENTIL) (2.5 MG/3ML) 0.083% nebulizer solution 5 mg (5 mg Nebulization Given 05/06/17 2207)  albuterol (PROVENTIL,VENTOLIN) solution continuous neb (10 mg/hr Nebulization Given 05/06/17 2223)    MDM Interpretation: labs, ECG and x-ray (elevated sugar and white count No PNA by me on CXR) Total time providing critical care: 30-74 minutes. This excludes time spent performing separately reportable procedures and services. Consults: admitting MD   CRITICAL CARE Performed by: Jasmine Awe Total critical care time: 31 minutes Critical care time was exclusive of separately billable procedures and treating other patients. Critical care was necessary to treat or prevent imminent or life-threatening deterioration. Critical care was time spent personally by me on the following activities: development of treatment plan with patient and/or surrogate as well as nursing, discussions with consultants, evaluation of patient's response to treatment, examination of patient,  obtaining history from patient or surrogate, ordering and performing treatments and interventions, ordering and review of laboratory studies, ordering and review of radiographic studies, pulse oximetry and re-evaluation of patient's condition. Final Clinical Impressions(s) / ED Diagnoses   COPD exacerbation:  Increased O2 requirement and wheezing that is intractable.  Speaking in short phrases.  Steel Kerney, MD 05/07/17 [id number]    Cy BlamerPalumbo, Beatrice Ziehm, MD 05/07/17 [id number]

## 2017-05-06 NOTE — Care Management Note (Signed)
Case Management Note  Patient Details  Name: Harvie HeckHugh T Micale MRN: [id number] Date of Birth: [birth year]  Subjective/Objective:                 Spoke to patient at the bedside. Discussed Eliquis 30 day card and he and wife verbalized understanding for use. Dr Allena KatzPatel requested assistance to ensure cardiac rehab is started after DC. Patient information emailed to Driscilla Moatsaphne Wood to follow up w Monday to ensure that Cardiac Rehab is initiated. CM signing off   Action/Plan:   Expected Discharge Date:  05/06/17               Expected Discharge Plan:  Home/Self Care  In-House Referral:     Discharge planning Services  CM Consult, Medication Assistance  Post Acute Care Choice:    Choice offered to:     DME Arranged:    DME Agency:     HH Arranged:    HH Agency:     Status of Service:  Completed, signed off  If discussed at [employer] Length of Stay Meetings, dates discussed:    Additional Comments:  Lawerance SabalDebbie Madolin Twaddle, RN 05/06/2017, 11:23 AM

## 2017-05-06 NOTE — Progress Notes (Signed)
Patient removed telemetry twice and thought he was going home. RN gave Remus Lofflerambien for sleep, Remus Lofflerambien may have made him confused when he awoke.

## 2017-05-07 ENCOUNTER — Other Ambulatory Visit: Payer: Self-pay

## 2017-05-07 ENCOUNTER — Encounter (HOSPITAL_COMMUNITY): Payer: Self-pay | Admitting: Emergency Medicine

## 2017-05-07 DIAGNOSIS — I700 Atherosclerosis of aorta: Secondary | ICD-10-CM | POA: Diagnosis present

## 2017-05-07 DIAGNOSIS — J9621 Acute and chronic respiratory failure with hypoxia: Secondary | ICD-10-CM | POA: Diagnosis present

## 2017-05-07 DIAGNOSIS — R000 Tachycardia, unspecified: Secondary | ICD-10-CM

## 2017-05-07 DIAGNOSIS — Z7951 Long term (current) use of inhaled steroids: Secondary | ICD-10-CM | POA: Diagnosis not present

## 2017-05-07 DIAGNOSIS — E1165 Type 2 diabetes mellitus with hyperglycemia: Secondary | ICD-10-CM | POA: Diagnosis present

## 2017-05-07 DIAGNOSIS — E1151 Type 2 diabetes mellitus with diabetic peripheral angiopathy without gangrene: Secondary | ICD-10-CM | POA: Diagnosis present

## 2017-05-07 DIAGNOSIS — J309 Allergic rhinitis, unspecified: Secondary | ICD-10-CM | POA: Diagnosis not present

## 2017-05-07 DIAGNOSIS — I4892 Unspecified atrial flutter: Secondary | ICD-10-CM | POA: Diagnosis present

## 2017-05-07 DIAGNOSIS — J471 Bronchiectasis with (acute) exacerbation: Secondary | ICD-10-CM | POA: Diagnosis present

## 2017-05-07 DIAGNOSIS — I110 Hypertensive heart disease with heart failure: Secondary | ICD-10-CM | POA: Diagnosis present

## 2017-05-07 DIAGNOSIS — J441 Chronic obstructive pulmonary disease with (acute) exacerbation: Secondary | ICD-10-CM | POA: Diagnosis present

## 2017-05-07 DIAGNOSIS — D72829 Elevated white blood cell count, unspecified: Secondary | ICD-10-CM

## 2017-05-07 DIAGNOSIS — D509 Iron deficiency anemia, unspecified: Secondary | ICD-10-CM | POA: Diagnosis present

## 2017-05-07 DIAGNOSIS — I2510 Atherosclerotic heart disease of native coronary artery without angina pectoris: Secondary | ICD-10-CM | POA: Diagnosis present

## 2017-05-07 DIAGNOSIS — I5032 Chronic diastolic (congestive) heart failure: Secondary | ICD-10-CM | POA: Diagnosis present

## 2017-05-07 DIAGNOSIS — Z8249 Family history of ischemic heart disease and other diseases of the circulatory system: Secondary | ICD-10-CM | POA: Diagnosis not present

## 2017-05-07 DIAGNOSIS — K219 Gastro-esophageal reflux disease without esophagitis: Secondary | ICD-10-CM | POA: Diagnosis present

## 2017-05-07 DIAGNOSIS — G8929 Other chronic pain: Secondary | ICD-10-CM | POA: Diagnosis present

## 2017-05-07 DIAGNOSIS — F17210 Nicotine dependence, cigarettes, uncomplicated: Secondary | ICD-10-CM | POA: Diagnosis present

## 2017-05-07 DIAGNOSIS — J470 Bronchiectasis with acute lower respiratory infection: Secondary | ICD-10-CM | POA: Diagnosis present

## 2017-05-07 DIAGNOSIS — Z9049 Acquired absence of other specified parts of digestive tract: Secondary | ICD-10-CM | POA: Diagnosis not present

## 2017-05-07 DIAGNOSIS — Z9981 Dependence on supplemental oxygen: Secondary | ICD-10-CM | POA: Diagnosis not present

## 2017-05-07 DIAGNOSIS — M549 Dorsalgia, unspecified: Secondary | ICD-10-CM | POA: Diagnosis present

## 2017-05-07 DIAGNOSIS — Z7901 Long term (current) use of anticoagulants: Secondary | ICD-10-CM | POA: Diagnosis not present

## 2017-05-07 DIAGNOSIS — I252 Old myocardial infarction: Secondary | ICD-10-CM | POA: Diagnosis not present

## 2017-05-07 DIAGNOSIS — J439 Emphysema, unspecified: Secondary | ICD-10-CM | POA: Diagnosis present

## 2017-05-07 DIAGNOSIS — J449 Chronic obstructive pulmonary disease, unspecified: Secondary | ICD-10-CM | POA: Diagnosis present

## 2017-05-07 DIAGNOSIS — E785 Hyperlipidemia, unspecified: Secondary | ICD-10-CM | POA: Diagnosis present

## 2017-05-07 HISTORY — DX: Chronic obstructive pulmonary disease with (acute) exacerbation: J44.1

## 2017-05-07 LAB — COMPREHENSIVE METABOLIC PANEL
ALBUMIN: 4.1 g/dL (ref 3.5–5.0)
ALT: 101 U/L — ABNORMAL HIGH (ref 17–63)
ANION GAP: 13 (ref 5–15)
AST: 63 U/L — ABNORMAL HIGH (ref 15–41)
Alkaline Phosphatase: 54 U/L (ref 38–126)
BUN: 30 mg/dL — ABNORMAL HIGH (ref 6–20)
CO2: 24 mmol/L (ref 22–32)
Calcium: 8.6 mg/dL — ABNORMAL LOW (ref 8.9–10.3)
Chloride: 100 mmol/L — ABNORMAL LOW (ref 101–111)
Creatinine, Ser: 1.25 mg/dL — ABNORMAL HIGH (ref 0.61–1.24)
GFR calc Af Amer: >60 mL/min (ref >60)
GFR calc non Af Amer: 56 mL/min — ABNORMAL LOW (ref >60)
GLUCOSE: 374 mg/dL — AB (ref 65–99)
Potassium: 3.7 mmol/L (ref 3.5–5.1)
Sodium: 137 mmol/L (ref 135–145)
Total Bilirubin: 0.5 mg/dL (ref 0.3–1.2)
Total Protein: 6.8 g/dL (ref 6.5–8.1)

## 2017-05-07 LAB — CBC
HCT: 32.8 % — ABNORMAL LOW (ref 39.0–52.0)
HEMOGLOBIN: 9.3 g/dL — AB (ref 13.0–17.0)
MCH: 20.9 pg — ABNORMAL LOW (ref 26.0–34.0)
MCHC: 28.4 g/dL — AB (ref 30.0–36.0)
MCV: 73.5 fL — ABNORMAL LOW (ref 78.0–100.0)
Platelets: 269 10*3/uL (ref 150–400)
RBC: 4.46 MIL/uL (ref 4.22–5.81)
RDW: 17.2 % — ABNORMAL HIGH (ref 11.5–15.5)
WBC: 18.2 10*3/uL — ABNORMAL HIGH (ref 4.0–10.5)

## 2017-05-07 LAB — GLUCOSE, CAPILLARY: Glucose-Capillary: 358 mg/dL — ABNORMAL HIGH (ref 65–99)

## 2017-05-07 LAB — TROPONIN I
TROPONIN I: 0.06 ng/mL — AB (ref <0.03)
Troponin I: 0.07 ng/mL (ref <0.03)
Troponin I: 0.06 ng/mL (ref <0.03)

## 2017-05-07 LAB — CBG MONITORING, ED
Glucose-Capillary: 360 mg/dL — ABNORMAL HIGH (ref 65–99)
Glucose-Capillary: 390 mg/dL — ABNORMAL HIGH (ref 65–99)
Glucose-Capillary: 299 mg/dL — ABNORMAL HIGH (ref 65–99)
Glucose-Capillary: 271 mg/dL — ABNORMAL HIGH (ref 65–99)

## 2017-05-07 MED ORDER — APIXABAN 5 MG PO TABS
5.0000 mg | ORAL_TABLET | Freq: Two times a day (BID) | ORAL | Status: DC
  Administered 2017-05-07 – 2017-05-09 (×5): 5 mg via ORAL
  Filled 2017-05-07 (×6): qty 1

## 2017-05-07 MED ORDER — INSULIN ASPART 100 UNIT/ML ~~LOC~~ SOLN
0.0000 [IU] | Freq: Three times a day (TID) | SUBCUTANEOUS | Status: DC
  Administered 2017-05-07 (×2): 5 [IU] via SUBCUTANEOUS
  Administered 2017-05-07: 9 [IU] via SUBCUTANEOUS
  Administered 2017-05-08 (×2): 5 [IU] via SUBCUTANEOUS
  Filled 2017-05-07 (×3): qty 1

## 2017-05-07 MED ORDER — ALBUTEROL SULFATE (2.5 MG/3ML) 0.083% IN NEBU
2.5000 mg | INHALATION_SOLUTION | Freq: Four times a day (QID) | RESPIRATORY_TRACT | Status: DC
  Administered 2017-05-07 (×2): 2.5 mg via RESPIRATORY_TRACT
  Filled 2017-05-07 (×2): qty 3

## 2017-05-07 MED ORDER — NICOTINE 21 MG/24HR TD PT24: 21.0000 mg | MEDICATED_PATCH | Freq: Every day | TRANSDERMAL | Status: DC | PRN

## 2017-05-07 MED ORDER — DILTIAZEM HCL ER COATED BEADS 180 MG PO CP24
360.0000 mg | ORAL_CAPSULE | Freq: Every day | ORAL | Status: DC
  Administered 2017-05-07 – 2017-05-09 (×3): 360 mg via ORAL
  Filled 2017-05-07: qty 2
  Filled 2017-05-07: qty 1
  Filled 2017-05-07: qty 2

## 2017-05-07 MED ORDER — PRAMIPEXOLE DIHYDROCHLORIDE 0.125 MG PO TABS
0.1250 mg | ORAL_TABLET | Freq: Every day | ORAL | Status: DC
  Administered 2017-05-07 – 2017-05-08 (×2): 0.125 mg via ORAL
  Filled 2017-05-07 (×4): qty 1

## 2017-05-07 MED ORDER — ENSURE ENLIVE PO LIQD
237.0000 mL | Freq: Three times a day (TID) | ORAL | Status: DC
  Administered 2017-05-07 – 2017-05-09 (×4): 237 mL via ORAL
  Filled 2017-05-07: qty 237

## 2017-05-07 MED ORDER — METHYLPREDNISOLONE SODIUM SUCC 125 MG IJ SOLR
80.0000 mg | Freq: Three times a day (TID) | INTRAMUSCULAR | Status: DC
Start: 2017-05-07 — End: 2017-05-07
  Administered 2017-05-07: 80 mg via INTRAVENOUS
  Filled 2017-05-07: qty 2

## 2017-05-07 MED ORDER — ALBUTEROL SULFATE (2.5 MG/3ML) 0.083% IN NEBU: 2.5000 mg | INHALATION_SOLUTION | Freq: Four times a day (QID) | RESPIRATORY_TRACT | Status: DC | PRN

## 2017-05-07 MED ORDER — SACCHAROMYCES BOULARDII 250 MG PO CAPS
250.0000 mg | ORAL_CAPSULE | Freq: Two times a day (BID) | ORAL | Status: DC
  Administered 2017-05-07 – 2017-05-09 (×5): 250 mg via ORAL
  Filled 2017-05-07 (×6): qty 1

## 2017-05-07 MED ORDER — PANTOPRAZOLE SODIUM 40 MG PO TBEC
40.0000 mg | DELAYED_RELEASE_TABLET | Freq: Every day | ORAL | Status: DC
  Administered 2017-05-07 – 2017-05-09 (×3): 40 mg via ORAL
  Filled 2017-05-07 (×3): qty 1

## 2017-05-07 MED ORDER — SODIUM CHLORIDE 0.9% FLUSH: 3.0000 mL | INTRAVENOUS | Status: DC | PRN

## 2017-05-07 MED ORDER — SODIUM CHLORIDE 0.9 % IV SOLN: 250.0000 mL | INTRAVENOUS | Status: DC | PRN

## 2017-05-07 MED ORDER — BENZONATATE 100 MG PO CAPS
100.0000 mg | ORAL_CAPSULE | Freq: Three times a day (TID) | ORAL | Status: DC
  Administered 2017-05-07 – 2017-05-09 (×8): 100 mg via ORAL
  Filled 2017-05-07 (×8): qty 1

## 2017-05-07 MED ORDER — ALBUTEROL SULFATE (2.5 MG/3ML) 0.083% IN NEBU: 2.5000 mg | INHALATION_SOLUTION | RESPIRATORY_TRACT | Status: DC | PRN

## 2017-05-07 MED ORDER — ALBUTEROL SULFATE (2.5 MG/3ML) 0.083% IN NEBU
2.5000 mg | INHALATION_SOLUTION | Freq: Four times a day (QID) | RESPIRATORY_TRACT | Status: DC
  Administered 2017-05-07 – 2017-05-08 (×6): 2.5 mg via RESPIRATORY_TRACT
  Filled 2017-05-07 (×6): qty 3

## 2017-05-07 MED ORDER — CLOPIDOGREL BISULFATE 75 MG PO TABS
75.0000 mg | ORAL_TABLET | Freq: Every day | ORAL | Status: DC
  Administered 2017-05-07 – 2017-05-09 (×3): 75 mg via ORAL
  Filled 2017-05-07 (×3): qty 1

## 2017-05-07 MED ORDER — MENTHOL 3 MG MT LOZG: 1.0000 | LOZENGE | OROMUCOSAL | Status: DC | PRN

## 2017-05-07 MED ORDER — GABAPENTIN 100 MG PO CAPS
100.0000 mg | ORAL_CAPSULE | Freq: Two times a day (BID) | ORAL | Status: DC
  Administered 2017-05-07 – 2017-05-09 (×5): 100 mg via ORAL
  Filled 2017-05-07 (×5): qty 1

## 2017-05-07 MED ORDER — ARFORMOTEROL TARTRATE 15 MCG/2ML IN NEBU
15.0000 ug | INHALATION_SOLUTION | Freq: Two times a day (BID) | RESPIRATORY_TRACT | Status: DC
  Administered 2017-05-07 – 2017-05-09 (×5): 15 ug via RESPIRATORY_TRACT
  Filled 2017-05-07 (×6): qty 2

## 2017-05-07 MED ORDER — METHYLPREDNISOLONE SODIUM SUCC 125 MG IJ SOLR
60.0000 mg | Freq: Two times a day (BID) | INTRAMUSCULAR | Status: DC
  Administered 2017-05-07 – 2017-05-09 (×5): 60 mg via INTRAVENOUS
  Filled 2017-05-07 (×5): qty 2

## 2017-05-07 MED ORDER — GUAIFENESIN ER 600 MG PO TB12
600.0000 mg | ORAL_TABLET | Freq: Two times a day (BID) | ORAL | Status: DC
  Administered 2017-05-07 – 2017-05-09 (×5): 600 mg via ORAL
  Filled 2017-05-07 (×5): qty 1

## 2017-05-07 MED ORDER — LEVOFLOXACIN IN D5W 500 MG/100ML IV SOLN
500.0000 mg | INTRAVENOUS | Status: DC
  Administered 2017-05-07 – 2017-05-09 (×3): 500 mg via INTRAVENOUS
  Filled 2017-05-07 (×5): qty 100

## 2017-05-07 MED ORDER — IPRATROPIUM BROMIDE 0.02 % IN SOLN
0.5000 mg | Freq: Four times a day (QID) | RESPIRATORY_TRACT | Status: DC
  Administered 2017-05-07 – 2017-05-08 (×6): 0.5 mg via RESPIRATORY_TRACT
  Filled 2017-05-07 (×6): qty 2.5

## 2017-05-07 MED ORDER — ACETAMINOPHEN 325 MG PO TABS
650.0000 mg | ORAL_TABLET | Freq: Four times a day (QID) | ORAL | Status: DC | PRN
  Administered 2017-05-07: 650 mg via ORAL
  Filled 2017-05-07: qty 2

## 2017-05-07 MED ORDER — INSULIN ASPART 100 UNIT/ML ~~LOC~~ SOLN
0.0000 [IU] | Freq: Every day | SUBCUTANEOUS | Status: DC
  Administered 2017-05-08: 5 [IU] via SUBCUTANEOUS

## 2017-05-07 MED ORDER — FLUTICASONE PROPIONATE 50 MCG/ACT NA SUSP: 2.0000 | Freq: Every day | NASAL | Status: DC | PRN

## 2017-05-07 MED ORDER — BUDESONIDE 0.5 MG/2ML IN SUSP
0.5000 mg | Freq: Two times a day (BID) | RESPIRATORY_TRACT | Status: DC
  Administered 2017-05-07 – 2017-05-09 (×5): 0.5 mg via RESPIRATORY_TRACT
  Filled 2017-05-07 (×6): qty 2

## 2017-05-07 MED ORDER — SALINE SPRAY 0.65 % NA SOLN: 1.0000 | NASAL | Status: DC | PRN

## 2017-05-07 MED ORDER — ROSUVASTATIN CALCIUM 20 MG PO TABS
20.0000 mg | ORAL_TABLET | Freq: Every day | ORAL | Status: DC
  Administered 2017-05-07 – 2017-05-08 (×2): 20 mg via ORAL
  Filled 2017-05-07 (×3): qty 1

## 2017-05-07 MED ORDER — MONTELUKAST SODIUM 5 MG PO CHEW
5.0000 mg | CHEWABLE_TABLET | Freq: Every day | ORAL | Status: DC
  Administered 2017-05-07 – 2017-05-08 (×2): 5 mg via ORAL
  Filled 2017-05-07 (×4): qty 1

## 2017-05-07 MED ORDER — ACETAMINOPHEN 650 MG RE SUPP: 650.0000 mg | Freq: Four times a day (QID) | RECTAL | Status: DC | PRN

## 2017-05-07 MED ORDER — SODIUM CHLORIDE 0.9% FLUSH: 3.0000 mL | Freq: Two times a day (BID) | INTRAVENOUS | Status: DC

## 2017-05-07 NOTE — Progress Notes (Addendum)
John Marsh [id number] Admission Data: 05/07/2017 6:40 PM Attending Provider: Lonia BloodMcClung, John T, MD  GEX:[id number]:Badger, Kayleen MemosMichael C, MD Consults/ Treatment Team:   John Marsh is a 71 y.o. male patient admitted from ED awake, alert  & orientated  X 3,  Full Code, VSS - Blood pressure 135/61, pulse 90, temperature 98.1 F (36.7 Marsh), temperature source Oral, resp. rate 18, height 6' (1.829 m), weight 82.1 kg (181 lb), SpO2 100 %., O2    3 L nasal cannular, no Marsh/o shortness of breath, no Marsh/o chest pain, no distress noted. Tele # 5N1 placed and pt is currently running:Fib/flutter    IV site WDL:  forearm right, condition patent and no redness with a transparent dsg that's clean dry and intact.  Allergies:  No Known Allergies   Past Medical History:  Diagnosis Date  . COPD (chronic obstructive pulmonary disease) (HCC)   . Coronary heart disease   . Emphysema of lung (HCC)   . Hyperlipidemia   . Hypertension   . MI (myocardial infarction) (HCC)   . Multiple lung nodules on CT   . Nocturnal hypoxia   . Peripheral arterial disease (HCC)   . Shortness of breath dyspnea     History:  obtained from the patient. Tobacco/alcohol: Former smoker Pt orientation to unit, room and routine. Information packet given to patient/family.  Admission INP armband ID verified with patient/family, and in place. SR up x 2, fall risk assessment complete with Patient and family verbalizing understanding of risks associated with falls. Pt verbalizes an understanding of how to use the call bell and to call for help before getting out of bed.  Skin, clean-dry- intact without evidence of bruising, or skin tears.   No evidence of skin break down noted on exam. no rashes, no ecchymoses    Will cont to monitor and assist as needed.  John Gutzmer Consuella Loselaine, RN 05/07/2017 6:40 PM

## 2017-05-07 NOTE — ED Notes (Signed)
Pt received breakfast tray, did not eat any food. Requested coffee only.

## 2017-05-07 NOTE — ED Notes (Signed)
Patient CBG was 299.

## 2017-05-07 NOTE — Discharge Summary (Addendum)
[facility] Discharge Summary   Patient: John Marsh ZOX:[id number]   PCP: Eartha Inch, MD DOB: 1946-05-17   Date of admission: 05/01/2017   Date of discharge: 05/06/2017    Discharge Diagnoses:  Principal Problem:   Acute on chronic respiratory failure with hypoxia (HCC) Active Problems:   HLD (hyperlipidemia)   Essential hypertension   CAD (coronary artery disease), native coronary artery   COPD with acute exacerbation (HCC)   Protein-calorie malnutrition, severe   Lobar pneumonia (HCC)   Anemia   Respiratory failure (HCC)   Typical atrial flutter (HCC)   Admitted From: home Disposition:  home  Recommendations for Outpatient Follow-up:  1. Follow-up with PCP in 1 week. 2. Follow-up with gastroenterology as recommended.  Follow-up with cardiology as recommended  Follow-up Information    Brahmbhatt, Parag, MD. Schedule an appointment as soon as possible for a visit in 2 month(s).   Specialty:  Gastroenterology Contact information: [address] [place] [address] [phone]        Lyn Records, MD Follow up.   Specialty:  Cardiology Why:  our office will call you with a follow-up appointment  Contact information: [address] [place] [address] [phone]        Eartha Inch, MD. Schedule an appointment as soon as possible for a visit in 1 week(s).   Specialty:  Family Medicine Contact information: [address] [place] [address] [phone]          Diet recommendation: Cardiac diet  Activity: The patient is advised to gradually reintroduce usual activities.  Discharge Condition: good  Code Status: full code  History of present illness: As per the H and P dictated on admission, "John Marsh is a 71 y.o. male with medical history significant of COPD, chronic respiratory failure on 3 L oxygen at home, hypertension, hyperlipidemia, GERD, PVD, CAD, tobacco abuse, who presents with  shortness of breath.  Patient states that he has been having shortness breath in the past several days, which has been progressively getting worse. He has cough with little white mucus production. Patient can speak in full sentence, but needs more oxygen than baseline. Denies fever or chills. No runny nose or sore throat. Patient states that he had mildly chest pain earlier, which has resolved. Currently no chest pain. Denies nausea, vomiting, diarrhea, abdominal pain, symptoms of UTI or unilateral weakness. Denies hematuria, hematochezia, hematemesis.  ED Course: pt was found to have WBC 17.7, hemoglobin 8.3 which was 13.5 on 09/26/16, negative FOBT, electrolytes renal function okay, temperature normal, tachycardia, tachypnea, negative troponin. Chest x-ray showed infiltration in right base.Patient is admitted to telemetry bed as inpatient.  "  Hospital Course:  Summary of his active problems in the hospital is as following. 1.  Acute hypoxic respiratory failure on chronic. COPD exacerbation. Acute bronchitis causing sepsis now resolved. CT chest PE protocol is negative for any pulmonary embolism, shows possible right pulmonary nodule. Urine streptococcal pneumonia and Legionella all negative Flu PCR, respiratory virus panel all negative Blood culture x2, no growth so far Repeat chest x-ray shows no infiltrates, just emphysematous changes Continue Atrovent and Xopenex nebs Continue home Pulmicort nebulizer, Spiriva inhaler and Singulair Changing IV Solu-Medrol to oral prednisone. Mucinex for cough Patient was on IV Rocephin and azithromycin, changing to oral doxycycline Continue oxygen Will provide new prescriptions for ipratropium as well as Xopenex nebulizers as needed.  Patient will continue his formoterol nebulizer at home.  2.  New onset A. fib. Chads score is 3. Started on IV heparin initially and currently on oral Eliquis. Also started on oral Cardizem.  Due to persistent  tachycardia dose was increased. Currently on daily dose Cardizem.   Cardiology recommends considering atrial flutter ablation, most likely as an outpatient once stable.   3.  Elevated troponin. Demand ischemia.  Troponins peaked to 0.14.  No intervention required for now.  4.  Iron deficiency anemia. Patient was on high-dose ibuprofen at home, he did have a significant drop in hemoglobin from 13-8. FOBT negative, no active bleeding reported. CT abdomen pelvis without contrast negative for any acute intra-abdominal bleeding. Hemoglobin remained stable despite being on heparin as well as currently on Eliquis. At this point GI was consulted and recommend no further inpatient workup. Outpatient follow-up.  5.  Chronic back pain. Patient complains about worsening of his chronic back pain. Had initial evaluation with vascular surgery back in January 2018. Symptoms appears to be positive for a neurological pain, probable sciatica. We will start the patient on gabapentin and recommend outpatient follow-up with neurology as recommended by vascular surgery earlier. May also benefit from physical therapy on discharge. The pain totally resolved and patient would follow-up with PCP for now.  6.  Essential hypertension. Blood pressure stable. On Cardizem.  Monitor.  All other chronic medical condition were stable during the hospitalization.  Patient was seen by physical therapy, who recommended cardiac rehab, which was arranged by Child psychotherapist and case Production designer, theatre/television/film. On the day of the discharge the patient's vitals were stable, and no other acute medical condition were reported by patient. the patient was felt safe to be discharge at home with family.  Procedures and Results:  Echocardiogram    Consultations:  Gastroenterology  Cardiology  DISCHARGE MEDICATION: Discharge Medication List as of 05/06/2017 12:36 PM    START taking these medications   Details  apixaban (ELIQUIS) 5 MG TABS  tablet Take 1 tablet (5 mg total) 2 (two) times daily by mouth., Starting Sun 05/06/2017, Normal    benzonatate (TESSALON) 100 MG capsule Take 1 capsule (100 mg total) 3 (three) times daily by mouth., Starting Sun 05/06/2017, Normal    diltiazem (CARDIZEM CD) 360 MG 24 hr capsule Take 1 capsule (360 mg total) daily by mouth., Starting Mon 05/07/2017, Normal    doxycycline (VIBRA-TABS) 100 MG tablet Take 1 tablet (100 mg total) every 12 (twelve) hours for 2 days by mouth., Starting Sun 05/06/2017, Until Tue 05/08/2017, Normal    gabapentin (NEURONTIN) 100 MG capsule Take 1 capsule (100 mg total) 2 (two) times daily by mouth., Starting Sun 05/06/2017, Normal    ipratropium (ATROVENT) 0.02 % nebulizer solution Take 2.5 mLs (0.5 mg total) every 6 (six) hours as needed by nebulization for wheezing or shortness of breath., Starting Sun 05/06/2017, Normal    levalbuterol (XOPENEX) 1.25 MG/0.5ML nebulizer solution Take 1.25 mg every 6 (six) hours as needed by nebulization for wheezing or shortness of breath., Starting Sun 05/06/2017, Normal    menthol-cetylpyridinium (CEPACOL) 3 MG lozenge Take 1 lozenge (3 mg total) as needed by mouth for sore throat., Starting Sun 05/06/2017, Normal    predniSONE (DELTASONE) 10 MG tablet Take 50mg  daily for 3days,Take 40mg  daily for 3days,Take 30mg  daily for 3days,Take 20mg  daily for 3days,Take 10mg  daily for 3days, then stop., Print    sodium chloride (OCEAN) 0.65 % SOLN nasal spray Place 1 spray as needed into both nostrils for congestion (nose irritation)., Starting Sun 05/06/2017, Normal  CONTINUE these medications which have NOT CHANGED   Details  albuterol (PROVENTIL HFA;VENTOLIN HFA) 108 (90 Base) MCG/ACT inhaler Inhale 2 puffs into the lungs every 6 (six) hours as needed for wheezing or shortness of breath., Starting Mon 02/05/2017, Normal    budesonide (PULMICORT) 0.5 MG/2ML nebulizer solution Take 2 mLs (0.5 mg total) by nebulization 2 (two) times  daily., Starting Wed 03/07/2017, Normal    clopidogrel (PLAVIX) 75 MG tablet Take 75 mg by mouth daily., Until Discontinued, Historical Med    feeding supplement, ENSURE ENLIVE, (ENSURE ENLIVE) LIQD Take 237 mLs by mouth 3 (three) times daily between meals., Starting Tue 09/26/2016, Normal    fluticasone (FLONASE) 50 MCG/ACT nasal spray Place 2 sprays into both nostrils daily as needed for allergies or rhinitis., Starting Tue 09/26/2016, Normal    formoterol (PERFOROMIST) 20 MCG/2ML nebulizer solution Take 2 mLs (20 mcg total) by nebulization 2 (two) times daily., Starting Tue 02/13/2017, Normal    guaiFENesin (MUCINEX) 600 MG 12 hr tablet Take 1 tablet (600 mg total) by mouth 2 (two) times daily., Starting Tue 09/26/2016, Print    montelukast (SINGULAIR) 5 MG chewable tablet CHEW 1 TABLET BY MOUTH AT BEDTIME, Normal    nicotine (NICODERM CQ - DOSED IN MG/24 HOURS) 21 mg/24hr patch Place 1 patch (21 mg total) onto the skin daily., Starting Wed 09/27/2016, Normal    pantoprazole (PROTONIX) 40 MG tablet Take 1 tablet (40 mg total) by mouth daily., Starting Sat 07/01/2016, Print    pramipexole (MIRAPEX) 0.125 MG tablet Take 1 tablet by mouth at bedtime., Starting Wed 06/21/2016, Historical Med    rosuvastatin (CRESTOR) 20 MG tablet Take 20 mg by mouth at bedtime., Until Discontinued, Historical Med    Spacer/Aero-Holding Chambers (AEROCHAMBER Z-STAT PLUS) inhaler Use as instructed, Print    triamcinolone (NASACORT ALLERGY 24HR) 55 MCG/ACT AERO nasal inhaler Place 2 sprays daily as needed into the nose (allergies). , Historical Med    nitroGLYCERIN (NITROSTAT) 0.4 MG SL tablet Place 0.4 mg under the tongue every 5 (five) minutes as needed for chest pain. x3 doses as needed for chest pain, Until Discontinued, Historical Med    tiotropium (SPIRIVA) 18 MCG inhalation capsule Place 18 mcg into inhaler and inhale daily., Until Discontinued, Historical Med      STOP taking these medications      albuterol (PROVENTIL) (2.5 MG/3ML) 0.083% nebulizer solution      aspirin EC 81 MG tablet      ibuprofen (ADVIL,MOTRIN) 600 MG tablet        No Known Allergies Discharge Instructions    AMB Referral to Phase II Cardiac Rehab   Complete by:  As directed    Diagnosis:  Other   Diet - low sodium heart healthy   Complete by:  As directed    Discharge instructions   Complete by:  As directed    It is important that you read following instructions as well as go over your medication list with RN to help you understand your care after this hospitalization.  Discharge Instructions: Please follow-up with PCP in one week  Please request your primary care physician to go over all Hospital Tests and Procedure/Radiological results at the follow up,  Please get all Hospital records sent to your PCP by signing hospital release before you go home.   You were cared for by a hospitalist during your hospital stay. If you have any questions about your discharge medications or the care you received while you were in the  hospital after you are discharged, you can call the unit and ask to speak with the hospitalist on call if the hospitalist that took care of you is not available.  Once you are discharged, your primary care physician will handle any further medical issues. Please note that NO REFILLS for any discharge medications will be authorized once you are discharged, as it is imperative that you return to your primary care physician (or establish a relationship with a primary care physician if you do not have one) for your aftercare needs so that they can reassess your need for medications and monitor your lab values. You Must read complete instructions/literature along with all the possible adverse reactions/side effects for all the Medicines you take and that have been prescribed to you. Take any new Medicines after you have completely understood and accept all the possible adverse reactions/side  effects. Wear Seat belts while driving. If you have smoked or chewed Tobacco in the last 2 yrs please stop smoking and/or stop any Recreational drug use.   Increase activity slowly   Complete by:  As directed      Discharge Exam: Filed Weights   05/01/17 0144 05/05/17 1609 05/06/17 0532  Weight: 78 kg (172 lb) 82.2 kg (181 lb 3.5 oz) 81.3 kg (179 lb 3.2 oz)   Vitals:   05/06/17 0949 05/06/17 1143  BP: 130/62 (!) 118/58  Pulse: 86 88  Resp:  20  Temp:  98.9 F (37.2 C)  SpO2:  100%   General: Appear in no distress, no Rash; Oral Mucosa moist Cardiovascular: S1 and S2 Present, no Murmur, no JVD Respiratory: Bilateral Air entry present and Occasional Crackles, no wheezes Abdomen: Bowel Sound present, Soft and no tenderness Extremities: no Pedal edema, no calf tenderness Neurology: Grossly no focal neuro deficit.  The results of significant diagnostics from this hospitalization (including imaging, microbiology, ancillary and laboratory) are listed below for reference.    Significant Diagnostic Studies: Dg Chest 2 View  Result Date: 05/06/2017 CLINICAL DATA:  Shortness of breath after discharge from hospital this morning. EXAM: CHEST  2 VIEW COMPARISON:  05/02/2017 FINDINGS: Heart size and pulmonary vascularity are normal. Emphysematous changes in the lungs with scattered fibrosis. Scattered calcified granulomas bilaterally. No developing consolidation or airspace disease. No blunting of costophrenic angles. No pneumothorax. Mediastinal contours appear intact. Calcification of the aorta. IMPRESSION: Emphysematous changes and scattered fibrosis in the lungs. No evidence of active pulmonary disease. Aortic atherosclerosis. Electronically Signed   By: Burman Nieves M.D.   On: 05/06/2017 23:59   Ct Angio Chest Pe W Or Wo Contrast  Result Date: 05/01/2017 CLINICAL DATA:  Evaluate for acute pulmonary embolus. Shortness of breath. EXAM: CT ANGIOGRAPHY CHEST WITH CONTRAST TECHNIQUE:  Multidetector CT imaging of the chest was performed using the standard protocol during bolus administration of intravenous contrast. Multiplanar CT image reconstructions and MIPs were obtained to evaluate the vascular anatomy. CONTRAST:  ISOVUE-370 IOPAMIDOL (ISOVUE-370) INJECTION 76% COMPARISON:  02/01/2017 FINDINGS: Cardiovascular: Satisfactory opacification of the pulmonary arteries to the segmental level. No evidence of pulmonary embolism. Normal heart size. No pericardial effusion. Aortic atherosclerosis. LAD coronary artery calcifications noted. Mediastinum/Nodes: The trachea is patent and midline. Normal appearance of the esophagus. No mediastinal or hilar adenopathy. Lungs/Pleura: No pleural effusion. Calcified granuloma identified in the left upper lobe. Moderate to advanced changes of centrilobular and paraseptal emphysema. No airspace consolidation. Scarring and architectural distortion within the right upper lobe is again noted compatible with resolving inflammation/infection. Pulmonary nodule within the  superior segment of the left lower lobe measures 4 mm, image 72 of series 6. Decreased from 7 mm previously. Right lower lobe pulmonary nodule is identified measuring 7 mm, image 85 of series 6. New from comparison exam. Upper Abdomen: No acute abnormality. Musculoskeletal: There is degenerative disc disease noted within the thoracic spine. No aggressive lytic or sclerotic bone lesions. Review of the MIP images confirms the above findings. IMPRESSION: 1. No evidence for acute pulmonary embolus. 2. Aortic Atherosclerosis (ICD10-I70.0) and Emphysema (ICD10-J43.9). Lad coronary artery calcifications noted. 3. Right lower lobe pulmonary nodule is new from comparison exam measuring 7 mm. Non-contrast chest CT at 6-12 months is recommended. If the nodule is stable at time of repeat CT, then future CT at 18-24 months (from today's scan) is considered optional for low-risk patients, but is recommended for  high-risk patients. This recommendation follows the consensus statement: Guidelines for Management of Incidental Pulmonary Nodules Detected on CT Images: From the Fleischner Society 2017; Radiology 2017; 284:228-243. Electronically Signed   By: Signa Kell M.D.   On: 05/01/2017 17:40   Ct Abdomen Pelvis W Contrast  Result Date: 05/03/2017 CLINICAL DATA:  Anemia.  Evaluate for retroperitoneal hemorrhage. EXAM: CT ABDOMEN AND PELVIS WITH CONTRAST TECHNIQUE: Multidetector CT imaging of the abdomen and pelvis was performed using the standard protocol following bolus administration of intravenous contrast. CONTRAST:  ISOVUE-300 IOPAMIDOL (ISOVUE-300) INJECTION 61% COMPARISON:  CT angiogram 03/02/2004. FINDINGS: Lower chest:  Emphysema.  Calcified granulomata noted bilaterally. Hepatobiliary: No focal abnormality within the liver parenchyma. Gallbladder surgically absent. No intrahepatic or extrahepatic biliary dilation. Pancreas: No focal mass lesion. No dilatation of the main duct. No intraparenchymal cyst. No peripancreatic edema. Spleen: No splenomegaly. No focal mass lesion. Adrenals/Urinary Tract: No adrenal nodule or mass. Kidneys are unremarkable. No evidence for hydroureter. The urinary bladder appears normal for the degree of distention. Stomach/Bowel: Stomach is nondistended. No gastric wall thickening. No evidence of outlet obstruction. Duodenum is normally positioned as is the ligament of Treitz. No small bowel wall thickening. No small bowel dilatation. The terminal ileum is normal. The appendix is normal. Prominent stool volume. Vascular/Lymphatic: There is abdominal aortic atherosclerosis without aneurysm. There is no gastrohepatic or hepatoduodenal ligament lymphadenopathy. No intraperitoneal or retroperitoneal lymphadenopathy. No pelvic sidewall lymphadenopathy. Reproductive: The prostate gland and seminal vesicles have normal imaging features. Other: No intraperitoneal free fluid.  Musculoskeletal: Bone windows reveal no worrisome lytic or sclerotic osseous lesions. No evidence for retroperitoneal hemorrhage/ hematoma. IMPRESSION: 1. No CT findings to explain the patient's history of anemia. Specifically, no retroperitoneal hemorrhage or hematoma. 2.  Aortic Atherosclerois (ICD10-170.0) 3.  Emphysema. (ZOX09-U04.9) Electronically Signed   By: Kennith Center M.D.   On: 05/03/2017 15:12   Dg Chest Port 1 View  Result Date: 05/02/2017 CLINICAL DATA:  Shortness of Breath EXAM: PORTABLE CHEST 1 VIEW COMPARISON:  Chest CT May 01, 2017 and chest radiograph May 01, 2017 FINDINGS: There is underlying emphysematous change with bullous disease in the upper lobes. There is a small calcified granuloma in the left upper lobe. There is no edema or consolidation. Heart is mildly enlarged. The pulmonary vascularity reflects the underlying emphysematous change with decreased vascularity to the upper lobes. No adenopathy. No bone lesions. IMPRESSION: Underlying emphysematous change. No edema or consolidation. Stable cardiac silhouette with heart mildly enlarged given degree of underlying emphysema. Small calcified granuloma left upper lobe. Emphysema (ICD10-J43.9). Electronically Signed   By: Bretta Bang III M.D.   On: 05/02/2017 14:40   Dg Chest  Portable 1 View  Result Date: 05/01/2017 CLINICAL DATA:  Acute onset of worsening shortness of breath. EXAM: PORTABLE CHEST 1 VIEW COMPARISON:  Chest radiograph performed 09/21/2016, and CT of the chest performed 02/01/2017 FINDINGS: The lungs are hyperexpanded, with flattening of the hemidiaphragms compatible with COPD. Scarring is noted at the right lung apex. Mild right basilar airspace opacity could reflect mild pneumonia. There is no evidence of pleural effusion or pneumothorax. The cardiomediastinal silhouette is normal in size. No acute osseous abnormalities are identified. IMPRESSION: 1. Mild right basilar airspace opacity could reflect  mild pneumonia. 2. Findings of COPD, with scarring at the right lung apex. Electronically Signed   By: Roanna Raider M.D.   On: 05/01/2017 02:21    Microbiology: Recent Results (from the past 240 hour(s))  Culture, blood (x 2)     Status: None   Collection Time: 05/01/17  5:45 AM  Result Value Ref Range Status   Specimen Description BLOOD RIGHT ARM  Final   Special Requests   Final    BOTTLES DRAWN AEROBIC AND ANAEROBIC Blood Culture results may not be optimal due to an excessive volume of blood received in culture bottles PATIENT ON FOLLOWING ZITHROMAX 500MG  ROCEPHIN 1GM   Culture NO GROWTH 5 DAYS  Final   Report Status 05/06/2017 FINAL  Final  Culture, blood (x 2)     Status: None   Collection Time: 05/01/17  6:00 AM  Result Value Ref Range Status   Specimen Description BLOOD RIGHT HAND  Final   Special Requests   Final    IN PEDIATRIC BOTTLE Blood Culture results may not be optimal due to an excessive volume of blood received in culture bottles PATIENT ON FOLLOWING ZITHROMAX 500MG  ROCEPHIN 1GM   Culture NO GROWTH 5 DAYS  Final   Report Status 05/06/2017 FINAL  Final  Respiratory Panel by PCR     Status: None   Collection Time: 05/01/17 12:08 PM  Result Value Ref Range Status   Adenovirus NOT DETECTED NOT DETECTED Final   Coronavirus 229E NOT DETECTED NOT DETECTED Final   Coronavirus HKU1 NOT DETECTED NOT DETECTED Final   Coronavirus NL63 NOT DETECTED NOT DETECTED Final   Coronavirus OC43 NOT DETECTED NOT DETECTED Final   Metapneumovirus NOT DETECTED NOT DETECTED Final   Rhinovirus / Enterovirus NOT DETECTED NOT DETECTED Final   Influenza A NOT DETECTED NOT DETECTED Final   Influenza B NOT DETECTED NOT DETECTED Final   Parainfluenza Virus 1 NOT DETECTED NOT DETECTED Final   Parainfluenza Virus 2 NOT DETECTED NOT DETECTED Final   Parainfluenza Virus 3 NOT DETECTED NOT DETECTED Final   Parainfluenza Virus 4 NOT DETECTED NOT DETECTED Final   Respiratory Syncytial Virus NOT  DETECTED NOT DETECTED Final   Bordetella pertussis NOT DETECTED NOT DETECTED Final   Chlamydophila pneumoniae NOT DETECTED NOT DETECTED Final   Mycoplasma pneumoniae NOT DETECTED NOT DETECTED Final     Labs: CBC: Lab 05/01/17 0145 05/02/17 0649  05/03/17 0410 05/04/17 0232 05/05/17 0243 05/06/17 0511  WBC 7.7 13.1*  --  13.9* 12.3* 9.8 10.5  NEUTROABS 5.7 11.9*  --  12.9* 11.2*  --   --   HGB 8.2* 7.1*   < > 8.3* 8.2* 7.8* 8.0*  HCT 29.2* 25.6*   < > 28.7* 28.7* 27.8* 27.9*  MCV 73.4* 74.0*  --  73.4* 72.8* 72.6* 72.5*  PLT 232 223  --  231 230 212 210   Basic Metabolic Panel: Lab 05/04/17 0232 05/05/17 0243 05/06/17  0511  NA 136 136 139  K 3.7 3.8 3.6  CL 98* 99* 100*  CO2 27 28 32  GLUCOSE 259* 290* 290*  BUN 28* 27* 31*  CREATININE 1.11 1.01 0.98  CALCIUM 8.5* 8.4* 8.5*  MG  --  2.3  --    Cardiac Enzymes: Lab 05/01/17 1822 05/02/17 1850 05/03/17 0044 05/03/17 0410  TROPONINI 0.03* 0.14* 0.12* 0.14*   BNP (last 3 results) Recent Labs    05/02/17 1850  BNP 481.7*    Time spent: 35 minutes  Signed:  Lynden OxfordPranav Trajon Rosete  [facility] 05/06/2017  , 10:31 AM

## 2017-05-07 NOTE — Procedures (Signed)
Orders received for Bipap.  Patient is requesting we hold off on the Bipap for now, feeling better after the neb treatments started.  RN aware.

## 2017-05-07 NOTE — ED Notes (Signed)
Patient was given a cup of ice water. 

## 2017-05-07 NOTE — ED Notes (Signed)
Patient CBG was 271. 

## 2017-05-07 NOTE — Progress Notes (Signed)
[facility] TEAM 1 - Stepdown/ICU TEAM  John Marsh  ZHY:[id number] DOB: 1945-06-21 DOA: 05/06/2017 PCP: Eartha InchBadger, Michael C, MD    Brief Narrative:  71 y.o. male w/ a hx ofCOPD on 3 L oxygen at home, hypertension, hyperlipidemia, GERD, PVD, CAD, and tobacco abuse who was recently admitted for a COPD exacerbation was at home for <24 hours before returning to the ED with shortness of breath and a slight dry cough.   Significant Events: 11/19 admit   Subjective: Pt seen for a f/u visit in the ED.  He is feeling better at this time.    Assessment & Plan:  Acute on chronic hypoxic respiratory failure on chronic - acute COPD exacerbation.  New onset A. Fib/flutter  Chads score is 3 - cont Eliquis - Cardiology previously recommended considering atrial flutter ablation  Iron deficiency anemia. FOBT negative during prior admit - CT abdomen pelvis without contrast negative - GI recommend no further inpatient workup  Chronic back pain  Essential hypertension  DVT prophylaxis: eliquis Code Status: FULL CODE Family Communication: no family present at time of exam  Disposition Plan: tele bed - possible d/c 24hrs   Consultants:  none  Antimicrobials:  none  Objective: Blood pressure (!) 145/72, pulse (!) 110, temperature 98.1 F (36.7 C), temperature source Oral, resp. rate (!) 33, SpO2 100 %.  Intake/Output Summary (Last 24 hours) at 05/07/2017 1034 Last data filed at 05/07/2017 0617 Gross per 24 hour  Intake 100 ml  Output -  Net 100 ml   There were no vitals filed for this visit.  Examination: F/U exam completed   CBC: Recent Labs  Lab 05/01/17 0145 05/02/17 0649  05/03/17 0410 05/04/17 0232 05/05/17 0243 05/06/17 0511 05/06/17 2206 05/07/17 0549  WBC 7.7 13.1*  --  13.9* 12.3* 9.8 10.5 15.7* 18.2*  NEUTROABS 5.7 11.9*  --  12.9* 11.2*  --   --   --   --   HGB 8.2* 7.1*   < > 8.3* 8.2* 7.8* 8.0* 9.0* 9.3*  HCT 29.2* 25.6*   < > 28.7* 28.7* 27.8* 27.9*  31.5* 32.8*  MCV 73.4* 74.0*  --  73.4* 72.8* 72.6* 72.5* 73.3* 73.5*  PLT 232 223  --  231 230 212 210 260 269   < > = values in this interval not displayed.   Basic Metabolic Panel: Recent Labs  Lab 05/04/17 0232 05/05/17 0243 05/06/17 0511 05/06/17 2206 05/07/17 0549  NA 136 136 139 138 137  K 3.7 3.8 3.6 3.9 3.7  CL 98* 99* 100* 100* 100*  CO2 27 28 32 29 24  GLUCOSE 259* 290* 290* 471* 374*  BUN 28* 27* 31* 30* 30*  CREATININE 1.11 1.01 0.98 1.21 1.25*  CALCIUM 8.5* 8.4* 8.5* 9.0 8.6*  MG  --  2.3  --   --   --    GFR: Estimated Creatinine Clearance: 58.6 mL/min (A) (by C-G formula based on SCr of 1.25 mg/dL (H)).  Liver Function Tests: Recent Labs  Lab 05/07/17 0549  AST 63*  ALT 101*  ALKPHOS 54  BILITOT 0.5  PROT 6.8  ALBUMIN 4.1    Coagulation Profile: Recent Labs  Lab 05/01/17 0606  INR 1.02    Cardiac Enzymes: Recent Labs  Lab 05/01/17 1822 05/02/17 1850 05/03/17 0044 05/03/17 0410 05/07/17 0549  TROPONINI 0.03* 0.14* 0.12* 0.14* 0.07*    HbA1C: No results found for: HGBA1C  CBG: Recent Labs  Lab 05/07/17 0900  GLUCAP 360*  Recent Results (from the past 240 hour(s))  Culture, blood (x 2)     Status: None   Collection Time: 05/01/17  5:45 AM  Result Value Ref Range Status   Specimen Description BLOOD RIGHT ARM  Final   Special Requests   Final    BOTTLES DRAWN AEROBIC AND ANAEROBIC Blood Culture results may not be optimal due to an excessive volume of blood received in culture bottles PATIENT ON FOLLOWING ZITHROMAX 500MG  ROCEPHIN 1GM   Culture NO GROWTH 5 DAYS  Final   Report Status 05/06/2017 FINAL  Final  Culture, blood (x 2)     Status: None   Collection Time: 05/01/17  6:00 AM  Result Value Ref Range Status   Specimen Description BLOOD RIGHT HAND  Final   Special Requests   Final    IN PEDIATRIC BOTTLE Blood Culture results may not be optimal due to an excessive volume of blood received in culture bottles PATIENT ON  FOLLOWING ZITHROMAX 500MG  ROCEPHIN 1GM   Culture NO GROWTH 5 DAYS  Final   Report Status 05/06/2017 FINAL  Final  Respiratory Panel by PCR     Status: None   Collection Time: 05/01/17 12:08 PM  Result Value Ref Range Status   Adenovirus NOT DETECTED NOT DETECTED Final   Coronavirus 229E NOT DETECTED NOT DETECTED Final   Coronavirus HKU1 NOT DETECTED NOT DETECTED Final   Coronavirus NL63 NOT DETECTED NOT DETECTED Final   Coronavirus OC43 NOT DETECTED NOT DETECTED Final   Metapneumovirus NOT DETECTED NOT DETECTED Final   Rhinovirus / Enterovirus NOT DETECTED NOT DETECTED Final   Influenza A NOT DETECTED NOT DETECTED Final   Influenza B NOT DETECTED NOT DETECTED Final   Parainfluenza Virus 1 NOT DETECTED NOT DETECTED Final   Parainfluenza Virus 2 NOT DETECTED NOT DETECTED Final   Parainfluenza Virus 3 NOT DETECTED NOT DETECTED Final   Parainfluenza Virus 4 NOT DETECTED NOT DETECTED Final   Respiratory Syncytial Virus NOT DETECTED NOT DETECTED Final   Bordetella pertussis NOT DETECTED NOT DETECTED Final   Chlamydophila pneumoniae NOT DETECTED NOT DETECTED Final   Mycoplasma pneumoniae NOT DETECTED NOT DETECTED Final     Scheduled Meds: . albuterol  2.5 mg Nebulization Q6H  . apixaban  5 mg Oral BID  . arformoterol  15 mcg Nebulization BID  . benzonatate  100 mg Oral TID  . budesonide  0.5 mg Nebulization BID  . clopidogrel  75 mg Oral Daily  . diltiazem  360 mg Oral Daily  . feeding supplement (ENSURE ENLIVE)  237 mL Oral TID BM  . gabapentin  100 mg Oral BID  . guaiFENesin  600 mg Oral BID  . insulin aspart  0-5 Units Subcutaneous QHS  . insulin aspart  0-9 Units Subcutaneous TID WC  . methylPREDNISolone (SOLU-MEDROL) injection  80 mg Intravenous Q8H  . montelukast  5 mg Oral QHS  . pantoprazole  40 mg Oral Daily  . pramipexole  0.125 mg Oral QHS  . rosuvastatin  20 mg Oral QHS  . saccharomyces boulardii  250 mg Oral BID  . sodium chloride flush  3 mL Intravenous Q12H      LOS: 0 days   Lonia BloodJeffrey T. McClung, MD [facility] Office  [phone] Pager - Text Page per Loretha StaplerAmion as per below:  On-Call/Text Page:      [email or web address]      password [email or web address]  If 7PM-7AM, please contact night-coverage [email or web address] Password [email or web address] 05/07/2017, 10:34 AM

## 2017-05-07 NOTE — H&P (Addendum)
TRH H&P   Patient Demographics:    John PoliHugh Marsh, is a 71 y.o. male  MRN: [id number]   DOB - 1945/06/28  Admit Date - 05/06/2017  Outpatient Primary MD for the patient is Eartha InchBadger, Michael C, MD  Referring MD/NP/PA: April Palumbo  Outpatient Specialists:   Patient coming from: home  No chief complaint on file.  Dyspnea   HPI:    John Marsh  is a 71 y.o. male, medical history significant of COPD, chronic respiratory failure on 3 L oxygen at home, hypertension, hyperlipidemia, GERD, PVD, CAD, tobacco abuse, who was recently admitted for Copd exacerbation was apparently at home for <24 hours and  presents with shortness of breath. Pt has slight dry cough. Slight chest pain with cough, lasting a few seconds.   Denies fever, chills, palp, n/v, diarrhea, brbpr, black stool.  Pt denies smoking cigarettes at home.  Pt came to ED due to dyspnea.    In ED,  CXR IMPRESSION: Emphysematous changes and scattered fibrosis in the lungs. No evidence of active pulmonary disease. Aortic atherosclerosis.  EKG Tachycardia with motion artifact  Glucose 471,  Bun 30, Creatinine 1.21 Wbc 15.7, Hgb 9.0 Plt 260 Trop 0.06  Pt will be admitted for Copd exacerbation and hyperglycemia      Review of systems:    In addition to the HPI above,  No Fever-chills, No Headache, No changes with Vision or hearing, No problems swallowing food or Liquids, No Chest pain, No Abdominal pain, No Nausea or Vommitting + diarrhea  No Blood in stool or Urine, No dysuria, No new skin rashes or bruises, No new joints pains-aches,  No new weakness, tingling, numbness in any extremity, No recent weight gain or loss, No polyuria, polydypsia or polyphagia, No significant Mental Stressors.  A full 10 point Review of Systems was done, except as stated above, all other Review of Systems were negative.   With  Past History of the following :    Past Medical History:  Diagnosis Date  . COPD (chronic obstructive pulmonary disease) (HCC)   . Coronary heart disease   . Emphysema of lung (HCC)   . Hyperlipidemia   . Hypertension   . MI (myocardial infarction) (HCC)   . Multiple lung nodules on CT   . Nocturnal hypoxia   . Peripheral arterial disease (HCC)   . Shortness of breath dyspnea       Past Surgical History:  Procedure Laterality Date  . Abdominal Aortogram N/A 07/27/2015   [facility]  . CHOLECYSTECTOMY    . Lower Extremity Angiography Bilateral 07/27/2015   [facility]  . NASAL SEPTUM SURGERY    . stent placed        Social History:     Social History   Tobacco Use  .  Smoking status: Current Some Day Smoker    Packs/day: 0.50    Years: 55.00    Pack years: 27.50    Types: Cigarettes    Start date: 08/01/1959  . Smokeless tobacco: Never Used  . Tobacco comment: 1/2 ppd 06/03/15 - peak 3ppd   taking Chantix currently 07-20-2015  Substance Use Topics  . Alcohol use: No    Alcohol/week: 0.0 oz     Lives - at home  Mobility - walks by self    Family History :     Family History  Problem Relation Age of Onset  . Heart disease Mother        died at 65  . Heart disease Father   . Heart disease Brother   . Heart disease Sister   . Lung disease Neg Hx       Home Medications:   Prior to Admission medications   Medication Sig Start Date End Date Taking? Authorizing Provider  albuterol (PROVENTIL HFA;VENTOLIN HFA) 108 (90 Base) MCG/ACT inhaler Inhale 2 puffs into the lungs every 6 (six) hours as needed for wheezing or shortness of breath. 02/05/17  Yes Roslynn Amble, MD  apixaban (ELIQUIS) 5 MG TABS tablet Take 1 tablet (5 mg total) 2 (two) times daily by mouth. 05/06/17  Yes Rolly Salter, MD  benzonatate (TESSALON) 100 MG capsule Take 1 capsule (100 mg  total) 3 (three) times daily by mouth. 05/06/17  Yes Rolly Salter, MD  budesonide (PULMICORT) 0.5 MG/2ML nebulizer solution Take 2 mLs (0.5 mg total) by nebulization 2 (two) times daily. 03/07/17  Yes Roslynn Amble, MD  clopidogrel (PLAVIX) 75 MG tablet Take 75 mg by mouth daily.   Yes [provider]  diltiazem (CARDIZEM CD) 360 MG 24 hr capsule Take 1 capsule (360 mg total) daily by mouth. 05/07/17  Yes Rolly Salter, MD  doxycycline (VIBRA-TABS) 100 MG tablet Take 1 tablet (100 mg total) every 12 (twelve) hours for 2 days by mouth. 05/06/17 05/08/17 Yes Rolly Salter, MD  feeding supplement, ENSURE ENLIVE, (ENSURE ENLIVE) LIQD Take 237 mLs by mouth 3 (three) times daily between meals. 09/26/16  Yes Narda Bonds, MD  fluticasone (FLONASE) 50 MCG/ACT nasal spray Place 2 sprays into both nostrils daily as needed for allergies or rhinitis. 09/26/16  Yes Narda Bonds, MD  formoterol (PERFOROMIST) 20 MCG/2ML nebulizer solution Take 2 mLs (20 mcg total) by nebulization 2 (two) times daily. 02/13/17  Yes Roslynn Amble, MD  gabapentin (NEURONTIN) 100 MG capsule Take 1 capsule (100 mg total) 2 (two) times daily by mouth. 05/06/17  Yes Rolly Salter, MD  guaiFENesin (MUCINEX) 600 MG 12 hr tablet Take 1 tablet (600 mg total) by mouth 2 (two) times daily. 09/26/16  Yes Narda Bonds, MD  ipratropium (ATROVENT) 0.02 % nebulizer solution Take 2.5 mLs (0.5 mg total) every 6 (six) hours as needed by nebulization for wheezing or shortness of breath. 05/06/17  Yes Rolly Salter, MD  levalbuterol Pauline Aus) 1.25 MG/0.5ML nebulizer solution Take 1.25 mg every 6 (six) hours as needed by nebulization for wheezing or shortness of breath. 05/06/17  Yes Rolly Salter, MD  menthol-cetylpyridinium (CEPACOL) 3 MG lozenge Take 1 lozenge (3 mg total) as needed by mouth for sore throat. 05/06/17  Yes Rolly Salter, MD  montelukast (SINGULAIR) 5 MG chewable tablet CHEW 1 TABLET BY MOUTH AT BEDTIME  02/01/16  Yes Roslynn Amble, MD  nicotine (NICODERM CQ - DOSED  IN MG/24 HOURS) 21 mg/24hr patch Place 1 patch (21 mg total) onto the skin daily. Patient taking differently: Place 21 mg daily as needed onto the skin (smoking cesation).  09/27/16  Yes Narda BondsNettey, Ralph A, MD  pantoprazole (PROTONIX) 40 MG tablet Take 1 tablet (40 mg total) by mouth daily. 07/01/16  Yes Erick BlinksMemon, Jehanzeb, MD  pramipexole (MIRAPEX) 0.125 MG tablet Take 1 tablet by mouth at bedtime. 06/21/16  Yes [provider]  predniSONE (DELTASONE) 10 MG tablet Take 50mg  daily for 3days,Take 40mg  daily for 3days,Take 30mg  daily for 3days,Take 20mg  daily for 3days,Take 10mg  daily for 3days, then stop. 05/07/17  Yes Rolly SalterPatel, Pranav M, MD  rosuvastatin (CRESTOR) 20 MG tablet Take 20 mg by mouth at bedtime.   Yes [provider]  sodium chloride (OCEAN) 0.65 % SOLN nasal spray Place 1 spray as needed into both nostrils for congestion (nose irritation). 05/06/17  Yes Rolly SalterPatel, Pranav M, MD  Spacer/Aero-Holding Chambers (AEROCHAMBER Z-STAT PLUS) inhaler Use as instructed 03/04/15  Yes Roslynn AmbleNestor, Jennings E, MD  triamcinolone (NASACORT ALLERGY 24HR) 55 MCG/ACT AERO nasal inhaler Place 2 sprays daily as needed into the nose (allergies).    Yes [provider]  nitroGLYCERIN (NITROSTAT) 0.4 MG SL tablet Place 0.4 mg under the tongue every 5 (five) minutes as needed for chest pain. x3 doses as needed for chest pain    [provider]  tiotropium (SPIRIVA) 18 MCG inhalation capsule Place 18 mcg into inhaler and inhale daily.    [provider]     Allergies:    No Known Allergies   Physical Exam:   Vitals  Blood pressure (!) 141/66, pulse (!) 109, temperature 98.1 F (36.7 C), temperature source Oral, resp. rate (!) 22, SpO2 100 %.   1. General  lying in bed in NAD,  2. Normal affect and insight, Not Suicidal or Homicidal, Awake Alert, Oriented X 3.  3. No F.N deficits, ALL C.Nerves Intact, Strength  5/5 all 4 extremities, Sensation intact all 4 extremities, Plantars down going.  4. Ears and Eyes appear Normal, Conjunctivae clear, PERRLA. Moist Oral Mucosa.  5. Supple Neck, No JVD, No cervical lymphadenopathy appriciated, No Carotid Bruits.  6. Symmetrical Chest wall movement, + bilateral exp wheezing, no crackles, tight breath sounds.   7. Borderline tachy s1, s2, no m/g/r  8. Positive Bowel Sounds, Abdomen Soft, No tenderness, No organomegaly appriciated,No rebound -guarding or rigidity.  9.  No Cyanosis, Normal Skin Turgor, No Skin Rash or Bruise.  10. Good muscle tone,  joints appear normal , no effusions, Normal ROM.  11. No Palpable Lymph Nodes in Neck or Axillae     Data Review:    CBC Recent Labs  Lab 05/01/17 0145 05/02/17 0649  05/03/17 0410 05/04/17 0232 05/05/17 0243 05/06/17 0511 05/06/17 2206  WBC 7.7 13.1*  --  13.9* 12.3* 9.8 10.5 15.7*  HGB 8.2* 7.1*   < > 8.3* 8.2* 7.8* 8.0* 9.0*  HCT 29.2* 25.6*   < > 28.7* 28.7* 27.8* 27.9* 31.5*  PLT 232 223  --  231 230 212 210 260  MCV 73.4* 74.0*  --  73.4* 72.8* 72.6* 72.5* 73.3*  MCH 20.6* 20.5*  --  21.2* 20.8* 20.4* 20.8* 20.9*  MCHC 28.1* 27.7*  --  28.9* 28.6* 28.1* 28.7* 28.6*  RDW 16.6* 17.2*  --  17.0* 16.8* 17.0* 17.1* 17.4*  LYMPHSABS 1.2 0.7  --  0.6* 0.6*  --   --   --   MONOABS 0.5  0.5  --  0.4 0.5  --   --   --   EOSABS 0.2 0.0  --  0.0 0.0  --   --   --   BASOSABS 0.1 0.0  --  0.0 0.0  --   --   --    < > = values in this interval not displayed.   ------------------------------------------------------------------------------------------------------------------  Chemistries  Recent Labs  Lab 05/03/17 0410 05/04/17 0232 05/05/17 0243 05/06/17 0511 05/06/17 2206  NA 139 136 136 139 138  K 3.8 3.7 3.8 3.6 3.9  CL 100* 98* 99* 100* 100*  CO2 29 27 28  32 29  GLUCOSE 237* 259* 290* 290* 471*  BUN 22* 28* 27* 31* 30*  CREATININE 1.05 1.11 1.01 0.98 1.21  CALCIUM 8.7* 8.5* 8.4* 8.5* 9.0   MG  --   --  2.3  --   --    ------------------------------------------------------------------------------------------------------------------ estimated creatinine clearance is 60.6 mL/min (by C-G formula based on SCr of 1.21 mg/dL). ------------------------------------------------------------------------------------------------------------------ No results for input(s): TSH, T4TOTAL, T3FREE, THYROIDAB in the last 72 hours.  Invalid input(s): FREET3  Coagulation profile Recent Labs  Lab 05/01/17 0606  INR 1.02   ------------------------------------------------------------------------------------------------------------------- No results for input(s): DDIMER in the last 72 hours. -------------------------------------------------------------------------------------------------------------------  Cardiac Enzymes Recent Labs  Lab 05/02/17 1850 05/03/17 0044 05/03/17 0410  TROPONINI 0.14* 0.12* 0.14*   ------------------------------------------------------------------------------------------------------------------    Component Value Date/Time   BNP 481.7 (H) 05/02/2017 1850     ---------------------------------------------------------------------------------------------------------------  Urinalysis    Component Value Date/Time   COLORURINE YELLOW 05/02/2017 1427   APPEARANCEUR CLEAR 05/02/2017 1427   LABSPEC 1.010 05/02/2017 1427   PHURINE 6.5 05/02/2017 1427   GLUCOSEU 100 (A) 05/02/2017 1427   HGBUR MODERATE (A) 05/02/2017 1427   BILIRUBINUR NEGATIVE 05/02/2017 1427   KETONESUR NEGATIVE 05/02/2017 1427   PROTEINUR NEGATIVE 05/02/2017 1427   UROBILINOGEN 0.2 08/08/2012 1910   NITRITE NEGATIVE 05/02/2017 1427   LEUKOCYTESUR NEGATIVE 05/02/2017 1427    ----------------------------------------------------------------------------------------------------------------   Imaging Results:    Dg Chest 2 View  Result Date: 05/06/2017 CLINICAL DATA:  Shortness of  breath after discharge from hospital this morning. EXAM: CHEST  2 VIEW COMPARISON:  05/02/2017 FINDINGS: Heart size and pulmonary vascularity are normal. Emphysematous changes in the lungs with scattered fibrosis. Scattered calcified granulomas bilaterally. No developing consolidation or airspace disease. No blunting of costophrenic angles. No pneumothorax. Mediastinal contours appear intact. Calcification of the aorta. IMPRESSION: Emphysematous changes and scattered fibrosis in the lungs. No evidence of active pulmonary disease. Aortic atherosclerosis. Electronically Signed   By: Burman Nieves M.D.   On: 05/06/2017 23:59      Assessment & Plan:    Principal Problem:   COPD exacerbation (HCC) Active Problems:   Tachycardia   Anemia   Leukocytosis    Copd exacerbation solumedrol 80mg  iv q8h Levaquin 500mg  iv qday spiriva 1puff qday (use in place of atrovent) brovana pulmicort Albuterol neb q6h and q6h prn  (use this in place of levalbuterol)  Tachycardia Tele Trop I q6h x3 Pt has hx of troponin elevation on prior admission, and CT chest showed calcification in the LAD distribution Please consult cardiology if trop positive for possible inpatient/ outpatient evaluation, ie stress testing  CAD Cont cont cardizem Cont crestor Appears aspirin and plavix discontinued on prior admission   PAflutter (new onset) Cont Eqliuis  Cont Cardizem  Right pulmonary nodule/ spiculated density F/u as outpatient please w pulmonary  Diarrhea Gi pathogen panel C. Diff florastor  DVT  Prophylaxis  eliquis  AM Labs Ordered, also please review Full Orders  Family Communication: Admission, patients condition and plan of care including tests being ordered have been discussed with the patient  who indicate understanding and agree with the plan and Code Status.  Code Status FULL CODE  Likely DC to  home  Condition GUARDED    Consults called: none  Admission status: inpatient   Time  spent in minutes : 45    Pearson Grippe M.D on 05/07/2017 at 1:00 AM  Between 7am to 7pm - Pager - [phone] . After 7pm go to [email or web address] - password [facility]  [facility] - Office  [phone]

## 2017-05-08 LAB — GLUCOSE, CAPILLARY
GLUCOSE-CAPILLARY: 289 mg/dL — AB (ref 65–99)
Glucose-Capillary: 284 mg/dL — ABNORMAL HIGH (ref 65–99)
Glucose-Capillary: 437 mg/dL — ABNORMAL HIGH (ref 65–99)
Glucose-Capillary: 273 mg/dL — ABNORMAL HIGH (ref 65–99)

## 2017-05-08 LAB — CBC
HCT: 28.1 % — ABNORMAL LOW (ref 39.0–52.0)
Hemoglobin: 8.1 g/dL — ABNORMAL LOW (ref 13.0–17.0)
MCH: 20.9 pg — ABNORMAL LOW (ref 26.0–34.0)
MCHC: 28.8 g/dL — ABNORMAL LOW (ref 30.0–36.0)
MCV: 72.6 fL — AB (ref 78.0–100.0)
PLATELETS: 208 10*3/uL (ref 150–400)
RBC: 3.87 MIL/uL — AB (ref 4.22–5.81)
RDW: 17.4 % — AB (ref 11.5–15.5)
WBC: 12.9 10*3/uL — ABNORMAL HIGH (ref 4.0–10.5)

## 2017-05-08 LAB — COMPREHENSIVE METABOLIC PANEL WITH GFR
ALT: 78 U/L — ABNORMAL HIGH (ref 17–63)
AST: 48 U/L — ABNORMAL HIGH (ref 15–41)
Albumin: 3.3 g/dL — ABNORMAL LOW (ref 3.5–5.0)
Alkaline Phosphatase: 46 U/L (ref 38–126)
Anion gap: 6 (ref 5–15)
BUN: 29 mg/dL — ABNORMAL HIGH (ref 6–20)
CO2: 31 mmol/L (ref 22–32)
Calcium: 8.5 mg/dL — ABNORMAL LOW (ref 8.9–10.3)
Chloride: 100 mmol/L — ABNORMAL LOW (ref 101–111)
Creatinine, Ser: 1 mg/dL (ref 0.61–1.24)
GFR calc Af Amer: >60 mL/min
GFR calc non Af Amer: >60 mL/min
Glucose, Bld: 262 mg/dL — ABNORMAL HIGH (ref 65–99)
Potassium: 4 mmol/L (ref 3.5–5.1)
Sodium: 137 mmol/L (ref 135–145)
Total Bilirubin: 0.8 mg/dL (ref 0.3–1.2)
Total Protein: 5.4 g/dL — ABNORMAL LOW (ref 6.5–8.1)

## 2017-05-08 LAB — HEMOGLOBIN A1C
HEMOGLOBIN A1C: 7.9 % — AB (ref 4.8–5.6)
MEAN PLASMA GLUCOSE: 180.03 mg/dL

## 2017-05-08 MED ORDER — INSULIN ASPART 100 UNIT/ML ~~LOC~~ SOLN
0.0000 [IU] | Freq: Three times a day (TID) | SUBCUTANEOUS | Status: DC
  Administered 2017-05-09: 15 [IU] via SUBCUTANEOUS
  Administered 2017-05-09: 4 [IU] via SUBCUTANEOUS
  Administered 2017-05-09: 15 [IU] via SUBCUTANEOUS

## 2017-05-08 MED ORDER — RISAQUAD PO CAPS
1.0000 | ORAL_CAPSULE | Freq: Every day | ORAL | Status: DC
  Administered 2017-05-08 – 2017-05-09 (×2): 1 via ORAL
  Filled 2017-05-08 (×2): qty 1

## 2017-05-08 MED ORDER — HYDROCORTISONE 2.5 % RE CREA
TOPICAL_CREAM | Freq: Two times a day (BID) | RECTAL | Status: DC
  Administered 2017-05-08 – 2017-05-09 (×3): via RECTAL
  Filled 2017-05-08: qty 28.35

## 2017-05-08 MED ORDER — INSULIN GLARGINE 100 UNIT/ML ~~LOC~~ SOLN
10.0000 [IU] | Freq: Every day | SUBCUTANEOUS | Status: DC
  Administered 2017-05-08: 10 [IU] via SUBCUTANEOUS
  Filled 2017-05-08 (×2): qty 0.1

## 2017-05-08 MED ORDER — INSULIN ASPART 100 UNIT/ML ~~LOC~~ SOLN
0.0000 [IU] | Freq: Every day | SUBCUTANEOUS | Status: DC
  Administered 2017-05-08: 3 [IU] via SUBCUTANEOUS

## 2017-05-08 MED ORDER — INSULIN ASPART 100 UNIT/ML ~~LOC~~ SOLN
20.0000 [IU] | Freq: Once | SUBCUTANEOUS | Status: AC
  Administered 2017-05-08: 20 [IU] via SUBCUTANEOUS

## 2017-05-08 NOTE — Progress Notes (Addendum)
[facility] Progress Note  Patient: John Marsh ZOX:[id number]   PCP: Eartha Inch, MD DOB: 1946/03/26   DOA: 05/06/2017   DOS: 05/08/2017   Date of Service: the patient was seen and examined on 05/08/2017  Subjective: Feeling better, continues to have shortness of breath more than his discharge today.  No chest pain abdominal pain.  Tolerating oral diet.  Brief hospital course: Pt. with PMH of COPD, chronic respiratory failure, 3 L oxygen at home, HTN, HLD, CAD, PVD, GERD; admitted on 05/06/2017, presented with complaint of shortness of breath and cough, was found to have COPD exacerbation with pneumonia. Currently further plan is continue current management.  Assessment and Plan: 1.  Acute hypoxic respiratory failure on chronic. COPD exacerbation. Acute bronchitis Recently admitted and discharged, came back within less than 24 hours with recurrence of symptoms with shortness of breath.  Started on IV steroids, duo nebs, IV Levaquin. During earlier admission CT chest PE protocol is negative for any pulmonary embolism, shows possible right pulmonary nodule. Urine streptococcal pneumonia and Legionella all negative, Flu PCR, respiratory virus panel all negative, Blood culture x2, no growth.  chest x-ray shows no infiltrates, just emphysematous changes Continue current management.  2.  New onset A. fib. Chads score is 3. Continue Cardizem and Eliquis. Outpatient follow-up with cardiology for discussion of further management, possible flutter ablation.  3.  Iron deficiency anemia. During last admission GI was consulted and recommend no further inpatient workup. Outpatient follow-up. Currently on Eliquis no bleeding reported.  5.  Chronic back pain. Continue gabapentin.  6.  Essential hypertension. Blood pressure stable. On Cardizem.  Monitor.  7.  Hyperglycemia. Type 2 diabetes mellitus. On carb modified diet. Hemoglobin A1c 7.9. Started on Lantus 10 units  nightly. Also on sliding scale insulin. Changing to resistant due to hyperglycemia due to steroids.  Diet: Cardiac, carb modified diet DVT Prophylaxis: on therapeutic anticoagulation.  Advance goals of care discussion: full code  Family Communication: no family was present at bedside, at the time of interview.   Disposition:  Discharge to home.  Consultants: none Procedures: none  Antibiotics: Anti-infectives (From admission, onward)   Start     Dose/Rate Route Frequency Ordered Stop   05/07/17 0500  levofloxacin (LEVAQUIN) IVPB 500 mg     500 mg 100 mL/hr over 60 Minutes Intravenous Every 24 hours 05/07/17 0427         Objective: Physical Exam: Vitals:   05/08/17 0850 05/08/17 1202 05/08/17 1451 05/08/17 1524  BP:   (!) 125/56   Pulse:   87   Resp:      Temp:   (!) 97.4 F (36.3 C)   TempSrc:   Oral   SpO2: 99% 98%  100%  Weight:      Height:        Intake/Output Summary (Last 24 hours) at 05/08/2017 1818 Last data filed at 05/08/2017 1627 Gross per 24 hour  Intake 819 ml  Output 1250 ml  Net -431 ml   Filed Weights   05/07/17 1805 05/08/17 0300  Weight: 82.1 kg (181 lb) 81.9 kg (180 lb 8 oz)   General: Alert, Awake and Oriented to Time, Place and Person. Appear in marked distress, affect appropriate Eyes: PERRL, Conjunctiva normal ENT: Oral Mucosa clear moist Neck: no JVD, no Abnormal Mass Or lumps Cardiovascular: S1 and S2 Present, no Murmur, Peripheral Pulses Present Respiratory: increased respiratory effort, Bilateral Air entry equal and Decreased, positive use of accessory muscle, no Crackles, bilateral expiratory  wheezes Abdomen: Bowel Sound present, Soft and no tenderness, no hernia Skin: n redness, no Rash, no induration Extremities: bilateral Pedal edema, no calf tenderness Neurologic: Grossly no focal neuro deficit. Bilaterally Equal motor strength  Data Reviewed: CBC: Recent Labs  Lab 05/02/17 0649  05/03/17 0410 05/04/17 0232  05/05/17 0243 05/06/17 0511 05/06/17 2206 05/07/17 0549 05/08/17 0400  WBC 13.1*  --  13.9* 12.3* 9.8 10.5 15.7* 18.2* 12.9*  NEUTROABS 11.9*  --  12.9* 11.2*  --   --   --   --   --   HGB 7.1*   < > 8.3* 8.2* 7.8* 8.0* 9.0* 9.3* 8.1*  HCT 25.6*   < > 28.7* 28.7* 27.8* 27.9* 31.5* 32.8* 28.1*  MCV 74.0*  --  73.4* 72.8* 72.6* 72.5* 73.3* 73.5* 72.6*  PLT 223  --  231 230 212 210 260 269 208   < > = values in this interval not displayed.   Basic Metabolic Panel: Recent Labs  Lab 05/05/17 0243 05/06/17 0511 05/06/17 2206 05/07/17 0549 05/08/17 0400  NA 136 139 138 137 137  K 3.8 3.6 3.9 3.7 4.0  CL 99* 100* 100* 100* 100*  CO2 28 32 29 24 31   GLUCOSE 290* 290* 471* 374* 262*  BUN 27* 31* 30* 30* 29*  CREATININE 1.01 0.98 1.21 1.25* 1.00  CALCIUM 8.4* 8.5* 9.0 8.6* 8.5*  MG 2.3  --   --   --   --     Liver Function Tests: Recent Labs  Lab 05/07/17 0549 05/08/17 0400  AST 63* 48*  ALT 101* 78*  ALKPHOS 54 46  BILITOT 0.5 0.8  PROT 6.8 5.4*  ALBUMIN 4.1 3.3*   No results for input(s): LIPASE, AMYLASE in the last 168 hours. No results for input(s): AMMONIA in the last 168 hours. Coagulation Profile: No results for input(s): INR, PROTIME in the last 168 hours. Cardiac Enzymes: Recent Labs  Lab 05/03/17 0044 05/03/17 0410 05/07/17 0549 05/07/17 1028 05/07/17 1828  TROPONINI 0.12* 0.14* 0.07* 0.06* 0.06*   BNP (last 3 results) No results for input(s): PROBNP in the last 8760 hours. CBG: Recent Labs  Lab 05/07/17 1626 05/07/17 2355 05/08/17 0811 05/08/17 1206 05/08/17 1712  GLUCAP 271* 358* 289* 284* 437*   Studies: No results found.  Scheduled Meds: . acidophilus  1 capsule Oral Daily  . albuterol  2.5 mg Nebulization QID  . apixaban  5 mg Oral BID  . arformoterol  15 mcg Nebulization BID  . benzonatate  100 mg Oral TID  . budesonide  0.5 mg Nebulization BID  . clopidogrel  75 mg Oral Daily  . diltiazem  360 mg Oral Daily  . feeding supplement  (ENSURE ENLIVE)  237 mL Oral TID BM  . gabapentin  100 mg Oral BID  . guaiFENesin  600 mg Oral BID  . hydrocortisone   Rectal BID  . [START ON 05/09/2017] insulin aspart  0-20 Units Subcutaneous TID WC  . insulin aspart  0-5 Units Subcutaneous QHS  . insulin aspart  20 Units Subcutaneous Once  . insulin glargine  10 Units Subcutaneous QHS  . ipratropium  0.5 mg Nebulization QID  . methylPREDNISolone (SOLU-MEDROL) injection  60 mg Intravenous Q12H  . montelukast  5 mg Oral QHS  . pantoprazole  40 mg Oral Daily  . pramipexole  0.125 mg Oral QHS  . rosuvastatin  20 mg Oral QHS  . saccharomyces boulardii  250 mg Oral BID   Continuous Infusions: . levofloxacin (  LEVAQUIN) IV Stopped (05/08/17 [id number])   PRN Meds: acetaminophen **OR** acetaminophen, albuterol, fluticasone, menthol-cetylpyridinium, nicotine, sodium chloride  Time spent: 35 minutes  Author: Lynden OxfordPranav Sula Fetterly, MD [facility] Pager: [phone] 05/08/2017 6:18 PM  If 7PM-7AM, please contact night-coverage at [email or web address], password [email or web address]

## 2017-05-08 NOTE — Progress Notes (Signed)
Inpatient Diabetes Program Recommendations  AACE/ADA: New Consensus Statement on Inpatient Glycemic Control (2015)  Target Ranges:  Prepandial:   less than 140 mg/dL      Peak postprandial:   less than 180 mg/dL (1-2 hours)      Critically ill patients:  140 - 180 mg/dL   Lab Results  Component Value Date   GLUCAP 289 (H) 05/08/2017   HGBA1C 7.9 (H) 05/08/2017    Review of Glycemic Control  Results for [name], John Marsh (MRN [id number]) as of 05/08/2017 09:53  Ref. Range 05/07/2017 11:53 05/07/2017 13:28 05/07/2017 16:26 05/07/2017 23:55 05/08/2017 08:11  Glucose-Capillary Latest Ref Range: 65 - 99 mg/dL 782390 (H) 956299 (H) 213271 (H) 358 (H) 289 (H)    Diabetes history: none noted-  A1C 7.9% Outpatient Diabetes medications: none noted Current orders for Inpatient glycemic control: Novolog 0-9 units tid, Novolog 0-5 units qhs  Inpatient Diabetes Program Recommendations:  Elevated CBG likely steroid induced-  A1C is 7.9% indicating the sugars have been high for about 3 months. Elevated A1C (6.5 or greater).Per ADA guidelines this meets the criteria of a diagnosis for diabetes. If appropriate, please consider placing this diagnosis in chart and inform patient of diagnosis.   Consider increasing Novolog correction to moderate 0-15units tid, add Lantus 10 units qhs and add Novolog 3 units tid with meals (hold if patient eats less than 50%)  Susette RacerJulie Seniyah Esker, RN, BA, [place], CDE Diabetes Coordinator Inpatient Diabetes Program  [phone] (Team Pager) [phone] [facility] Office) 05/08/2017 10:01 AM

## 2017-05-09 DIAGNOSIS — J471 Bronchiectasis with (acute) exacerbation: Secondary | ICD-10-CM

## 2017-05-09 DIAGNOSIS — J9621 Acute and chronic respiratory failure with hypoxia: Secondary | ICD-10-CM

## 2017-05-09 DIAGNOSIS — I5032 Chronic diastolic (congestive) heart failure: Secondary | ICD-10-CM

## 2017-05-09 DIAGNOSIS — J309 Allergic rhinitis, unspecified: Secondary | ICD-10-CM

## 2017-05-09 LAB — GLUCOSE, CAPILLARY
Glucose-Capillary: 323 mg/dL — ABNORMAL HIGH (ref 65–99)
Glucose-Capillary: 341 mg/dL — ABNORMAL HIGH (ref 65–99)
Glucose-Capillary: 179 mg/dL — ABNORMAL HIGH (ref 65–99)

## 2017-05-09 MED ORDER — HYDROCORTISONE 2.5 % RE CREA: TOPICAL_CREAM | Freq: Two times a day (BID) | RECTAL | 0 refills | Status: DC

## 2017-05-09 MED ORDER — PREDNISONE 10 MG PO TABS: ORAL_TABLET | ORAL | 0 refills | Status: DC

## 2017-05-09 MED ORDER — IPRATROPIUM-ALBUTEROL 0.5-2.5 (3) MG/3ML IN SOLN: 3.0000 mL | Freq: Two times a day (BID) | RESPIRATORY_TRACT | Status: DC

## 2017-05-09 MED ORDER — ALBUTEROL SULFATE (2.5 MG/3ML) 0.083% IN NEBU: 2.5000 mg | INHALATION_SOLUTION | RESPIRATORY_TRACT | Status: DC | PRN

## 2017-05-09 MED ORDER — SACCHAROMYCES BOULARDII 250 MG PO CAPS: 250.0000 mg | ORAL_CAPSULE | Freq: Two times a day (BID) | ORAL | 0 refills | Status: DC

## 2017-05-09 MED ORDER — IPRATROPIUM-ALBUTEROL 0.5-2.5 (3) MG/3ML IN SOLN
3.0000 mL | Freq: Four times a day (QID) | RESPIRATORY_TRACT | Status: DC
  Administered 2017-05-09 (×2): 3 mL via RESPIRATORY_TRACT
  Filled 2017-05-09 (×2): qty 3

## 2017-05-09 MED ORDER — NICOTINE 21 MG/24HR TD PT24: 21.0000 mg | MEDICATED_PATCH | Freq: Every day | TRANSDERMAL | 0 refills | Status: DC

## 2017-05-09 MED ORDER — LEVOFLOXACIN 750 MG PO TABS: 750.0000 mg | ORAL_TABLET | Freq: Every day | ORAL | 0 refills | Status: AC

## 2017-05-09 NOTE — Progress Notes (Signed)
Inpatient Diabetes Program Recommendations  AACE/ADA: New Consensus Statement on Inpatient Glycemic Control (2015)  Target Ranges:  Prepandial:   less than 140 mg/dL      Peak postprandial:   less than 180 mg/dL (1-2 hours)      Critically ill patients:  140 - 180 mg/dL   Results for John Marsh, John Marsh (MRN [id number]) as of 05/09/2017 09:28  Ref. Range 05/08/2017 08:11 05/08/2017 12:06 05/08/2017 17:12 05/08/2017 22:09 05/09/2017 08:07  Glucose-Capillary Latest Ref Range: 65 - 99 mg/dL 409289 (H) 811284 (H) 914437 (H) 273 (H) 323 (H)   Review of Glycemic Control  Diabetes history: DM 2 Outpatient Diabetes medications:  None diet controlled Current orders for Inpatient glycemic control: Lantus 10 units, Novolog Resistant Correction 0-20 units tid + Novolog HS scale 0-5 units  Inpatient Diabetes Program Recommendations:    Fasting glucose 323 this am. Current dose of IV solumedrol 60 mg Q12 hours. Please consider increasing Lantus to 20 units.  Thanks,  Christena DeemShannon Catricia Scheerer RN, MSN, [facility] Inpatient Diabetes Coordinator Team Pager [phone] (8a-5p)

## 2017-05-09 NOTE — Care Management Note (Signed)
Case Management Note  Patient Details  Name: John Marsh MRN: [id number] Date of Birth: 1946/04/17  Subjective/Objective:                    Action/Plan: Pt discharging home with self care. Pt has PCP, insurance and transportation home. No further needs per CM.  Expected Discharge Date:  05/09/17               Expected Discharge Plan:  Home/Self Care  In-House Referral:     Discharge planning Services  CM Consult  Post Acute Care Choice:    Choice offered to:     DME Arranged:    DME Agency:     HH Arranged:    HH Agency:     Status of Service:     If discussed at [employer] Meetings, dates discussed:    Additional Comments:  Kermit BaloKelli F Zarin Knupp, RN 05/09/2017, 4:58 PM

## 2017-05-09 NOTE — Discharge Instructions (Signed)

## 2017-05-09 NOTE — Progress Notes (Signed)
Pt given discharge instructions, prescriptions, and care notes. Pt verbalized understanding AEB no further questions or concerns at this time. IV was discontinued, no redness, pain, or swelling noted at this time. Telemetry discontinued and Centralized Telemetry was notified. Pt left the floor via wheelchair with staff in stable condition. 

## 2017-05-09 NOTE — Discharge Summary (Signed)
Physician Discharge Summary  RUSTON FEDORA BJY:[id number] DOB: Dec 26, 1945 DOA: 05/06/2017  PCP: Eartha Inch, MD  Admit date: 05/06/2017 Discharge date: 05/09/2017  Time spent: 35 minutes  Recommendations for Outpatient Follow-up:  1. Close follow-up the patient CBGs and initiation on hypoglycemic regimen as needed. 2. Reassess blood pressure and adjust antihypertensive medication. 3. Follow-up with cardiology as instructed.   Discharge Diagnoses:  Principal Problem:   COPD exacerbation (HCC) Active Problems:   Tachycardia   Anemia   Leukocytosis   COPD (chronic obstructive pulmonary disease) (HCC)   Chronic diastolic HF (heart failure) (HCC)   Bronchiectasis with acute exacerbation (HCC)   Discharge Condition: Stable and improved.  Patient discharged back home with instruction to follow-up with PCP in 10 days and also follow-up with his pulmonologist.  Diet recommendation: Modified carbohydrates and heart healthy diet.  Filed Weights   05/07/17 1805 05/08/17 0300 05/09/17 0545  Weight: 82.1 kg (181 lb) 81.9 kg (180 lb 8 oz) 80 kg (176 lb 6.4 oz)    History of present illness:  71 year old male with a past medical history significant for chronic respiratory failure (2-3 L nasal cannula supplementation at home) due to chronic COPD; hypertension, hyperlipidemia, history of coronary artery histories, chronic diastolic heart failure, GERD and recent admission due to shortness of breath and cough; who was found to have COPD exacerbation with bronchiectasis and was readmitted for further evaluation and treatment.  Hospital Course:  Acute on chronic respiratory failure with hypoxia: Appears to be secondary to COPD exacerbation with bronchiectasis. -Patient improved with the use of IV steroids, nebulizer and antibiotics. -At discharge no complains of significant shortness of breath, nausea, vomiting, chest pain, or any other complaints. -Patient was able to speak in full  sentences and his oxygen saturation remained above 96% on 2 L while at rest and exertion. -Patient discharged home with instruction for steroids tapering, will finalize the use of antibiotics as instructed and will resume his nebulizer/inhaler regimen as previously prescribed by his pulmonologist. -Patient will have follow-up with his PCP in 10 days and the pulmonary service will also arrange follow-up with them the next 2-3 weeks.  Atrial fibrillation/atrial flutter: Paroxysmal -Rate controlled -CHADSVASC score 3 -Continue Cardizem and Eliquis  Iron deficiency anemia -Patient to follow-up with GI as an outpatient. -Continue Eliquis -No signs of acute bleeding appreciated; hemoglobin has remained stable.  Chronic back pain -Continue gabapentin.  Essential hypertension -Advised to follow a low-sodium diet -Will continue Cardizem  Hyperglycemia in a patient with type 2 diabetes -Most likely due to use of steroids -Continue modified carbohydrate diet -Patient to follow-up with PCP for further adjustment/initiation on hypoglycemic regimen as needed based on CBGs fluctuation once completely off of steroids. -expressed following diet only -last A1C 7.9  Tobacco abuse -Nicotine patch has been prescribed -Cessation counseling encouraged  History of coronary artery disease -No chest pain -Continue aspirin and statins -PRN nitroglycerin.  Procedures:  See below for x-ray report.  Consultations:  Case curbside with pulmonary service (Dr. Jamison Neighbor)  Discharge Exam: Vitals:   05/09/17 1418 05/09/17 1451  BP: 134/62   Pulse: 88   Resp: 15   Temp: 97.8 F (36.6 C)   SpO2: 100% 100%    General: No wheezing, no using accessory muscles, good air movement bilaterally, good oxygen saturation on 2 L nasal cannula supplementation.  Patient denies chest pain, nausea, vomiting and abdominal pain. Cardiovascular: Regular rate, no rubs, no gallops Respiratory: Overall improved air  movement, no using accessory muscles,  no wheezing, no crackles. Abdomen: Soft, nontender, nondistended, positive bowel sounds Extremities trace edema bilaterally, no cyanosis or clubbing.  Discharge Instructions   Discharge Instructions    Diet - low sodium heart healthy   Complete by:  As directed    Discharge instructions   Complete by:  As directed    Take medications as prescribed Keep yourself well-hydrated Stop smoking Minimize exposure to cold temperature and use humidifier inside house Follow-up with primary care doctor in 10 days Follow-up with cardiopulmonary service as an outpatient (office will set up appointment for you) Follow low sodium diet (no more than 2.5 gram daily)     Current Discharge Medication List    START taking these medications   Details  hydrocortisone (ANUSOL-HC) 2.5 % rectal cream Place rectally 2 (two) times daily. Qty: 30 g, Refills: 0    levofloxacin (LEVAQUIN) 750 MG tablet Take 1 tablet (750 mg total) by mouth daily for 8 days. Qty: 8 tablet, Refills: 0    saccharomyces boulardii (FLORASTOR) 250 MG capsule Take 1 capsule (250 mg total) by mouth 2 (two) times daily. Qty: 30 capsule, Refills: 0      CONTINUE these medications which have CHANGED   Details  nicotine (NICODERM CQ - DOSED IN MG/24 HOURS) 21 mg/24hr patch Place 1 patch (21 mg total) onto the skin daily. Qty: 28 patch, Refills: 0    predniSONE (DELTASONE) 10 MG tablet Take 60mg  daily for 3 days,Take 40mg  daily for 3days,Take 30mg  daily for 3days,Take 20mg  daily for 3days,Take 10mg  daily for 3days, then stop. Qty: 40 tablet, Refills: 0      CONTINUE these medications which have NOT CHANGED   Details  albuterol (PROVENTIL HFA;VENTOLIN HFA) 108 (90 Base) MCG/ACT inhaler Inhale 2 puffs into the lungs every 6 (six) hours as needed for wheezing or shortness of breath. Qty: 1 Inhaler, Refills: 6    apixaban (ELIQUIS) 5 MG TABS tablet Take 1 tablet (5 mg total) 2 (two) times daily  by mouth. Qty: 60 tablet, Refills: 0    benzonatate (TESSALON) 100 MG capsule Take 1 capsule (100 mg total) 3 (three) times daily by mouth. Qty: 20 capsule, Refills: 0    budesonide (PULMICORT) 0.5 MG/2ML nebulizer solution Take 2 mLs (0.5 mg total) by nebulization 2 (two) times daily. Qty: 60 mL, Refills: 3    diltiazem (CARDIZEM CD) 360 MG 24 hr capsule Take 1 capsule (360 mg total) daily by mouth. Qty: 60 capsule, Refills: 0    feeding supplement, ENSURE ENLIVE, (ENSURE ENLIVE) LIQD Take 237 mLs by mouth 3 (three) times daily between meals. Qty: 30 Bottle, Refills: 0    fluticasone (FLONASE) 50 MCG/ACT nasal spray Place 2 sprays into both nostrils daily as needed for allergies or rhinitis. Qty: 16 g, Refills: 0    formoterol (PERFOROMIST) 20 MCG/2ML nebulizer solution Take 2 mLs (20 mcg total) by nebulization 2 (two) times daily. Qty: 60 mL, Refills: 3    gabapentin (NEURONTIN) 100 MG capsule Take 1 capsule (100 mg total) 2 (two) times daily by mouth. Qty: 60 capsule, Refills: 0    guaiFENesin (MUCINEX) 600 MG 12 hr tablet Take 1 tablet (600 mg total) by mouth 2 (two) times daily. Qty: 30 tablet, Refills: 0    ipratropium (ATROVENT) 0.02 % nebulizer solution Take 2.5 mLs (0.5 mg total) every 6 (six) hours as needed by nebulization for wheezing or shortness of breath. Qty: 75 mL, Refills: 12    levalbuterol (XOPENEX) 1.25 MG/0.5ML nebulizer solution Take 1.25  mg every 6 (six) hours as needed by nebulization for wheezing or shortness of breath. Qty: 30 each, Refills: 12    menthol-cetylpyridinium (CEPACOL) 3 MG lozenge Take 1 lozenge (3 mg total) as needed by mouth for sore throat. Qty: 100 tablet, Refills: 12    montelukast (SINGULAIR) 5 MG chewable tablet CHEW 1 TABLET BY MOUTH AT BEDTIME Qty: 30 tablet, Refills: 3    pantoprazole (PROTONIX) 40 MG tablet Take 1 tablet (40 mg total) by mouth daily. Qty: 30 tablet, Refills: 0    pramipexole (MIRAPEX) 0.125 MG tablet Take 1  tablet by mouth at bedtime.    rosuvastatin (CRESTOR) 20 MG tablet Take 20 mg by mouth at bedtime.    sodium chloride (OCEAN) 0.65 % SOLN nasal spray Place 1 spray as needed into both nostrils for congestion (nose irritation). Qty: 2 Bottle, Refills: 0    Spacer/Aero-Holding Chambers (AEROCHAMBER Z-STAT PLUS) inhaler Use as instructed Qty: 1 each, Refills: 0    triamcinolone (NASACORT ALLERGY 24HR) 55 MCG/ACT AERO nasal inhaler Place 2 sprays daily as needed into the nose (allergies).     nitroGLYCERIN (NITROSTAT) 0.4 MG SL tablet Place 0.4 mg under the tongue every 5 (five) minutes as needed for chest pain. x3 doses as needed for chest pain    tiotropium (SPIRIVA) 18 MCG inhalation capsule Place 18 mcg into inhaler and inhale daily.      STOP taking these medications     clopidogrel (PLAVIX) 75 MG tablet      doxycycline (VIBRA-TABS) 100 MG tablet        No Known Allergies Follow-up Information    Eartha Inch, MD. Schedule an appointment as soon as possible for a visit in 10 day(s).   Specialty:  Family Medicine Contact information: [address] [place] [address] [phone]        Roslynn Amble, MD Follow up.   Specialty:  Pulmonary Disease Why:  office will contact you with appointment details. Contact information: [address] [place] [address] [phone]                                                                                                    The results of significant diagnostics from this hospitalization (including imaging, microbiology, ancillary and laboratory) are listed below for reference.    Significant Diagnostic Studies: Dg Chest 2 View  Result Date: 05/06/2017 CLINICAL DATA:  Shortness of breath after discharge from hospital this morning. EXAM: CHEST  2 VIEW COMPARISON:  05/02/2017 FINDINGS: Heart size and pulmonary vascularity are normal. Emphysematous changes in the lungs with scattered  fibrosis. Scattered calcified granulomas bilaterally. No developing consolidation or airspace disease. No blunting of costophrenic angles. No pneumothorax. Mediastinal contours appear intact. Calcification of the aorta. IMPRESSION: Emphysematous changes and scattered fibrosis in the lungs. No evidence of active pulmonary disease. Aortic atherosclerosis. Electronically Signed   By: Burman Nieves M.D.   On: 05/06/2017 23:59   Ct Angio Chest Pe W Or Wo Contrast  Result Date: 05/01/2017 CLINICAL DATA:  Evaluate for acute pulmonary embolus. Shortness  of breath. EXAM: CT ANGIOGRAPHY CHEST WITH CONTRAST TECHNIQUE: Multidetector CT imaging of the chest was performed using the standard protocol during bolus administration of intravenous contrast. Multiplanar CT image reconstructions and MIPs were obtained to evaluate the vascular anatomy. CONTRAST:  ISOVUE-370 IOPAMIDOL (ISOVUE-370) INJECTION 76% COMPARISON:  02/01/2017 FINDINGS: Cardiovascular: Satisfactory opacification of the pulmonary arteries to the segmental level. No evidence of pulmonary embolism. Normal heart size. No pericardial effusion. Aortic atherosclerosis. LAD coronary artery calcifications noted. Mediastinum/Nodes: The trachea is patent and midline. Normal appearance of the esophagus. No mediastinal or hilar adenopathy. Lungs/Pleura: No pleural effusion. Calcified granuloma identified in the left upper lobe. Moderate to advanced changes of centrilobular and paraseptal emphysema. No airspace consolidation. Scarring and architectural distortion within the right upper lobe is again noted compatible with resolving inflammation/infection. Pulmonary nodule within the superior segment of the left lower lobe measures 4 mm, image 72 of series 6. Decreased from 7 mm previously. Right lower lobe pulmonary nodule is identified measuring 7 mm, image 85 of series 6. New from comparison exam. Upper Abdomen: No acute abnormality. Musculoskeletal: There is  degenerative disc disease noted within the thoracic spine. No aggressive lytic or sclerotic bone lesions. Review of the MIP images confirms the above findings. IMPRESSION: 1. No evidence for acute pulmonary embolus. 2. Aortic Atherosclerosis (ICD10-I70.0) and Emphysema (ICD10-J43.9). Lad coronary artery calcifications noted. 3. Right lower lobe pulmonary nodule is new from comparison exam measuring 7 mm. Non-contrast chest CT at 6-12 months is recommended. If the nodule is stable at time of repeat CT, then future CT at 18-24 months (from today's scan) is considered optional for low-risk patients, but is recommended for high-risk patients. This recommendation follows the consensus statement: Guidelines for Management of Incidental Pulmonary Nodules Detected on CT Images: From the Fleischner Society 2017; Radiology 2017; 284:228-243. Electronically Signed   By: Signa Kell M.D.   On: 05/01/2017 17:40   Ct Abdomen Pelvis W Contrast  Result Date: 05/03/2017 CLINICAL DATA:  Anemia.  Evaluate for retroperitoneal hemorrhage. EXAM: CT ABDOMEN AND PELVIS WITH CONTRAST TECHNIQUE: Multidetector CT imaging of the abdomen and pelvis was performed using the standard protocol following bolus administration of intravenous contrast. CONTRAST:  ISOVUE-300 IOPAMIDOL (ISOVUE-300) INJECTION 61% COMPARISON:  CT angiogram 03/02/2004. FINDINGS: Lower chest:  Emphysema.  Calcified granulomata noted bilaterally. Hepatobiliary: No focal abnormality within the liver parenchyma. Gallbladder surgically absent. No intrahepatic or extrahepatic biliary dilation. Pancreas: No focal mass lesion. No dilatation of the main duct. No intraparenchymal cyst. No peripancreatic edema. Spleen: No splenomegaly. No focal mass lesion. Adrenals/Urinary Tract: No adrenal nodule or mass. Kidneys are unremarkable. No evidence for hydroureter. The urinary bladder appears normal for the degree of distention. Stomach/Bowel: Stomach is nondistended. No  gastric wall thickening. No evidence of outlet obstruction. Duodenum is normally positioned as is the ligament of Treitz. No small bowel wall thickening. No small bowel dilatation. The terminal ileum is normal. The appendix is normal. Prominent stool volume. Vascular/Lymphatic: There is abdominal aortic atherosclerosis without aneurysm. There is no gastrohepatic or hepatoduodenal ligament lymphadenopathy. No intraperitoneal or retroperitoneal lymphadenopathy. No pelvic sidewall lymphadenopathy. Reproductive: The prostate gland and seminal vesicles have normal imaging features. Other: No intraperitoneal free fluid. Musculoskeletal: Bone windows reveal no worrisome lytic or sclerotic osseous lesions. No evidence for retroperitoneal hemorrhage/ hematoma. IMPRESSION: 1. No CT findings to explain the patient's history of anemia. Specifically, no retroperitoneal hemorrhage or hematoma. 2.  Aortic Atherosclerois (ICD10-170.0) 3.  Emphysema. (ZOX09-U04.9) Electronically Signed   By: Kennith Center  M.D.   On: 05/03/2017 15:12   Dg Chest Port 1 View  Result Date: 05/02/2017 CLINICAL DATA:  Shortness of Breath EXAM: PORTABLE CHEST 1 VIEW COMPARISON:  Chest CT May 01, 2017 and chest radiograph May 01, 2017 FINDINGS: There is underlying emphysematous change with bullous disease in the upper lobes. There is a small calcified granuloma in the left upper lobe. There is no edema or consolidation. Heart is mildly enlarged. The pulmonary vascularity reflects the underlying emphysematous change with decreased vascularity to the upper lobes. No adenopathy. No bone lesions. IMPRESSION: Underlying emphysematous change. No edema or consolidation. Stable cardiac silhouette with heart mildly enlarged given degree of underlying emphysema. Small calcified granuloma left upper lobe. Emphysema (ICD10-J43.9). Electronically Signed   By: Bretta BangWilliam  Woodruff III M.D.   On: 05/02/2017 14:40   Dg Chest Portable 1 View  Result Date:  05/01/2017 CLINICAL DATA:  Acute onset of worsening shortness of breath. EXAM: PORTABLE CHEST 1 VIEW COMPARISON:  Chest radiograph performed 09/21/2016, and CT of the chest performed 02/01/2017 FINDINGS: The lungs are hyperexpanded, with flattening of the hemidiaphragms compatible with COPD. Scarring is noted at the right lung apex. Mild right basilar airspace opacity could reflect mild pneumonia. There is no evidence of pleural effusion or pneumothorax. The cardiomediastinal silhouette is normal in size. No acute osseous abnormalities are identified. IMPRESSION: 1. Mild right basilar airspace opacity could reflect mild pneumonia. 2. Findings of COPD, with scarring at the right lung apex. Electronically Signed   By: Roanna RaiderJeffery  Chang M.D.   On: 05/01/2017 02:21    Microbiology: Recent Results (from the past 240 hour(s))  Culture, blood (x 2)     Status: None   Collection Time: 05/01/17  5:45 AM  Result Value Ref Range Status   Specimen Description BLOOD RIGHT ARM  Final   Special Requests   Final    BOTTLES DRAWN AEROBIC AND ANAEROBIC Blood Culture results may not be optimal due to an excessive volume of blood received in culture bottles PATIENT ON FOLLOWING ZITHROMAX 500MG  ROCEPHIN 1GM   Culture NO GROWTH 5 DAYS  Final   Report Status 05/06/2017 FINAL  Final  Culture, blood (x 2)     Status: None   Collection Time: 05/01/17  6:00 AM  Result Value Ref Range Status   Specimen Description BLOOD RIGHT HAND  Final   Special Requests   Final    IN PEDIATRIC BOTTLE Blood Culture results may not be optimal due to an excessive volume of blood received in culture bottles PATIENT ON FOLLOWING ZITHROMAX 500MG  ROCEPHIN 1GM   Culture NO GROWTH 5 DAYS  Final   Report Status 05/06/2017 FINAL  Final  Respiratory Panel by PCR     Status: None   Collection Time: 05/01/17 12:08 PM  Result Value Ref Range Status   Adenovirus NOT DETECTED NOT DETECTED Final   Coronavirus 229E NOT DETECTED NOT DETECTED Final    Coronavirus HKU1 NOT DETECTED NOT DETECTED Final   Coronavirus NL63 NOT DETECTED NOT DETECTED Final   Coronavirus OC43 NOT DETECTED NOT DETECTED Final   Metapneumovirus NOT DETECTED NOT DETECTED Final   Rhinovirus / Enterovirus NOT DETECTED NOT DETECTED Final   Influenza A NOT DETECTED NOT DETECTED Final   Influenza B NOT DETECTED NOT DETECTED Final   Parainfluenza Virus 1 NOT DETECTED NOT DETECTED Final   Parainfluenza Virus 2 NOT DETECTED NOT DETECTED Final   Parainfluenza Virus 3 NOT DETECTED NOT DETECTED Final   Parainfluenza Virus 4 NOT DETECTED NOT  DETECTED Final   Respiratory Syncytial Virus NOT DETECTED NOT DETECTED Final   Bordetella pertussis NOT DETECTED NOT DETECTED Final   Chlamydophila pneumoniae NOT DETECTED NOT DETECTED Final   Mycoplasma pneumoniae NOT DETECTED NOT DETECTED Final     Labs: Basic Metabolic Panel: Recent Labs  Lab 05/05/17 0243 05/06/17 0511 05/06/17 2206 05/07/17 0549 05/08/17 0400  NA 136 139 138 137 137  K 3.8 3.6 3.9 3.7 4.0  CL 99* 100* 100* 100* 100*  CO2 28 32 29 24 31   GLUCOSE 290* 290* 471* 374* 262*  BUN 27* 31* 30* 30* 29*  CREATININE 1.01 0.98 1.21 1.25* 1.00  CALCIUM 8.4* 8.5* 9.0 8.6* 8.5*  MG 2.3  --   --   --   --    Liver Function Tests: Recent Labs  Lab 05/07/17 0549 05/08/17 0400  AST 63* 48*  ALT 101* 78*  ALKPHOS 54 46  BILITOT 0.5 0.8  PROT 6.8 5.4*  ALBUMIN 4.1 3.3*   CBC: Recent Labs  Lab 05/03/17 0410 05/04/17 0232 05/05/17 0243 05/06/17 0511 05/06/17 2206 05/07/17 0549 05/08/17 0400  WBC 13.9* 12.3* 9.8 10.5 15.7* 18.2* 12.9*  NEUTROABS 12.9* 11.2*  --   --   --   --   --   HGB 8.3* 8.2* 7.8* 8.0* 9.0* 9.3* 8.1*  HCT 28.7* 28.7* 27.8* 27.9* 31.5* 32.8* 28.1*  MCV 73.4* 72.8* 72.6* 72.5* 73.3* 73.5* 72.6*  PLT 231 230 212 210 260 269 208   Cardiac Enzymes: Recent Labs  Lab 05/03/17 0044 05/03/17 0410 05/07/17 0549 05/07/17 1028 05/07/17 1828  TROPONINI 0.12* 0.14* 0.07* 0.06* 0.06*    BNP: BNP (last 3 results) Recent Labs    05/02/17 1850  BNP 481.7*   CBG: Recent Labs  Lab 05/08/17 1206 05/08/17 1712 05/08/17 2209 05/09/17 0807 05/09/17 1201  GLUCAP 284* 437* 273* 323* 341*    Signed:  Vassie Lollarlos Marycatherine Maniscalco MD.  [facility] 05/09/2017, 4:25 PM

## 2017-05-15 ENCOUNTER — Telehealth: Payer: Self-pay | Admitting: Pulmonary Disease

## 2017-05-15 NOTE — Telephone Encounter (Signed)
Left message to call back. Per Florentina AddisonKatie ok to cancel CT. [place].  Lungs/Pleura: No pleural effusion. Calcified granuloma identified in the left upper lobe. Moderate to advanced changes of centrilobular and paraseptal emphysema. No airspace consolidation. Scarring and architectural distortion within the right upper lobe is again noted compatible with resolving inflammation/infection. Pulmonary nodule within the superior segment of the left lower lobe measures 4 mm, image 72 of series 6. Decreased from 7 mm previously. Right lower lobe pulmonary nodule is identified measuring 7 mm, image 85 of series 6. New from comparison exam.  Upper Abdomen: No acute abnormality.  Musculoskeletal: There is degenerative disc disease noted within the thoracic spine. No aggressive lytic or sclerotic bone lesions.  Review of the MIP images confirms the above findings.  IMPRESSION: 1. No evidence for acute pulmonary embolus. 2. Aortic Atherosclerosis (ICD10-I70.0) and Emphysema (ICD10-J43.9). Lad coronary artery calcifications noted. 3. Right lower lobe pulmonary nodule is new from comparison exam measuring 7 mm. Non-contrast chest CT at 6-12 months is recommended. If the nodule is stable at time of repeat CT, then future CT at 18-24 months (from today's scan) is considered optional for low-risk patients, but is recommended for high-risk patients. This recommendation follows the consensus statement: Guidelines for Management of Incidental Pulmonary Nodules Detected on CT Images: From the Fleischner Society 2017; Radiology 2017; 284:228-243.

## 2017-05-16 ENCOUNTER — Ambulatory Visit (HOSPITAL_COMMUNITY): Payer: Medicare Other

## 2017-05-16 NOTE — Telephone Encounter (Signed)
Per JN per verbally-keep apt to review CT.  Pt has been made aware and voiced his understanding.  Nothing further needed.

## 2017-05-16 NOTE — Telephone Encounter (Signed)
Pt's spouse, Steward DroneBrenda is aware of below message and voiced her understanding.  PCC's please cancel CT with [facility]. Thanks you Steward DroneBrenda states pt is scheduled for OV with JN on 05/17/17. Steward DroneBrenda wants to know if pt should keep apt, since CT has been canceled.  JN please advise. Thanks.

## 2017-05-16 NOTE — Telephone Encounter (Signed)
That's fine

## 2017-05-17 ENCOUNTER — Ambulatory Visit (INDEPENDENT_AMBULATORY_CARE_PROVIDER_SITE_OTHER): Payer: Medicare Other | Admitting: Pulmonary Disease

## 2017-05-17 VITALS — BP 130/70 | HR 82 | Ht 72.0 in | Wt 168.5 lb

## 2017-05-17 DIAGNOSIS — R911 Solitary pulmonary nodule: Secondary | ICD-10-CM

## 2017-05-17 DIAGNOSIS — J449 Chronic obstructive pulmonary disease, unspecified: Secondary | ICD-10-CM | POA: Diagnosis not present

## 2017-05-17 DIAGNOSIS — I70223 Atherosclerosis of native arteries of extremities with rest pain, bilateral legs: Secondary | ICD-10-CM

## 2017-05-17 DIAGNOSIS — IMO0001 Reserved for inherently not codable concepts without codable children: Secondary | ICD-10-CM

## 2017-05-17 MED ORDER — IPRATROPIUM BROMIDE 0.02 % IN SOLN: 0.5000 mg | Freq: Four times a day (QID) | RESPIRATORY_TRACT | 3 refills | Status: DC

## 2017-05-17 NOTE — Progress Notes (Signed)
Subjective:    Patient ID: John Marsh, male    DOB: 29-Sep-1945, 71 y.o.   MRN: [id number]  C.C.:  Follow-up for Very Severe COPD w/ Emphysema, Multiple Lung Nodules, Chronic Hypoxic Respiratory Failure, Chronic Seasonal Allergic Rhinitis, & Tobacco Use Disorder.  HPI Reviewing the patient's electronic medical record shows he was hospitalized 11/18 through 11/21 for a COPD exacerbation along with acute on chronic hypoxic respiratory failure. He was discharged with a steroid taper.   Very severe COPD with emphysema: Previously prescribed Spiriva and Symbicort. He does feel his degree of dyspnea is improving significantly week by week. No coughing or wheezing. He has transitioned over to using Perforomist and Pulmicort feeling like it has helped. He hasn't yet picked up his Atrovent to use in place of Spiriva. Hasn't required his Xopenex as much. Previously was doing Pulmonary Rehab for 2 years.   Multiple long Nodules: Initially found have a groundglass nodule in right upper lobe March 2016 with new subcentimeter groundglass nodules bilaterally noted in August 2016. Patient also notably has calcified lung nodules as well as splenic calcifications and mediastinal lymph node calcifications suspicious for prior granulomatous exposure with previous residence in [place] and [place]. Patient underwent CT angiogram of the chest on November 13 which didn't show a new 7 mm right lower lobe nodule.  Chronic hypoxic respiratory failure: Previously prescribed oxygen at 2 L/m while sleeping and 3 L/m with exertion but patient's walk test on ambulation at last appointment did not show any oxygen requirement. Reportedly previously qualified for a portable oxygen concentrator. He is still using oxygen at 2 L/m.   Chronic seasonal allergic rhinitis: Prescribed both Nasacort and Singulair. Denies any sinus congestion, pressure or drainage. He is using a saline nasal spray.   Tobacco use disorder: Patient has used  nicotine patches in the past and has admitted to "sneaking" a cigarette at most once daily.   Review of Systems No fever, chills, or sweats. No chest pain or pressure. No abdominal pain or nausea.   No Known Allergies  Current Outpatient Medications on File Prior to Visit  Medication Sig Dispense Refill  . albuterol (PROVENTIL HFA;VENTOLIN HFA) 108 (90 Base) MCG/ACT inhaler Inhale 2 puffs into the lungs every 6 (six) hours as needed for wheezing or shortness of breath. 1 Inhaler 6  . apixaban (ELIQUIS) 5 MG TABS tablet Take 1 tablet (5 mg total) 2 (two) times daily by mouth. 60 tablet 0  . benzonatate (TESSALON) 100 MG capsule Take 1 capsule (100 mg total) 3 (three) times daily by mouth. 20 capsule 0  . budesonide (PULMICORT) 0.5 MG/2ML nebulizer solution Take 2 mLs (0.5 mg total) by nebulization 2 (two) times daily. 60 mL 3  . diltiazem (CARDIZEM CD) 360 MG 24 hr capsule Take 1 capsule (360 mg total) daily by mouth. 60 capsule 0  . feeding supplement, ENSURE ENLIVE, (ENSURE ENLIVE) LIQD Take 237 mLs by mouth 3 (three) times daily between meals. 30 Bottle 0  . fluticasone (FLONASE) 50 MCG/ACT nasal spray Place 2 sprays into both nostrils daily as needed for allergies or rhinitis. 16 g 0  . formoterol (PERFOROMIST) 20 MCG/2ML nebulizer solution Take 2 mLs (20 mcg total) by nebulization 2 (two) times daily. 60 mL 3  . gabapentin (NEURONTIN) 100 MG capsule Take 1 capsule (100 mg total) 2 (two) times daily by mouth. 60 capsule 0  . guaiFENesin (MUCINEX) 600 MG 12 hr tablet Take 1 tablet (600 mg total) by mouth 2 (two) times  daily. 30 tablet 0  . hydrocortisone (ANUSOL-HC) 2.5 % rectal cream Place rectally 2 (two) times daily. 30 g 0  . ipratropium (ATROVENT) 0.02 % nebulizer solution Take 2.5 mLs (0.5 mg total) every 6 (six) hours as needed by nebulization for wheezing or shortness of breath. 75 mL 12  . levalbuterol (XOPENEX) 1.25 MG/0.5ML nebulizer solution Take 1.25 mg every 6 (six) hours as  needed by nebulization for wheezing or shortness of breath. 30 each 12  . levofloxacin (LEVAQUIN) 750 MG tablet Take 1 tablet (750 mg total) by mouth daily for 8 days. 8 tablet 0  . menthol-cetylpyridinium (CEPACOL) 3 MG lozenge Take 1 lozenge (3 mg total) as needed by mouth for sore throat. 100 tablet 12  . montelukast (SINGULAIR) 5 MG chewable tablet CHEW 1 TABLET BY MOUTH AT BEDTIME 30 tablet 3  . nicotine (NICODERM CQ - DOSED IN MG/24 HOURS) 21 mg/24hr patch Place 1 patch (21 mg total) onto the skin daily. 28 patch 0  . nitroGLYCERIN (NITROSTAT) 0.4 MG SL tablet Place 0.4 mg under the tongue every 5 (five) minutes as needed for chest pain. x3 doses as needed for chest pain    . pantoprazole (PROTONIX) 40 MG tablet Take 1 tablet (40 mg total) by mouth daily. 30 tablet 0  . pramipexole (MIRAPEX) 0.125 MG tablet Take 1 tablet by mouth at bedtime.    . predniSONE (DELTASONE) 10 MG tablet Take 60mg  daily for 3 days,Take 40mg  daily for 3days,Take 30mg  daily for 3days,Take 20mg  daily for 3days,Take 10mg  daily for 3days, then stop. 40 tablet 0  . rosuvastatin (CRESTOR) 20 MG tablet Take 20 mg by mouth at bedtime.    . saccharomyces boulardii (FLORASTOR) 250 MG capsule Take 1 capsule (250 mg total) by mouth 2 (two) times daily. 30 capsule 0  . sodium chloride (OCEAN) 0.65 % SOLN nasal spray Place 1 spray as needed into both nostrils for congestion (nose irritation). 2 Bottle 0  . Spacer/Aero-Holding Chambers (AEROCHAMBER Z-STAT PLUS) inhaler Use as instructed 1 each 0  . tiotropium (SPIRIVA) 18 MCG inhalation capsule Place 18 mcg into inhaler and inhale daily.    Marland Kitchen. triamcinolone (NASACORT ALLERGY 24HR) 55 MCG/ACT AERO nasal inhaler Place 2 sprays daily as needed into the nose (allergies).      No current facility-administered medications on file prior to visit.    Past Medical History:  Diagnosis Date  . COPD (chronic obstructive pulmonary disease) (HCC)   . Coronary heart disease   . Emphysema of  lung (HCC)   . Hyperlipidemia   . Hypertension   . MI (myocardial infarction) (HCC)   . Multiple lung nodules on CT   . Nocturnal hypoxia   . Peripheral arterial disease (HCC)   . Shortness of breath dyspnea    Past Surgical History:  Procedure Laterality Date  . CHOLECYSTECTOMY    . NASAL SEPTUM SURGERY    . PERIPHERAL VASCULAR CATHETERIZATION N/A 07/27/2015   Procedure: Abdominal Aortogram;  Surgeon: Chuck Hinthristopher S Dickson, MD;  [facility];  Service: Cardiovascular;  Laterality: N/A;  . PERIPHERAL VASCULAR CATHETERIZATION Bilateral 07/27/2015   Procedure: Lower Extremity Angiography;  Surgeon: Chuck Hinthristopher S Dickson, MD;  [facility];  Service: Cardiovascular;  Laterality: Bilateral;  . stent placed     Family History  Problem Relation Age of Onset  . Heart disease Mother        died at 5257  . Heart disease Father   . Heart disease Brother   .  Heart disease Sister   . Lung disease Neg Hx    Social History   Socioeconomic History  . Marital status: Married    Spouse name: Not on file  . Number of children: Not on file  . Years of education: Not on file  . Highest education level: Not on file  Social Needs  . Financial resource strain: Not on file  . Food insecurity - worry: Not on file  . Food insecurity - inability: Not on file  . Transportation needs - medical: Not on file  . Transportation needs - non-medical: Not on file  Occupational History  . Occupation: retired  Tobacco Use  . Smoking status: Current Some Day Smoker    Packs/day: 0.50    Years: 55.00    Pack years: 27.50    Types: Cigarettes    Start date: 08/01/1959  . Smokeless tobacco: Never Used  . Tobacco comment: 1/2 ppd 06/03/15 - peak 3ppd   taking Chantix currently 07-20-2015  Substance and Sexual Activity  . Alcohol use: No    Alcohol/week: 0.0 oz  . Drug use: No  . Sexual activity: No  Other Topics Concern  . Not on file  Social History Narrative   Originally  from [place]. Previously has lived in [place]. He moved to [facility] in [place]. He serve in [place]. 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 He has been to [place], [place], [place], several countries in [place], countries in [place], [place], & multiple [place]. 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 He has a dog, 7 cats, & 2 horses. Remote exposure to a parrot for a few years in a different home. No mold exposure. No hot tub exposure. 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       Objective:   Physical Exam BP 130/70 (BP Location: Left Arm, Cuff Size: Normal)   Pulse 82   Ht 6' (1.829 m)   Wt 168 lb 8 oz (76.4 kg)   SpO2 97%   BMI 22.85 kg/m   General:  Awake. Caucasian male. No distress & accompanied by wife today.  Integument:  No rash. Warm. Dry. Extremities:  No cyanosis or clubbing.  HEENT:  Moist mucous membranes. No oral ulcers. No scleral icterus. Cardiovascular:  Regular rate. No edema. Unable to appreciate JVD.  Pulmonary:  Good aeration bilaterally despite diffuse wheezing for approximately 25% of exhalation phase. Normal work of breathing on supplemental oxygen. Abdomen: Soft. Normal bowel sounds. Mildly protuberant. Musculoskeletal:  Normal bulk and tone. No joint deformity or effusion appreciated. Neurological:  Cranial nerves 2-12 grossly in tact. No meningismus. Moving all 4 extremities equally.   PFT  05/30/16: FVC 2.46 L (51%) FEV1 1.06 L (30%) FEV1/FVC 0.43 FEF 25-75 0.3 L (14%) positive bronchodilator response 06/03/15: FVC 3.07 L (63%) FEV1 1.20 L (33%) FEV1/FVC 0.39 FEF 25-75 0.47 L (17%) negative bronchodilator response 09/14/14: FVC 2.50 L (51%) FEV1 1.15 L (32%) FEV1/FVC 0.46 FEF 25-75 0.33 L (12%) positive bronchodilator response TLC 9.10 L (120%) RV 236% DLCO uncorrected 36%  02/13/17:  Walked 207 meters / Baseline Sat 100% on RA / Nadir Sat 98% on  RA 11/23/16:  Only able to complete 1 lap w/ lowest saturation 97% 05/30/16:  Walked 399 meters / Baseline Sat 97% on RA / Nadir Sat 95% on RA 06/03/15:  Walked 336 meters / Baseline Sat  97% on RA / Nadir Sat 97% on RA  OVERNIGHT PULSE OX 03/09/15:Performed on RA. 9:12:32 analyzed. Spent 10:32 with saturation </= 88%. 08/18/14: Performed on room air & 7 hours 56 minutes analyzed. Lowest saturation 83%. 14.3 minutes with saturation </= 88%. 73 desaturations events. Lowest pulse 56 bpm.  IMAGING  CTA CHEST 05/01/17 (personally reviewed by me):  Calcified granuloma within left upper lobe. Apical predominant centrilobular and paraseptal emphysema without focal consolidation. Left lower lobe 4 mm nodule previously measuring 7 mm and right lower lobe nodule which appears as new measuring 7 mm. No pleural effusion or thickening. No pericardial effusion. No pulmonary embolism. No pathologic mediastinal adenopathy.  CT CHEST W/O 02/01/17 (previously reviewed by me):  Bilateral nodules with nodular opacities within right upper lobe likely distorted by underlying apical predominant emphysema. Difficult to discern whether or not these nodules are progressive. No pleural effusion or thickening. No pathologic mediastinal adenopathy. No pericardial effusion.  CTA CHEST 09/22/16 (previously reviewed by me):  No pulmonary embolism. Apical predominant emphysematous changes noted. Right upper lobe nodule measuring up to 1.2 cm with some groundglass component on my review and spiculation likely due to distortion from emphysema. No other nodules appreciated. No pleural effusion or thickening. No pericardial effusion. No pathologic mediastinal adenopathy.  CT CHEST W/O 01/26/16 (previously reviewed by me): Moderate to severe upper lobe predominant emphysematous changes. Saber-sheath trachea. No consolidation. Calcified nodules bilaterally. Upper lobe predominant nodules are essentially unchanged with largest measuring 1.2 x  0.5 cm and right upper lobe. Cavitary 0.9 cm peripheral right upper lobe nodule unchanged going back to 2016. No pathologic mediastinal adenopathy. No pleural effusion.  CT CHEST W/O 10/20/15 (previously reviewed by me): 1.1 x 0.5 cm spiculated nodule right upper lobe distorted by surrounding emphysema. Adjacent 9 mm nodule as well. No new developing pulmonary nodules. Calcified nodules again noted as well as small amount of calcification with mediastinal lymph nodes. Apical predominant emphysematous changes. No pleural effusion or thickening. No pericardial effusion. No pathologic mediastinal adenopathy.  CT CHEST W/O 01/19/15 (previously reviewed by me): No pleural effusion or thickening appreciated. Scattered bilateral subcentimeter groundglass pulmonary nodules. Additional calcified pulmonary nodules consistent with evidence of prior granulomatous disease given mediastinal lymph node with calcification & calcification within spleen. Radiologist commented on 1.2 x 1.2 irregular groundglass focus in the right upper lobe which is significantly distorted by severe upper lobe predominant emphysema. No pathologic mediastinal adenopathy. Radiologist commented that there are no bruits subcentimeter nodules that are new. Indeed these nodules were not present on my review of CT imaging from 2005 & 2006. The first time these were found was March 2016.  LABS 03/04/15 Alpha-1 antitrypsin:  MM (205) IgE:  21 RAST Panel:  Aspergillus Fumgitagus 0.36 (class 1) / Alternaria alternata 0.27 (class 0/1) / helminthosporium halodes 0.30 (class 0/1)  08/08/12 CBC: 6.5/15.0/42.5/171 BMP: 139/3.6/99/29/9/0.89/144/9.3   Assessment & Plan:  71 y.o. male with underlying very severe COPD with emphysema. Seems to be recovering well from his recent exacerbation. We reviewed his chest CT scan which does show a new right lower lobe nodule. This will need to be followed up given his history of tobacco use. Overall he seems to be  tolerating his nebulizer regimen well and it seems to be more effective than inhalers. I instructed the patient to contact my office if he had questions or concerns before his next appointment.  1. Very severe COPD with emphysema: Continuing patient on Pulmicort and Perforomist twice a day. Ordering Atrovent nebulized  4 times a day. Patient to resume pulmonary rehabilitation in 2-3 weeks. 2. Right lower lobe nodule: Repeat CT chest without contrast in February. 3. Chronic seasonal allergic rhinitis: Patient continuing on Nasacort, saline nasal spray, and Singulair. No changes. 4. Tobacco use disorder: Did not discuss in front of wife today. 5. Chronic hypoxic respiratory failure: Continuing on oxygen as previously prescribed with portable concentrator. 6. Health maintenance: Status post Prevnar vaccine December 2016 & Pneumovax 2018. Had Flu Vaccine this Fall.  7. Follow-up: Return to clinic in 3 months or sooner if needed.  Donna Christen Jamison Neighbor, M.D. [facility] Pulmonary & Critical Care Pager:  [phone] After 3pm or if no response, call [phone] 3:52 PM 05/17/17

## 2017-05-17 NOTE — Patient Instructions (Addendum)
   Don't forget to call to restart Pulmonary Rehab so we can build up your endurance/stamina.  I'm sending in a prescription to your pharmacy for Atrovent to use in your nebulizer to replace your Spiriva. Use this every day - ideally every 6 hours but at least 3 times daily.  If you don't continue to improve with your breathing give the office a call because we may need to give you a longer Prednisone taper.  Call us if you have any questions or concerns.   TESTS ORDERED: 1. CT CHEST W/O February 2019

## 2017-05-18 ENCOUNTER — Other Ambulatory Visit: Payer: Self-pay | Admitting: Pulmonary Disease

## 2017-05-18 DIAGNOSIS — J439 Emphysema, unspecified: Secondary | ICD-10-CM

## 2017-05-25 ENCOUNTER — Encounter: Payer: Self-pay | Admitting: Nurse Practitioner

## 2017-06-13 ENCOUNTER — Ambulatory Visit: Payer: Medicare Other | Admitting: Nurse Practitioner

## 2017-06-13 NOTE — Progress Notes (Deleted)
CARDIOLOGY OFFICE NOTE  Date:  06/13/2017    John Marsh Date of Birth: [birth year] Medical Record #[id number]  PCP:  Eartha InchBadger, Michael C, MD  Cardiologist:  Tyrone SageGerhardt & ***    No chief complaint on file.   History of Present Illness: John HeckHugh T Marsh is a 71 y.o. male who presents today for a ***   Comes in today. Here with   Past Medical History:  Diagnosis Date  . COPD (chronic obstructive pulmonary disease) (HCC)   . Coronary heart disease   . Emphysema of lung (HCC)   . Hyperlipidemia   . Hypertension   . MI (myocardial infarction) (HCC)   . Multiple lung nodules on CT   . Nocturnal hypoxia   . Peripheral arterial disease (HCC)   . Shortness of breath dyspnea     Past Surgical History:  Procedure Laterality Date  . CHOLECYSTECTOMY    . NASAL SEPTUM SURGERY    . PERIPHERAL VASCULAR CATHETERIZATION N/A 07/27/2015   Procedure: Abdominal Aortogram;  Surgeon: Chuck Hinthristopher S Dickson, MD;  [facility];  Service: Cardiovascular;  Laterality: N/A;  . PERIPHERAL VASCULAR CATHETERIZATION Bilateral 07/27/2015   Procedure: Lower Extremity Angiography;  Surgeon: Chuck Hinthristopher S Dickson, MD;  [facility];  Service: Cardiovascular;  Laterality: Bilateral;  . stent placed       Medications: No outpatient medications have been marked as taking for the 06/13/17 encounter (Appointment) with Rosalio MacadamiaGerhardt, Yaiza Palazzola C, NP.     Allergies: No Known Allergies  Social History: The patient  reports that he has been smoking cigarettes.  He started smoking about 57 years ago. He has a 27.50 pack-year smoking history. he has never used smokeless tobacco. He reports that he does not drink alcohol or use drugs.   Family History: The patient's ***family history includes Heart disease in his brother, father, mother, and sister.   Review of Systems: Please see the history of present illness.   Otherwise, the review of systems is positive for {NONE  DEFAULTED:18576::"none"}.   All other systems are reviewed and negative.   Physical Exam: VS:  There were no vitals taken for this visit. Marland Kitchen.  BMI There is no height or weight on file to calculate BMI.  Wt Readings from Last 3 Encounters:  05/17/17 168 lb 8 oz (76.4 kg)  05/09/17 176 lb 6.4 oz (80 kg)  05/06/17 179 lb 3.2 oz (81.3 kg)    General: Pleasant. Well developed, well nourished and in no acute distress.   HEENT: Normal.  Neck: Supple, no JVD, carotid bruits, or masses noted.  Cardiac: ***Regular rate and rhythm. No murmurs, rubs, or gallops. No edema.  Respiratory:  Lungs are clear to auscultation bilaterally with normal work of breathing.  GI: Soft and nontender.  MS: No deformity or atrophy. Gait and ROM intact.  Skin: Warm and dry. Color is normal.  Neuro:  Strength and sensation are intact and no gross focal deficits noted.  Psych: Alert, appropriate and with normal affect.   LABORATORY DATA:  EKG:  EKG {ACTION; IS/IS [id number]}OT:[id number]} ordered today. This demonstrates ***.  Lab Results  Component Value Date   WBC 12.9 (H) 05/08/2017   HGB 8.1 (L) 05/08/2017   HCT 28.1 (L) 05/08/2017   PLT 208 05/08/2017   GLUCOSE 262 (H) 05/08/2017   ALT 78 (H) 05/08/2017   AST 48 (H) 05/08/2017   NA 137 05/08/2017   K 4.0 05/08/2017   CL 100 (L) 05/08/2017  CREATININE 1.00 05/08/2017   BUN 29 (H) 05/08/2017   CO2 31 05/08/2017   INR 1.02 05/01/2017   HGBA1C 7.9 (H) 05/08/2017     BNP (last 3 results) Recent Labs    05/02/17 1850  BNP 481.7*    ProBNP (last 3 results) No results for input(s): PROBNP in the last 8760 hours.   Other Studies Reviewed Today:   Assessment/Plan:   Current medicines are reviewed with the patient today.  The patient does not have concerns regarding medicines other than what has been noted above.  The following changes have been made:  See above.  Labs/ tests ordered today include:   No orders of the defined types were placed in  this encounter.    Disposition:   FU with *** in {gen number 0-98:[id number]}0-10:310397} {Days to years:10300}.   Patient is agreeable to this plan and will call if any problems develop in the interim.   SignedNorma Fredrickson: Yaakov Saindon, NP  06/13/2017 7:29 AM  [facility] [address] [place], [place]  [address] Phone: [phone]) [phone] Fax: [phone]

## 2017-06-22 ENCOUNTER — Telehealth: Payer: Self-pay | Admitting: Acute Care

## 2017-06-22 DIAGNOSIS — J432 Centrilobular emphysema: Secondary | ICD-10-CM

## 2017-06-22 NOTE — Telephone Encounter (Signed)
I have scheduled this one for  07/24/17 @ 12:45pm lmam for patient

## 2017-06-22 NOTE — Telephone Encounter (Signed)
CT order that was due previously placed by JN.  SG gave verbal OK to place new CT order under her name.  Order placed.  Routing back to Bakerhillhan to follow up on.  [facility] if this patient is eligible for lung screening.

## 2017-06-26 NOTE — Telephone Encounter (Signed)
Spoke with pt regarding lung cancer screening.  Pt does meet the qualifications.  Pt is having a CT Chest per Dr Jamison NeighborNestor on 07/24/17 and is following up with Kandice RobinsonsSarah Groce , NP on 07/25/17.  Will discuss lung cancer screening at that visit.  Pt verbalized understanding.  Nothing further needed.

## 2017-07-14 ENCOUNTER — Other Ambulatory Visit: Payer: Self-pay

## 2017-07-14 ENCOUNTER — Inpatient Hospital Stay (HOSPITAL_COMMUNITY)
Admission: EM | Admit: 2017-07-14 | Discharge: 2017-07-19 | DRG: 191 | Disposition: A | Discharge status: Home Health | Payer: Medicare Other | Attending: Internal Medicine | Admitting: Internal Medicine

## 2017-07-14 ENCOUNTER — Emergency Department (HOSPITAL_COMMUNITY): Payer: Medicare Other

## 2017-07-14 ENCOUNTER — Encounter (HOSPITAL_COMMUNITY): Payer: Self-pay | Admitting: Internal Medicine

## 2017-07-14 DIAGNOSIS — I739 Peripheral vascular disease, unspecified: Secondary | ICD-10-CM | POA: Diagnosis present

## 2017-07-14 DIAGNOSIS — I10 Essential (primary) hypertension: Secondary | ICD-10-CM | POA: Diagnosis present

## 2017-07-14 DIAGNOSIS — K219 Gastro-esophageal reflux disease without esophagitis: Secondary | ICD-10-CM | POA: Diagnosis present

## 2017-07-14 DIAGNOSIS — Z9981 Dependence on supplemental oxygen: Secondary | ICD-10-CM

## 2017-07-14 DIAGNOSIS — R079 Chest pain, unspecified: Secondary | ICD-10-CM | POA: Diagnosis not present

## 2017-07-14 DIAGNOSIS — R911 Solitary pulmonary nodule: Secondary | ICD-10-CM | POA: Diagnosis present

## 2017-07-14 DIAGNOSIS — J441 Chronic obstructive pulmonary disease with (acute) exacerbation: Secondary | ICD-10-CM | POA: Diagnosis present

## 2017-07-14 DIAGNOSIS — Z7901 Long term (current) use of anticoagulants: Secondary | ICD-10-CM

## 2017-07-14 DIAGNOSIS — I2583 Coronary atherosclerosis due to lipid rich plaque: Secondary | ICD-10-CM | POA: Diagnosis present

## 2017-07-14 DIAGNOSIS — R634 Abnormal weight loss: Secondary | ICD-10-CM | POA: Diagnosis present

## 2017-07-14 DIAGNOSIS — Z7951 Long term (current) use of inhaled steroids: Secondary | ICD-10-CM

## 2017-07-14 DIAGNOSIS — D509 Iron deficiency anemia, unspecified: Secondary | ICD-10-CM | POA: Diagnosis present

## 2017-07-14 DIAGNOSIS — I2510 Atherosclerotic heart disease of native coronary artery without angina pectoris: Secondary | ICD-10-CM | POA: Diagnosis present

## 2017-07-14 DIAGNOSIS — Z955 Presence of coronary angioplasty implant and graft: Secondary | ICD-10-CM

## 2017-07-14 DIAGNOSIS — D649 Anemia, unspecified: Secondary | ICD-10-CM

## 2017-07-14 DIAGNOSIS — R0600 Dyspnea, unspecified: Secondary | ICD-10-CM | POA: Diagnosis present

## 2017-07-14 DIAGNOSIS — E785 Hyperlipidemia, unspecified: Secondary | ICD-10-CM | POA: Diagnosis present

## 2017-07-14 DIAGNOSIS — F17210 Nicotine dependence, cigarettes, uncomplicated: Secondary | ICD-10-CM | POA: Diagnosis present

## 2017-07-14 DIAGNOSIS — I480 Paroxysmal atrial fibrillation: Secondary | ICD-10-CM | POA: Diagnosis present

## 2017-07-14 DIAGNOSIS — N400 Enlarged prostate without lower urinary tract symptoms: Secondary | ICD-10-CM | POA: Diagnosis present

## 2017-07-14 DIAGNOSIS — I252 Old myocardial infarction: Secondary | ICD-10-CM

## 2017-07-14 DIAGNOSIS — Z9049 Acquired absence of other specified parts of digestive tract: Secondary | ICD-10-CM

## 2017-07-14 DIAGNOSIS — R933 Abnormal findings on diagnostic imaging of other parts of digestive tract: Secondary | ICD-10-CM

## 2017-07-14 DIAGNOSIS — I4892 Unspecified atrial flutter: Secondary | ICD-10-CM | POA: Diagnosis present

## 2017-07-14 DIAGNOSIS — J439 Emphysema, unspecified: Principal | ICD-10-CM | POA: Diagnosis present

## 2017-07-14 DIAGNOSIS — R0602 Shortness of breath: Secondary | ICD-10-CM

## 2017-07-14 DIAGNOSIS — E1151 Type 2 diabetes mellitus with diabetic peripheral angiopathy without gangrene: Secondary | ICD-10-CM | POA: Diagnosis present

## 2017-07-14 DIAGNOSIS — J9610 Chronic respiratory failure, unspecified whether with hypoxia or hypercapnia: Secondary | ICD-10-CM | POA: Diagnosis present

## 2017-07-14 DIAGNOSIS — Z79891 Long term (current) use of opiate analgesic: Secondary | ICD-10-CM

## 2017-07-14 DIAGNOSIS — E119 Type 2 diabetes mellitus without complications: Secondary | ICD-10-CM | POA: Diagnosis not present

## 2017-07-14 DIAGNOSIS — Z6821 Body mass index (BMI) 21.0-21.9, adult: Secondary | ICD-10-CM

## 2017-07-14 HISTORY — DX: Unspecified atrial fibrillation: I48.91

## 2017-07-14 HISTORY — DX: Iron deficiency anemia, unspecified: D50.9

## 2017-07-14 HISTORY — DX: Anemia, unspecified: D64.9

## 2017-07-14 LAB — COMPREHENSIVE METABOLIC PANEL
ALT: 15 U/L — AB (ref 17–63)
AST: 20 U/L (ref 15–41)
Albumin: 4 g/dL (ref 3.5–5.0)
Alkaline Phosphatase: 61 U/L (ref 38–126)
Anion gap: 10 (ref 5–15)
BILIRUBIN TOTAL: 0.2 mg/dL — AB (ref 0.3–1.2)
BUN: 18 mg/dL (ref 6–20)
CALCIUM: 8.8 mg/dL — AB (ref 8.9–10.3)
CO2: 27 mmol/L (ref 22–32)
CREATININE: 0.78 mg/dL (ref 0.61–1.24)
Chloride: 104 mmol/L (ref 101–111)
Glucose, Bld: 203 mg/dL — ABNORMAL HIGH (ref 65–99)
Potassium: 3.7 mmol/L (ref 3.5–5.1)
Sodium: 141 mmol/L (ref 135–145)
TOTAL PROTEIN: 6.9 g/dL (ref 6.5–8.1)

## 2017-07-14 LAB — PROTIME-INR
INR: 1.06
PROTHROMBIN TIME: 13.7 s (ref 11.4–15.2)

## 2017-07-14 LAB — CBC WITH DIFFERENTIAL/PLATELET
Basophils Absolute: 0 10*3/uL (ref 0.0–0.1)
Basophils Relative: 0 %
Eosinophils Absolute: 0.1 10*3/uL (ref 0.0–0.7)
Eosinophils Relative: 1 %
HEMATOCRIT: 29 % — AB (ref 39.0–52.0)
Hemoglobin: 8.6 g/dL — ABNORMAL LOW (ref 13.0–17.0)
Lymphocytes Relative: 8 %
Lymphs Abs: 0.7 10*3/uL (ref 0.7–4.0)
MCH: 22.3 pg — AB (ref 26.0–34.0)
MCHC: 29.7 g/dL — ABNORMAL LOW (ref 30.0–36.0)
MCV: 75.3 fL — AB (ref 78.0–100.0)
MONO ABS: 0.3 10*3/uL (ref 0.1–1.0)
MONOS PCT: 3 %
NEUTROS ABS: 8.4 10*3/uL — AB (ref 1.7–7.7)
NEUTROS PCT: 88 %
Platelets: 265 10*3/uL (ref 150–400)
RBC: 3.85 MIL/uL — AB (ref 4.22–5.81)
RDW: 19.5 % — ABNORMAL HIGH (ref 11.5–15.5)
WBC: 9.4 10*3/uL (ref 4.0–10.5)

## 2017-07-14 LAB — I-STAT TROPONIN, ED: Troponin i, poc: 0 ng/mL (ref 0.00–0.08)

## 2017-07-14 LAB — LIPASE, BLOOD: LIPASE: 41 U/L (ref 11–51)

## 2017-07-14 LAB — TROPONIN I

## 2017-07-14 LAB — GLUCOSE, CAPILLARY: Glucose-Capillary: 265 mg/dL — ABNORMAL HIGH (ref 65–99)

## 2017-07-14 LAB — D-DIMER, QUANTITATIVE: D-Dimer, Quant: 0.3 ug/mL-FEU (ref 0.00–0.50)

## 2017-07-14 MED ORDER — MONTELUKAST SODIUM 5 MG PO CHEW
5.0000 mg | CHEWABLE_TABLET | Freq: Every day | ORAL | Status: DC
  Administered 2017-07-14 – 2017-07-18 (×5): 5 mg via ORAL
  Filled 2017-07-14 (×5): qty 1

## 2017-07-14 MED ORDER — BENZONATATE 100 MG PO CAPS
100.0000 mg | ORAL_CAPSULE | Freq: Three times a day (TID) | ORAL | Status: DC
  Administered 2017-07-14 – 2017-07-19 (×14): 100 mg via ORAL
  Filled 2017-07-14 (×14): qty 1

## 2017-07-14 MED ORDER — INSULIN ASPART 100 UNIT/ML ~~LOC~~ SOLN
0.0000 [IU] | SUBCUTANEOUS | Status: DC
  Administered 2017-07-14 – 2017-07-15 (×2): 5 [IU] via SUBCUTANEOUS
  Administered 2017-07-15: 7 [IU] via SUBCUTANEOUS

## 2017-07-14 MED ORDER — ROSUVASTATIN CALCIUM 20 MG PO TABS
20.0000 mg | ORAL_TABLET | Freq: Every day | ORAL | Status: DC
  Administered 2017-07-14 – 2017-07-18 (×5): 20 mg via ORAL
  Filled 2017-07-14 (×5): qty 1

## 2017-07-14 MED ORDER — ACETAMINOPHEN 650 MG RE SUPP: 650.0000 mg | Freq: Four times a day (QID) | RECTAL | Status: DC | PRN

## 2017-07-14 MED ORDER — SODIUM CHLORIDE 0.9 % IV SOLN
INTRAVENOUS | Status: AC
  Administered 2017-07-14: 23:00:00 via INTRAVENOUS

## 2017-07-14 MED ORDER — PRAMIPEXOLE DIHYDROCHLORIDE 0.125 MG PO TABS
0.1250 mg | ORAL_TABLET | Freq: Every day | ORAL | Status: DC
  Administered 2017-07-14: 0.125 mg via ORAL
  Filled 2017-07-14 (×2): qty 1

## 2017-07-14 MED ORDER — ARFORMOTEROL TARTRATE 15 MCG/2ML IN NEBU
15.0000 ug | INHALATION_SOLUTION | Freq: Two times a day (BID) | RESPIRATORY_TRACT | Status: DC
  Administered 2017-07-14 – 2017-07-16 (×4): 15 ug via RESPIRATORY_TRACT
  Filled 2017-07-14 (×4): qty 2

## 2017-07-14 MED ORDER — SALINE SPRAY 0.65 % NA SOLN
1.0000 | NASAL | Status: DC | PRN
  Administered 2017-07-17: 1 via NASAL
  Filled 2017-07-14: qty 44

## 2017-07-14 MED ORDER — PANTOPRAZOLE SODIUM 40 MG PO TBEC
40.0000 mg | DELAYED_RELEASE_TABLET | Freq: Every day | ORAL | Status: DC
  Administered 2017-07-15 – 2017-07-19 (×5): 40 mg via ORAL
  Filled 2017-07-14 (×6): qty 1

## 2017-07-14 MED ORDER — GABAPENTIN 100 MG PO CAPS
100.0000 mg | ORAL_CAPSULE | Freq: Two times a day (BID) | ORAL | Status: DC
  Administered 2017-07-14 – 2017-07-19 (×10): 100 mg via ORAL
  Filled 2017-07-14 (×10): qty 1

## 2017-07-14 MED ORDER — ASPIRIN EC 325 MG PO TBEC
325.0000 mg | DELAYED_RELEASE_TABLET | Freq: Every day | ORAL | Status: AC
  Administered 2017-07-14 – 2017-07-15 (×2): 325 mg via ORAL
  Filled 2017-07-14 (×3): qty 1

## 2017-07-14 MED ORDER — ALBUTEROL SULFATE (2.5 MG/3ML) 0.083% IN NEBU: 2.5000 mg | INHALATION_SOLUTION | Freq: Four times a day (QID) | RESPIRATORY_TRACT | Status: DC | PRN

## 2017-07-14 MED ORDER — DILTIAZEM HCL ER COATED BEADS 180 MG PO CP24
360.0000 mg | ORAL_CAPSULE | Freq: Every day | ORAL | Status: DC
  Administered 2017-07-15 – 2017-07-17 (×3): 360 mg via ORAL
  Filled 2017-07-14 (×4): qty 2

## 2017-07-14 MED ORDER — HYDROCORTISONE 2.5 % RE CREA
TOPICAL_CREAM | Freq: Two times a day (BID) | RECTAL | Status: DC
  Administered 2017-07-17 – 2017-07-18 (×2): via RECTAL
  Filled 2017-07-14: qty 28.35

## 2017-07-14 MED ORDER — ENSURE ENLIVE PO LIQD
237.0000 mL | Freq: Three times a day (TID) | ORAL | Status: DC
  Administered 2017-07-15 – 2017-07-19 (×12): 237 mL via ORAL

## 2017-07-14 MED ORDER — SACCHAROMYCES BOULARDII 250 MG PO CAPS
250.0000 mg | ORAL_CAPSULE | Freq: Two times a day (BID) | ORAL | Status: DC
  Administered 2017-07-14 – 2017-07-19 (×10): 250 mg via ORAL
  Filled 2017-07-14 (×10): qty 1

## 2017-07-14 MED ORDER — ACETAMINOPHEN 325 MG PO TABS
650.0000 mg | ORAL_TABLET | Freq: Four times a day (QID) | ORAL | Status: DC | PRN
  Administered 2017-07-16 – 2017-07-18 (×3): 650 mg via ORAL
  Filled 2017-07-14 (×3): qty 2

## 2017-07-14 MED ORDER — GUAIFENESIN ER 600 MG PO TB12
600.0000 mg | ORAL_TABLET | Freq: Two times a day (BID) | ORAL | Status: DC
  Administered 2017-07-14 – 2017-07-19 (×10): 600 mg via ORAL
  Filled 2017-07-14 (×10): qty 1

## 2017-07-14 MED ORDER — IPRATROPIUM BROMIDE 0.02 % IN SOLN
0.5000 mg | Freq: Four times a day (QID) | RESPIRATORY_TRACT | Status: DC
  Administered 2017-07-14: 0.5 mg via RESPIRATORY_TRACT
  Filled 2017-07-14: qty 2.5

## 2017-07-14 MED ORDER — FLUTICASONE PROPIONATE 50 MCG/ACT NA SUSP
2.0000 | Freq: Every day | NASAL | Status: DC | PRN
  Administered 2017-07-16: 2 via NASAL
  Filled 2017-07-14: qty 16

## 2017-07-14 MED ORDER — APIXABAN 5 MG PO TABS
5.0000 mg | ORAL_TABLET | Freq: Two times a day (BID) | ORAL | Status: DC
  Administered 2017-07-14 – 2017-07-17 (×6): 5 mg via ORAL
  Filled 2017-07-14 (×6): qty 1

## 2017-07-14 MED ORDER — BUDESONIDE 0.5 MG/2ML IN SUSP
0.5000 mg | Freq: Two times a day (BID) | RESPIRATORY_TRACT | Status: DC
  Administered 2017-07-14: 0.5 mg via RESPIRATORY_TRACT
  Filled 2017-07-14: qty 2

## 2017-07-14 NOTE — H&P (Signed)
TRH H&P   Patient Demographics:    John Marsh, is a 72 y.o. male  MRN: [id number]   DOB - [birth year]  Admit Date - 07/14/2017  Outpatient Primary MD for the patient is Eartha InchBadger, Michael C, MD  Referring MD/NP/PA:  Chriss Czarhris Tiegler  Outpatient Specialists:   Patient coming from: home  Chief Complaint  Patient presents with  . Shortness of Breath      HPI:    John Marsh  is a 72 y.o. male, w hypertension, hyperlipidemia, dm2, PVD, Pafib (Chadsvasc2=3), CAD s/p stent, Copd (homeo2) apparently c/o chest pain for the past 1 week, describes as substernal and left sided chest pressure without radiation.  Pt states pain worse last nite.  Pt presented due to persistence of chest discomfort as well as slight dyspnea.  Pt received duoneb x2, and solumedrol 125mg  iv x1.   Pt denies fever, chills, cough, palp, n/v, heartburn, abd pain, diarrhea, brbpr.  Pt presented to ED for evaluation of chest pain  In ED, nsr at 80, nl axis, no st-t changes c/w ischemia  Trop I 0.00  Wbc 9.4, Hgb 8.6, Plt 265 Glucose 203 Bun 18, Creatinine 0.78 Ast 20, Alt 15 Lipase 41 INR 1.06   CXR FINDINGS: Normal heart size, mediastinal contours, and pulmonary vascularity.  Coronary arterial stent noted.  Atherosclerotic calcification aorta.  Emphysematous and minimal bronchitic changes consistent with COPD.  RIGHT apex scarring.  No acute infiltrate, pleural effusion or pneumothorax.  Calcified granuloma LEFT upper lobe.  Bones demineralized.  IMPRESSION: COPD changes with RIGHT upper lobe scarring.  Cardiology consulted in ED, recommended medical admission per ER.  Pt will be admitted for chest pain       Review of systems:    In addition to the HPI above,  No Fever-chills, No Headache, No changes with Vision or hearing, No problems swallowing food or Liquids, No Cough or  Shortness of Breath, No Abdominal pain, No Nausea or Vommitting, Bowel movements are regular, No Blood in stool or Urine, No dysuria, No new skin rashes or bruises, No new joints pains-aches,  No new weakness, tingling, numbness in any extremity, No recent weight gain or loss, No polyuria, polydypsia or polyphagia, No significant Mental Stressors.  A full 10 point Review of Systems was done, except as stated above, all other Review of Systems were negative.   With Past History of the following :    Past Medical History:  Diagnosis Date  . Anemia   . COPD (chronic obstructive pulmonary disease) (HCC)   . Coronary heart disease   . Emphysema of lung (HCC)   . Hyperlipidemia   . Hypertension   . MI (myocardial infarction) (HCC)   . Multiple lung nodules on CT   . Nocturnal hypoxia   . Peripheral arterial disease (HCC)   . Shortness of breath dyspnea       Past  Surgical History:  Procedure Laterality Date  . CHOLECYSTECTOMY    . NASAL SEPTUM SURGERY    . PERIPHERAL VASCULAR CATHETERIZATION N/A 07/27/2015   Procedure: Abdominal Aortogram;  Surgeon: Chuck Hint, MD;  Location: [facility] INVAS[facility] CV LAB;  Service: Cardiovascular;  Laterality: N/A;  . PERIPHERAL VASCULAR CATHETERIZATION Bilateral 07/27/2015   Procedure: Lower Extremity Angiography;  Surgeon: Chuck Hint, MD;  Location: [facility] INVASIVE CV LAB;  Service: Cardiovascular;  Laterality: Bilateral;  . stent placed        Social History:     Social History   Tobacco Use  . Smoking status: Current Some Day Smoker    Packs/day: 0.50    Years: 55.00    Pack years: 27.50    Types: Cigarettes    Start date: 08/01/1959  . Smokeless tobacco: Never Used  . Tobacco comment: 1/2 ppd 06/03/15 - peak 3ppd   taking Chantix currently 07-20-2015  Substance Use Topics  . Alcohol use: No    Alcohol/week: 0.0 oz     Lives - at home  Mobility - walks by self   Family History :     Family History  Problem  Relation Age of Onset  . Heart disease Mother        died at 4  . Heart disease Father   . Heart disease Brother   . Heart disease Sister   . Lung disease Neg Hx       Home Medications:   Prior to Admission medications   Medication Sig Start Date End Date Taking? Authorizing Provider  acetaminophen (TYLENOL) 325 MG tablet Take 650 mg by mouth every 6 (six) hours as needed for mild pain.   Yes [provider]  albuterol (PROVENTIL HFA;VENTOLIN HFA) 108 (90 Base) MCG/ACT inhaler Inhale 2 puffs into the lungs every 6 (six) hours as needed for wheezing or shortness of breath. 02/05/17   Roslynn Amble, MD  apixaban (ELIQUIS) 5 MG TABS tablet Take 1 tablet (5 mg total) 2 (two) times daily by mouth. 05/06/17   Rolly Salter, MD  benzonatate (TESSALON) 100 MG capsule Take 1 capsule (100 mg total) 3 (three) times daily by mouth. 05/06/17   Rolly Salter, MD  budesonide (PULMICORT) 0.5 MG/2ML nebulizer solution Take 2 mLs (0.5 mg total) by nebulization 2 (two) times daily. 03/07/17   Roslynn Amble, MD  diltiazem (CARDIZEM CD) 360 MG 24 hr capsule Take 1 capsule (360 mg total) daily by mouth. 05/07/17   Rolly Salter, MD  feeding supplement, ENSURE ENLIVE, (ENSURE ENLIVE) LIQD Take 237 mLs by mouth 3 (three) times daily between meals. 09/26/16   Narda Bonds, MD  fluticasone (FLONASE) 50 MCG/ACT nasal spray Place 2 sprays into both nostrils daily as needed for allergies or rhinitis. 09/26/16   Narda Bonds, MD  formoterol (PERFOROMIST) 20 MCG/2ML nebulizer solution Take 2 mLs (20 mcg total) by nebulization 2 (two) times daily. 02/13/17   Roslynn Amble, MD  gabapentin (NEURONTIN) 100 MG capsule Take 1 capsule (100 mg total) 2 (two) times daily by mouth. 05/06/17   Rolly Salter, MD  guaiFENesin (MUCINEX) 600 MG 12 hr tablet Take 1 tablet (600 mg total) by mouth 2 (two) times daily. 09/26/16   Narda Bonds, MD  hydrocortisone (ANUSOL-HC) 2.5 % rectal cream Place  rectally 2 (two) times daily. 05/09/17   Vassie Loll, MD  ipratropium (ATROVENT) 0.02 % nebulizer solution Take 2.5 mLs (0.5 mg total) by nebulization 4 (  four) times daily. 05/17/17   Roslynn Amble, MD  levalbuterol (XOPENEX) 1.25 MG/0.5ML nebulizer solution Take 1.25 mg every 6 (six) hours as needed by nebulization for wheezing or shortness of breath. 05/06/17   Rolly Salter, MD  menthol-cetylpyridinium (CEPACOL) 3 MG lozenge Take 1 lozenge (3 mg total) as needed by mouth for sore throat. 05/06/17   Rolly Salter, MD  montelukast (SINGULAIR) 5 MG chewable tablet CHEW 1 TABLET BY MOUTH AT BEDTIME 02/01/16   Roslynn Amble, MD  nicotine (NICODERM CQ - DOSED IN MG/24 HOURS) 21 mg/24hr patch Place 1 patch (21 mg total) onto the skin daily. 05/09/17   Vassie Loll, MD  nitroGLYCERIN (NITROSTAT) 0.4 MG SL tablet Place 0.4 mg under the tongue every 5 (five) minutes as needed for chest pain. x3 doses as needed for chest pain    [provider]  pantoprazole (PROTONIX) 40 MG tablet Take 1 tablet (40 mg total) by mouth daily. 07/01/16   Erick Blinks, MD  pramipexole (MIRAPEX) 0.125 MG tablet Take 1 tablet by mouth at bedtime. 06/21/16   [provider]  predniSONE (DELTASONE) 10 MG tablet Take 60mg  daily for 3 days,Take 40mg  daily for 3days,Take 30mg  daily for 3days,Take 20mg  daily for 3days,Take 10mg  daily for 3days, then stop. 05/09/17   Vassie Loll, MD  rosuvastatin (CRESTOR) 20 MG tablet Take 20 mg by mouth at bedtime.    [provider]  saccharomyces boulardii (FLORASTOR) 250 MG capsule Take 1 capsule (250 mg total) by mouth 2 (two) times daily. 05/09/17   Vassie Loll, MD  sodium chloride (OCEAN) 0.65 % SOLN nasal spray Place 1 spray as needed into both nostrils for congestion (nose irritation). 05/06/17   Rolly Salter, MD  Spacer/Aero-Holding Chambers (AEROCHAMBER Z-STAT PLUS) inhaler Use as instructed 03/04/15   Roslynn Amble, MD  triamcinolone  (NASACORT ALLERGY 24HR) 55 MCG/ACT AERO nasal inhaler Place 2 sprays daily as needed into the nose (allergies).     [provider]     Allergies:    No Known Allergies   Physical Exam:   Vitals  Blood pressure (!) 130/47, pulse 82, temperature 98.9 F (37.2 C), temperature source Oral, resp. rate 17, height 5\' 10"  (1.778 m), weight 76.2 kg (168 lb), SpO2 100 %.   1. General  lying in bed in NAD,    2. Normal affect and insight, Not Suicidal or Homicidal, Awake Alert, Oriented X 3.  3. No F.N deficits, ALL C.Nerves Intact, Strength 5/5 all 4 extremities, Sensation intact all 4 extremities, Plantars down going.  4. Ears and Eyes appear Normal, Conjunctivae clear, PERRLA. Moist Oral Mucosa.  5. Supple Neck, No JVD, No cervical lymphadenopathy appriciated, No Carotid Bruits.  6. Symmetrical Chest wall movement, Good air movement bilaterally, CTAB.  7. RRR, No Gallops, Rubs or Murmurs, No Parasternal Heave.  8. Positive Bowel Sounds, Abdomen Soft, No tenderness, No organomegaly appriciated,No rebound -guarding or rigidity.  9.  No Cyanosis, Normal Skin Turgor, No Skin Rash or Bruise.  10. Good muscle tone,  joints appear normal , no effusions, Normal ROM.  11. No Palpable Lymph Nodes in Neck or Axillae     Data Review:    CBC Recent Labs  Lab 07/14/17 1859  WBC 9.4  HGB 8.6*  HCT 29.0*  PLT 265  MCV 75.3*  MCH 22.3*  MCHC 29.7*  RDW 19.5*  LYMPHSABS 0.7  MONOABS 0.3  EOSABS 0.1  BASOSABS 0.0   ------------------------------------------------------------------------------------------------------------------  Chemistries  Recent Labs  Lab 07/14/17 1859  NA 141  K 3.7  CL 104  CO2 27  GLUCOSE 203*  BUN 18  CREATININE 0.78  CALCIUM 8.8*  AST 20  ALT 15*  ALKPHOS 61  BILITOT 0.2*   ------------------------------------------------------------------------------------------------------------------ estimated creatinine clearance is 86.2 mL/min  (by C-G formula based on SCr of 0.78 mg/dL). ------------------------------------------------------------------------------------------------------------------ No results for input(s): TSH, T4TOTAL, T3FREE, THYROIDAB in the last 72 hours.  Invalid input(s): FREET3  Coagulation profile Recent Labs  Lab 07/14/17 1859  INR 1.06   ------------------------------------------------------------------------------------------------------------------- No results for input(s): DDIMER in the last 72 hours. -------------------------------------------------------------------------------------------------------------------  Cardiac Enzymes No results for input(s): CKMB, TROPONINI, MYOGLOBIN in the last 168 hours.  Invalid input(s): CK ------------------------------------------------------------------------------------------------------------------    Component Value Date/Time   BNP 481.7 (H) 05/02/2017 1850     ---------------------------------------------------------------------------------------------------------------  Urinalysis    Component Value Date/Time   COLORURINE YELLOW 05/02/2017 1427   APPEARANCEUR CLEAR 05/02/2017 1427   LABSPEC 1.010 05/02/2017 1427   PHURINE 6.5 05/02/2017 1427   GLUCOSEU 100 (A) 05/02/2017 1427   HGBUR MODERATE (A) 05/02/2017 1427   BILIRUBINUR NEGATIVE 05/02/2017 1427   KETONESUR NEGATIVE 05/02/2017 1427   PROTEINUR NEGATIVE 05/02/2017 1427   UROBILINOGEN 0.2 08/08/2012 1910   NITRITE NEGATIVE 05/02/2017 1427   LEUKOCYTESUR NEGATIVE 05/02/2017 1427    ----------------------------------------------------------------------------------------------------------------   Imaging Results:    Dg Chest 2 View  Result Date: 07/14/2017 CLINICAL DATA:  Chest pain and shortness of breath tonight, history coronary artery disease post MI, COPD, CHF, hypertension, smoker EXAM: CHEST  2 VIEW COMPARISON:  05/06/2017 FINDINGS: Normal heart size, mediastinal  contours, and pulmonary vascularity. Coronary arterial stent noted. Atherosclerotic calcification aorta. Emphysematous and minimal bronchitic changes consistent with COPD. RIGHT apex scarring. No acute infiltrate, pleural effusion or pneumothorax. Calcified granuloma LEFT upper lobe. Bones demineralized. IMPRESSION: COPD changes with RIGHT upper lobe scarring. No acute abnormalities. Electronically Signed   By: Ulyses Southward M.D.   On: 07/14/2017 19:12       Assessment & Plan:    Principal Problem:   Chest pain Active Problems:   Diabetes mellitus without complication (HCC)   Anemia    Chest pain Tele Trop I q6h x3 Cardiac echo NPO after [facility] consult,  Defer to cardiology regarding further w/up  CAD s/p stent, PVD Aspirin  Cont Crestor Cont Cardizem  Dm2 (bs >200) Check hga1c  fsbs q4h , ISS  Anemia, iron deficiency Check cbc in am  Copd Cont singulair Cont pulmicort Cont xopenex=> albuterol neb Cont atrovent neb Cont formoterol neb bid  Lung nodule Will need outpatient follow up    DVT Prophylaxis,  Eliquis,  SCDs   AM Labs Ordered, also please review Full Orders  Family Communication: Admission, patients condition and plan of care including tests being ordered have been discussed with the patient  who indicate understanding and agree with the plan and Code Status.  Code Status FULL CODE  Likely DC to  home  Condition GUARDED   Consults called: cardiology by ED, cardiology consult in am  Admission status: observation  Time spent in minutes : 45   Pearson Grippe M.D on 07/14/2017 at 8:38 PM  Between 7am to 7pm - Pager - [phone]. After 7pm go to [email or web address] - password [facility]  [facility] - Office  [phone]

## 2017-07-14 NOTE — ED Provider Notes (Signed)
[facility]-EMERGENCY DEPT Provider Note   CSN: [id number] Arrival date & time: 07/14/17  1811     History   Chief Complaint Chief Complaint  Patient presents with  . Shortness of Breath    HPI John Marsh is a 72 y.o. male.  The history is provided by the patient, medical records and a significant other.  Chest Pain   This is a new problem. The current episode started yesterday. The problem occurs daily. The problem has been resolved. The pain is associated with exertion. The pain is present in the substernal region. The pain is at a severity of 5/10. The pain is moderate. The quality of the pain is described as exertional, pleuritic and pressure-like. The pain does not radiate. Duration of episode(s) is 2 hours. The symptoms are aggravated by exertion. Associated symptoms include exertional chest pressure and near-syncope. Pertinent negatives include no abdominal pain, no back pain, no cough, no diaphoresis, no dizziness, no fever, no headaches, no malaise/fatigue, no nausea, no palpitations, no shortness of breath, no sputum production and no vomiting. He has tried rest and nitroglycerin for the symptoms. The treatment provided significant relief. Risk factors include male gender.  His past medical history is significant for CAD and MI.  Pertinent negatives for past medical history include no seizures.    Past Medical History:  Diagnosis Date  . COPD (chronic obstructive pulmonary disease) (HCC)   . Coronary heart disease   . Emphysema of lung (HCC)   . Hyperlipidemia   . Hypertension   . MI (myocardial infarction) (HCC)   . Multiple lung nodules on CT   . Nocturnal hypoxia   . Peripheral arterial disease (HCC)   . Shortness of breath dyspnea     Patient Active Problem List   Diagnosis Date Noted  . Chronic diastolic HF (heart failure) (HCC)   . Bronchiectasis with acute exacerbation (HCC)   . COPD exacerbation (HCC) 05/07/2017  . Leukocytosis  05/07/2017  . COPD (chronic obstructive pulmonary disease) (HCC) 05/07/2017  . Typical atrial flutter (HCC)   . Respiratory failure (HCC) 05/02/2017  . Lobar pneumonia (HCC) 05/01/2017  . Anemia 05/01/2017  . Protein-calorie malnutrition, severe 09/21/2016  . Elevated troponin 09/19/2016  . Acute on chronic respiratory failure with hypoxia (HCC) 09/19/2016  . Chest pain 06/30/2016  . PVD (peripheral vascular disease) with claudication (HCC) 07/27/2015  . COPD with acute exacerbation (HCC) 07/03/2015  . Tachycardia 07/03/2015  . Chronic respiratory failure with hypoxia (HCC) 07/03/2015  . Dyspnea 06/03/2015  . Allergic rhinitis 06/03/2015  . Emphysema lung (HCC) 03/04/2015  . Nocturnal hypoxia 03/04/2015  . Pulmonary nodules 08/10/2014  . HYPERTRIGLYCERIDEMIA 08/16/2006  . HLD (hyperlipidemia) 08/16/2006  . TOBACCO DEPENDENCE 08/16/2006  . Essential hypertension 08/16/2006  . CAD (coronary artery disease), native coronary artery 08/16/2006  . COPD (chronic obstructive pulmonary disease) with emphysema (HCC) 08/16/2006    Past Surgical History:  Procedure Laterality Date  . CHOLECYSTECTOMY    . NASAL SEPTUM SURGERY    . PERIPHERAL VASCULAR CATHETERIZATION N/A 07/27/2015   Procedure: Abdominal Aortogram;  Surgeon: Chuck Hinthristopher S Dickson, MD;  [facility];  Service: Cardiovascular;  Laterality: N/A;  . PERIPHERAL VASCULAR CATHETERIZATION Bilateral 07/27/2015   Procedure: Lower Extremity Angiography;  Surgeon: Chuck Hinthristopher S Dickson, MD;  [facility];  Service: Cardiovascular;  Laterality: Bilateral;  . stent placed         Home Medications    Prior to Admission medications  Medication Sig Start Date End Date Taking? Authorizing Provider  albuterol (PROVENTIL HFA;VENTOLIN HFA) 108 (90 Base) MCG/ACT inhaler Inhale 2 puffs into the lungs every 6 (six) hours as needed for wheezing or shortness of breath. 02/05/17   Roslynn Amble, MD  apixaban  (ELIQUIS) 5 MG TABS tablet Take 1 tablet (5 mg total) 2 (two) times daily by mouth. 05/06/17   Rolly Salter, MD  benzonatate (TESSALON) 100 MG capsule Take 1 capsule (100 mg total) 3 (three) times daily by mouth. 05/06/17   Rolly Salter, MD  budesonide (PULMICORT) 0.5 MG/2ML nebulizer solution Take 2 mLs (0.5 mg total) by nebulization 2 (two) times daily. 03/07/17   Roslynn Amble, MD  diltiazem (CARDIZEM CD) 360 MG 24 hr capsule Take 1 capsule (360 mg total) daily by mouth. 05/07/17   Rolly Salter, MD  feeding supplement, ENSURE ENLIVE, (ENSURE ENLIVE) LIQD Take 237 mLs by mouth 3 (three) times daily between meals. 09/26/16   Narda Bonds, MD  fluticasone (FLONASE) 50 MCG/ACT nasal spray Place 2 sprays into both nostrils daily as needed for allergies or rhinitis. 09/26/16   Narda Bonds, MD  formoterol (PERFOROMIST) 20 MCG/2ML nebulizer solution Take 2 mLs (20 mcg total) by nebulization 2 (two) times daily. 02/13/17   Roslynn Amble, MD  gabapentin (NEURONTIN) 100 MG capsule Take 1 capsule (100 mg total) 2 (two) times daily by mouth. 05/06/17   Rolly Salter, MD  guaiFENesin (MUCINEX) 600 MG 12 hr tablet Take 1 tablet (600 mg total) by mouth 2 (two) times daily. 09/26/16   Narda Bonds, MD  hydrocortisone (ANUSOL-HC) 2.5 % rectal cream Place rectally 2 (two) times daily. 05/09/17   Vassie Loll, MD  ipratropium (ATROVENT) 0.02 % nebulizer solution Take 2.5 mLs (0.5 mg total) by nebulization 4 (four) times daily. 05/17/17   Roslynn Amble, MD  levalbuterol (XOPENEX) 1.25 MG/0.5ML nebulizer solution Take 1.25 mg every 6 (six) hours as needed by nebulization for wheezing or shortness of breath. 05/06/17   Rolly Salter, MD  menthol-cetylpyridinium (CEPACOL) 3 MG lozenge Take 1 lozenge (3 mg total) as needed by mouth for sore throat. 05/06/17   Rolly Salter, MD  montelukast (SINGULAIR) 5 MG chewable tablet CHEW 1 TABLET BY MOUTH AT BEDTIME 02/01/16   Roslynn Amble, MD    nicotine (NICODERM CQ - DOSED IN MG/24 HOURS) 21 mg/24hr patch Place 1 patch (21 mg total) onto the skin daily. 05/09/17   Vassie Loll, MD  nitroGLYCERIN (NITROSTAT) 0.4 MG SL tablet Place 0.4 mg under the tongue every 5 (five) minutes as needed for chest pain. x3 doses as needed for chest pain    [provider]  pantoprazole (PROTONIX) 40 MG tablet Take 1 tablet (40 mg total) by mouth daily. 07/01/16   Erick Blinks, MD  pramipexole (MIRAPEX) 0.125 MG tablet Take 1 tablet by mouth at bedtime. 06/21/16   [provider]  predniSONE (DELTASONE) 10 MG tablet Take 60mg  daily for 3 days,Take 40mg  daily for 3days,Take 30mg  daily for 3days,Take 20mg  daily for 3days,Take 10mg  daily for 3days, then stop. 05/09/17   Vassie Loll, MD  rosuvastatin (CRESTOR) 20 MG tablet Take 20 mg by mouth at bedtime.    [provider]  saccharomyces boulardii (FLORASTOR) 250 MG capsule Take 1 capsule (250 mg total) by mouth 2 (two) times daily. 05/09/17   Vassie Loll, MD  sodium chloride (OCEAN) 0.65 % SOLN nasal spray Place 1 spray as needed  into both nostrils for congestion (nose irritation). 05/06/17   Rolly Salter, MD  Spacer/Aero-Holding Chambers (AEROCHAMBER Z-STAT PLUS) inhaler Use as instructed 03/04/15   Roslynn Amble, MD  triamcinolone (NASACORT ALLERGY 24HR) 55 MCG/ACT AERO nasal inhaler Place 2 sprays daily as needed into the nose (allergies).     [provider]    Family History Family History  Problem Relation Age of Onset  . Heart disease Mother        died at 42  . Heart disease Father   . Heart disease Brother   . Heart disease Sister   . Lung disease Neg Hx     Social History Social History   Tobacco Use  . Smoking status: Current Some Day Smoker    Packs/day: 0.50    Years: 55.00    Pack years: 27.50    Types: Cigarettes    Start date: 08/01/1959  . Smokeless tobacco: Never Used  . Tobacco comment: 1/2 ppd 06/03/15 - peak 3ppd   taking  Chantix currently 07-20-2015  Substance Use Topics  . Alcohol use: No    Alcohol/week: 0.0 oz  . Drug use: No     Allergies   Patient has no known allergies.   Review of Systems Review of Systems  Constitutional: Negative for chills, diaphoresis, fatigue, fever and malaise/fatigue.  HENT: Negative for congestion.   Respiratory: Negative for cough, sputum production, choking, chest tightness, shortness of breath, wheezing and stridor.   Cardiovascular: Positive for chest pain and near-syncope. Negative for palpitations and leg swelling.  Gastrointestinal: Negative for abdominal pain, nausea and vomiting.  Genitourinary: Negative for enuresis, flank pain and frequency.  Musculoskeletal: Negative for back pain.  Neurological: Positive for light-headedness. Negative for dizziness, seizures, syncope, speech difficulty and headaches.  Psychiatric/Behavioral: Negative for agitation.  All other systems reviewed and are negative.    Physical Exam Updated Vital Signs BP 114/69   Pulse 85   Temp 98.9 F (37.2 C) (Oral)   Resp 15   SpO2 100%   Physical Exam  Constitutional: He is oriented to person, place, and time. He appears well-developed and well-nourished.  Non-toxic appearance. He does not appear ill. No distress.  HENT:  Head: Normocephalic and atraumatic.  Mouth/Throat: Oropharynx is clear and moist. No oropharyngeal exudate.  Eyes: Conjunctivae and EOM are normal. Pupils are equal, round, and reactive to light.  Neck: Normal range of motion.  Cardiovascular: Normal rate and intact distal pulses.  No murmur heard. Pulmonary/Chest: Effort normal. No stridor. No respiratory distress. He has no wheezes. He has no rales. He exhibits no tenderness.  Abdominal: He exhibits no distension. There is no tenderness.  Musculoskeletal: He exhibits no edema or tenderness.  Neurological: He is alert and oriented to person, place, and time. No sensory deficit. He exhibits normal muscle  tone.  Skin: Capillary refill takes less than 2 seconds. He is not diaphoretic. No erythema. No pallor.  Psychiatric: He has a normal mood and affect.  Nursing note and vitals reviewed.    ED Treatments / Results  Labs (all labs ordered are listed, but only abnormal results are displayed) Labs Reviewed  CBC WITH DIFFERENTIAL/PLATELET - Abnormal; Notable for the following components:      Result Value   RBC 3.85 (*)    Hemoglobin 8.6 (*)    HCT 29.0 (*)    MCV 75.3 (*)    MCH 22.3 (*)    MCHC 29.7 (*)    RDW 19.5 (*)  Neutro Abs 8.4 (*)    All other components within normal limits  COMPREHENSIVE METABOLIC PANEL - Abnormal; Notable for the following components:   Glucose, Bld 203 (*)    Calcium 8.8 (*)    ALT 15 (*)    Total Bilirubin 0.2 (*)    All other components within normal limits  GLUCOSE, CAPILLARY - Abnormal; Notable for the following components:   Glucose-Capillary 265 (*)    All other components within normal limits  LIPASE, BLOOD  PROTIME-INR  D-DIMER, QUANTITATIVE (NOT AT [facility])  HEMOGLOBIN A1C  CBC  COMPREHENSIVE METABOLIC PANEL  TROPONIN I  TROPONIN I  TROPONIN I  LIPID PANEL  I-STAT TROPONIN, ED  I-STAT TROPONIN, ED    EKG  EKG Interpretation  Date/Time:  Saturday July 14 2017 18:24:01 EST Ventricular Rate:  83 PR Interval:    QRS Duration: 92 QT Interval:  365 QTC Calculation: 429 R Axis:   67 Text Interpretation:  Sinus rhythm When compared to prior, slower rate.  No STEMI Confirmed by Theda Belfast ([id number]) on 07/14/2017 7:09:05 PM       Radiology Dg Chest 2 View  Result Date: 07/14/2017 CLINICAL DATA:  Chest pain and shortness of breath tonight, history coronary artery disease post MI, COPD, CHF, hypertension, smoker EXAM: CHEST  2 VIEW COMPARISON:  05/06/2017 FINDINGS: Normal heart size, mediastinal contours, and pulmonary vascularity. Coronary arterial stent noted. Atherosclerotic calcification aorta. Emphysematous and minimal  bronchitic changes consistent with COPD. RIGHT apex scarring. No acute infiltrate, pleural effusion or pneumothorax. Calcified granuloma LEFT upper lobe. Bones demineralized. IMPRESSION: COPD changes with RIGHT upper lobe scarring. No acute abnormalities. Electronically Signed   By: Ulyses Southward M.D.   On: 07/14/2017 19:12    Procedures Procedures (including critical care time)  Medications Ordered in ED Medications  apixaban (ELIQUIS) tablet 5 mg (5 mg Oral Given 07/14/17 2318)  benzonatate (TESSALON) capsule 100 mg (100 mg Oral Given 07/14/17 2319)  budesonide (PULMICORT) nebulizer solution 0.5 mg (0.5 mg Nebulization Given 07/14/17 2251)  diltiazem (CARDIZEM CD) 24 hr capsule 360 mg (not administered)  feeding supplement (ENSURE ENLIVE) (ENSURE ENLIVE) liquid 237 mL (not administered)  fluticasone (FLONASE) 50 MCG/ACT nasal spray 2 spray (not administered)  arformoterol (BROVANA) nebulizer solution 15 mcg (15 mcg Nebulization Given 07/14/17 2251)  gabapentin (NEURONTIN) capsule 100 mg (100 mg Oral Given 07/14/17 2319)  guaiFENesin (MUCINEX) 12 hr tablet 600 mg (600 mg Oral Given 07/14/17 2319)  hydrocortisone (ANUSOL-HC) 2.5 % rectal cream ( Rectal Not Given 07/14/17 2321)  ipratropium (ATROVENT) nebulizer solution 0.5 mg (0.5 mg Nebulization Given 07/14/17 2251)  albuterol (PROVENTIL) (2.5 MG/3ML) 0.083% nebulizer solution 2.5 mg (not administered)  montelukast (SINGULAIR) chewable tablet 5 mg (5 mg Oral Given 07/14/17 2318)  pantoprazole (PROTONIX) EC tablet 40 mg (not administered)  pramipexole (MIRAPEX) tablet 0.125 mg (0.125 mg Oral Given 07/14/17 2318)  rosuvastatin (CRESTOR) tablet 20 mg (20 mg Oral Given 07/14/17 2318)  saccharomyces boulardii (FLORASTOR) capsule 250 mg (250 mg Oral Given 07/14/17 2318)  sodium chloride (OCEAN) 0.65 % nasal spray 1 spray (not administered)  0.9 %  sodium chloride infusion ( Intravenous New Bag/Given 07/14/17 2319)  acetaminophen (TYLENOL) tablet 650 mg (not  administered)    Or  acetaminophen (TYLENOL) suppository 650 mg (not administered)  insulin aspart (novoLOG) injection 0-9 Units (5 Units Subcutaneous Given 07/14/17 2319)  aspirin EC tablet 325 mg (325 mg Oral Given 07/14/17 2318)     Initial Impression / Assessment and Plan / ED  Course  I have reviewed the triage vital signs and the nursing notes.  Pertinent labs & imaging results that were available during my care of the patient were reviewed by me and considered in my medical decision making (see chart for details).     John Marsh is a 72 y.o. male with a past medical history significant for CAD with MI status post PCI according to patient, COPD on home oxygen, CHF, intermittent atrial fibrillation, hypertension, hyperlipidemia, COPD, and peripheral arterial disease who presents with chest pain and shortness of breath.  Patient reports that his chest pain began yesterday afternoon as he was watching TV.  He reports it has been intermittent and pressure-like.  He describes it as aching but nonradiating.  He says it feels similar in quality to prior MI but not as severe.  He reports the worst as a 5 out of 10 in severity.  He reports that he took 3 nitroglycerin last night with improvement in the pain.  He then went to sleep.  He reports that today he has had continued intermittent episodes of the chest pain that also resolved with the nitroglycerin.  He describes feeling lightheaded and near syncopal when he tries to ambulate.  He reports that the pain is exertional and better with rest.  He denies any recent trauma.  He denies fevers, chills, congestion, diaphoresis, nausea, vomiting.  He does report a chronic dry cough.  No abdominal symptoms, conservation, diarrhea, or dysuria.  He denies any lower extremity edema.  He denies history of DVT or PE.  On exam, lungs were clear.  Chest was nontender.  His pain was nonreproducible.  Abdomen nontender.  Legs were not edematous and nonswollen.   Patient had symmetric pulses in upper extremities.  EKG showed no STEMI.  Initial troponin negative.  Chest x-ray showed COPD with scarring but no new pneumonia.  Laboratory testing revealed a similar anemia to prior with no leukocytosis.  INR normal and troponin negative initially.  Metabolic panel overall reassuring.  Cardiology was called given the concerning chest pain with a history of MI.  They recommended patient be admitted to the hospital service and they will see in consultation.  They recommended addition of a d-dimer and patient will likely need echo and troponin trending.  They felt patient will be appropriate to stay at [facility] long rather than transfer to [facility].  Next  Hospitalist team will be called for admission.    Final Clinical Impressions(s) / ED Diagnoses   Final diagnoses:  SOB (shortness of breath)  Exertional chest pain     Clinical Impression: 1. SOB (shortness of breath)   2. Exertional chest pain     Disposition: Admit  This note was prepared with assistance of Dragon voice recognition software. Occasional wrong-word or sound-a-like substitutions may have occurred due to the inherent limitations of voice recognition software.      Asheton Viramontes, Canary Brim, MD 07/14/17 [phone]

## 2017-07-14 NOTE — ED Notes (Signed)
Attempted to call report, no answer. Phone rang to Diplomatic Services operational officersecretary and then call ended. Will re-attempt

## 2017-07-14 NOTE — ED Triage Notes (Signed)
Per EMS, pt comes from home. Hx copd and resp failure. More sob today than usual. Used inhalers all day without relief. EMS gave patient duoneb x2-total 10 albuterol and 1 atrovent, pt reports mild improvement. Received 125 solumedrol. On 3L oxygen at home. Alert and oriented.

## 2017-07-14 NOTE — ED Notes (Signed)
Bed: [id number] Expected date:  Expected time:  Means of arrival:  Comments: 72 yo SOB

## 2017-07-15 ENCOUNTER — Observation Stay (HOSPITAL_BASED_OUTPATIENT_CLINIC_OR_DEPARTMENT_OTHER): Payer: Medicare Other

## 2017-07-15 DIAGNOSIS — I739 Peripheral vascular disease, unspecified: Secondary | ICD-10-CM | POA: Diagnosis present

## 2017-07-15 DIAGNOSIS — D509 Iron deficiency anemia, unspecified: Secondary | ICD-10-CM | POA: Diagnosis not present

## 2017-07-15 DIAGNOSIS — R630 Anorexia: Secondary | ICD-10-CM | POA: Diagnosis not present

## 2017-07-15 DIAGNOSIS — R079 Chest pain, unspecified: Secondary | ICD-10-CM

## 2017-07-15 DIAGNOSIS — N400 Enlarged prostate without lower urinary tract symptoms: Secondary | ICD-10-CM | POA: Diagnosis present

## 2017-07-15 DIAGNOSIS — R933 Abnormal findings on diagnostic imaging of other parts of digestive tract: Secondary | ICD-10-CM | POA: Diagnosis not present

## 2017-07-15 DIAGNOSIS — R911 Solitary pulmonary nodule: Secondary | ICD-10-CM | POA: Diagnosis present

## 2017-07-15 DIAGNOSIS — K219 Gastro-esophageal reflux disease without esophagitis: Secondary | ICD-10-CM | POA: Diagnosis present

## 2017-07-15 DIAGNOSIS — I252 Old myocardial infarction: Secondary | ICD-10-CM | POA: Diagnosis not present

## 2017-07-15 DIAGNOSIS — Z791 Long term (current) use of non-steroidal anti-inflammatories (NSAID): Secondary | ICD-10-CM | POA: Diagnosis not present

## 2017-07-15 DIAGNOSIS — Z955 Presence of coronary angioplasty implant and graft: Secondary | ICD-10-CM | POA: Diagnosis not present

## 2017-07-15 DIAGNOSIS — Z7901 Long term (current) use of anticoagulants: Secondary | ICD-10-CM | POA: Diagnosis not present

## 2017-07-15 DIAGNOSIS — R0600 Dyspnea, unspecified: Secondary | ICD-10-CM | POA: Diagnosis present

## 2017-07-15 DIAGNOSIS — I2510 Atherosclerotic heart disease of native coronary artery without angina pectoris: Secondary | ICD-10-CM | POA: Diagnosis not present

## 2017-07-15 DIAGNOSIS — E1151 Type 2 diabetes mellitus with diabetic peripheral angiopathy without gangrene: Secondary | ICD-10-CM | POA: Diagnosis present

## 2017-07-15 DIAGNOSIS — J441 Chronic obstructive pulmonary disease with (acute) exacerbation: Secondary | ICD-10-CM

## 2017-07-15 DIAGNOSIS — J9610 Chronic respiratory failure, unspecified whether with hypoxia or hypercapnia: Secondary | ICD-10-CM | POA: Diagnosis present

## 2017-07-15 DIAGNOSIS — Z6821 Body mass index (BMI) 21.0-21.9, adult: Secondary | ICD-10-CM | POA: Diagnosis not present

## 2017-07-15 DIAGNOSIS — I4892 Unspecified atrial flutter: Secondary | ICD-10-CM | POA: Diagnosis present

## 2017-07-15 DIAGNOSIS — E119 Type 2 diabetes mellitus without complications: Secondary | ICD-10-CM | POA: Diagnosis not present

## 2017-07-15 DIAGNOSIS — I10 Essential (primary) hypertension: Secondary | ICD-10-CM | POA: Diagnosis present

## 2017-07-15 DIAGNOSIS — R0789 Other chest pain: Secondary | ICD-10-CM | POA: Diagnosis not present

## 2017-07-15 DIAGNOSIS — R0602 Shortness of breath: Secondary | ICD-10-CM

## 2017-07-15 DIAGNOSIS — J439 Emphysema, unspecified: Secondary | ICD-10-CM | POA: Diagnosis present

## 2017-07-15 DIAGNOSIS — E785 Hyperlipidemia, unspecified: Secondary | ICD-10-CM | POA: Diagnosis present

## 2017-07-15 DIAGNOSIS — I2583 Coronary atherosclerosis due to lipid rich plaque: Secondary | ICD-10-CM

## 2017-07-15 DIAGNOSIS — I480 Paroxysmal atrial fibrillation: Secondary | ICD-10-CM | POA: Diagnosis present

## 2017-07-15 DIAGNOSIS — Z7951 Long term (current) use of inhaled steroids: Secondary | ICD-10-CM | POA: Diagnosis not present

## 2017-07-15 DIAGNOSIS — Z9981 Dependence on supplemental oxygen: Secondary | ICD-10-CM | POA: Diagnosis not present

## 2017-07-15 DIAGNOSIS — R634 Abnormal weight loss: Secondary | ICD-10-CM | POA: Diagnosis not present

## 2017-07-15 DIAGNOSIS — Z9049 Acquired absence of other specified parts of digestive tract: Secondary | ICD-10-CM | POA: Diagnosis not present

## 2017-07-15 DIAGNOSIS — F17210 Nicotine dependence, cigarettes, uncomplicated: Secondary | ICD-10-CM | POA: Diagnosis present

## 2017-07-15 HISTORY — DX: Dyspnea, unspecified: R06.00

## 2017-07-15 LAB — CBC
HEMATOCRIT: 26.1 % — AB (ref 39.0–52.0)
Hemoglobin: 7.9 g/dL — ABNORMAL LOW (ref 13.0–17.0)
MCH: 22.6 pg — ABNORMAL LOW (ref 26.0–34.0)
MCHC: 30.3 g/dL (ref 30.0–36.0)
MCV: 74.6 fL — AB (ref 78.0–100.0)
Platelets: 250 10*3/uL (ref 150–400)
RBC: 3.5 MIL/uL — ABNORMAL LOW (ref 4.22–5.81)
RDW: 19.7 % — ABNORMAL HIGH (ref 11.5–15.5)
WBC: 7.8 10*3/uL (ref 4.0–10.5)

## 2017-07-15 LAB — GLUCOSE, CAPILLARY
GLUCOSE-CAPILLARY: 326 mg/dL — AB (ref 65–99)
GLUCOSE-CAPILLARY: 163 mg/dL — AB (ref 65–99)
GLUCOSE-CAPILLARY: 242 mg/dL — AB (ref 65–99)
Glucose-Capillary: 253 mg/dL — ABNORMAL HIGH (ref 65–99)
Glucose-Capillary: 218 mg/dL — ABNORMAL HIGH (ref 65–99)
Glucose-Capillary: 218 mg/dL — ABNORMAL HIGH (ref 65–99)

## 2017-07-15 LAB — COMPREHENSIVE METABOLIC PANEL
ALT: 13 U/L — ABNORMAL LOW (ref 17–63)
ANION GAP: 8 (ref 5–15)
AST: 19 U/L (ref 15–41)
Albumin: 3.3 g/dL — ABNORMAL LOW (ref 3.5–5.0)
Alkaline Phosphatase: 53 U/L (ref 38–126)
BUN: 20 mg/dL (ref 6–20)
CALCIUM: 8.7 mg/dL — AB (ref 8.9–10.3)
CO2: 26 mmol/L (ref 22–32)
Chloride: 106 mmol/L (ref 101–111)
Creatinine, Ser: 0.75 mg/dL (ref 0.61–1.24)
GFR calc Af Amer: >60 mL/min (ref >60)
Glucose, Bld: 243 mg/dL — ABNORMAL HIGH (ref 65–99)
POTASSIUM: 3.8 mmol/L (ref 3.5–5.1)
Sodium: 140 mmol/L (ref 135–145)
TOTAL PROTEIN: 6.1 g/dL — AB (ref 6.5–8.1)

## 2017-07-15 LAB — LIPID PANEL
Cholesterol: 119 mg/dL (ref 0–200)
HDL: 52 mg/dL (ref >40)
LDL Cholesterol: 59 mg/dL (ref 0–99)
TRIGLYCERIDES: 39 mg/dL (ref <150)
Total CHOL/HDL Ratio: 2.3 RATIO
VLDL: 8 mg/dL (ref 0–40)

## 2017-07-15 LAB — HEMOGLOBIN A1C
HEMOGLOBIN A1C: 8.2 % — AB (ref 4.8–5.6)
Mean Plasma Glucose: 188.64 mg/dL

## 2017-07-15 LAB — TROPONIN I: Troponin I: <0.03 ng/mL (ref <0.03)

## 2017-07-15 LAB — ECHOCARDIOGRAM COMPLETE
HEIGHTINCHES: 72 in
Weight: 2582.03 oz

## 2017-07-15 MED ORDER — INSULIN ASPART 100 UNIT/ML ~~LOC~~ SOLN
0.0000 [IU] | SUBCUTANEOUS | Status: DC
  Administered 2017-07-15: 3 [IU] via SUBCUTANEOUS

## 2017-07-15 MED ORDER — PRAMIPEXOLE DIHYDROCHLORIDE 0.125 MG PO TABS
0.1250 mg | ORAL_TABLET | Freq: Every day | ORAL | Status: DC
  Administered 2017-07-15 – 2017-07-18 (×4): 0.125 mg via ORAL
  Filled 2017-07-15 (×4): qty 1

## 2017-07-15 MED ORDER — PREDNISONE 20 MG PO TABS
40.0000 mg | ORAL_TABLET | Freq: Every day | ORAL | Status: AC
  Administered 2017-07-15 – 2017-07-19 (×5): 40 mg via ORAL
  Filled 2017-07-15 (×6): qty 2

## 2017-07-15 MED ORDER — IPRATROPIUM BROMIDE 0.02 % IN SOLN
0.5000 mg | Freq: Four times a day (QID) | RESPIRATORY_TRACT | Status: DC
  Administered 2017-07-15 – 2017-07-16 (×5): 0.5 mg via RESPIRATORY_TRACT
  Filled 2017-07-15 (×5): qty 2.5

## 2017-07-15 MED ORDER — INSULIN ASPART 100 UNIT/ML ~~LOC~~ SOLN
0.0000 [IU] | Freq: Three times a day (TID) | SUBCUTANEOUS | Status: DC
  Administered 2017-07-15: 8 [IU] via SUBCUTANEOUS
  Administered 2017-07-15 – 2017-07-16 (×3): 2 [IU] via SUBCUTANEOUS
  Administered 2017-07-16: 3 [IU] via SUBCUTANEOUS
  Administered 2017-07-17: 2 [IU] via SUBCUTANEOUS
  Administered 2017-07-17: 3 [IU] via SUBCUTANEOUS
  Administered 2017-07-17: 1 [IU] via SUBCUTANEOUS
  Administered 2017-07-18: 3 [IU] via SUBCUTANEOUS
  Administered 2017-07-18: 1 [IU] via SUBCUTANEOUS
  Administered 2017-07-18: 3 [IU] via SUBCUTANEOUS
  Administered 2017-07-19: 2 [IU] via SUBCUTANEOUS

## 2017-07-15 MED ORDER — BUDESONIDE 0.5 MG/2ML IN SUSP
0.5000 mg | Freq: Two times a day (BID) | RESPIRATORY_TRACT | Status: DC
  Administered 2017-07-15 – 2017-07-16 (×4): 0.5 mg via RESPIRATORY_TRACT
  Filled 2017-07-15 (×3): qty 2

## 2017-07-15 MED ORDER — PRAMIPEXOLE DIHYDROCHLORIDE 0.125 MG PO TABS: 0.1250 mg | ORAL_TABLET | Freq: Every evening | ORAL | Status: DC

## 2017-07-15 NOTE — Plan of Care (Signed)
  Progressing Health Behavior/Discharge Planning: Ability to manage health-related needs will improve 07/15/2017 1002 - Progressing by Gwendlyn DeutscherStevens, Aria Jarrard E, RN Clinical Measurements: Ability to maintain clinical measurements within normal limits will improve 07/15/2017 1002 - Progressing by Gwendlyn DeutscherStevens, Rithvik Orcutt E, RN Will remain free from infection 07/15/2017 1002 - Progressing by Gwendlyn DeutscherStevens, Paris Hohn E, RN Diagnostic test results will improve 07/15/2017 1002 - Progressing by Gwendlyn DeutscherStevens, Tajuana Kniskern E, RN Respiratory complications will improve 07/15/2017 1002 - Progressing by Gwendlyn DeutscherStevens, Shadae Reino E, RN Note Pt reports breathing improved since admission.  Cardiovascular complication will be avoided 07/15/2017 1002 - Progressing by Gwendlyn DeutscherStevens, Andrell Tallman E, RN Note Denies CP at present. Seen by cardiology. Await echo.  Activity: Risk for activity intolerance will decrease 07/15/2017 1002 - Progressing by Gwendlyn DeutscherStevens, Myalee Stengel E, RN Nutrition: Adequate nutrition will be maintained 07/15/2017 1002 - Progressing by Gwendlyn DeutscherStevens, Samier Jaco E, RN Note Presently NPO. Await diet orders.  Pain Managment: General experience of comfort will improve 07/15/2017 1002 - Progressing by Gwendlyn DeutscherStevens, Carole Deere E, RN Safety: Ability to remain free from injury will improve 07/15/2017 1002 - Progressing by Gwendlyn DeutscherStevens, Hamed Debella E, RN

## 2017-07-15 NOTE — Consult Note (Signed)
Admit date: 07/14/2017 Referring Physician  Dr. Caleb Popp Primary Physician  Dr. Antony Haste Primary Cardiologist  Dr. Verdis Prime Reason for Consultation  Chest pain and SOB  HPI: John Marsh is a 72 y.o. male who is being seen today for the evaluation of chest pain and SOB at the request of Dr. Caleb Popp.  This is a 72yo male with a history of COPD, chronic respiratory failure on 3L home oxygen, HTN, HLD, GERD, PVD, CAD, tobacco use and PAF.  He was recently in the hospital with increased SOB was CP in setting of acute PNA, COPD exacerbation and sepsis.  During that admission he developed atrial fibrillation with RVR and was started on IV Cardizem gtt and transitioned to Cardizem CD 360mg  daily and started on Eliquis 5mg  BID for a CHADS2VASC score of 3.  He is scheduled to see Norma Fredrickson NP in our office soon.    He presented to [facility] ER yesterday with complaints of SOB. He says that he has had SOB for the past year which had actually gotten better but over the last 4 weeks it had worsened to the point he would get SOB with minimal exertion and was associated with chest tightness.  There is no radiation of the chest discomfort and no associated N/diaphoresis.  In the ER he received duoneb x 2 and IV solumedrol.  He was noted to be anemic with a Hbg of 7.9.  Trop negative x 2.  EKG showed NSR with no ST changes.  Currently pain free.    PMH:   Past Medical History:  Diagnosis Date  . Anemia   . Atrial fibrillation (HCC)    Chadsvasc2=3  . COPD (chronic obstructive pulmonary disease) (HCC)   . Coronary heart disease   . Emphysema of lung (HCC)   . Hyperlipidemia   . Hypertension   . Iron deficiency anemia   . MI (myocardial infarction) (HCC)   . Multiple lung nodules on CT   . Nocturnal hypoxia   . Peripheral arterial disease (HCC)   . Shortness of breath dyspnea      PSH:   Past Surgical History:  Procedure Laterality Date  . CHOLECYSTECTOMY    . NASAL SEPTUM SURGERY    .  PERIPHERAL VASCULAR CATHETERIZATION N/A 07/27/2015   Procedure: Abdominal Aortogram;  Surgeon: Chuck Hint, MD;  Location: [facility] INVASIVE CV LAB;  Service: Cardiovascular;  Laterality: N/A;  . PERIPHERAL VASCULAR CATHETERIZATION Bilateral 07/27/2015   Procedure: Lower Extremity Angiography;  Surgeon: Chuck Hint, MD;  Location: [facility] INVASIVE CV LAB;  Service: Cardiovascular;  Laterality: Bilateral;  . stent placed      Allergies:  Patient has no known allergies. Prior to Admit Meds:   Medications Prior to Admission  Medication Sig Dispense Refill Last Dose  . acetaminophen (TYLENOL) 325 MG tablet Take 650 mg by mouth every 6 (six) hours as needed for mild pain.   Past Month at Unknown time  . albuterol (PROVENTIL HFA;VENTOLIN HFA) 108 (90 Base) MCG/ACT inhaler Inhale 2 puffs into the lungs every 6 (six) hours as needed for wheezing or shortness of breath. 1 Inhaler 6 07/14/2017 at Unknown time  . apixaban (ELIQUIS) 5 MG TABS tablet Take 1 tablet (5 mg total) 2 (two) times daily by mouth. 60 tablet 0 07/14/2017 at 8am  . ATROVENT HFA 17 MCG/ACT inhaler Take 2 puffs by mouth 4 (four) times daily.  5 07/14/2017 at Unknown time  . benzonatate (TESSALON) 100 MG capsule Take  1 capsule (100 mg total) 3 (three) times daily by mouth. 20 capsule 0 07/14/2017 at Unknown time  . budesonide (PULMICORT) 0.5 MG/2ML nebulizer solution Take 2 mLs (0.5 mg total) by nebulization 2 (two) times daily. 60 mL 3 Past Week at Unknown time  . diltiazem (CARDIZEM CD) 360 MG 24 hr capsule Take 1 capsule (360 mg total) daily by mouth. 60 capsule 0 07/14/2017 at Unknown time  . feeding supplement, ENSURE ENLIVE, (ENSURE ENLIVE) LIQD Take 237 mLs by mouth 3 (three) times daily between meals. 30 Bottle 0 Past Month at Unknown time  . fluticasone (FLONASE) 50 MCG/ACT nasal spray Place 2 sprays into both nostrils daily as needed for allergies or rhinitis. 16 g 0 07/14/2017 at Unknown time  . formoterol (PERFOROMIST) 20  MCG/2ML nebulizer solution Take 2 mLs (20 mcg total) by nebulization 2 (two) times daily. 60 mL 3 07/13/2017 at Unknown time  . gabapentin (NEURONTIN) 100 MG capsule Take 1 capsule (100 mg total) 2 (two) times daily by mouth. 60 capsule 0 07/14/2017 at Unknown time  . guaiFENesin (MUCINEX) 600 MG 12 hr tablet Take 1 tablet (600 mg total) by mouth 2 (two) times daily. 30 tablet 0 07/14/2017 at Unknown time  . hydrocortisone (ANUSOL-HC) 2.5 % rectal cream Place rectally 2 (two) times daily. 30 g 0 Past Month at Unknown time  . ipratropium (ATROVENT) 0.02 % nebulizer solution Take 2.5 mLs (0.5 mg total) by nebulization 4 (four) times daily. 120 mL 3 Past Week at Unknown time  . menthol-cetylpyridinium (CEPACOL) 3 MG lozenge Take 1 lozenge (3 mg total) as needed by mouth for sore throat. 100 tablet 12 Past Month at Unknown time  . montelukast (SINGULAIR) 5 MG chewable tablet CHEW 1 TABLET BY MOUTH AT BEDTIME 30 tablet 3 07/13/2017 at Unknown time  . nitroGLYCERIN (NITROSTAT) 0.4 MG SL tablet Place 0.4 mg under the tongue every 5 (five) minutes as needed for chest pain. x3 doses as needed for chest pain   07/13/2017 at Unknown time  . pantoprazole (PROTONIX) 40 MG tablet Take 1 tablet (40 mg total) by mouth daily. 30 tablet 0 07/14/2017 at Unknown time  . pramipexole (MIRAPEX) 0.125 MG tablet Take 1 tablet by mouth at bedtime.   07/14/2017 at Unknown time  . predniSONE (DELTASONE) 10 MG tablet Take 60mg  daily for 3 days,Take 40mg  daily for 3days,Take 30mg  daily for 3days,Take 20mg  daily for 3days,Take 10mg  daily for 3days, then stop. 40 tablet 0 unknown  . rosuvastatin (CRESTOR) 20 MG tablet Take 20 mg by mouth at bedtime.   07/13/2017 at Unknown time  . saccharomyces boulardii (FLORASTOR) 250 MG capsule Take 1 capsule (250 mg total) by mouth 2 (two) times daily. 30 capsule 0 Past Month at Unknown time  . sodium chloride (OCEAN) 0.65 % SOLN nasal spray Place 1 spray as needed into both nostrils for congestion (nose  irritation). 2 Bottle 0 07/14/2017 at Unknown time  . Spacer/Aero-Holding Chambers (AEROCHAMBER Z-STAT PLUS) inhaler Use as instructed 1 each 0 Past Month at Unknown time  . tamsulosin (FLOMAX) 0.4 MG CAPS capsule Take 0.4 mg by mouth.   07/13/2017 at Unknown time  . triamcinolone (NASACORT ALLERGY 24HR) 55 MCG/ACT AERO nasal inhaler Place 2 sprays daily as needed into the nose (allergies).    07/14/2017 at Unknown time  . levalbuterol (XOPENEX) 1.25 MG/0.5ML nebulizer solution Take 1.25 mg every 6 (six) hours as needed by nebulization for wheezing or shortness of breath. (Patient not taking: Reported on 07/14/2017) 30  each 12 Not Taking at Unknown time   Fam HX:    Family History  Problem Relation Age of Onset  . Heart disease Mother        died at 57  . Heart disease Father   . Heart disease Brother   . Heart disease Sister   . Lung disease Neg Hx    Social HX:    Social History   Socioeconomic History  . Marital status: Married    Spouse name: Not on file  . Number of children: Not on file  . Years of education: Not on file  . Highest education level: Not on file  Social Needs  . Financial resource strain: Not on file  . Food insecurity - worry: Not on file  . Food insecurity - inability: Not on file  . Transportation needs - medical: Not on file  . Transportation needs - non-medical: Not on file  Occupational History  . Occupation: retired  Tobacco Use  . Smoking status: Current Some Day Smoker    Packs/day: 0.50    Years: 55.00    Pack years: 27.50    Types: Cigarettes    Start date: 08/01/1959  . Smokeless tobacco: Never Used  . Tobacco comment: 1/2 ppd 06/03/15 - peak 3ppd   taking Chantix currently 07-20-2015  Substance and Sexual Activity  . Alcohol use: No    Alcohol/week: 0.0 oz  . Drug use: No  . Sexual activity: No  Other Topics Concern  . Not on file  Social History Narrative   Originally from [place]. Previously has lived in [place]. He moved to [facility] in [place]. He serve  in [place]. 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 He has been to [place], [place], [place], several countries in [place], countries in [place], [place], & multiple [place]ries. 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 He has a dog, 7 cats, & 2 horses. Remote exposure to a parrot for a few years in a different home. No mold exposure. No hot tub exposure. 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      ROS:  All  ROS were addressed and are negative except what is stated in the HPI  Physical Exam: Blood pressure 122/60, pulse 81, temperature 98.2 F (36.8 C), temperature source Oral, resp. rate 20, height 6' (1.829 m), weight 161 lb 6 oz (73.2 kg), SpO2 100 %.    General: Well developed, well nourished, in no acute distress Head: Eyes PERRLA, No xanthomas.   Normal cephalic and atramatic  Lungs:   Clear bilaterally to auscultation and percussion. Heart:   HRRR S1 S2 Pulses are 2+ & equal.            No carotid bruit. No JVD.  No abdominal bruits. No femoral bruits. Abdomen: Bowel sounds are positive, abdomen soft and non-tender without masses or                  Hernia's noted. Msk:  Back normal, normal gait. Normal strength and tone for age. Extremities:   No clubbing, cyanosis or edema.  DP +1 Neuro: Alert and oriented X 3. Psych:  Good affect, responds appropriately    Labs:   Lab Results  Component Value Date   WBC 7.8 07/15/2017   HGB 7.9 (L) 07/15/2017   HCT 26.1 (L) 07/15/2017   MCV 74.6 (L) 07/15/2017  PLT 250 07/15/2017    Recent Labs  Lab 07/15/17 0505  NA 140  K 3.8  CL 106  CO2 26  BUN 20  CREATININE 0.75  CALCIUM 8.7*  PROT 6.1*  BILITOT <0.1*  ALKPHOS 53  ALT 13*  AST 19  GLUCOSE 243*   No results found for: PTT Lab Results  Component Value Date   INR 1.06 07/14/2017   INR 1.02 05/01/2017   Lab Results  Component Value Date    TROPONINI <0.03 07/15/2017     Lab Results  Component Value Date   CHOL 119 07/15/2017   Lab Results  Component Value Date   HDL 52 07/15/2017   Lab Results  Component Value Date   LDLCALC 59 07/15/2017   Lab Results  Component Value Date   TRIG 39 07/15/2017   Lab Results  Component Value Date   CHOLHDL 2.3 07/15/2017   No results found for: LDLDIRECT    Radiology:  Dg Chest 2 View  Result Date: 07/14/2017 CLINICAL DATA:  Chest pain and shortness of breath tonight, history coronary artery disease post MI, COPD, CHF, hypertension, smoker EXAM: CHEST  2 VIEW COMPARISON:  05/06/2017 FINDINGS: Normal heart size, mediastinal contours, and pulmonary vascularity. Coronary arterial stent noted. Atherosclerotic calcification aorta. Emphysematous and minimal bronchitic changes consistent with COPD. RIGHT apex scarring. No acute infiltrate, pleural effusion or pneumothorax. Calcified granuloma LEFT upper lobe. Bones demineralized. IMPRESSION: COPD changes with RIGHT upper lobe scarring. No acute abnormalities. Electronically Signed   By: Ulyses Southward M.D.   On: 07/14/2017 19:12     Telemetry    NSR - Personally Reviewed  ECG    NSR with no ST changes - Personally Reviewed   ASSESSMENT/PLAN:    1.  Chest pain associated with DOE.  DOE has been present for over a year and actually had improved but over the past few weeks has worsened to the point he has it with minimal exertion and associated with chest pressure.  No associated sx of N/diaphoresis and EKG is nonischemic.  Trop negative x 2.  ? COPD exacerbation as he has significant wheezing throughout both lung fields and Cxray showed no CHF. - Rx underlying COPD exacerbation - repeat 2D echo to make sure LVF remains intact.  Echo a year ago showed normal LVF. - once active wheezing has stopped then consider nuclear stress test to rule out ischemia  2.  ASCAD with remote MI (followed by Dr. Juanda Chance years ago) - continue statin -  not on ASA due to NOAC  3.  HTN - BP well controlled - continue Cardizem CD 360mg  daily  4.  Paroxysmal atrial fibrillation - he is maintaining NSR - continue Cardizem and Apixaban 5mg  BID  for a CHADS2VASC score of 3.  Armanda Magic, MD  07/15/2017  9:55 AM

## 2017-07-15 NOTE — Progress Notes (Signed)
PROGRESS NOTE  John Marsh:[id number] DOB: July 14, 1945 DOA: 07/14/2017 PCP: John Inch, MD  HPI/Recap of past 24 hours:  John Marsh is a 72 y.o. year old male with medical history significant for COPD on oxygen (2 L rest, 3 w/exertion), Af on xarelto, CAD, GERDPAD, HTN who presented on 07/14/2017 with chest pain.    This morning, denies any chest pain.  Notes improvement in shortness of breath.  Assessment/Plan: Principal Problem:   Chest pain Active Problems:   Diabetes mellitus without complication (HCC)   HLD (hyperlipidemia)   Essential hypertension   Shortness of breath   COPD with acute exacerbation (HCC)   Anemia   Coronary artery disease due to lipid rich plaque     ACS rule out in patient with known cardiac history.  Chest pain had admits typical and atypical (left-sided chest pressure, but occurring at rest, lasting over 2 hours, unclear if improvement with nitro versus breathing treatments).  Currently chest pain-free. EKG wnl, troponin trend negative so far. CXR without acute changes. LDL 59, HDL 52.TTE shows no wall motion abnormalities and preserved EF. -Monitor on tele, trop trend, may need stress test but will defer to cardiology consult  COPD, possible exacerbation. 3 L at rest ( incr from baseline), cough but not producing much.  Wife states patient was noted to have desaturation oxygen when evaluated by EMS at home.  Additionally patient does report there was some improvement in left sided chest pressure and dyspnea with breathing treatments.  (At home).  Will continue home inhaler therapy patient not wheezing currently.  Will add prednisone for the next 5 days.    CAD s/p stent Continue aspirin  Chronic anemia, hgb stable at baseline. Continue to monitor CBC  T2DM, A1c 8.2 ( 07/15/17), continue blood glucose monitoring and sliding scale.  Paroxysmal atrial fibrillation, rate controlled, NSR. Continue home dilt and xarelto  Gerd.  protonix  HLD. crestor  BPH. flomax    Code Status: Full Code  Family Communication: Wife at bedside   Disposition Plan: chest pain rule out, Anticipate discharge in 24 hours    Consultants:  Cardiology  Procedures: TTE 07/15/17:  Study Conclusions  - Left ventricle: The cavity size was normal. Systolic function was   normal. The estimated ejection fraction was in the range of 60%   to 65%. Wall motion was normal; there were no regional wall   motion abnormalities. Left ventricular diastolic function   parameters were normal. - Aortic valve: There was very mild stenosis. Valve area (Vmax):   1.82 cm^2.  Antimicrobials:  None  Cultures:  None  DVT prophylaxis:  On eliquis   Objective: Vitals:   07/15/17 0800 07/15/17 0952 07/15/17 1240 07/15/17 1441  BP:  122/60  (!) 113/46  Pulse:  81  89  Resp:  20  19  Temp:    98 F (36.7 C)  TempSrc:    Oral  SpO2: 99% 100% 100% 100%  Weight:      Height:        Intake/Output Summary (Last 24 hours) at 07/15/2017 1521 Last data filed at 07/15/2017 1400 Gross per 24 hour  Intake 1761.25 ml  Output 1675 ml  Net 86.25 ml   Filed Weights   07/14/17 1830 07/14/17 2227 07/15/17 0500  Weight: 76.2 kg (168 lb) 73.5 kg (162 lb) 73.2 kg (161 lb 6 oz)    Exam:  General: Lying in bed, in no apparent distress Neck: normal, no appreciable JVD  Cardiovascular: regular rate and rhythm, no murmurs, rubs or gallops, no edema Respiratory: Normal respiratory effort, lungs clear to auscultation bilaterally, no rales, wheezes or rhonchi, on 3 L [place] Abdomen: soft, non-distended, non-tender, normal bowel sounds, no guarding or rebound tenderness Skin: No Rash Musculoskeletal:No clubbing / cyanosis. No joint deformity upper and lower extremities. Good ROM, no contractures. Normal muscle tone Neurologic: Grossly no focal neuro deficit.Mental status AAOx3, speech normal, Psychiatric:Appropriate affect, and mood  Data  Reviewed: CBC: Recent Labs  Lab 07/14/17 1859 07/15/17 0505  WBC 9.4 7.8  NEUTROABS 8.4*  --   HGB 8.6* 7.9*  HCT 29.0* 26.1*  MCV 75.3* 74.6*  PLT 265 250   Basic Metabolic Panel: Recent Labs  Lab 07/14/17 1859 07/15/17 0505  NA 141 140  K 3.7 3.8  CL 104 106  CO2 27 26  GLUCOSE 203* 243*  BUN 18 20  CREATININE 0.78 0.75  CALCIUM 8.8* 8.7*   GFR: Estimated Creatinine Clearance: 86.4 mL/min (by C-G formula based on SCr of 0.75 mg/dL). Liver Function Tests: Recent Labs  Lab 07/14/17 1859 07/15/17 0505  AST 20 19  ALT 15* 13*  ALKPHOS 61 53  BILITOT 0.2* <0.1*  PROT 6.9 6.1*  ALBUMIN 4.0 3.3*   Recent Labs  Lab 07/14/17 1859  LIPASE 41   No results for input(s): AMMONIA in the last 168 hours. Coagulation Profile: Recent Labs  Lab 07/14/17 1859  INR 1.06   Cardiac Enzymes: Recent Labs  Lab 07/14/17 2248 07/15/17 0505 07/15/17 1033  TROPONINI <0.03 <0.03 <0.03   BNP (last 3 results) No results for input(s): PROBNP in the last 8760 hours. HbA1C: Recent Labs    07/15/17 0505  HGBA1C 8.2*   CBG: Recent Labs  Lab 07/14/17 2242 07/15/17 0129 07/15/17 0518 07/15/17 0744 07/15/17 1151  GLUCAP 265* 326* 253* 218* 163*   Lipid Profile: Recent Labs    07/15/17 0505  CHOL 119  HDL 52  LDLCALC 59  TRIG 39  CHOLHDL 2.3   Thyroid Function Tests: No results for input(s): TSH, T4TOTAL, FREET4, T3FREE, THYROIDAB in the last 72 hours. Anemia Panel: No results for input(s): VITAMINB12, FOLATE, FERRITIN, TIBC, IRON, RETICCTPCT in the last 72 hours. Urine analysis:    Component Value Date/Time   COLORURINE YELLOW 05/02/2017 1427   APPEARANCEUR CLEAR 05/02/2017 1427   LABSPEC 1.010 05/02/2017 1427   PHURINE 6.5 05/02/2017 1427   GLUCOSEU 100 (A) 05/02/2017 1427   HGBUR MODERATE (A) 05/02/2017 1427   BILIRUBINUR NEGATIVE 05/02/2017 1427   KETONESUR NEGATIVE 05/02/2017 1427   PROTEINUR NEGATIVE 05/02/2017 1427   UROBILINOGEN 0.2 08/08/2012  1910   NITRITE NEGATIVE 05/02/2017 1427   LEUKOCYTESUR NEGATIVE 05/02/2017 1427   Sepsis Labs: @LABRCNTIP (procalcitonin:4,lacticidven:4)  )No results found for this or any previous visit (from the past 240 hour(s)).    Studies: Dg Chest 2 View  Result Date: 07/14/2017 CLINICAL DATA:  Chest pain and shortness of breath tonight, history coronary artery disease post MI, COPD, CHF, hypertension, smoker EXAM: CHEST  2 VIEW COMPARISON:  05/06/2017 FINDINGS: Normal heart size, mediastinal contours, and pulmonary vascularity. Coronary arterial stent noted. Atherosclerotic calcification aorta. Emphysematous and minimal bronchitic changes consistent with COPD. RIGHT apex scarring. No acute infiltrate, pleural effusion or pneumothorax. Calcified granuloma LEFT upper lobe. Bones demineralized. IMPRESSION: COPD changes with RIGHT upper lobe scarring. No acute abnormalities. Electronically Signed   By: Ulyses Southward M.D.   On: 07/14/2017 19:12    Scheduled Meds: . apixaban  5 mg Oral BID  .  arformoterol  15 mcg Nebulization Q12H  . benzonatate  100 mg Oral TID  . budesonide  0.5 mg Nebulization BID  . diltiazem  360 mg Oral Daily  . feeding supplement (ENSURE ENLIVE)  237 mL Oral TID BM  . gabapentin  100 mg Oral BID  . guaiFENesin  600 mg Oral BID  . hydrocortisone   Rectal BID  . insulin aspart  0-9 Units Subcutaneous TID WC  . ipratropium  0.5 mg Nebulization QID  . montelukast  5 mg Oral QHS  . pantoprazole  40 mg Oral Daily  . pramipexole  0.125 mg Oral QHS  . predniSONE  40 mg Oral Q breakfast  . rosuvastatin  20 mg Oral QHS  . saccharomyces boulardii  250 mg Oral BID    Continuous Infusions:    LOS: 0 days     Laverna PeaceShayla D Vernica Wachtel, MD [facility] Pager [phone]  If 7PM-7AM, please contact night-coverage [email or web address] Password [email or web address] 07/15/2017, 3:21 PM

## 2017-07-15 NOTE — Progress Notes (Signed)
*  PRELIMINARY RESULTS* Echocardiogram 2D Echocardiogram has been performed.  Leta JunglingCooper, Kensie Susman M 07/15/2017, 1:10 PM

## 2017-07-16 ENCOUNTER — Ambulatory Visit: Payer: Medicare Other | Admitting: Nurse Practitioner

## 2017-07-16 LAB — CBC
HEMATOCRIT: 26.3 % — AB (ref 39.0–52.0)
Hemoglobin: 8 g/dL — ABNORMAL LOW (ref 13.0–17.0)
MCH: 22.6 pg — ABNORMAL LOW (ref 26.0–34.0)
MCHC: 30.4 g/dL (ref 30.0–36.0)
MCV: 74.3 fL — AB (ref 78.0–100.0)
Platelets: 288 10*3/uL (ref 150–400)
RBC: 3.54 MIL/uL — ABNORMAL LOW (ref 4.22–5.81)
RDW: 19.6 % — ABNORMAL HIGH (ref 11.5–15.5)
WBC: 15.2 10*3/uL — ABNORMAL HIGH (ref 4.0–10.5)

## 2017-07-16 LAB — RESPIRATORY PANEL BY PCR

## 2017-07-16 LAB — BRAIN NATRIURETIC PEPTIDE: B Natriuretic Peptide: 239.6 pg/mL — ABNORMAL HIGH (ref 0.0–100.0)

## 2017-07-16 LAB — GLUCOSE, CAPILLARY
GLUCOSE-CAPILLARY: 173 mg/dL — AB (ref 65–99)
GLUCOSE-CAPILLARY: 173 mg/dL — AB (ref 65–99)
GLUCOSE-CAPILLARY: 209 mg/dL — AB (ref 65–99)
Glucose-Capillary: 203 mg/dL — ABNORMAL HIGH (ref 65–99)

## 2017-07-16 LAB — T4, FREE: Free T4: 0.78 ng/dL (ref 0.61–1.12)

## 2017-07-16 LAB — TSH: TSH: 0.864 u[IU]/mL (ref 0.350–4.500)

## 2017-07-16 MED ORDER — DOXYCYCLINE HYCLATE 100 MG PO TABS
100.0000 mg | ORAL_TABLET | Freq: Two times a day (BID) | ORAL | Status: DC
  Administered 2017-07-16 – 2017-07-19 (×7): 100 mg via ORAL
  Filled 2017-07-16 (×7): qty 1

## 2017-07-16 MED ORDER — IPRATROPIUM-ALBUTEROL 0.5-2.5 (3) MG/3ML IN SOLN
3.0000 mL | Freq: Four times a day (QID) | RESPIRATORY_TRACT | Status: DC
  Administered 2017-07-16 (×2): 3 mL via RESPIRATORY_TRACT
  Filled 2017-07-16 (×2): qty 3

## 2017-07-16 MED ORDER — TAMSULOSIN HCL 0.4 MG PO CAPS
0.4000 mg | ORAL_CAPSULE | Freq: Every day | ORAL | Status: DC
  Administered 2017-07-16 – 2017-07-18 (×3): 0.4 mg via ORAL
  Filled 2017-07-16 (×3): qty 1

## 2017-07-16 NOTE — Progress Notes (Signed)
Refused Flutter.

## 2017-07-16 NOTE — Progress Notes (Signed)
PROGRESS NOTE  FAREED FUNG WUJ:[id number] DOB: Nov 20, 1945 DOA: 07/14/2017 PCP: Eartha Inch, MD  HPI/Recap of past 24 hours:  John Marsh is a 72 y.o. year old male with medical history significant for COPD on oxygen (2 L rest, 3 w/exertion), Af on xarelto, CAD, GERDPAD, HTN who presented on 07/14/2017 with chest pain.    This morning, denies any chest pain.  Notes improvement in shortness of breath.  Assessment/Plan: Principal Problem:   Chest pain Active Problems:   Diabetes mellitus without complication (HCC)   HLD (hyperlipidemia)   Essential hypertension   Shortness of breath   COPD with acute exacerbation (HCC)   Anemia   Coronary artery disease due to lipid rich plaque   Dyspnea     ACS rule out in patient with known cardiac history.  Chest pain seems atypical (left-sided chest pressure, but occurring at rest, lasting over 2 hours, seems to improve with breathing rx).  Currently chest pain-free. EKG wnl, troponin trend negative . CXR without acute changes. LDL 59, HDL 52.TTE shows no wall motion abnormalities and preserved EF. -Monitor on tele, trop trend, Cardiology consulted may need stress test but given anemia and current restore status presumed secondary to acute exacerbation of COPD.  Acute COPD exacerbation.  3 L at rest ( incr from baseline), increase in dyspnea and cough (nonproductive) seems chest pressure as mentioned above may be related to dyspnea as patient reports some improvement with inhaler regimen.  Still with diffuse wheezing on exam today occasional rhonchi though no increased oxygen requirement currently.  Prednisone added on 1/27.  Modify inhaler regimen to scheduled duo nebs, RT eval and treat, doxycycline twice daily.  Continue to monitor will add prednisone for the next 5 days.  F/u RVP  CAD s/p stent Not on Aspirin and Plavix given patient on Eliquis.  Continue to monitor  Iron deficiency anemia, hgb stable at baseline (hemoglobin 8-9,  since 04/2017).   - 04/2017 patient's hemoglobin dropped from 13-8.   -04/2017 inpt workup:FOBT negative, CT abdomen pelvis negative for any bleeding source, no overt bleeding during hospitalization.  Hemoglobin stabilized during that admission while being on heparin.   -GI was consulted(04/2017) the patient is stable from a respiraot for endoscopic intervention and ok'd transition to oral Eliquis.  And eventually transitioned to Eliquis.   -Denies any dark tarry stool at home or here. No bleeding episodes. Continue to monitor CBC  T2DM, A1c 8.2 ( 07/15/17), continue blood glucose monitoring and sliding scale.  Atrial flutter, rate controlled, NSR. Continue home dilt and xarelto.   -Possible diltiazem may be  decrease the patient continues to have relative hypotension. - Last admission in 04/2017 cardiology wanted to discuss outpatient atrial flutter ablation in the setting of anemia while on Eliquis.  Genella Rife. protonix  HLD. crestor  BPH. flomax    Code Status: Full Code  Family Communication: Wife at bedside   Disposition Plan: chest pain rule out, Anticipate discharge in 24 hours    Consultants:  Cardiology  Procedures: TTE 07/15/17:  Study Conclusions  - Left ventricle: The cavity size was normal. Systolic function was   normal. The estimated ejection fraction was in the range of 60%   to 65%. Wall motion was normal; there were no regional wall   motion abnormalities. Left ventricular diastolic function   parameters were normal. - Aortic valve: There was very mild stenosis. Valve area (Vmax):   1.82 cm^2.  Antimicrobials:  None  Cultures:  None  DVT prophylaxis:  On eliquis   Objective: Vitals:   07/15/17 2247 07/16/17 0650 07/16/17 0726 07/16/17 1001  BP: (!) 112/45 (!) 101/45  (!) 109/54  Pulse: 79 76    Resp: 18 18    Temp: 97.9 F (36.6 C) 98.1 F (36.7 C)    TempSrc: Oral Oral    SpO2: 100% 100% 99%   Weight:  73.2 kg (161 lb 6 oz)    Height:         Intake/Output Summary (Last 24 hours) at 07/16/2017 1155 Last data filed at 07/15/2017 1400 Gross per 24 hour  Intake 480 ml  Output 900 ml  Net -420 ml   Filed Weights   07/14/17 2227 07/15/17 0500 07/16/17 0650  Weight: 73.5 kg (162 lb) 73.2 kg (161 lb 6 oz) 73.2 kg (161 lb 6 oz)    Exam:  General: Lying in bed, in no apparent distress Neck: normal, no appreciable JVD Cardiovascular: regular rate and rhythm, no murmurs, rubs or gallops, no edema Respiratory: Normal respiratory effort, lungs clear to auscultation bilaterally, no rales, wheezes or rhonchi, on 3 L [place] Abdomen: soft, non-distended, non-tender, normal bowel sounds, no guarding or rebound tenderness Skin: No Rash Musculoskeletal:No clubbing / cyanosis. No joint deformity upper and lower extremities. Good ROM, no contractures. Normal muscle tone Neurologic: Grossly no focal neuro deficit.Mental status AAOx3, speech normal, Psychiatric:Appropriate affect, and mood  Data Reviewed: CBC: Recent Labs  Lab 07/14/17 1859 07/15/17 0505 07/16/17 0834  WBC 9.4 7.8 15.2*  NEUTROABS 8.4*  --   --   HGB 8.6* 7.9* 8.0*  HCT 29.0* 26.1* 26.3*  MCV 75.3* 74.6* 74.3*  PLT 265 250 288   Basic Metabolic Panel: Recent Labs  Lab 07/14/17 1859 07/15/17 0505  NA 141 140  K 3.7 3.8  CL 104 106  CO2 27 26  GLUCOSE 203* 243*  BUN 18 20  CREATININE 0.78 0.75  CALCIUM 8.8* 8.7*   GFR: Estimated Creatinine Clearance: 86.4 mL/min (by C-G formula based on SCr of 0.75 mg/dL). Liver Function Tests: Recent Labs  Lab 07/14/17 1859 07/15/17 0505  AST 20 19  ALT 15* 13*  ALKPHOS 61 53  BILITOT 0.2* <0.1*  PROT 6.9 6.1*  ALBUMIN 4.0 3.3*   Recent Labs  Lab 07/14/17 1859  LIPASE 41   No results for input(s): AMMONIA in the last 168 hours. Coagulation Profile: Recent Labs  Lab 07/14/17 1859  INR 1.06   Cardiac Enzymes: Recent Labs  Lab 07/14/17 2248 07/15/17 0505 07/15/17 1033  TROPONINI <0.03 <0.03 <0.03     BNP (last 3 results) No results for input(s): PROBNP in the last 8760 hours. HbA1C: Recent Labs    07/15/17 0505  HGBA1C 8.2*   CBG: Recent Labs  Lab 07/15/17 1151 07/15/17 1710 07/15/17 2246 07/16/17 0735 07/16/17 1136  GLUCAP 163* 218* 242* 173* 203*   Lipid Profile: Recent Labs    07/15/17 0505  CHOL 119  HDL 52  LDLCALC 59  TRIG 39  CHOLHDL 2.3   Thyroid Function Tests: Recent Labs    07/16/17 0834  TSH 0.864  FREET4 0.78   Anemia Panel: No results for input(s): VITAMINB12, FOLATE, FERRITIN, TIBC, IRON, RETICCTPCT in the last 72 hours. Urine analysis:    Component Value Date/Time   COLORURINE YELLOW 05/02/2017 1427   APPEARANCEUR CLEAR 05/02/2017 1427   LABSPEC 1.010 05/02/2017 1427   PHURINE 6.5 05/02/2017 1427   GLUCOSEU 100 (A) 05/02/2017 1427   HGBUR MODERATE (A) 05/02/2017 1427  BILIRUBINUR NEGATIVE 05/02/2017 1427   KETONESUR NEGATIVE 05/02/2017 1427   PROTEINUR NEGATIVE 05/02/2017 1427   UROBILINOGEN 0.2 08/08/2012 1910   NITRITE NEGATIVE 05/02/2017 1427   LEUKOCYTESUR NEGATIVE 05/02/2017 1427   Sepsis Labs: @LABRCNTIP (procalcitonin:4,lacticidven:4)  )No results found for this or any previous visit (from the past 240 hour(s)).    Studies: No results found.  Scheduled Meds: . apixaban  5 mg Oral BID  . arformoterol  15 mcg Nebulization Q12H  . benzonatate  100 mg Oral TID  . budesonide  0.5 mg Nebulization BID  . diltiazem  360 mg Oral Daily  . doxycycline  100 mg Oral Q12H  . feeding supplement (ENSURE ENLIVE)  237 mL Oral TID BM  . gabapentin  100 mg Oral BID  . guaiFENesin  600 mg Oral BID  . hydrocortisone   Rectal BID  . insulin aspart  0-9 Units Subcutaneous TID WC  . ipratropium-albuterol  3 mL Nebulization Q6H  . montelukast  5 mg Oral QHS  . pantoprazole  40 mg Oral Daily  . pramipexole  0.125 mg Oral QHS  . predniSONE  40 mg Oral Q breakfast  . rosuvastatin  20 mg Oral QHS  . saccharomyces boulardii  250 mg Oral  BID    Continuous Infusions:    LOS: 1 day     Laverna PeaceShayla D Hjalmar Ballengee, MD [facility] Pager [phone]  If 7PM-7AM, please contact night-coverage [email or web address] Password [facility] 07/16/2017, 11:55 AM

## 2017-07-16 NOTE — Progress Notes (Signed)
Progress Note  Patient Name: John Marsh Date of Encounter: 07/16/2017  Primary Cardiologist: Dr. Katrinka BlazingSmith  Subjective   So far, not a bad day. No further CP. Breathing is stable. No edema. Has lost 17lbs since November unintentionally by home scales due to poor appetite.  Inpatient Medications    Scheduled Meds: . apixaban  5 mg Oral BID  . arformoterol  15 mcg Nebulization Q12H  . benzonatate  100 mg Oral TID  . budesonide  0.5 mg Nebulization BID  . diltiazem  360 mg Oral Daily  . feeding supplement (ENSURE ENLIVE)  237 mL Oral TID BM  . gabapentin  100 mg Oral BID  . guaiFENesin  600 mg Oral BID  . hydrocortisone   Rectal BID  . insulin aspart  0-9 Units Subcutaneous TID WC  . ipratropium  0.5 mg Nebulization QID  . montelukast  5 mg Oral QHS  . pantoprazole  40 mg Oral Daily  . pramipexole  0.125 mg Oral QHS  . predniSONE  40 mg Oral Q breakfast  . rosuvastatin  20 mg Oral QHS  . saccharomyces boulardii  250 mg Oral BID   Continuous Infusions:  PRN Meds: acetaminophen **OR** acetaminophen, albuterol, fluticasone, sodium chloride   Vital Signs    Vitals:   07/15/17 2103 07/15/17 2247 07/16/17 0650 07/16/17 0726  BP:  (!) 112/45 (!) 101/45   Pulse:  79 76   Resp:  18 18   Temp:  97.9 F (36.6 C) 98.1 F (36.7 C)   TempSrc:  Oral Oral   SpO2: 100% 100% 100% 99%  Weight:   161 lb 6 oz (73.2 kg)   Height:        Intake/Output Summary (Last 24 hours) at 07/16/2017 0803 Last data filed at 07/15/2017 1400 Gross per 24 hour  Intake 1020 ml  Output 900 ml  Net 120 ml   Filed Weights   07/14/17 2227 07/15/17 0500 07/16/17 0650  Weight: 162 lb (73.5 kg) 161 lb 6 oz (73.2 kg) 161 lb 6 oz (73.2 kg)    Telemetry    NSR - Personally Reviewed  Physical Exam   GEN: No acute distress.  HEENT: Normocephalic, atraumatic, sclera non-icteric. Neck: No JVD or bruits. Cardiac: RRR with distant heart sounds, no murmurs, rubs, or gallops.  Radials/DP/PT 1+ and  equal bilaterally.  Respiratory: Moderately diminished throughout with faint end expiratory wheezing. Breathing is unlabored. GI: Soft, nontender, non-distended, BS +x 4. MS: no deformity. Extremities: No clubbing or cyanosis. No edema. Distal pedal pulses are 2+ and equal bilaterally. Neuro:  AAOx3. Follows commands. Psych:  Responds to questions appropriately with a normal affect.  Labs    Chemistry Recent Labs  Lab 07/14/17 1859 07/15/17 0505  NA 141 140  K 3.7 3.8  CL 104 106  CO2 27 26  GLUCOSE 203* 243*  BUN 18 20  CREATININE 0.78 0.75  CALCIUM 8.8* 8.7*  PROT 6.9 6.1*  ALBUMIN 4.0 3.3*  AST 20 19  ALT 15* 13*  ALKPHOS 61 53  BILITOT 0.2* <0.1*  GFRNONAA >60 >60  GFRAA >60 >60  ANIONGAP 10 8     Hematology Recent Labs  Lab 07/14/17 1859 07/15/17 0505  WBC 9.4 7.8  RBC 3.85* 3.50*  HGB 8.6* 7.9*  HCT 29.0* 26.1*  MCV 75.3* 74.6*  MCH 22.3* 22.6*  MCHC 29.7* 30.3  RDW 19.5* 19.7*  PLT 265 250    Cardiac Enzymes Recent Labs  Lab 07/14/17 2248 07/15/17  0505 07/15/17 1033  TROPONINI <0.03 <0.03 <0.03    Recent Labs  Lab 07/14/17 1910  TROPIPOC 0.00     BNPNo results for input(s): BNP, PROBNP in the last 168 hours.   DDimer  Recent Labs  Lab 07/14/17 1859  DDIMER 0.30     Radiology    Dg Chest 2 View  Result Date: 07/14/2017 CLINICAL DATA:  Chest pain and shortness of breath tonight, history coronary artery disease post MI, COPD, CHF, hypertension, smoker EXAM: CHEST  2 VIEW COMPARISON:  05/06/2017 FINDINGS: Normal heart size, mediastinal contours, and pulmonary vascularity. Coronary arterial stent noted. Atherosclerotic calcification aorta. Emphysematous and minimal bronchitic changes consistent with COPD. RIGHT apex scarring. No acute infiltrate, pleural effusion or pneumothorax. Calcified granuloma LEFT upper lobe. Bones demineralized. IMPRESSION: COPD changes with RIGHT upper lobe scarring. No acute abnormalities. Electronically Signed    By: Ulyses Southward M.D.   On: 07/14/2017 19:12    Cardiac Studies   2D Echo 07/15/17 Study Conclusions - Left ventricle: The cavity size was normal. Systolic function was   normal. The estimated ejection fraction was in the range of 60%   to 65%. Wall motion was normal; there were no regional wall   motion abnormalities. Left ventricular diastolic function   parameters were normal. - Aortic valve: There was very mild stenosis. Valve area (Vmax):   1.82 cm^2.  Patient Profile     72 y.o. male with COPD, chronic respiratory failure on 3L home oxygen, HTN, HLD, GERD, PVD (details unknown), CAD (remote stenting 2007, further details unavailable), tobacco use, pulm nodules by CT 04/2017, DM (by labs 06/2017) and recently diagnosed PAF vs flutter.  He was recently in the hospital 04/2017 with increased SOB/CP in setting of acute PNA, COPD exacerbation and sepsis. During that admission he developedatrial fibrillation with RVR (?later notes suggest atrial flutter only). He was also noted to be anemic with Hgb in 8 range (down from 13 in 09/2016); FOBT negative, studies suggestive of iron deficiency. GI Dr. Georgiann Cocker followed - CTAbd/pelvis neg for [facility]; outpt GI eval recommended. He was readmitted with continued/worsening SOB (x 1 year) as well as chest tightness (NSR on adm).  Assessment & Plan     1. Chest discomfort/dyspnea on exertion - I am concerned this is multifactorial in the setting of known CAD and COPD as well as continued new anemia since 09/2016. Previous hemoglobins have all been in the 14 range but he has had steady decline since that time. I question whether he needs to proceed with inpatient GI evaluation/endoscopic examination to definitively exclude GI source (also note unintentional weight loss, but he attributes this to poor appetite). I do agree with need to proceed with stress test but given progressive anemia (and potential need to consider antiplatelet therapy in the future in  conjunction with recently started Eliquis), this also should be concomitantly evaluated. I will discuss further with Dr. Elease Hashimoto. Would also continue to recommend IM treat AECOPD as he continues to have active wheezing on exam.  2. CAD s/p remote stenting 2007 - ASA/Plavix recently stopped due to initiation of Eliquis. See above. No BB with wheezing. Continue statin. Recent LDL 59.  3. Paroxysmal atrial fib vs flutter per notes 04/2017 - check baseline thyroid with labs this AM. Maintaining NSR this admission. Continue diltiazem as BP allows. May need to lower dose to 300mg  daily if hypotension persists. Would recommend OP EP eval per prior cardiology notes as if they feel this represented all atrial flutter,  RFA could be considered given anemia and ongoing issue of anticoagulation.  4. Anemia - as above.  For questions or updates, please contact [facility] Please consult [email or web address] for contact info under Cardiology/STEMI.  Signed, Laurann Montana, PA-C 07/16/2017, 8:03 AM     Attending Note:   The patient was seen and examined.  Agree with assessment and plan as noted above.  Changes made to the above note as needed.  Patient seen and independently examined with Ronie Spies, PA .   We discussed all aspects of the encounter. I agree with the assessment and plan as stated above.  1.  Chest discomfort: His episodes of chest discomfort seem to be related to his anemia.  I think our first goal would be to transfuse him to a hemoglobin of greater than 10 and see if his anginal-like symptoms resolved.  We can certainly consider a stress test at some point.  He states that the CP is very minimal   2.  Weight loss: He has ongoing anemia with weight loss is somewhat concerning.  Further workup per GI and internal medicine.  He has lost 50-6- lbs over the past several years.   This is concerning for cancer   3.  Paroxysmal atrial fib: Continue Eliquis for now.  We will watch for any signs of  worsening bleeding.     I have spent a total of 40 minutes with patient reviewing hospital  notes , telemetry, EKGs, labs and examining patient as well as establishing an assessment and plan that was discussed with the patient. > 50% of time was spent in direct patient care.    Vesta Mixer, Montez Hageman., MD, [facility] 07/16/2017, 2:28 PM [address]  [address] Office [phone] Pager [phone]

## 2017-07-16 NOTE — Progress Notes (Signed)
Initial Nutrition Assessment  DOCUMENTATION CODES:   Not applicable  INTERVENTION:  RD to continue Ensure Enlive TID; each supplement provides 350kcals and 20g of protein.  Dietetic intern encouraged solid food intake, with an emphasis on protein, in addition to supplement intake.    NUTRITION DIAGNOSIS:   Inadequate oral intake related to poor appetite as evidenced by per patient/family report, meal completion < 25%, mild muscle depletion.   GOAL:   Patient will meet greater than or equal to 90% of their needs   MONITOR:   PO intake, Weight trends, Supplement acceptance, Labs  REASON FOR ASSESSMENT:   Malnutrition Screening Tool, Consult COPD Protocol  ASSESSMENT:   72 y.o. male with COPD, chronic respiratory failure on 3L home oxygen, HTN, hyperlipidemia, GERD, PVD, CAD, tobacco use, DM, recently hospitalized 04/2017 for SOB/CP in setting of acute PNA, COPD exacerbation and sepsis, developed a-fib at that visit.   Pt reports consistently poor appetite and p.o. Intake For the past year. Pt reports his PCP recently recommended drinking Ensure 3x/day at home due to poor intake and recent weight loss. Pt states his UBW is 221lb which could not be confirmed in the medical record. Pt admits to 17lb weight loss in the last month. Per recorded weights, pt has had 18lb loss (10%) since 05/06/17, which is significant for time frame.   Pt reports he was full after only eggs and coffee for breakfast. He also reports satiety from 100% of his pork chop and coffee for dinner last night. Per recorded meal intake, pt consumed 100% of lunch yesterday (07/15/16).  Pt reports he has been drinking Ensure TID during this admission and would like to continue. Pt denies that nutrition supplements interfere with solid food intake/early satiety; He reports poor intake prior to initiating supplement regimen at home. Pt denies N/V and issues with chewing/swallowing but admits to possible smell and taste  changes.   Medications: Mealtime insulin, prednisone, florastor, protonix, doxycycline.  Labs: CBG (last 3)  Recent Labs    07/15/17 1710 07/15/17 2246 07/16/17 0735  GLUCAP 218* 242* 173*  HbA1c 8.2 Hgb 7.9   NUTRITION - FOCUSED PHYSICAL EXAM:    Most Recent Value  Orbital Region  No depletion  Upper Arm Region  Mild depletion  Thoracic and Lumbar Region  No depletion  Buccal Region  No depletion  Temple Region  Mild depletion  Clavicle Bone Region  Unable to assess  Clavicle and Acromion Bone Region  Mild depletion  Scapular Bone Region  Mild depletion  Dorsal Hand  No depletion  Patellar Region  No depletion  Anterior Thigh Region  Moderate depletion  Posterior Calf Region  Moderate depletion  Edema (RD Assessment)  Unable to assess     Lower body depletion appears to be due to limited mobility from COPD. Pt reports he uses mobility assistance (cane/walker/scooter/golf cart) at home.  Pt appears to have mild upper body depletion, however if poor p.o. Intake continues, pt may be at risk for malnutrition.  Diet Order:  Diet Heart Room service appropriate? Yes; Fluid consistency: Thin  EDUCATION NEEDS:   No education needs have been identified at this time  Skin:  Skin Assessment: Reviewed RN Assessment  Last BM:  07/14/2017  Height:   Ht Readings from Last 1 Encounters:  07/14/17 6' (1.829 m)    Weight:  Wt Readings from Last 3 Encounters:  07/16/17 161 lb 6 oz (73.2 kg)  05/17/17 168 lb 8 oz (76.4 kg)  05/09/17 176 lb  6.4 oz (80 kg)  05/06/17  179lb 3.2oz (81.3kg)   Ideal Body Weight:  80.9 kg  BMI:  Body mass index is 21.89 kg/m.  Estimated Nutritional Needs:   Kcal:  2200-2400  Protein:  110-120  Fluid:  >2.2L   Decklin Weddington, MS, Dietetic Intern Pager # [phone]

## 2017-07-16 NOTE — Care Management Note (Signed)
Case Management Note  Patient Details  Name: John Marsh MRN: [id number] Date of Birth: 02/26/1946  Subjective/Objective: 72 y/o m admitted w/chest pain. Hx: COPD. Qualifies for COPD Gold program-3 adm/296months. [facility]chose [facility] rep Clydie BraunKaren aware-HHRN ordered. Patient already has home 02-[employer], has travel tank.He had concerns about different tubing for [name]-informed him to contact [employer] about different tubing that they may have-patient voiced understanding.                    Action/Plan:d/c plan home w/HHC-COPD gold   Expected Discharge Date:                  Expected Discharge Plan:  Home w Home Health Services  In-House Referral:     Discharge planning Services  CM Consult  Post Acute Care Choice:    Choice offered to:  Patient  DME Arranged:    DME Agency:     HH Arranged:  RN HH Agency:  [facility]  Status of Service:  In process, will continue to follow  If discussed at Long Length of Stay Meetings, dates discussed:    Additional Comments:  Lanier ClamMahabir, [name], RN 07/16/2017, 12:28 PM

## 2017-07-17 DIAGNOSIS — R634 Abnormal weight loss: Secondary | ICD-10-CM

## 2017-07-17 DIAGNOSIS — R0789 Other chest pain: Secondary | ICD-10-CM

## 2017-07-17 DIAGNOSIS — D509 Iron deficiency anemia, unspecified: Secondary | ICD-10-CM

## 2017-07-17 DIAGNOSIS — R630 Anorexia: Secondary | ICD-10-CM

## 2017-07-17 LAB — CBC
HEMATOCRIT: 24.7 % — AB (ref 39.0–52.0)
HEMOGLOBIN: 7.6 g/dL — AB (ref 13.0–17.0)
MCH: 22.6 pg — AB (ref 26.0–34.0)
MCHC: 30.8 g/dL (ref 30.0–36.0)
MCV: 73.5 fL — AB (ref 78.0–100.0)
Platelets: 242 10*3/uL (ref 150–400)
RBC: 3.36 MIL/uL — ABNORMAL LOW (ref 4.22–5.81)
RDW: 19.2 % — ABNORMAL HIGH (ref 11.5–15.5)
WBC: 10 10*3/uL (ref 4.0–10.5)

## 2017-07-17 LAB — BASIC METABOLIC PANEL
ANION GAP: 8 (ref 5–15)
BUN: 27 mg/dL — ABNORMAL HIGH (ref 6–20)
CALCIUM: 8.7 mg/dL — AB (ref 8.9–10.3)
CHLORIDE: 103 mmol/L (ref 101–111)
CO2: 28 mmol/L (ref 22–32)
Creatinine, Ser: 0.8 mg/dL (ref 0.61–1.24)
GFR calc Af Amer: >60 mL/min (ref >60)
GFR calc non Af Amer: >60 mL/min (ref >60)
Glucose, Bld: 147 mg/dL — ABNORMAL HIGH (ref 65–99)
Potassium: 3.3 mmol/L — ABNORMAL LOW (ref 3.5–5.1)
Sodium: 139 mmol/L (ref 135–145)

## 2017-07-17 LAB — GLUCOSE, CAPILLARY
GLUCOSE-CAPILLARY: 142 mg/dL — AB (ref 65–99)
GLUCOSE-CAPILLARY: 247 mg/dL — AB (ref 65–99)
GLUCOSE-CAPILLARY: 211 mg/dL — AB (ref 65–99)
Glucose-Capillary: 159 mg/dL — ABNORMAL HIGH (ref 65–99)

## 2017-07-17 LAB — MAGNESIUM: Magnesium: 1.9 mg/dL (ref 1.7–2.4)

## 2017-07-17 MED ORDER — DILTIAZEM HCL ER COATED BEADS 180 MG PO CP24
300.0000 mg | ORAL_CAPSULE | Freq: Every day | ORAL | Status: DC
  Administered 2017-07-18 – 2017-07-19 (×2): 300 mg via ORAL
  Filled 2017-07-17 (×2): qty 1

## 2017-07-17 MED ORDER — ARFORMOTEROL TARTRATE 15 MCG/2ML IN NEBU
15.0000 ug | INHALATION_SOLUTION | Freq: Two times a day (BID) | RESPIRATORY_TRACT | Status: DC
Start: 2017-07-17 — End: 2017-07-19
  Administered 2017-07-17 – 2017-07-19 (×3): 15 ug via RESPIRATORY_TRACT
  Filled 2017-07-17 (×4): qty 2

## 2017-07-17 MED ORDER — IPRATROPIUM BROMIDE HFA 17 MCG/ACT IN AERS: 2.0000 | INHALATION_SPRAY | Freq: Four times a day (QID) | RESPIRATORY_TRACT | Status: DC

## 2017-07-17 MED ORDER — IPRATROPIUM BROMIDE 0.02 % IN SOLN
0.5000 mg | Freq: Four times a day (QID) | RESPIRATORY_TRACT | Status: DC
  Administered 2017-07-17 – 2017-07-19 (×6): 0.5 mg via RESPIRATORY_TRACT
  Filled 2017-07-17 (×7): qty 2.5

## 2017-07-17 MED ORDER — IPRATROPIUM-ALBUTEROL 0.5-2.5 (3) MG/3ML IN SOLN
3.0000 mL | Freq: Four times a day (QID) | RESPIRATORY_TRACT | Status: DC
  Administered 2017-07-17 (×2): 3 mL via RESPIRATORY_TRACT
  Filled 2017-07-17 (×2): qty 3

## 2017-07-17 MED ORDER — POTASSIUM CHLORIDE CRYS ER 20 MEQ PO TBCR
40.0000 meq | EXTENDED_RELEASE_TABLET | Freq: Once | ORAL | Status: AC
  Administered 2017-07-17: 40 meq via ORAL
  Filled 2017-07-17: qty 2

## 2017-07-17 NOTE — Consult Note (Signed)
[facility] Consult: 2:39 PM 07/17/2017  LOS: 2 days    Referring Provider: Dr. Dennison Nancy Primary Care Physician:  Eartha Inch, MD Primary Gastroenterologist:  Gentry Fitz.       Reason for Consultation:  Anemia.     HPI: John Marsh is a 72 y.o. male. Hx COPD, chronic respiratory failure requiring home oxygen.  DM2 on no meds.  Hypertension.  Peripheral vascular disease, details unknown.  CAD with remote cardiac stenting 2007.  On Eliquis since 04/2017.  Previously took Plavix, 81 ASA  but these were discontinued in 04/2017.  07/15/17 echocardiogram shows LVEF 60 -65%, no diastolic failure, mild aortic valve stenosis.  Pulmonary nodule.  S/p laparoscopic cholecystectomy Paroxysmal atrial fib versus flutter occurring during hospital admission 04/2017 when he was treated for acute pneumonia, sepsis, COPD. During the 04/2017 admission Hgb 8, down from 13 in 09/2016.  Iron was 10, ferritin 3.  CT abdomen pelvis 11/15 neg for malignancy and RP hemorrhage.  FOBT negative.  [facility] , Dr Doretha Imus, suggested outpatient GI evaluation.   Contact number for [facility] was provided but patient did not follow-up with GI and Dr. Doretha Imus is choosing not to see the patient now.  Patient has never undergone upper endoscopy or colonoscopy.    Patient admitted 2 d ago with chest pain, DOE which cardiology is attributing part to symptomatic anemia as Hgb is 7.6.  MCV 73.  Troponins are negative.  BNP elevated. He reports anorexia and unintentional weight loss.  In the last 2 years he is dropped 30 #, in the last month he has dropped 12 #.  Appetite is such that he can go 3 or 4 days drinking coffee only and does not get hungry.  He does not suffer nausea vomiting.  Denies abdominal pain.  Stools are brown and occur daily.  No  dysphasia.  No heartburn or indigestion.  Attempt was made at blood transfusion back in 04/2017 but the patient apparently became combative and confused and did not complete of transfusion.  He has not been taking any iron supplements. Dr Elease Hashimoto is reluctant to proceed with cardiac cath until the source of his anemia has been determined, since it is possible he may end up needing dual and platelet therapy in addition to Eliquis.    Past Medical History:  Diagnosis Date  . Anemia   . Atrial fibrillation (HCC)    Chadsvasc2=3  . COPD (chronic obstructive pulmonary disease) (HCC)   . Coronary heart disease   . Emphysema of lung (HCC)   . Hyperlipidemia   . Hypertension   . Iron deficiency anemia   . MI (myocardial infarction) (HCC)   . Multiple lung nodules on CT   . Nocturnal hypoxia   . Peripheral arterial disease (HCC)   . Shortness of breath dyspnea     Past Surgical History:  Procedure Laterality Date  . CHOLECYSTECTOMY    . NASAL SEPTUM SURGERY    . PERIPHERAL VASCULAR CATHETERIZATION N/A 07/27/2015   Procedure: Abdominal Aortogram;  Surgeon: Chuck Hint, MD;  Location: [facility]VAS[facility] CV [facility];  Service: Cardiovascular;  Laterality: N/A;  . PERIPHERAL VASCULAR CATHETERIZATION Bilateral 07/27/2015   Procedure: Lower Extremity Angiography;  Surgeon: Chuck Hinthristopher S Dickson, MD;  [facility];  Service: Cardiovascular;  Laterality: Bilateral;  . stent placed      Prior to Admission medications   Medication Sig Start Date End Date Taking? Authorizing Provider  acetaminophen (TYLENOL) 325 MG tablet Take 650 mg by mouth every 6 (six) hours as needed for mild pain.   Yes [provider]  albuterol (PROVENTIL HFA;VENTOLIN HFA) 108 (90 Base) MCG/ACT inhaler Inhale 2 puffs into the lungs every 6 (six) hours as needed for wheezing or shortness of breath. 02/05/17  Yes Roslynn AmbleNestor, Jennings E, MD  apixaban (ELIQUIS) 5 MG TABS tablet Take 1 tablet (5 mg total) 2 (two)  times daily by mouth. 05/06/17  Yes Rolly SalterPatel, Pranav M, MD  ATROVENT HFA 17 MCG/ACT inhaler Take 2 puffs by mouth 4 (four) times daily. 07/10/17  Yes [provider]  benzonatate (TESSALON) 100 MG capsule Take 1 capsule (100 mg total) 3 (three) times daily by mouth. 05/06/17  Yes Rolly SalterPatel, Pranav M, MD  budesonide (PULMICORT) 0.5 MG/2ML nebulizer solution Take 2 mLs (0.5 mg total) by nebulization 2 (two) times daily. 03/07/17  Yes Roslynn AmbleNestor, Jennings E, MD  diltiazem (CARDIZEM CD) 360 MG 24 hr capsule Take 1 capsule (360 mg total) daily by mouth. 05/07/17  Yes Rolly SalterPatel, Pranav M, MD  feeding supplement, ENSURE ENLIVE, (ENSURE ENLIVE) LIQD Take 237 mLs by mouth 3 (three) times daily between meals. 09/26/16  Yes Narda BondsNettey, Ralph A, MD  fluticasone (FLONASE) 50 MCG/ACT nasal spray Place 2 sprays into both nostrils daily as needed for allergies or rhinitis. 09/26/16  Yes Narda BondsNettey, Ralph A, MD  formoterol (PERFOROMIST) 20 MCG/2ML nebulizer solution Take 2 mLs (20 mcg total) by nebulization 2 (two) times daily. 02/13/17  Yes Roslynn AmbleNestor, Jennings E, MD  gabapentin (NEURONTIN) 100 MG capsule Take 1 capsule (100 mg total) 2 (two) times daily by mouth. 05/06/17  Yes Rolly SalterPatel, Pranav M, MD  guaiFENesin (MUCINEX) 600 MG 12 hr tablet Take 1 tablet (600 mg total) by mouth 2 (two) times daily. 09/26/16  Yes Narda BondsNettey, Ralph A, MD  hydrocortisone (ANUSOL-HC) 2.5 % rectal cream Place rectally 2 (two) times daily. 05/09/17  Yes Vassie LollMadera, Carlos, MD  ipratropium (ATROVENT) 0.02 % nebulizer solution Take 2.5 mLs (0.5 mg total) by nebulization 4 (four) times daily. 05/17/17  Yes Roslynn AmbleNestor, Jennings E, MD  menthol-cetylpyridinium (CEPACOL) 3 MG lozenge Take 1 lozenge (3 mg total) as needed by mouth for sore throat. 05/06/17  Yes Rolly SalterPatel, Pranav M, MD  montelukast (SINGULAIR) 5 MG chewable tablet CHEW 1 TABLET BY MOUTH AT BEDTIME 02/01/16  Yes Roslynn AmbleNestor, Jennings E, MD  nitroGLYCERIN (NITROSTAT) 0.4 MG SL tablet Place 0.4 mg under the tongue every 5 (five)  minutes as needed for chest pain. x3 doses as needed for chest pain   Yes [provider]  pantoprazole (PROTONIX) 40 MG tablet Take 1 tablet (40 mg total) by mouth daily. 07/01/16  Yes Erick BlinksMemon, Jehanzeb, MD  pramipexole (MIRAPEX) 0.125 MG tablet Take 1 tablet by mouth at bedtime. 06/21/16  Yes [provider]  predniSONE (DELTASONE) 10 MG tablet Take 60mg  daily for 3 days,Take 40mg  daily for 3days,Take 30mg  daily for 3days,Take 20mg  daily for 3days,Take 10mg  daily for 3days, then stop. 05/09/17  Yes Vassie LollMadera, Carlos, MD  rosuvastatin (CRESTOR) 20 MG tablet Take 20 mg by mouth at bedtime.  Yes [provider]  saccharomyces boulardii (FLORASTOR) 250 MG capsule Take 1 capsule (250 mg total) by mouth 2 (two) times daily. 05/09/17  Yes Vassie Loll, MD  sodium chloride (OCEAN) 0.65 % SOLN nasal spray Place 1 spray as needed into both nostrils for congestion (nose irritation). 05/06/17  Yes Rolly Salter, MD  Spacer/Aero-Holding Chambers (AEROCHAMBER Z-STAT PLUS) inhaler Use as instructed 03/04/15  Yes Roslynn Amble, MD  tamsulosin (FLOMAX) 0.4 MG CAPS capsule Take 0.4 mg by mouth.   Yes [provider]  triamcinolone (NASACORT ALLERGY 24HR) 55 MCG/ACT AERO nasal inhaler Place 2 sprays daily as needed into the nose (allergies).    Yes [provider]  levalbuterol (XOPENEX) 1.25 MG/0.5ML nebulizer solution Take 1.25 mg every 6 (six) hours as needed by nebulization for wheezing or shortness of breath. Patient not taking: Reported on 07/14/2017 05/06/17   Rolly Salter, MD    Scheduled Meds: . apixaban  5 mg Oral BID  . arformoterol  15 mcg Nebulization Q12H  . benzonatate  100 mg Oral TID  . [START ON 07/18/2017] diltiazem  300 mg Oral Daily  . doxycycline  100 mg Oral Q12H  . feeding supplement (ENSURE ENLIVE)  237 mL Oral TID BM  . gabapentin  100 mg Oral BID  . guaiFENesin  600 mg Oral BID  . hydrocortisone   Rectal BID  . insulin aspart  0-9 Units  Subcutaneous TID WC  . ipratropium  0.5 mg Nebulization QID  . montelukast  5 mg Oral QHS  . pantoprazole  40 mg Oral Daily  . pramipexole  0.125 mg Oral QHS  . predniSONE  40 mg Oral Q breakfast  . rosuvastatin  20 mg Oral QHS  . saccharomyces boulardii  250 mg Oral BID  . tamsulosin  0.4 mg Oral QPC supper   Infusions:  PRN Meds: acetaminophen **OR** acetaminophen, albuterol, fluticasone, sodium chloride   Allergies as of 07/14/2017  . (No Known Allergies)    Family History  Problem Relation Age of Onset  . Heart disease Mother        died at 34  . Heart disease Father   . Heart disease Brother   . Heart disease Sister   . Lung disease Neg Hx     Social History   Socioeconomic History  . Marital status: Married    Spouse name: Not on file  . Number of children: Not on file  . Years of education: Not on file  . Highest education level: Not on file  Social Needs  . Financial resource strain: Not on file  . Food insecurity - worry: Not on file  . Food insecurity - inability: Not on file  . Transportation needs - medical: Not on file  . Transportation needs - non-medical: Not on file  Occupational History  . Occupation: retired  Tobacco Use  . Smoking status: Current Some Day Smoker    Packs/day: 0.50    Years: 55.00    Pack years: 27.50    Types: Cigarettes    Start date: 08/01/1959  . Smokeless tobacco: Never Used  . Tobacco comment: 1/2 ppd 06/03/15 - peak 3ppd   taking Chantix currently 07-20-2015  Substance and Sexual Activity  . Alcohol use: No    Alcohol/week: 0.0 oz  . Drug use: No  . Sexual activity: No  Other Topics Concern  . Not on file  Social History Narrative   Originally from [place]. Previously has lived in [place].  He moved to [facility] in [place]. He serve in [place]. 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 He has been to [place], [place], [place], several countries in [place], countries in [place], [place], & multiple [place]ries. 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 He has a dog, 7 cats, & 2 horses. Remote exposure to a parrot for a few years in a different home. No mold exposure. No hot tub exposure. 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     REVIEW OF SYSTEMS: Constitutional: Fatigue, mild weakness. ENT:  No nose bleeds Pulm: Dyspnea on exertion.  Productive cough.  Down from smoking 3 packs a day to about 1 cigarette/month. CV:  No palpitations, no LE edema.  GU:  No hematuria, no frequency GI:  Per HPI Heme: No unusual or excessive bleeding or bruising. Transfusions:  Per HPI Neuro:  No headaches, no peripheral tingling or numbness Derm:  No itching, no rash or sores.  Endocrine:  No sweats or chills.  No polyuria or dysuria Immunization:  Up to date with flu and pneumovax.   Travel:  None beyond local counties in last few months.    PHYSICAL EXAM: Vital signs in last 24 hours: Vitals:   07/17/17 1140 07/17/17 1308  BP:  (!) 111/54  Pulse: 68 87  Resp: 17 18  Temp:  98.2 F (36.8 C)  SpO2: 98% 99%   Wt Readings from Last 3 Encounters:  07/17/17 73.1 kg (161 lb 3.2 oz)  05/17/17 76.4 kg (168 lb 8 oz)  05/09/17 80 kg (176 lb 6.4 oz)    General: Non-ill appearing WM who appears his stated age. Head: No facial asymmetry or swelling.  No signs of head trauma. Eyes: No scleral icterus.  Conjunctiva slightly pale.  EOMI. Ears: No obvious hearing deficit. Nose: No discharge Mouth: Tongue midline.  Oral mucosa is pink, moist, clear. Neck: No JVD.  No thyromegaly.  No masses, no bruits. Lungs: Greatly diminished breath sounds throughout.  No adventitious sounds.  No dyspnea at rest.  No cough. Heart: RRR.  No MRG.  S1, S2 present. Abdomen: Soft.  Not tender or distended.  No masses, HSM, bruits, hernias..   Rectal: Deferred Musc/Skeltl: No gross joint deformities, swelling or  redness. Extremities: No CCE.  Feet are warm, brisk capillary refill. Neurologic: Alert.  Oriented x3.  Moves all 4 limbs, limb strength not tested.  No gross deficits.  No tremors. Skin: No rash, no sores, ectasia.  Leathery texture consistent with excessive sun exposure. Tattoos: None Nodes: No cervical or inguinal adenopathy Psych: Pleasant, cooperative.  Not depressed or agitated.  Intake/Output from previous day: 01/28 0701 - 01/29 0700 In: 720 [P.O.:720] Out: -  Intake/Output this shift: Total I/O In: 120 [P.O.:120] Out: -   LAB RESULTS: Recent Labs    07/15/17 0505 07/16/17 0834 07/17/17 0526  WBC 7.8 15.2* 10.0  HGB 7.9* 8.0* 7.6*  HCT 26.1* 26.3* 24.7*  PLT 250 288 242   BMET Lab Results  Component Value Date   NA 139 07/17/2017   NA 140 07/15/2017   NA 141 07/14/2017   K 3.3 (L) 07/17/2017   K 3.8 07/15/2017   K 3.7 07/14/2017   CL 103 07/17/2017   CL 106 07/15/2017   CL 104 07/14/2017   CO2 28 07/17/2017   CO2 26 07/15/2017   CO2 27 07/14/2017  GLUCOSE 147 (H) 07/17/2017   GLUCOSE 243 (H) 07/15/2017   GLUCOSE 203 (H) 07/14/2017   BUN 27 (H) 07/17/2017   BUN 20 07/15/2017   BUN 18 07/14/2017   CREATININE 0.80 07/17/2017   CREATININE 0.75 07/15/2017   CREATININE 0.78 07/14/2017   CALCIUM 8.7 (L) 07/17/2017   CALCIUM 8.7 (L) 07/15/2017   CALCIUM 8.8 (L) 07/14/2017   LFT Recent Labs    07/14/17 1859 07/15/17 0505  PROT 6.9 6.1*  ALBUMIN 4.0 3.3*  AST 20 19  ALT 15* 13*  ALKPHOS 61 53  BILITOT 0.2* <0.1*   PT/INR Lab Results  Component Value Date   INR 1.06 07/14/2017   INR 1.02 05/01/2017   Hepatitis Panel No results for input(s): HEPBSAG, HCVAB, HEPAIGM, HEPBIGM in the last 72 hours. C-Diff No components found for: CDIFF Lipase     Component Value Date/Time   LIPASE 41 07/14/2017 1859    Drugs of Abuse  No results found for: LABOPIA, COCAINSCRNUR, LABBENZ, AMPHETMU, THCU, LABBARB   RADIOLOGY STUDIES: No results found.     IMPRESSION:   *   Iron deficiency anemia.  Present for several months.  No overt GI bleeding.  * anorexi and wight loss. No overt malignancy on CT ab/pelvis in 04/2017 Has never undergone screening colonoscopy or upper endoscopy.  *   Chest discomfort, DOE.  Remote history CAD and stenting 2007.  Repeat cardiac cath on hold until source of IDA defined.  *  COPD, emphysema.  PLAN:     *   Discussed possibility of pursuing upper endoscopy and colonoscopy with the patient.  He is agreeable to proceed should the doctor decide to go ahead with these studies.   Jennye Moccasin  07/17/2017, 2:39 PM Pager: [phone]  ________________________________________________________________________  [facility] MD note:  I personally examined the patient, reviewed the data and agree with the assessment and plan described above.  He has significant IDA, recent anorexia and weight loss. Was seen by [facility] 2 months ago for this same issue but never follow up with them.  Daily 1.2 grams of ibuprofen for at 2-3 years may contribute to his IDA but he needs colonoscopy and EGD to check for other sources.  Cannot do this tomorrow for him because he is on a blood thinner.  I will have the eliquis held starting tomorrow morning and will plan for him to start prepping tomorrow afternoon.   Rob Bunting, MD [facility] Pager [phone]

## 2017-07-17 NOTE — Progress Notes (Signed)
PROGRESS NOTE  John Marsh ZOX:[id number] DOB: Apr 05, 1946 DOA: 07/14/2017 PCP: Eartha Inch, MD  HPI/Recap of past 24 hours:  John Marsh is a 72 y.o. year old male with medical history significant for COPD on oxygen (2 L rest, 3 w/exertion), Af on xarelto, CAD, GERDPAD, HTN who presented on 07/14/2017 with chest pain.    Feels better with breathing, no CP. No fevers, chills.   Assessment/Plan: Principal Problem:   Chest pain Active Problems:   Diabetes mellitus without complication (HCC)   HLD (hyperlipidemia)   Essential hypertension   Shortness of breath   COPD with acute exacerbation (HCC)   Anemia   Coronary artery disease due to lipid rich plaque   Dyspnea   ACS rule out in patient with known cardiac history.  Chest pain seems atypical (left-sided chest pressure, but occurring at rest, lasting over 2 hours, seems to improve with breathing rx).  Currently chest pain-free. EKG wnl, troponin trend negative . CXR without acute changes. LDL 59, HDL 52.TTE shows no wall motion abnormalities and preserved EF.    Anemia is a concern for  potential cause for dyspnea and chest pain.  Fairly recent (04/2017) - GI consulted, addressed below -Monitor on tele, trop trend, Cardiology consulted may need stress test but given anemia and current respiratory status presumed secondary to acute exacerbation of COPD need to address  Iron deficiency anemia, hgb stable at baseline (hemoglobin 8-9, since 04/2017).   - 04/2017 patient's hemoglobin dropped from 13-8.  While on heparin for new atrial flutter, GI workup at that time was negative (FOBT negative, CT abdomen negative for bleeding) -No current bleeding, patient denies any melena, but recent anorexia/wgt loss, concern for malignancy -GI consulted, holding home Eliquis(starting tomorrow), plan for EGD/Colonoscopy later this week -Continue to monitor CBC  Acute COPD exacerbation, improving Doing much better today from breathing  standpoint with almost no wheezing on my exam. Stable on home oxygen regimen    - Prednisone and Doxycycline ( 5 days total), resume home regimen: atrovent QID, brovana BID, singulair, (holding budesonide while on oral steroids,)RT eval and treat, doxycycline twice daily.  Continue to monitor will add prednisone for the next 5 days.  RVP negative  CAD s/p stent Not on Aspirin and Plavix given patient on Eliquis.  Continue to monitor  T2DM, A1c 8.2 ( 07/15/17), continue blood glucose monitoring and sliding scale.  Atrial flutter, rate controlled, NSR. Continue home dilt - hold xarelto.   -Possible diltiazem may be  decrease the patient continues to have relative hypotension, continue to monitor - Last admission in 04/2017 cardiology wanted to discuss outpatient atrial flutter ablation in the setting of anemia while on Eliquis.  John Marsh. protonix  HLD. crestor  BPH. flomax    Code Status: Full Code  Family Communication: No family at bedside  Disposition Plan: plan for EGD/colon after holding eliquis  Consultants:  Cardiology, GI  Procedures: TTE 07/15/17:  Study Conclusions  - Left ventricle: The cavity size was normal. Systolic function was   normal. The estimated ejection fraction was in the range of 60%   to 65%. Wall motion was normal; there were no regional wall   motion abnormalities. Left ventricular diastolic function   parameters were normal. - Aortic valve: There was very mild stenosis. Valve area (Vmax):   1.82 cm^2.  Antimicrobials:  None  Cultures:  None  DVT prophylaxis:  On eliquis   Objective: Vitals:   07/17/17 0436 07/17/17 0758 07/17/17 1140 07/17/17  1308  BP: (!) 118/56   (!) 111/54  Pulse: 76 73 68 87  Resp: 18 18 17 18   Temp: 98.1 F (36.7 C)   98.2 F (36.8 C)  TempSrc: Oral   Oral  SpO2: 100% 93% 98% 99%  Weight: 73.1 kg (161 lb 3.2 oz)     Height:        Intake/Output Summary (Last 24 hours) at 07/17/2017 1617 Last data filed  at 07/17/2017 1000 Gross per 24 hour  Intake 360 ml  Output -  Net 360 ml   Filed Weights   07/15/17 0500 07/16/17 0650 07/17/17 0436  Weight: 73.2 kg (161 lb 6 oz) 73.2 kg (161 lb 6 oz) 73.1 kg (161 lb 3.2 oz)    Exam:  General: Sitting up in bed, in no obvious distress Respiratory: Good air movement bilaterally, no wheezing, minimal crackles at bases, no rhonchi, on 2 L oxygen, able to speak in full sentences, in no respiratory distress Cardiovascular: Regular rate and rhythm, no appreciable murmurs, no peripheral edema  Data Reviewed: CBC: Recent Labs  Lab 07/14/17 1859 07/15/17 0505 07/16/17 0834 07/17/17 0526  WBC 9.4 7.8 15.2* 10.0  NEUTROABS 8.4*  --   --   --   HGB 8.6* 7.9* 8.0* 7.6*  HCT 29.0* 26.1* 26.3* 24.7*  MCV 75.3* 74.6* 74.3* 73.5*  PLT 265 250 288 242   Basic Metabolic Panel: Recent Labs  Lab 07/14/17 1859 07/15/17 0505 07/17/17 0526  NA 141 140 139  K 3.7 3.8 3.3*  CL 104 106 103  CO2 27 26 28   GLUCOSE 203* 243* 147*  BUN 18 20 27*  CREATININE 0.78 0.75 0.80  CALCIUM 8.8* 8.7* 8.7*  MG  --   --  1.9   GFR: Estimated Creatinine Clearance: 86.3 mL/min (by C-G formula based on SCr of 0.8 mg/dL). Liver Function Tests: Recent Labs  Lab 07/14/17 1859 07/15/17 0505  AST 20 19  ALT 15* 13*  ALKPHOS 61 53  BILITOT 0.2* <0.1*  PROT 6.9 6.1*  ALBUMIN 4.0 3.3*   Recent Labs  Lab 07/14/17 1859  LIPASE 41   No results for input(s): AMMONIA in the last 168 hours. Coagulation Profile: Recent Labs  Lab 07/14/17 1859  INR 1.06   Cardiac Enzymes: Recent Labs  Lab 07/14/17 2248 07/15/17 0505 07/15/17 1033  TROPONINI <0.03 <0.03 <0.03   BNP (last 3 results) No results for input(s): PROBNP in the last 8760 hours. HbA1C: Recent Labs    07/15/17 0505  HGBA1C 8.2*   CBG: Recent Labs  Lab 07/16/17 1136 07/16/17 1608 07/16/17 2052 07/17/17 0811 07/17/17 1148  GLUCAP 203* 173* 209* 159* 142*   Lipid Profile: Recent Labs     07/15/17 0505  CHOL 119  HDL 52  LDLCALC 59  TRIG 39  CHOLHDL 2.3   Thyroid Function Tests: Recent Labs    07/16/17 0834  TSH 0.864  FREET4 0.78   Anemia Panel: No results for input(s): VITAMINB12, FOLATE, FERRITIN, TIBC, IRON, RETICCTPCT in the last 72 hours. Urine analysis:    Component Value Date/Time   COLORURINE YELLOW 05/02/2017 1427   APPEARANCEUR CLEAR 05/02/2017 1427   LABSPEC 1.010 05/02/2017 1427   PHURINE 6.5 05/02/2017 1427   GLUCOSEU 100 (A) 05/02/2017 1427   HGBUR MODERATE (A) 05/02/2017 1427   BILIRUBINUR NEGATIVE 05/02/2017 1427   KETONESUR NEGATIVE 05/02/2017 1427   PROTEINUR NEGATIVE 05/02/2017 1427   UROBILINOGEN 0.2 08/08/2012 1910   NITRITE NEGATIVE 05/02/2017 1427   LEUKOCYTESUR  NEGATIVE 05/02/2017 1427   Sepsis Labs: @LABRCNTIP (procalcitonin:4,lacticidven:4)  ) Recent Results (from the past 240 hour(s))  Respiratory Panel by PCR     Status: None   Collection Time: 07/16/17 12:29 PM  Result Value Ref Range Status   Adenovirus NOT DETECTED NOT DETECTED Final   Coronavirus 229E NOT DETECTED NOT DETECTED Final   Coronavirus HKU1 NOT DETECTED NOT DETECTED Final   Coronavirus NL63 NOT DETECTED NOT DETECTED Final   Coronavirus OC43 NOT DETECTED NOT DETECTED Final   Metapneumovirus NOT DETECTED NOT DETECTED Final   Rhinovirus / Enterovirus NOT DETECTED NOT DETECTED Final   Influenza A NOT DETECTED NOT DETECTED Final   Influenza B NOT DETECTED NOT DETECTED Final   Parainfluenza Virus 1 NOT DETECTED NOT DETECTED Final   Parainfluenza Virus 2 NOT DETECTED NOT DETECTED Final   Parainfluenza Virus 3 NOT DETECTED NOT DETECTED Final   Parainfluenza Virus 4 NOT DETECTED NOT DETECTED Final   Respiratory Syncytial Virus NOT DETECTED NOT DETECTED Final   Bordetella pertussis NOT DETECTED NOT DETECTED Final   Chlamydophila pneumoniae NOT DETECTED NOT DETECTED Final   Mycoplasma pneumoniae NOT DETECTED NOT DETECTED Final    Comment: Performed at [facility], [address] [place], [place] [address]      Studies: No results found.  Scheduled Meds: . arformoterol  15 mcg Nebulization Q12H  . benzonatate  100 mg Oral TID  . [START ON 07/18/2017] diltiazem  300 mg Oral Daily  . doxycycline  100 mg Oral Q12H  . feeding supplement (ENSURE ENLIVE)  237 mL Oral TID BM  . gabapentin  100 mg Oral BID  . guaiFENesin  600 mg Oral BID  . hydrocortisone   Rectal BID  . insulin aspart  0-9 Units Subcutaneous TID WC  . ipratropium  0.5 mg Nebulization QID  . montelukast  5 mg Oral QHS  . pantoprazole  40 mg Oral Daily  . pramipexole  0.125 mg Oral QHS  . predniSONE  40 mg Oral Q breakfast  . rosuvastatin  20 mg Oral QHS  . saccharomyces boulardii  250 mg Oral BID  . tamsulosin  0.4 mg Oral QPC supper    Continuous Infusions:    LOS: 2 days     John PeaceShayla D Nettey, MD [facility] Pager [phone]  If 7PM-7AM, please contact night-coverage [email or web address] Password [email or web address] 07/17/2017, 4:17 PM

## 2017-07-17 NOTE — Progress Notes (Signed)
Progress Note  Patient Name: John Marsh Date of Encounter: 07/17/2017  Primary Cardiologist: Dr. Katrinka BlazingSmith  Subjective   No dyspnea at rest but continues to report severe SOB with exertion even with minimal activity of getting up to go to the bathroom (again, present for just under a year). No further CP.  Inpatient Medications    Scheduled Meds: . apixaban  5 mg Oral BID  . benzonatate  100 mg Oral TID  . diltiazem  360 mg Oral Daily  . doxycycline  100 mg Oral Q12H  . feeding supplement (ENSURE ENLIVE)  237 mL Oral TID BM  . gabapentin  100 mg Oral BID  . guaiFENesin  600 mg Oral BID  . hydrocortisone   Rectal BID  . insulin aspart  0-9 Units Subcutaneous TID WC  . ipratropium-albuterol  3 mL Nebulization QID  . montelukast  5 mg Oral QHS  . pantoprazole  40 mg Oral Daily  . pramipexole  0.125 mg Oral QHS  . predniSONE  40 mg Oral Q breakfast  . rosuvastatin  20 mg Oral QHS  . saccharomyces boulardii  250 mg Oral BID  . tamsulosin  0.4 mg Oral QPC supper   Continuous Infusions:  PRN Meds: acetaminophen **OR** acetaminophen, albuterol, fluticasone, sodium chloride   Vital Signs    Vitals:   07/16/17 1954 07/16/17 2045 07/17/17 0436 07/17/17 0758  BP:  126/64 (!) 118/56   Pulse:  78 76 73  Resp:  20 18 18   Temp:  98.3 F (36.8 C) 98.1 F (36.7 C)   TempSrc:  Oral Oral   SpO2: 99% 100% 100% 93%  Weight:   161 lb 3.2 oz (73.1 kg)   Height:        Intake/Output Summary (Last 24 hours) at 07/17/2017 1106 Last data filed at 07/16/2017 1925 Gross per 24 hour  Intake 480 ml  Output -  Net 480 ml   Filed Weights   07/15/17 0500 07/16/17 0650 07/17/17 0436  Weight: 161 lb 6 oz (73.2 kg) 161 lb 6 oz (73.2 kg) 161 lb 3.2 oz (73.1 kg)    Telemetry    NSR - Personally Reviewed  Physical Exam   GEN: No acute distress.  HEENT: Normocephalic, atraumatic, sclera non-icteric. Neck: No JVD or bruits. Cardiac: RRR with distant heart sounds no murmurs, rubs, or  gallops.  Radials/DP/PT 1+ and equal bilaterally.  Respiratory: Decreased BS throughout but no wheezing today. Breathing is unlabored. GI: Soft, nontender, non-distended, BS +x 4. MS: no deformity. Extremities: No clubbing or cyanosis. No edema. Distal pedal pulses are 2+ and equal bilaterally. Neuro:  AAOx3. Follows commands. Psych:  Responds to questions appropriately with a normal affect.  Labs    Chemistry Recent Labs  Lab 07/14/17 1859 07/15/17 0505 07/17/17 0526  NA 141 140 139  K 3.7 3.8 3.3*  CL 104 106 103  CO2 27 26 28   GLUCOSE 203* 243* 147*  BUN 18 20 27*  CREATININE 0.78 0.75 0.80  CALCIUM 8.8* 8.7* 8.7*  PROT 6.9 6.1*  --   ALBUMIN 4.0 3.3*  --   AST 20 19  --   ALT 15* 13*  --   ALKPHOS 61 53  --   BILITOT 0.2* <0.1*  --   GFRNONAA >60 >60 >60  GFRAA >60 >60 >60  ANIONGAP 10 8 8      Hematology Recent Labs  Lab 07/15/17 0505 07/16/17 0834 07/17/17 0526  WBC 7.8 15.2* 10.0  RBC 3.50*  3.54* 3.36*  HGB 7.9* 8.0* 7.6*  HCT 26.1* 26.3* 24.7*  MCV 74.6* 74.3* 73.5*  MCH 22.6* 22.6* 22.6*  MCHC 30.3 30.4 30.8  RDW 19.7* 19.6* 19.2*  PLT 250 288 242    Cardiac Enzymes Recent Labs  Lab 07/14/17 2248 07/15/17 0505 07/15/17 1033  TROPONINI <0.03 <0.03 <0.03    Recent Labs  Lab 07/14/17 1910  TROPIPOC 0.00     BNP Recent Labs  Lab 07/16/17 0834  BNP 239.6*     DDimer  Recent Labs  Lab 07/14/17 1859  DDIMER 0.30     Radiology    No results found.  Cardiac Studies   2D Echo 07/15/17 Study Conclusions - Left ventricle: The cavity size was normal. Systolic function was normal. The estimated ejection fraction was in the range of 60% to 65%. Wall motion was normal; there were no regional wall motion abnormalities. Left ventricular diastolic function parameters were normal. - Aortic valve: There was very mild stenosis. Valve area (Vmax): 1.82 cm^2.    Patient Profile     72 y.o. male with COPD, chronic  respiratory failure on 3L home oxygen, HTN, HLD, GERD, PVD (details unknown), CAD (remote stenting 2007, further details unavailable), tobacco use,pulm nodules by CT 04/2017, DM (by labs 06/2017) and recently diagnosed PAF vs flutter. He was recently in the hospital 04/2017 with increased SOB/CP in setting of acute PNA, COPD exacerbation and sepsis.During that admission he developedatrial fibrillation with RVR (?later notes suggest atrial flutter only). He was also noted to be anemic with Hgb in 8 range (down from 13 in 09/2016); FOBT negative, studies suggestive of iron deficiency. GI Dr. Georgiann Cocker followed - CTAbd/pelvis neg for [facility]; outpt GI eval recommended. He was readmitted with continued/worsening SOB (x 1 year) as well as chest tightness (NSR on adm).  Assessment & Plan    1. Chest discomfort/dyspnea on exertion suspected to be due at least in part to symptomatic anemia - suspect multifactorial process in the setting of known CAD/COPD with low reserve and persistent anemia which is new compared to 09/2016. The anemia coincides with the patient's reported intolerance to even minimal activity. We feel the patient requires inpatient workup of his anemia. This was not previously pursued in 04/2017 as he was felt too unstable with his acute sepsis and afib at that time -now he appears as stable as he will likely be. He also notes unintentional weight loss but reports this is due to poor appetite which is certainly concerning. We cannot proceed with cardiac testing as we cannot act on the result until we know it is safe to commit to even further blood thinner therapy (I.e. If he has cath and needs stenting, we would not feel comfortable committing to dual antiplatelet therapy in addition to Eliquis without definitive evaluation of anemia). His wheezing is better, so his COPD is being treated adequately. Dr. Elease Hashimoto also suggested a trial of blood transfusion yesterday. Have spoken with IM to confer our  recommendations.  2. CAD s/p remote stenting 2007 - ASA/Plavix recently stopped due to initiation of Eliquis. See above. Needs formal evaluation of anemia prior to proceeding with ischemic workup. No BB with wheezing. Continue statin. Recent LDL 59.  3. Paroxysmal atrial fib vs flutter per notes 04/2017 - maintaining NSR this admission. BP softer at times. He does report some dizziness upon standing (may also be related to anemia). Will reduce diltiazem to 300mg  daily. Ultimately would recommend OP EP eval per prior cardiology notes as if  they feel this represented all atrial flutter (rather than any fib), RFA could be considered given anemia and ongoing issue of anticoagulation. As he has maintained NSR, he would be acceptable risk to hold anticoagulation prior to endoscopy if needed.  4. Hypokalemia - rx potassium chloride x 1. BMET in AM.  For questions or updates, please contact [facility] Please consult [email or web address] for contact info under Cardiology/STEMI.  Signed, Laurann Montana, PA-C 07/17/2017, 11:06 AM    Attending Note:   The patient was seen and examined.  Agree with assessment and plan as noted above.  Changes made to the above note as needed.  Patient seen and independently examined with  Ronie Spies, PA .   We discussed all aspects of the encounter. I agree with the assessment and plan as stated above.  1. CHest pain :   His symptoms seem to correlate with his anemia I think this needs to be addressed first. I suspect his CP will be considerably better once his Hb is up > 10 This anemia is relatively new for him ( for the past year )   2.  Paroxysmal atrial fib :   Stable    I have spent a total of 40 minutes with patient reviewing hospital  notes , telemetry, EKGs, labs and examining patient as well as establishing an assessment and plan that was discussed with the patient. > 50% of time was spent in direct patient care.    Vesta Mixer, Montez Hageman., MD,  [facility] 07/17/2017, 2:09 PM [address],  [address] Office [phone] Pager [phone]

## 2017-07-18 DIAGNOSIS — I10 Essential (primary) hypertension: Secondary | ICD-10-CM

## 2017-07-18 DIAGNOSIS — Z791 Long term (current) use of non-steroidal anti-inflammatories (NSAID): Secondary | ICD-10-CM

## 2017-07-18 LAB — BASIC METABOLIC PANEL
Anion gap: 8 (ref 5–15)
BUN: 24 mg/dL — AB (ref 6–20)
CO2: 30 mmol/L (ref 22–32)
Calcium: 9 mg/dL (ref 8.9–10.3)
Chloride: 101 mmol/L (ref 101–111)
Creatinine, Ser: 0.81 mg/dL (ref 0.61–1.24)
GFR calc Af Amer: >60 mL/min (ref >60)
GLUCOSE: 153 mg/dL — AB (ref 65–99)
POTASSIUM: 3.6 mmol/L (ref 3.5–5.1)
Sodium: 139 mmol/L (ref 135–145)

## 2017-07-18 LAB — CBC
HEMATOCRIT: 27.5 % — AB (ref 39.0–52.0)
Hemoglobin: 8.3 g/dL — ABNORMAL LOW (ref 13.0–17.0)
MCH: 22.2 pg — ABNORMAL LOW (ref 26.0–34.0)
MCHC: 30.2 g/dL (ref 30.0–36.0)
MCV: 73.5 fL — ABNORMAL LOW (ref 78.0–100.0)
Platelets: 245 10*3/uL (ref 150–400)
RBC: 3.74 MIL/uL — ABNORMAL LOW (ref 4.22–5.81)
RDW: 18.8 % — AB (ref 11.5–15.5)
WBC: 9.2 10*3/uL (ref 4.0–10.5)

## 2017-07-18 LAB — GLUCOSE, CAPILLARY
Glucose-Capillary: 136 mg/dL — ABNORMAL HIGH (ref 65–99)
Glucose-Capillary: 243 mg/dL — ABNORMAL HIGH (ref 65–99)
Glucose-Capillary: 228 mg/dL — ABNORMAL HIGH (ref 65–99)
Glucose-Capillary: 95 mg/dL (ref 65–99)

## 2017-07-18 MED ORDER — PEG-KCL-NACL-NASULF-NA ASC-C 100 G PO SOLR
0.5000 | Freq: Once | ORAL | Status: AC
  Administered 2017-07-18: 100 g via ORAL
  Filled 2017-07-18: qty 1

## 2017-07-18 MED ORDER — POTASSIUM CHLORIDE CRYS ER 20 MEQ PO TBCR
20.0000 meq | EXTENDED_RELEASE_TABLET | Freq: Every day | ORAL | Status: DC
  Administered 2017-07-18 – 2017-07-19 (×2): 20 meq via ORAL
  Filled 2017-07-18: qty 1

## 2017-07-18 MED ORDER — PEG-KCL-NACL-NASULF-NA ASC-C 100 G PO SOLR: 1.0000 | Freq: Once | ORAL | Status: DC

## 2017-07-18 NOTE — Care Management Important Message (Signed)
Important Message  Patient Details  Name: John Marsh MRN: [id number] Date of Birth: [birth year]   Medicare Important Message Given:  Yes    Caren MacadamFuller, Adrielle Polakowski 07/18/2017, 10:40 AMImportant Message  Patient Details  Name: John Marsh MRN: [id number] Date of Birth: [birth year]   Medicare Important Message Given:  Yes    Caren MacadamFuller, Carnesha Maravilla 07/18/2017, 10:39 AM

## 2017-07-18 NOTE — Progress Notes (Signed)
Progress Note  Patient Name: John Marsh Date of Encounter: 07/18/2017  Primary Cardiologist: Dr. Katrinka Blazing  Subjective   Breathing continues to feel good at rest; still +DOE. Eliquis now on hold, pending Gi studies 1/31.  Inpatient Medications    Scheduled Meds: . arformoterol  15 mcg Nebulization Q12H  . benzonatate  100 mg Oral TID  . diltiazem  300 mg Oral Daily  . doxycycline  100 mg Oral Q12H  . feeding supplement (ENSURE ENLIVE)  237 mL Oral TID BM  . gabapentin  100 mg Oral BID  . guaiFENesin  600 mg Oral BID  . hydrocortisone   Rectal BID  . insulin aspart  0-9 Units Subcutaneous TID WC  . ipratropium  0.5 mg Nebulization QID  . montelukast  5 mg Oral QHS  . pantoprazole  40 mg Oral Daily  . pramipexole  0.125 mg Oral QHS  . predniSONE  40 mg Oral Q breakfast  . rosuvastatin  20 mg Oral QHS  . saccharomyces boulardii  250 mg Oral BID  . tamsulosin  0.4 mg Oral QPC supper   Continuous Infusions:  PRN Meds: acetaminophen **OR** acetaminophen, albuterol, fluticasone, sodium chloride   Vital Signs    Vitals:   07/17/17 2021 07/17/17 2132 07/18/17 0500 07/18/17 0516  BP:  (!) 122/52  116/60  Pulse:  76  66  Resp:  18  17  Temp:  97.8 F (36.6 C)  98 F (36.7 C)  TempSrc:  Oral  Oral  SpO2: 97% 100%  100%  Weight:   157 lb 10.1 oz (71.5 kg)   Height:        Intake/Output Summary (Last 24 hours) at 07/18/2017 0751 Last data filed at 07/18/2017 0200 Gross per 24 hour  Intake 360 ml  Output -  Net 360 ml   Filed Weights   07/16/17 0650 07/17/17 0436 07/18/17 0500  Weight: 161 lb 6 oz (73.2 kg) 161 lb 3.2 oz (73.1 kg) 157 lb 10.1 oz (71.5 kg)    Telemetry    NSR - Personally Reviewed  Physical Exam   GEN: No acute distress.  HEENT: Normocephalic, atraumatic, sclera non-icteric. Neck: No JVD or bruits. Cardiac: RRR distant heart sounds no murmurs, rubs, or gallops.  Radials/DP/PT 1+ and equal bilaterally.  Respiratory: Diminished throughout but  otherwise no wheezing, rales or rhonchi. Breathing is unlabored. GI: Soft, nontender, non-distended, BS +x 4. MS: no deformity. Extremities: No clubbing or cyanosis. No edema. Distal pedal pulses are 2+ and equal bilaterally. Neuro:  AAOx3. Follows commands. Psych:  Responds to questions appropriately with a normal affect.  Labs    Chemistry Recent Labs  Lab 07/14/17 1859 07/15/17 0505 07/17/17 0526 07/18/17 0538  NA 141 140 139 139  K 3.7 3.8 3.3* 3.6  CL 104 106 103 101  CO2 27 26 28 30   GLUCOSE 203* 243* 147* 153*  BUN 18 20 27* 24*  CREATININE 0.78 0.75 0.80 0.81  CALCIUM 8.8* 8.7* 8.7* 9.0  PROT 6.9 6.1*  --   --   ALBUMIN 4.0 3.3*  --   --   AST 20 19  --   --   ALT 15* 13*  --   --   ALKPHOS 61 53  --   --   BILITOT 0.2* <0.1*  --   --   GFRNONAA >60 >60 >60 >60  GFRAA >60 >60 >60 >60  ANIONGAP 10 8 8 8      Hematology Recent Labs  Lab 07/16/17 0834 07/17/17 0526 07/18/17 0538  WBC 15.2* 10.0 9.2  RBC 3.54* 3.36* 3.74*  HGB 8.0* 7.6* 8.3*  HCT 26.3* 24.7* 27.5*  MCV 74.3* 73.5* 73.5*  MCH 22.6* 22.6* 22.2*  MCHC 30.4 30.8 30.2  RDW 19.6* 19.2* 18.8*  PLT 288 242 245    Cardiac Enzymes Recent Labs  Lab 07/14/17 2248 07/15/17 0505 07/15/17 1033  TROPONINI <0.03 <0.03 <0.03    Recent Labs  Lab 07/14/17 1910  TROPIPOC 0.00     BNP Recent Labs  Lab 07/16/17 0834  BNP 239.6*     DDimer  Recent Labs  Lab 07/14/17 1859  DDIMER 0.30     Radiology    No results found.  Cardiac Studies   2D Echo 07/15/17 Study Conclusions - Left ventricle: The cavity size was normal. Systolic function was normal. The estimated ejection fraction was in the range of 60% to 65%. Wall motion was normal; there were no regional wall motion abnormalities. Left ventricular diastolic function parameters were normal. - Aortic valve: There was very mild stenosis. Valve area (Vmax): 1.82 cm^2.    Patient Profile     72 y.o. male with COPD,  chronic respiratory failure on 3L home oxygen, HTN, HLD, GERD, PVD(details unknown), CAD(remote stenting 2007, further details unavailable), tobacco use,pulm nodules by CT 04/2017, DM (by labs 06/2017)andrecently diagnosedPAFvs flutter. He was recently in the hospital11/2018with increased SOB/CP in setting of acute PNA, COPD exacerbation and sepsis.During that admission he developedatrial fibrillation with RVR(?later notes suggest atrial flutter only). He was also noted to be anemic with Hgb in 8 range (down from 13 in 09/2016); FOBT negative, studies suggestive of iron deficiency. GI Dr. Georgiann CockerBrahmbatt [facility]pt [facility] eval recommended. He was readmitted with continued/worsening SOB (x 1 year) as well as chest tightness (NSR on adm).  Assessment & Plan    1. Chest discomfort/dyspnea on exertion suspected to be due at least in part to symptomatic anemia- suspect multifactorial process in the setting of known CAD/COPD with low reserve and persistent anemia which is new compared to 09/2016. The anemia coincides with the patient's reported intolerance to any activity. Have recommended GI workup as inpatient which is pending - patient is to have endoscopy/colonscopy this admission. We can base plans regarding ischemic workup based on these findings.  2. CADs/p remote stenting 2007 - as above. No BB with recent wheezing/AECOPD - could be considered in future but he's also had orthostasis-type dizziness recently with his anemia so would hold off. Continue statin. Recent LDL 59.  3. Paroxysmal atrial fib vs flutterper notes 04/2017 - maintaining NSR this admission; diltiazem reduced due to softer BP and reports of mild orthostasis. Follow. Ultimately would recommend OP EP eval per prior cardiology notes as if they feel this represented all atrial flutter (rather than any fib), RFA could be considered given anemia and ongoing issue of anticoagulation.   4. Hypokalemia -  improved but still borderline. Start KCl 20meq daily.  5. Mild AS - will need annual f/u.  For questions or updates, please contact [facility] Please consult [email or web address] for contact info under Cardiology/STEMI.  Signed, Laurann Montanaayna N Dunn, PA-C 07/18/2017, 7:51 AM    Attending Note:   The patient was seen and examined.  Agree with assessment and plan as noted above.  Changes made to the above note as needed.  Patient seen and independently examined with  Lucile Craterana Dunn, PA .   We discussed all aspects of the encounter.  I agree with the assessment and plan as stated above.  1.  Chest pain/shortness of breath.  I suspect that this is largely due to his anemia. Further workup of his anemia is in progress. He is stable from a cardiac standpoint. He is at low risk for EGD and colonoscopy.   2.  Coronary artery disease-status post stenting in 2007:  He is to be stable at present.  3.  Paroxysmal atrial fibrillation: He is maintaining normal sinus rhythm.  Anticoagulation is on hold because of his progressive anemia.  We will sign off. Call for questions He will need to see [place] a week or so after DC . He will need Eliquis restarted when OK with GI .    I have spent a total of 40 minutes with patient reviewing hospital  notes , telemetry, EKGs, labs and examining patient as well as establishing an assessment and plan that was discussed with the patient. > 50% of time was spent in direct patient care.    Vesta Mixer, Montez Hageman., MD, [facility] 07/18/2017, 11:42 AM [address]  [address] Office [phone] Pager [phone]

## 2017-07-18 NOTE — Progress Notes (Signed)
PROGRESS NOTE    John Marsh  NOB:[id number] DOB: 1945/10/02 DOA: 07/14/2017 PCP: Chesley Noon, MD      Brief Narrative:  John Marsh is a 72 y.o. year old male with medical history significant for COPD on oxygen (2 L rest, 3 w/exertion), Af on xarelto, CAD, GERDPAD, HTN who presented on 07/14/2017 with chest pain.     Assessment & Plan:  Principal Problem:   Chest pain Active Problems:   Diabetes mellitus without complication (HCC)   HLD (hyperlipidemia)   Essential hypertension   Shortness of breath   COPD with acute exacerbation (HCC)   Anemia   Coronary artery disease due to lipid rich plaque   Dyspnea   COPD exacerbation -Continue prednisone 40 daily -Continue doxycycline -Continue Tessalon -Continue Brovana -Continue Singulair, Atrovent, Mucinex, Flonase -Resume Pulmicort at discharge   Chest pain Coronary artery disease Rule out for ACS.   -Cardiology consulted, they have evaluated and signed off -No further ischemic workup during his hospitalization -Needs cardiology follow-up 1 week after discharge  Iron deficiency anemia Versus chronic blood loss anemia presumably.  Source unclear. -Endoscopy tomorrow -Hold Eliquis  Type 2 diabetes -Continue SSI  Atrial flutter -Restart Eliquis when cleared by GI -Continue diltiazem  Hypokalemia -Continue potassium supplement  Other medications: -Continue tamsulosin, statin, PPI, Mirapex       DVT prophylaxis: SCDs Code Status: FULL Family Communication: None  Disposition Plan: Endoscopy tomorrow.  If endoscopy normal, home after.   Consultants:   GI  Cardiology    Subjective: Feels "better than I have in months" in terms of breathing.  Wheezing improved.  No edema, fever, sputum production, confusion.  Objective: Vitals:   07/17/17 2021 07/17/17 2132 07/18/17 0500 07/18/17 0516  BP:  (!) 122/52  116/60  Pulse:  76  66  Resp:  18  17  Temp:  97.8 F (36.6 C)  98 F (36.7 C)    TempSrc:  Oral  Oral  SpO2: 97% 100%  100%  Weight:   71.5 kg (157 lb 10.1 oz)   Height:        Intake/Output Summary (Last 24 hours) at 07/18/2017 1228 Last data filed at 07/18/2017 1000 Gross per 24 hour  Intake 360 ml  Output -  Net 360 ml   Filed Weights   07/16/17 0650 07/17/17 0436 07/18/17 0500  Weight: 73.2 kg (161 lb 6 oz) 73.1 kg (161 lb 3.2 oz) 71.5 kg (157 lb 10.1 oz)    Examination: General appearance:  adult male, alert and in no acute distress.   HEENT: Anicteric, conjunctiva pink, lids and lashes normal. No nasal deformity, discharge, epistaxis.  Lips moist.   Skin: Warm and dry.  No jaundice.  No suspicious rashes or lesions. Cardiac: RRR, nl S1-S2, no murmurs appreciated.  Capillary refill is brisk.  JVP normal.  No LE edema.  Respiratory: Normal respiratory rate and rhythm.  Wheezing present, no rales. Abdomen: Abdomen soft.  No TTP.  MSK: No deformities or effusions. Neuro: Awake and alert.  EOMI, moves all extremities. Speech fluent.    Psych: Sensorium intact and responding to questions, attention normal. Affect angry.  Judgment and insight appear normal.    Data Reviewed: I have personally reviewed following labs and imaging studies:  CBC: Recent Labs  Lab 07/14/17 1859 07/15/17 0505 07/16/17 0834 07/17/17 0526 07/18/17 0538  WBC 9.4 7.8 15.2* 10.0 9.2  NEUTROABS 8.4*  --   --   --   --  HGB 8.6* 7.9* 8.0* 7.6* 8.3*  HCT 29.0* 26.1* 26.3* 24.7* 27.5*  MCV 75.3* 74.6* 74.3* 73.5* 73.5*  PLT 265 250 288 242 409   Basic Metabolic Panel: Recent Labs  Lab 07/14/17 1859 07/15/17 0505 07/17/17 0526 07/18/17 0538  NA 141 140 139 139  K 3.7 3.8 3.3* 3.6  CL 104 106 103 101  CO2 _0 GLUCOSE 203* 243* 147* 153*  BUN 18 20 27* 24*  CREATININE 0.78 0.75 0.80 0.81  CALCIUM 8.8* 8.7* 8.7* 9.0  MG  --   --  1.9  --    GFR: Estimated Creatinine Clearance: 83.4 mL/min (by C-G formula based on SCr of 0.81 mg/dL). Liver Function  Tests: Recent Labs  Lab 07/14/17 1859 07/15/17 0505  AST 20 19  ALT 15* 13*  ALKPHOS 61 53  BILITOT 0.2* <0.1*  PROT 6.9 6.1*  ALBUMIN 4.0 3.3*   Recent Labs  Lab 07/14/17 1859  LIPASE 41   No results for input(s): AMMONIA in the last 168 hours. Coagulation Profile: Recent Labs  Lab 07/14/17 1859  INR 1.06   Cardiac Enzymes: Recent Labs  Lab 07/14/17 2248 07/15/17 0505 07/15/17 1033  TROPONINI <0.03 <0.03 <0.03   BNP (last 3 results) No results for input(s): PROBNP in the last 8760 hours. HbA1C: No results for input(s): HGBA1C in the last 72 hours. CBG: Recent Labs  Lab 07/17/17 1148 07/17/17 1658 07/17/17 2209 07/18/17 0801 07/18/17 1138  GLUCAP 142* 247* 211* 136* 243*   Lipid Profile: No results for input(s): CHOL, HDL, LDLCALC, TRIG, CHOLHDL, LDLDIRECT in the last 72 hours. Thyroid Function Tests: Recent Labs    07/16/17 0834  TSH 0.864  FREET4 0.78   Anemia Panel: No results for input(s): VITAMINB12, FOLATE, FERRITIN, TIBC, IRON, RETICCTPCT in the last 72 hours. Urine analysis:    Component Value Date/Time   COLORURINE YELLOW 05/02/2017 1427   APPEARANCEUR CLEAR 05/02/2017 1427   LABSPEC 1.010 05/02/2017 1427   PHURINE 6.5 05/02/2017 1427   GLUCOSEU 100 (A) 05/02/2017 1427   HGBUR MODERATE (A) 05/02/2017 1427   BILIRUBINUR NEGATIVE 05/02/2017 1427   KETONESUR NEGATIVE 05/02/2017 1427   PROTEINUR NEGATIVE 05/02/2017 1427   UROBILINOGEN 0.2 08/08/2012 1910   NITRITE NEGATIVE 05/02/2017 1427   LEUKOCYTESUR NEGATIVE 05/02/2017 1427   Sepsis Labs: _1 (procalcitonin:4,lacticacidven:4)  ) Recent Results (from the past 240 hour(s))  Respiratory Panel by PCR     Status: None   Collection Time: 07/16/17 12:29 PM  Result Value Ref Range Status   Adenovirus NOT DETECTED NOT DETECTED Final   Coronavirus 229E NOT DETECTED NOT DETECTED Final   Coronavirus HKU1 NOT DETECTED NOT DETECTED Final   Coronavirus NL63 NOT DETECTED NOT DETECTED  Final   Coronavirus OC43 NOT DETECTED NOT DETECTED Final   Metapneumovirus NOT DETECTED NOT DETECTED Final   Rhinovirus / Enterovirus NOT DETECTED NOT DETECTED Final   Influenza A NOT DETECTED NOT DETECTED Final   Influenza B NOT DETECTED NOT DETECTED Final   Parainfluenza Virus 1 NOT DETECTED NOT DETECTED Final   Parainfluenza Virus 2 NOT DETECTED NOT DETECTED Final   Parainfluenza Virus 3 NOT DETECTED NOT DETECTED Final   Parainfluenza Virus 4 NOT DETECTED NOT DETECTED Final   Respiratory Syncytial Virus NOT DETECTED NOT DETECTED Final   Bordetella pertussis NOT DETECTED NOT DETECTED Final   Chlamydophila pneumoniae NOT DETECTED NOT DETECTED Final   Mycoplasma pneumoniae NOT DETECTED NOT DETECTED Final    Comment: Performed at [facility]  Lab, [address] [place], [place] [address]         Radiology Studies: No results found.      Scheduled Meds: . arformoterol  15 mcg Nebulization Q12H  . benzonatate  100 mg Oral TID  . diltiazem  300 mg Oral Daily  . doxycycline  100 mg Oral Q12H  . feeding supplement (ENSURE ENLIVE)  237 mL Oral TID BM  . gabapentin  100 mg Oral BID  . guaiFENesin  600 mg Oral BID  . hydrocortisone   Rectal BID  . insulin aspart  0-9 Units Subcutaneous TID WC  . ipratropium  0.5 mg Nebulization QID  . montelukast  5 mg Oral QHS  . pantoprazole  40 mg Oral Daily  . peg 3350 powder  0.5 kit Oral Once   And  . peg 3350 powder  0.5 kit Oral Once  . potassium chloride  20 mEq Oral Daily  . pramipexole  0.125 mg Oral QHS  . predniSONE  40 mg Oral Q breakfast  . rosuvastatin  20 mg Oral QHS  . saccharomyces boulardii  250 mg Oral BID  . tamsulosin  0.4 mg Oral QPC supper   Continuous Infusions:   LOS: 3 days    Time spent: 40 minutes    Edwin Dada, MD [facility] 07/18/2017, 12:28 PM     Pager [phone] --- please page though AMION:  [email or web address] Password [email or web address] If 7PM-7AM, please contact  night-coverage

## 2017-07-18 NOTE — Progress Notes (Signed)
Patient is currently upset and is wanting to "calm down". RN at bedside attempting to help patient, but patient is preferring to spend time alone at this time. Patient encouraged to let RN know when he is ready for his breathing medications.

## 2017-07-18 NOTE — Progress Notes (Signed)
Patient ID: John Marsh, male   DOB: 02/09/1946, [age] y.o.   MRN: [id number]    Progress Note   Subjective   Was very upset earlier - says a Dr  came in this am , and told him he was being discharged today .Marland Kitchen. He says breathing is better, no CP, thinks he can tolerate bowel prep today    Objective   Vital signs in last 24 hours: Temp:  [97.8 F (36.6 C)-98.2 F (36.8 C)] 98 F (36.7 C) (01/30 0516) Pulse Rate:  [66-87] 66 (01/30 0516) Resp:  [17-18] 17 (01/30 0516) BP: (111-122)/(52-60) 116/60 (01/30 0516) SpO2:  [96 %-100 %] 100 % (01/30 0516) Weight:  [157 lb 10.1 oz (71.5 kg)] 157 lb 10.1 oz (71.5 kg) (01/30 0500) Last BM Date: 07/14/17 General:    Older WM  in NAD- on O2  Heart:  Regular rate and rhythm; no murmurs Lungs: Respirations even , decreased bilaterally Abdomen:  Soft, nontender and nondistended. Normal bowel sounds. Extremities:  Without edema. Neurologic:  Alert and oriented,  grossly normal neurologically. Psych:  Cooperative. Normal mood and affect.  Lab Results: Recent Labs    07/16/17 0834 07/17/17 0526 07/18/17 0538  WBC 15.2* 10.0 9.2  HGB 8.0* 7.6* 8.3*  HCT 26.3* 24.7* 27.5*  PLT 288 242 245   BMET Recent Labs    07/17/17 0526 07/18/17 0538  NA 139 139  K 3.3* 3.6  CL 103 101  CO2 28 30  GLUCOSE 147* 153*  BUN 27* 24*  CREATININE 0.80 0.81  CALCIUM 8.7* 9.0      Assessment / Plan:    #1 72 yo WM with Fe deficiency anemia , weight loss, poor appetite - was seen by GI for same during last admit and to ill tp proceed with endoscopic work up at that time . HGb stable at 8.3  Needs EGD and Colon - Eliquis is being held - plan for bowel prep today and Colon/EGD tomorrow with Dr Christella HartiganJacobs  #2 COPD -O2 dependent 3 l  #3 Chest pain - hx CAD  S/p remote stent - cardiology planning cath  This admit - need bowel cleared in event he needs stents etc and dual antiplatelet rx   Contact  Amy Esterwood, P.A.-C               [phone]  [phone]   ________________________________________________________________________  [facility] MD note:  I personally examined the patient, reviewed the data and agree with the assessment and plan described above.  Planning on colonoscopy/EGD tomorrow for significant IDA, extreme NSAID use up until a few weeks ago may contribute but need to check for other potential causes (neoplasm).  Rob Buntinganiel Daziah Hesler, MD [facility] Pager [phone]

## 2017-07-18 NOTE — H&P (View-Only) (Signed)
Patient ID: John Marsh, male   DOB: 05/03/1946, 72 y.o.   MRN: [id number]    Progress Note   Subjective   Was very upset earlier - says a Dr  came in this am , and told him he was being discharged today .. He says breathing is better, no CP, thinks he can tolerate bowel prep today    Objective   Vital signs in last 24 hours: Temp:  [97.8 F (36.6 C)-98.2 F (36.8 C)] 98 F (36.7 C) (01/30 0516) Pulse Rate:  [66-87] 66 (01/30 0516) Resp:  [17-18] 17 (01/30 0516) BP: (111-122)/(52-60) 116/60 (01/30 0516) SpO2:  [96 %-100 %] 100 % (01/30 0516) Weight:  [157 lb 10.1 oz (71.5 kg)] 157 lb 10.1 oz (71.5 kg) (01/30 0500) Last BM Date: 07/14/17 General:    Older WM  in NAD- on O2  Heart:  Regular rate and rhythm; no murmurs Lungs: Respirations even , decreased bilaterally Abdomen:  Soft, nontender and nondistended. Normal bowel sounds. Extremities:  Without edema. Neurologic:  Alert and oriented,  grossly normal neurologically. Psych:  Cooperative. Normal mood and affect.  Lab Results: Recent Labs    07/16/17 0834 07/17/17 0526 07/18/17 0538  WBC 15.2* 10.0 9.2  HGB 8.0* 7.6* 8.3*  HCT 26.3* 24.7* 27.5*  PLT 288 242 245   BMET Recent Labs    07/17/17 0526 07/18/17 0538  NA 139 139  K 3.3* 3.6  CL 103 101  CO2 28 30  GLUCOSE 147* 153*  BUN 27* 24*  CREATININE 0.80 0.81  CALCIUM 8.7* 9.0      Assessment / Plan:    #1 71 yo WM with Fe deficiency anemia , weight loss, poor appetite - was seen by GI for same during last admit and to ill tp proceed with endoscopic work up at that time . HGb stable at 8.3  Needs EGD and Colon - Eliquis is being held - plan for bowel prep today and Colon/EGD tomorrow with Dr Shaterica Mcclatchy  #2 COPD -O2 dependent 3 l  #3 Chest pain - hx CAD  S/p remote stent - cardiology planning cath  This admit - need bowel cleared in event he needs stents etc and dual antiplatelet rx   Contact  Amy Esterwood, P.A.-C               [phone]  [phone]   ________________________________________________________________________  [facility] MD note:  I personally examined the patient, reviewed the data and agree with the assessment and plan described above.  Planning on colonoscopy/EGD tomorrow for significant IDA, extreme NSAID use up until a few weeks ago may contribute but need to check for other potential causes (neoplasm).  Jamee Pacholski, MD [facility] Pager [phone]     

## 2017-07-19 ENCOUNTER — Inpatient Hospital Stay (HOSPITAL_COMMUNITY): Payer: Medicare Other | Admitting: Registered Nurse

## 2017-07-19 ENCOUNTER — Encounter (HOSPITAL_COMMUNITY): Payer: Self-pay | Admitting: *Deleted

## 2017-07-19 ENCOUNTER — Encounter (HOSPITAL_COMMUNITY)
Admission: EM | Disposition: A | Discharge status: Home Health | Payer: Self-pay | Source: Home / Self Care | Attending: Internal Medicine

## 2017-07-19 DIAGNOSIS — R933 Abnormal findings on diagnostic imaging of other parts of digestive tract: Secondary | ICD-10-CM

## 2017-07-19 HISTORY — PX: ESOPHAGOGASTRODUODENOSCOPY (EGD) WITH PROPOFOL: SHX5813

## 2017-07-19 HISTORY — PX: COLONOSCOPY WITH PROPOFOL: SHX5780

## 2017-07-19 LAB — BASIC METABOLIC PANEL
ANION GAP: 7 (ref 5–15)
BUN: 24 mg/dL — ABNORMAL HIGH (ref 6–20)
CHLORIDE: 105 mmol/L (ref 101–111)
CO2: 27 mmol/L (ref 22–32)
Calcium: 8.7 mg/dL — ABNORMAL LOW (ref 8.9–10.3)
Creatinine, Ser: 0.84 mg/dL (ref 0.61–1.24)
GFR calc non Af Amer: >60 mL/min (ref >60)
Glucose, Bld: 114 mg/dL — ABNORMAL HIGH (ref 65–99)
POTASSIUM: 3.2 mmol/L — AB (ref 3.5–5.1)
Sodium: 139 mmol/L (ref 135–145)

## 2017-07-19 LAB — GASTROINTESTINAL PANEL BY PCR, STOOL (REPLACES STOOL CULTURE)
Adenovirus F40/41: NOT DETECTED
Astrovirus: NOT DETECTED
CRYPTOSPORIDIUM: NOT DETECTED
Campylobacter species: NOT DETECTED
Cyclospora cayetanensis: NOT DETECTED
ENTEROAGGREGATIVE E COLI (EAEC): NOT DETECTED
Entamoeba histolytica: NOT DETECTED
Enteropathogenic E coli (EPEC): NOT DETECTED
Enterotoxigenic E coli (ETEC): NOT DETECTED
GIARDIA LAMBLIA: NOT DETECTED
Norovirus GI/GII: NOT DETECTED
Plesimonas shigelloides: NOT DETECTED
ROTAVIRUS A: NOT DETECTED
SALMONELLA SPECIES: NOT DETECTED
SAPOVIRUS (I, II, IV, AND V): NOT DETECTED
SHIGELLA/ENTEROINVASIVE E COLI (EIEC): NOT DETECTED
Shiga like toxin producing E coli (STEC): NOT DETECTED
Vibrio cholerae: NOT DETECTED
Vibrio species: NOT DETECTED
YERSINIA ENTEROCOLITICA: NOT DETECTED

## 2017-07-19 LAB — C DIFFICILE QUICK SCREEN W PCR REFLEX
C DIFFICILE (CDIFF) TOXIN: POSITIVE — AB
C Diff antigen: POSITIVE — AB
C Diff interpretation: DETECTED

## 2017-07-19 LAB — GLUCOSE, CAPILLARY
GLUCOSE-CAPILLARY: 161 mg/dL — AB (ref 65–99)
Glucose-Capillary: 117 mg/dL — ABNORMAL HIGH (ref 65–99)

## 2017-07-19 LAB — CBC
HCT: 27.8 % — ABNORMAL LOW (ref 39.0–52.0)
Hemoglobin: 8.4 g/dL — ABNORMAL LOW (ref 13.0–17.0)
MCH: 22.2 pg — AB (ref 26.0–34.0)
MCHC: 30.2 g/dL (ref 30.0–36.0)
MCV: 73.5 fL — AB (ref 78.0–100.0)
Platelets: 276 10*3/uL (ref 150–400)
RBC: 3.78 MIL/uL — AB (ref 4.22–5.81)
RDW: 18.3 % — ABNORMAL HIGH (ref 11.5–15.5)
WBC: 8.2 10*3/uL (ref 4.0–10.5)

## 2017-07-19 SURGERY — COLONOSCOPY WITH PROPOFOL
Anesthesia: Monitor Anesthesia Care

## 2017-07-19 MED ORDER — LACTATED RINGERS IV SOLN
INTRAVENOUS | Status: DC | PRN
  Administered 2017-07-19: 09:00:00 via INTRAVENOUS

## 2017-07-19 MED ORDER — LACTATED RINGERS IV SOLN
INTRAVENOUS | Status: DC
  Administered 2017-07-19: 08:00:00 via INTRAVENOUS

## 2017-07-19 MED ORDER — FLEET ENEMA 7-19 GM/118ML RE ENEM
2.0000 | ENEMA | Freq: Once | RECTAL | Status: AC
  Administered 2017-07-19: 2 via RECTAL

## 2017-07-19 MED ORDER — LIDOCAINE HCL (CARDIAC) 20 MG/ML IV SOLN
INTRAVENOUS | Status: DC | PRN
  Administered 2017-07-19: 50 mg via INTRAVENOUS

## 2017-07-19 MED ORDER — PROPOFOL 10 MG/ML IV BOLUS
INTRAVENOUS | Status: AC
  Filled 2017-07-19: qty 60

## 2017-07-19 MED ORDER — VANCOMYCIN 50 MG/ML ORAL SOLUTION
125.0000 mg | Freq: Four times a day (QID) | ORAL | Status: DC
  Administered 2017-07-19: 125 mg via ORAL
  Filled 2017-07-19 (×2): qty 2.5

## 2017-07-19 MED ORDER — VANCOMYCIN 50 MG/ML ORAL SOLUTION: 125.0000 mg | Freq: Four times a day (QID) | ORAL | 1 refills | Status: DC

## 2017-07-19 MED ORDER — FLEET ENEMA 7-19 GM/118ML RE ENEM
ENEMA | RECTAL | Status: AC
  Filled 2017-07-19: qty 2

## 2017-07-19 MED ORDER — PROPOFOL 500 MG/50ML IV EMUL
INTRAVENOUS | Status: DC | PRN
  Administered 2017-07-19: 250 ug/kg/min via INTRAVENOUS

## 2017-07-19 MED ORDER — DOXYCYCLINE HYCLATE 100 MG PO TABS: 100.0000 mg | ORAL_TABLET | Freq: Two times a day (BID) | ORAL | 0 refills | Status: DC

## 2017-07-19 SURGICAL SUPPLY — 25 items

## 2017-07-19 NOTE — Anesthesia Procedure Notes (Signed)
Procedure Name: MAC Date/Time: 07/19/2017 9:01 AM Performed by: Lissa Morales, CRNA Pre-anesthesia Checklist: Patient identified, Emergency Drugs available, Patient being monitored and Suction available Patient Re-evaluated:Patient Re-evaluated prior to induction Oxygen Delivery Method: Simple face mask Placement Confirmation: positive ETCO2

## 2017-07-19 NOTE — Discharge Summary (Signed)
Physician Discharge Summary  John Marsh:[id number] DOB: 1946-04-24 DOA: 07/14/2017  PCP: Eartha Inch, MD  Admit date: 07/14/2017 Discharge date: 07/19/2017  Admitted From: Home Disposition: Home Recommendations for Outpatient Follow-up:  1. Follow up with PCP in 1-2 weeks 2. Please obtain BMP/CBC in one week 3. Please follow up on the following pending results:biopsy duodenum  Home Health: Yes Equipment/Devices: None Discharge Condition: Stable CODE STATUS full code Diet recommendation cardiac diet Brief/Interim Summary: 72 y.o.year old malewith medical history significant for COPD on oxygen (2 L rest, 3 w/exertion), Af on xarelto, CAD, GERDPAD, HTN who presented on 1/26/2019with chest pain.     Discharge Diagnoses:  Principal Problem:   Chest pain Active Problems:   Diabetes mellitus without complication (HCC)   HLD (hyperlipidemia)   Essential hypertension   Shortness of breath   COPD with acute exacerbation (HCC)   Anemia   Coronary artery disease due to lipid rich plaque   Dyspnea   Abnormal colonoscopy 1] iron deficiency anemia-patient had EGD and colonoscopy done today.  Upper GI was normal.  Biopsy from the duodenum was taken to rule out celiac disease.  Otherwise there was no evidence of bleeding.  Colonoscopy also did not reveal any evidence of bleeding.  But it did show several areas of white patchy areas from which a biopsy was sent C. difficile was sent and C. difficile came back positive immediately.  He was started on vancomycin prior to discharge from the hospital.  He will continue on vancomycin and then follow-up with Dr. Christella Hartigan for the biopsy results.  The wife is to call Dr. Christella Hartigan office and make an appointment for follow-up.  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  2] history of CAD atrial fibrillation patient will follow up with cardiologist after discharge.  Will restart  Eliquis as there was no evidence of active GI bleed noted in the endoscopy.  3] lung nodule patient has known history of lung nodules in the past.  Patient is followed by Dr. Sharren Bridge with pulmonology and gets a CT scan of his chest done couple of times during the ER.  Patient will follow up with pulmonology after discharge for follow-up CT scans as an outpatient.  I have given him Dr. Kavin Leech name to call and make an appointment since Dr. Sharren Bridge has moved away from the area.  4] COPD exacerbation continue prednisone taper as well as doxycycline for 4 more days.  Discharge Instructions I discussed all the above findings with the patient with her wife present.  Discharge Instructions    Diet - low sodium heart healthy   Complete by:  As directed    Diet - low sodium heart healthy   Complete by:  As directed    Increase activity slowly   Complete by:  As directed    Increase activity slowly   Complete by:  As directed      Allergies as of 07/19/2017   No Known Allergies     Medication List    STOP taking these medications   levalbuterol 1.25 MG/0.5ML nebulizer solution Commonly known as:  XOPENEX     TAKE these medications   acetaminophen 325 MG tablet Commonly known as:  TYLENOL Take 650 mg by mouth every 6 (six) hours as needed for mild pain.   AEROCHAMBER Z-STAT PLUS inhaler Use as instructed   albuterol 108 (90 Base) MCG/ACT inhaler Commonly known as:  PROVENTIL HFA;VENTOLIN HFA  Inhale 2 puffs into the lungs every 6 (six) hours as needed for wheezing or shortness of breath.   apixaban 5 MG Tabs tablet Commonly known as:  ELIQUIS Take 1 tablet (5 mg total) 2 (two) times daily by mouth.   ATROVENT HFA 17 MCG/ACT inhaler Generic drug:  ipratropium Take 2 puffs by mouth 4 (four) times daily.   benzonatate 100 MG capsule Commonly known as:  TESSALON Take 1 capsule (100 mg total) 3 (three) times daily by mouth.   budesonide 0.5 MG/2ML nebulizer solution Commonly  known as:  PULMICORT Take 2 mLs (0.5 mg total) by nebulization 2 (two) times daily.   diltiazem 360 MG 24 hr capsule Commonly known as:  CARDIZEM CD Take 1 capsule (360 mg total) daily by mouth.   doxycycline 100 MG tablet Commonly known as:  VIBRA-TABS Take 1 tablet (100 mg total) by mouth every 12 (twelve) hours.   feeding supplement (ENSURE ENLIVE) Liqd Take 237 mLs by mouth 3 (three) times daily between meals.   fluticasone 50 MCG/ACT nasal spray Commonly known as:  FLONASE Place 2 sprays into both nostrils daily as needed for allergies or rhinitis.   formoterol 20 MCG/2ML nebulizer solution Commonly known as:  PERFOROMIST Take 2 mLs (20 mcg total) by nebulization 2 (two) times daily.   gabapentin 100 MG capsule Commonly known as:  NEURONTIN Take 1 capsule (100 mg total) 2 (two) times daily by mouth.   guaiFENesin 600 MG 12 hr tablet Commonly known as:  MUCINEX Take 1 tablet (600 mg total) by mouth 2 (two) times daily.   hydrocortisone 2.5 % rectal cream Commonly known as:  ANUSOL-HC Place rectally 2 (two) times daily.   ipratropium 0.02 % nebulizer solution Commonly known as:  ATROVENT Take 2.5 mLs (0.5 mg total) by nebulization 4 (four) times daily.   menthol-cetylpyridinium 3 MG lozenge Commonly known as:  CEPACOL Take 1 lozenge (3 mg total) as needed by mouth for sore throat.   montelukast 5 MG chewable tablet Commonly known as:  SINGULAIR CHEW 1 TABLET BY MOUTH AT BEDTIME   NASACORT ALLERGY 24HR 55 MCG/ACT Aero nasal inhaler Generic drug:  triamcinolone Place 2 sprays daily as needed into the nose (allergies).   nitroGLYCERIN 0.4 MG SL tablet Commonly known as:  NITROSTAT Place 0.4 mg under the tongue every 5 (five) minutes as needed for chest pain. x3 doses as needed for chest pain   pantoprazole 40 MG tablet Commonly known as:  PROTONIX Take 1 tablet (40 mg total) by mouth daily.   pramipexole 0.125 MG tablet Commonly known as:  MIRAPEX Take 1  tablet by mouth at bedtime.   predniSONE 10 MG tablet Commonly known as:  DELTASONE Take 60mg  daily for 3 days,Take 40mg  daily for 3days,Take 30mg  daily for 3days,Take 20mg  daily for 3days,Take 10mg  daily for 3days, then stop.   rosuvastatin 20 MG tablet Commonly known as:  CRESTOR Take 20 mg by mouth at bedtime.   saccharomyces boulardii 250 MG capsule Commonly known as:  FLORASTOR Take 1 capsule (250 mg total) by mouth 2 (two) times daily.   sodium chloride 0.65 % Soln nasal spray Commonly known as:  OCEAN Place 1 spray as needed into both nostrils for congestion (nose irritation).   tamsulosin 0.4 MG Caps capsule Commonly known as:  FLOMAX Take 0.4 mg by mouth.   vancomycin 50 mg/mL oral solution Commonly known as:  VANCOCIN Take 2.5 mLs (125 mg total) by mouth 4 (four) times daily.  Follow-up Information    [facility].   Specialty:  Home Health Services Why:  [facility] nursing Contact information: [address] [place] [address] [phone]        Rachael Fee, MD Follow up.   Specialty:  Gastroenterology Contact information: [address] [place] [address] [phone]        Leslye Peer, MD Follow up.   Specialty:  Pulmonary Disease Contact information: [address] [place] [address] [phone]        Ladene Artist, MD Follow up.   Specialty:  Oncology Contact information: [address] [place] [address] [phone]        Lyn Records, MD Follow up.   Specialty:  Cardiology Contact information: [address] [place] [address] [phone]          No Known Allergies  Consultations:  Cardiology, GI Dr. Christella Hartigan   Procedures/Studies: Dg Chest 2 View  Result Date: 07/14/2017 CLINICAL DATA:  Chest pain and shortness of breath tonight, history coronary artery disease post MI, COPD, CHF, hypertension, smoker EXAM: CHEST  2  VIEW COMPARISON:  05/06/2017 FINDINGS: Normal heart size, mediastinal contours, and pulmonary vascularity. Coronary arterial stent noted. Atherosclerotic calcification aorta. Emphysematous and minimal bronchitic changes consistent with COPD. RIGHT apex scarring. No acute infiltrate, pleural effusion or pneumothorax. Calcified granuloma LEFT upper lobe. Bones demineralized. IMPRESSION: COPD changes with RIGHT upper lobe scarring. No acute abnormalities. Electronically Signed   By: Ulyses Southward M.D.   On: 07/14/2017 19:12    (Echo, Carotid, EGD, Colonoscopy, ERCP)    Subjective:   Discharge Exam: Vitals:   07/19/17 1042 07/19/17 1324  BP:  (!) 124/56  Pulse:  77  Resp:  18  Temp:  97.8 F (36.6 C)  SpO2: 100% 100%   Vitals:   07/19/17 1002 07/19/17 1005 07/19/17 1042 07/19/17 1324  BP: (!) 137/59   (!) 124/56  Pulse: 73 72  77  Resp: 18 17  18   Temp:    97.8 F (36.6 C)  TempSrc:    Oral  SpO2: 100% 100% 100% 100%  Weight:      Height:        General: Pt is alert, awake, not in acute distress Cardiovascular: RRR, S1/S2 +, no rubs, no gallops Respiratory: CTA bilaterally, no wheezing, no rhonchi Abdominal: Soft, NT, ND, bowel sounds + Extremities: no edema, no cyanosis    The results of significant diagnostics from this hospitalization (including imaging, microbiology, ancillary and laboratory) are listed below for reference.     Microbiology: Recent Results (from the past 240 hour(s))  Respiratory Panel by PCR     Status: None   Collection Time: 07/16/17 12:29 PM  Result Value Ref Range Status   Adenovirus NOT DETECTED NOT DETECTED Final   Coronavirus 229E NOT DETECTED NOT DETECTED Final   Coronavirus HKU1 NOT DETECTED NOT DETECTED Final   Coronavirus NL63 NOT DETECTED NOT DETECTED Final   Coronavirus OC43 NOT DETECTED NOT DETECTED Final   Metapneumovirus NOT DETECTED NOT DETECTED Final   Rhinovirus / Enterovirus NOT DETECTED NOT DETECTED Final   Influenza A NOT  DETECTED NOT DETECTED Final   Influenza B NOT DETECTED NOT DETECTED Final   Parainfluenza Virus 1 NOT DETECTED NOT DETECTED Final   Parainfluenza Virus 2 NOT DETECTED NOT DETECTED Final   Parainfluenza Virus 3 NOT DETECTED NOT DETECTED Final   Parainfluenza Virus 4 NOT DETECTED  NOT DETECTED Final   Respiratory Syncytial Virus NOT DETECTED NOT DETECTED Final   Bordetella pertussis NOT DETECTED NOT DETECTED Final   Chlamydophila pneumoniae NOT DETECTED NOT DETECTED Final   Mycoplasma pneumoniae NOT DETECTED NOT DETECTED Final    Comment: Performed at [facility], [address], [place], [place] [address]  C difficile quick scan w PCR reflex     Status: Abnormal   Collection Time: 07/19/17  9:30 AM  Result Value Ref Range Status   C Diff antigen POSITIVE (A) NEGATIVE Final   C Diff toxin POSITIVE (A) NEGATIVE Corrected    Comment: CRITICAL RESULT CALLED TO, READ BACK BY AND VERIFIED WITH: DARK,T @ 1158 ON [id number] BY MCCOY,N CORRECTED RESULTS CALLED TO: CORRECTED ON 01/31 AT 1157: PREVIOUSLY REPORTED AS NEGATIVE    C Diff interpretation Toxin producing C. difficile detected.  Corrected    Comment: CORRECTED ON 01/31 AT 1157: PREVIOUSLY REPORTED AS Results are indeterminate. See PCR results.     Labs: BNP (last 3 results) Recent Labs    05/02/17 1850 07/16/17 0834  BNP 481.7* 239.6*   Basic Metabolic Panel: Recent Labs  Lab 07/14/17 1859 07/15/17 0505 07/17/17 0526 07/18/17 0538 07/19/17 0528  NA 141 140 139 139 139  K 3.7 3.8 3.3* 3.6 3.2*  CL 104 106 103 101 105  CO2 27 26 28 30 27   GLUCOSE 203* 243* 147* 153* 114*  BUN 18 20 27* 24* 24*  CREATININE 0.78 0.75 0.80 0.81 0.84  CALCIUM 8.8* 8.7* 8.7* 9.0 8.7*  MG  --   --  1.9  --   --    Liver Function Tests: Recent Labs  Lab 07/14/17 1859 07/15/17 0505  AST 20 19  ALT 15* 13*  ALKPHOS 61 53  BILITOT 0.2* <0.1*  PROT 6.9 6.1*  ALBUMIN 4.0 3.3*   Recent Labs  Lab 07/14/17 1859  LIPASE 41   No  results for input(s): AMMONIA in the last 168 hours. CBC: Recent Labs  Lab 07/14/17 1859 07/15/17 0505 07/16/17 0834 07/17/17 0526 07/18/17 0538 07/19/17 0528  WBC 9.4 7.8 15.2* 10.0 9.2 8.2  NEUTROABS 8.4*  --   --   --   --   --   HGB 8.6* 7.9* 8.0* 7.6* 8.3* 8.4*  HCT 29.0* 26.1* 26.3* 24.7* 27.5* 27.8*  MCV 75.3* 74.6* 74.3* 73.5* 73.5* 73.5*  PLT 265 250 288 242 245 276   Cardiac Enzymes: Recent Labs  Lab 07/14/17 2248 07/15/17 0505 07/15/17 1033  TROPONINI <0.03 <0.03 <0.03   BNP: Invalid input(s): POCBNP CBG: Recent Labs  Lab 07/18/17 1138 07/18/17 1757 07/18/17 2117 07/19/17 0728 07/19/17 1144  GLUCAP 243* 228* 95 117* 161*   D-Dimer No results for input(s): DDIMER in the last 72 hours. Hgb A1c No results for input(s): HGBA1C in the last 72 hours. Lipid Profile No results for input(s): CHOL, HDL, LDLCALC, TRIG, CHOLHDL, LDLDIRECT in the last 72 hours. Thyroid function studies No results for input(s): TSH, T4TOTAL, T3FREE, THYROIDAB in the last 72 hours.  Invalid input(s): FREET3 Anemia work up No results for input(s): VITAMINB12, FOLATE, FERRITIN, TIBC, IRON, RETICCTPCT in the last 72 hours. Urinalysis    Component Value Date/Time   COLORURINE YELLOW 05/02/2017 1427   APPEARANCEUR CLEAR 05/02/2017 1427   LABSPEC 1.010 05/02/2017 1427   PHURINE 6.5 05/02/2017 1427   GLUCOSEU 100 (A) 05/02/2017 1427   HGBUR MODERATE (A) 05/02/2017 1427   BILIRUBINUR NEGATIVE 05/02/2017 1427   KETONESUR NEGATIVE 05/02/2017 1427  PROTEINUR NEGATIVE 05/02/2017 1427   UROBILINOGEN 0.2 08/08/2012 1910   NITRITE NEGATIVE 05/02/2017 1427   LEUKOCYTESUR NEGATIVE 05/02/2017 1427   Sepsis Labs Invalid input(s): PROCALCITONIN,  WBC,  LACTICIDVEN Microbiology Recent Results (from the past 240 hour(s))  Respiratory Panel by PCR     Status: None   Collection Time: 07/16/17 12:29 PM  Result Value Ref Range Status   Adenovirus NOT DETECTED NOT DETECTED Final    Coronavirus 229E NOT DETECTED NOT DETECTED Final   Coronavirus HKU1 NOT DETECTED NOT DETECTED Final   Coronavirus NL63 NOT DETECTED NOT DETECTED Final   Coronavirus OC43 NOT DETECTED NOT DETECTED Final   Metapneumovirus NOT DETECTED NOT DETECTED Final   Rhinovirus / Enterovirus NOT DETECTED NOT DETECTED Final   Influenza A NOT DETECTED NOT DETECTED Final   Influenza B NOT DETECTED NOT DETECTED Final   Parainfluenza Virus 1 NOT DETECTED NOT DETECTED Final   Parainfluenza Virus 2 NOT DETECTED NOT DETECTED Final   Parainfluenza Virus 3 NOT DETECTED NOT DETECTED Final   Parainfluenza Virus 4 NOT DETECTED NOT DETECTED Final   Respiratory Syncytial Virus NOT DETECTED NOT DETECTED Final   Bordetella pertussis NOT DETECTED NOT DETECTED Final   Chlamydophila pneumoniae NOT DETECTED NOT DETECTED Final   Mycoplasma pneumoniae NOT DETECTED NOT DETECTED Final    Comment: Performed at [facility], [address], [place], [place] [address]  C difficile quick scan w PCR reflex     Status: Abnormal   Collection Time: 07/19/17  9:30 AM  Result Value Ref Range Status   C Diff antigen POSITIVE (A) NEGATIVE Final   C Diff toxin POSITIVE (A) NEGATIVE Corrected    Comment: CRITICAL RESULT CALLED TO, READ BACK BY AND VERIFIED WITH: DARK,T @ 1158 ON [id number] BY MCCOY,N CORRECTED RESULTS CALLED TO: CORRECTED ON 01/31 AT 1157: PREVIOUSLY REPORTED AS NEGATIVE    C Diff interpretation Toxin producing C. difficile detected.  Corrected    Comment: CORRECTED ON 01/31 AT 1157: PREVIOUSLY REPORTED AS Results are indeterminate. See PCR results.     Time coordinating discharge: Over 30 minutes  SIGNED:   Alwyn Ren, MD  [facility] 07/19/2017, 3:16 PM Pager   If 7PM-7AM, please contact night-coverage [email or web address] Password [email or web address]

## 2017-07-19 NOTE — Interval H&P Note (Signed)
History and Physical Interval Note:  07/19/2017 8:42 AM  John Marsh  has presented today for surgery, with the diagnosis of iron deficiency anemia  The various methods of treatment have been discussed with the patient and family. After consideration of risks, benefits and other options for treatment, the patient has consented to  Procedure(s): COLONOSCOPY WITH PROPOFOL (N/A) ESOPHAGOGASTRODUODENOSCOPY (EGD) WITH PROPOFOL (N/A) as a surgical intervention .  The patient's history has been reviewed, patient examined, no change in status, stable for surgery.  I have reviewed the patient's chart and labs.  Questions were answered to the patient's satisfaction.     Rachael Feeaniel P Jacobs

## 2017-07-19 NOTE — Care Management Note (Signed)
Case Management Note  Patient Details  Name: Harvie HeckHugh T Zollars MRN: [id number] Date of Birth: 1946-05-09  Subjective/Objective:  [facility] rep Clydie BraunKaren aware of d/c home w/HHC-HHRN. No further CM needs.                  Action/Plan:d/c home w/HHC.   Expected Discharge Date:  07/19/17               Expected Discharge Plan:  Home w Home Health Services  In-House Referral:     Discharge planning Services  CM Consult  Post Acute Care Choice:    Choice offered to:  Patient  DME Arranged:    DME Agency:     HH Arranged:  RN HH Agency:  [facility]  Status of Service:  Completed, signed off  If discussed at Long Length of Stay Meetings, dates discussed:    Additional Comments:  Lanier ClamMahabir, Obediah Welles, RN 07/19/2017, 3:48 PM

## 2017-07-19 NOTE — Progress Notes (Signed)
D/C instructions reviewed w/ pt and wife. Both verbalize understanding and all questions answered. Pt d/c in stable condition in w/c by NT to wife's car.  Pt in possession of d/c instructions, script, and all personal belongings.

## 2017-07-19 NOTE — Plan of Care (Signed)
  Completed/[facility]/Discharge Planning: Ability to manage health-related needs will improve 07/19/2017 1531 - Completed/Met by Kerrin Mo, RN Clinical Measurements: Ability to maintain clinical measurements within normal limits will improve 07/19/2017 1531 - Completed/Met by Kerrin Mo, RN Will remain free from infection 07/19/2017 1531 - Completed/Met by Kerrin Mo, RN Diagnostic test results will improve 07/19/2017 1531 - Completed/Met by Kerrin Mo, RN Respiratory complications will improve 07/19/2017 1531 - Completed/Met by Kerrin Mo, RN Cardiovascular complication will be avoided 07/19/2017 1531 - Completed/Met by Kerrin Mo, RN

## 2017-07-19 NOTE — Anesthesia Preprocedure Evaluation (Signed)
Anesthesia Evaluation  Patient identified by MRN, date of birth, ID band Patient awake    Reviewed: Allergy & Precautions, NPO status , Patient's Chart, lab work & pertinent test results  Airway Mallampati: II  TM Distance: >3 FB Neck ROM: Full    Dental no notable dental hx.    Pulmonary neg pulmonary ROS, COPD, Current Smoker,    Pulmonary exam normal breath sounds clear to auscultation       Cardiovascular hypertension, + CAD and + Past MI  negative cardio ROS Normal cardiovascular exam Rhythm:Regular Rate:Normal     Neuro/Psych negative neurological ROS  negative psych ROS   GI/Hepatic negative GI ROS, Neg liver ROS,   Endo/Other  negative endocrine ROSdiabetes  Renal/GU negative Renal ROS  negative genitourinary   Musculoskeletal negative musculoskeletal ROS (+)   Abdominal   Peds negative pediatric ROS (+)  Hematology negative hematology ROS (+) anemia ,   Anesthesia Other Findings   Reproductive/Obstetrics negative OB ROS                             Anesthesia Physical Anesthesia Plan  ASA: III  Anesthesia Plan: MAC   Post-op Pain Management:    Induction: Intravenous  PONV Risk Score and Plan: 0 and Treatment may vary due to age or medical condition  Airway Management Planned: Nasal Cannula  Additional Equipment:   Intra-op Plan:   Post-operative Plan:   Informed Consent: I have reviewed the patients History and Physical, chart, labs and discussed the procedure including the risks, benefits and alternatives for the proposed anesthesia with the patient or authorized representative who has indicated his/her understanding and acceptance.   Dental advisory given  Plan Discussed with: CRNA  Anesthesia Plan Comments:         Anesthesia Quick Evaluation

## 2017-07-19 NOTE — Transfer of Care (Signed)
Immediate Anesthesia Transfer of Care Note  Patient: ASHTIAN VILLACIS  Procedure(s) Performed: COLONOSCOPY WITH PROPOFOL (N/A ) ESOPHAGOGASTRODUODENOSCOPY (EGD) WITH PROPOFOL (N/A )  Patient Location: PACU  Anesthesia Type:MAC  Level of Consciousness: sedated and patient cooperative  Airway & Oxygen Therapy: Patient Spontanous Breathing and Patient connected to nasal cannula oxygen  Post-op Assessment: Report given to RN and Post -op Vital signs reviewed and stable  Post vital signs: stable  Last Vitals:  Vitals:   07/19/17 0516 07/19/17 0820  BP: 127/67 (!) 141/62  Pulse: 72 69  Resp: 20 17  Temp: 36.6 C 36.6 C  SpO2: 100% 100%    Last Pain:  Vitals:   07/19/17 0820  TempSrc: Oral  PainSc:       Patients Stated Pain Goal: 3 (64/15/83 0940)  Complications: No apparent anesthesia complications

## 2017-07-19 NOTE — Op Note (Signed)
[facility] Patient Name: John Marsh Procedure Date: 07/19/2017 MRN: [id number] Attending MD: Rachael Fee , MD Date of Birth: [birth year] CSN: [id number] Age: 72 Admit Type: Inpatient Procedure:                Upper GI endoscopy Indications:              Iron deficiency anemia Providers:                Rachael Fee, MD, Leandrew Koyanagi, RN, Madalyn Rob, Technician, Kandice Robinsons, Technician Referring MD:              Medicines:                Monitored Anesthesia Care Complications:            No immediate complications. Estimated blood loss:                            None. Estimated Blood Loss:     Estimated blood loss: none. Procedure:                Pre-Anesthesia Assessment:                           - Prior to the procedure, a History and Physical                            was performed, and patient medications and                            allergies were reviewed. The patient's tolerance of                            previous anesthesia was also reviewed. The risks                            and benefits of the procedure and the sedation                            options and risks were discussed with the patient.                            All questions were answered, and informed consent                            was obtained. Prior Anticoagulants: The patient has                            taken no previous anticoagulant or antiplatelet                            agents. ASA Grade Assessment: III - A patient with  severe systemic disease. After reviewing the risks                            and benefits, the patient was deemed in                            satisfactory condition to undergo the procedure.                           After obtaining informed consent, the endoscope was                            passed under direct vision. Throughout the                            procedure,  the patient's blood pressure, pulse, and                            oxygen saturations were monitored continuously. The                            Endoscope was introduced through the mouth, and                            advanced to the second part of duodenum. The upper                            GI endoscopy was accomplished without difficulty.                            The patient tolerated the procedure well. Scope In: Scope Out: Findings:      The UGI tract was normal.      Biopsies were taken from the normal appearing duodenum with a cold       forceps for evaluation of celiac disease. Impression:               - Normal examination.                           - Duodenum was biospied to check for Celiac Sprue                            given his IDA Moderate Sedation:      N/A- Per Anesthesia Care Recommendation:           - Return patient to hospital ward for ongoing care.                           - Advance diet as tolerated.                           - Continue present medications.                           - Await pathology results.                           -  Neither the colonoscopy nor this EGD explain his                            iron deficiency anemia (unless the duodenal                            biopsies prove he has Celiac Sprue which I doubt                            will be the case). I recommend further workup given                            the severity of his anemia. He had new lung nodules                            noted on CT 2-3 months ago, should consider repeat                            imaging now but I will leave that to primary                            service.                           - If the colon biopsies or stool testing today                            suggests C. diffcile infection, I will start him on                            the correct antibiotics. Procedure Code(s):        --- Professional ---                           [phone],  Esophagogastroduodenoscopy, flexible,                            transoral; with biopsy, single or multiple Diagnosis Code(s):        --- Professional ---                           D50.9, Iron deficiency anemia, unspecified CPT copyright 2016 [employer]. All rights reserved. The codes documented in this report are preliminary and upon coder review may  be revised to meet current compliance requirements. Rachael Fee, MD 07/19/2017 9:48:43 AM This report has been signed electronically. Number of Addenda: 0

## 2017-07-19 NOTE — Op Note (Signed)
[facility] Patient Name: John Marsh Procedure Date: 07/19/2017 MRN: [id number] Attending MD: Rachael Fee , MD Date of Birth: 04-12-1946 CSN: [id number] Age: 72 Admit Type: Inpatient Procedure:                Colonoscopy Indications:              Iron deficiency anemia Providers:                Rachael Fee, MD, Leandrew Koyanagi, RN, Madalyn Rob, Technician, Kandice Robinsons, Technician Referring MD:              Medicines:                Monitored Anesthesia Care Complications:            No immediate complications. Estimated blood loss:                            None. Estimated Blood Loss:     Estimated blood loss: none. Procedure:                Pre-Anesthesia Assessment:                           - Prior to the procedure, a History and Physical                            was performed, and patient medications and                            allergies were reviewed. The patient's tolerance of                            previous anesthesia was also reviewed. The risks                            and benefits of the procedure and the sedation                            options and risks were discussed with the patient.                            All questions were answered, and informed consent                            was obtained. Prior Anticoagulants: The patient has                            taken no previous anticoagulant or antiplatelet                            agents. ASA Grade Assessment: III - A patient with  severe systemic disease. After reviewing the risks                            and benefits, the patient was deemed in                            satisfactory condition to undergo the procedure.                           After obtaining informed consent, the colonoscope                            was passed under direct vision. Throughout the                            procedure, the  patient's blood pressure, pulse, and                            oxygen saturations were monitored continuously. The                            EC-[id number] ([id number] scope was introduced through                            the anus and advanced to the the terminal ileum.                            The colonoscopy was performed without difficulty.                            The patient tolerated the procedure well. The                            quality of the bowel preparation was good. The                            terminal ileum, ileocecal valve, appendiceal                            orifice, and rectum were photographed. Scope In: 9:05:52 AM Scope Out: 9:21:48 AM Scope Withdrawal Time: 0 hours 8 minutes 8 seconds  Total Procedure Duration: 0 hours 15 minutes 56 seconds  Findings:      The terminal ileum appeared normal.      There were scattered whitish, yellow exudates throughout the colon. Most       signficant was in the cecum, proximal ascending segments where the       exudate was confluent. Multiple biopsies taken and sent to pathology.       Given high suspicion for C. diff, will also sent stool effluent for       micro testing.      The exam was otherwise without abnormality on direct and retroflexion       views. Impression:               - The examined portion of the ileum was  normal.                           - Whitish, yellow exudates throughout the colon;                            most significant in right colon. Biopsies taken and                            stool sent for micro testing (C. diff and GI                            pathogen panel)                           - The examination was otherwise normal on direct                            and retroflexion views.                           - No polyps or cancers. Moderate Sedation:      N/A- Per Anesthesia Care Recommendation:           - EGD now Procedure Code(s):        --- Professional ---                            [phone], Colonoscopy, flexible; with biopsy, single                            or multiple Diagnosis Code(s):        --- Professional ---                           A04.7, Enterocolitis due to Clostridium difficile                           D50.9, Iron deficiency anemia, unspecified CPT copyright 2016 American Medical Association. All rights reserved. The codes documented in this report are preliminary and upon coder review may  be revised to meet current compliance requirements. Rachael Fee, MD 07/19/2017 9:41:36 AM This report has been signed electronically. Number of Addenda: 0

## 2017-07-19 NOTE — Anesthesia Postprocedure Evaluation (Signed)
Anesthesia Post Note  Patient: John Marsh  Procedure(s) Performed: COLONOSCOPY WITH PROPOFOL (N/A ) ESOPHAGOGASTRODUODENOSCOPY (EGD) WITH PROPOFOL (N/A )     Patient location during evaluation: PACU Anesthesia Type: MAC Level of consciousness: awake and alert Pain management: pain level controlled Vital Signs Assessment: post-procedure vital signs reviewed and stable Respiratory status: spontaneous breathing, nonlabored ventilation and respiratory function stable Cardiovascular status: stable and blood pressure returned to baseline Postop Assessment: no apparent nausea or vomiting Anesthetic complications: no    Last Vitals:  Vitals:   07/19/17 1002 07/19/17 1005  BP: (!) 137/59   Pulse: 73 72  Resp: 18 17  Temp:    SpO2: 100% 100%    Last Pain:  Vitals:   07/19/17 0936  TempSrc: Oral  PainSc:                  Lynda Rainwater

## 2017-07-24 ENCOUNTER — Ambulatory Visit (HOSPITAL_COMMUNITY)
Admission: RE | Admit: 2017-07-24 | Discharge: 2017-07-24 | Disposition: A | Discharge status: Home / Self Care | Payer: Medicare Other | Source: Ambulatory Visit | Attending: Acute Care | Admitting: Acute Care

## 2017-07-24 DIAGNOSIS — I700 Atherosclerosis of aorta: Secondary | ICD-10-CM | POA: Diagnosis not present

## 2017-07-24 DIAGNOSIS — J432 Centrilobular emphysema: Secondary | ICD-10-CM | POA: Insufficient documentation

## 2017-07-25 ENCOUNTER — Ambulatory Visit (INDEPENDENT_AMBULATORY_CARE_PROVIDER_SITE_OTHER): Payer: Medicare Other | Admitting: Acute Care

## 2017-07-25 ENCOUNTER — Encounter: Payer: Self-pay | Admitting: Acute Care

## 2017-07-25 VITALS — BP 130/64 | HR 82 | Ht 72.0 in | Wt 159.8 lb

## 2017-07-25 DIAGNOSIS — R911 Solitary pulmonary nodule: Secondary | ICD-10-CM | POA: Diagnosis not present

## 2017-07-25 DIAGNOSIS — J309 Allergic rhinitis, unspecified: Secondary | ICD-10-CM

## 2017-07-25 DIAGNOSIS — R918 Other nonspecific abnormal finding of lung field: Secondary | ICD-10-CM | POA: Diagnosis not present

## 2017-07-25 DIAGNOSIS — F17200 Nicotine dependence, unspecified, uncomplicated: Secondary | ICD-10-CM | POA: Diagnosis not present

## 2017-07-25 DIAGNOSIS — J438 Other emphysema: Secondary | ICD-10-CM | POA: Diagnosis not present

## 2017-07-25 DIAGNOSIS — J9621 Acute and chronic respiratory failure with hypoxia: Secondary | ICD-10-CM

## 2017-07-25 NOTE — Patient Instructions (Addendum)
It is nice to meet you today. Ambulatory referral to Lung Cancer Screening program, starting 07/2018. Continuing patient on Pulmicort and Perforomist twice a day.  Atrovent nebulized 4 times a day. Pulmonary Rehab as scheduled.  For Chronic seasonal allergic rhinitis: Patient continuing on Nasacort, saline nasal spray, and Singulair Chronic hypoxic respiratory failure: Continuing on oxygen as previously prescribed with portable concentrator. Remember oxygen saturations goal is 88-92% You do not ned the prednisone taper. Note your daily symptoms > remember "red flags" for COPD:  Increase in cough, increase in sputum production, increase in shortness of breath or activity tolerance. If you notice these symptoms, please call to be seen.   Follow up with Dr. Kendrick FriesMcQuaid in 3 months to establish care, transition form Dr. Jamison NeighborNestor. Please contact office for sooner follow up if symptoms do not improve or worsen or seek emergency care

## 2017-07-25 NOTE — Assessment & Plan Note (Signed)
For Chronic seasonal allergic rhinitis: Patient continuing on Nasacort, saline nasal spray, and Singulair

## 2017-07-25 NOTE — Assessment & Plan Note (Signed)
Recent flare while in the hospital for iron deficiency anemia  Resolved Completed doxycycline Did not complete prednisone due to [facility] being unable to fill it. Plan Ambulatory referral to Lung Cancer Screening program, starting 07/2018. Continuing patient on Pulmicort and Perforomist twice a day.  Atrovent nebulized 4 times a day. Pulmonary Rehab as scheduled.  For Chronic seasonal allergic rhinitis: Patient continuing on Nasacort, saline nasal spray, and Singulair Chronic hypoxic respiratory failure: Continuing on oxygen as previously prescribed with portable concentrator. Remember oxygen saturations goal is 88-92% You do not ned the prednisone taper. Note your daily symptoms > remember "red flags" for COPD:  Increase in cough, increase in sputum production, increase in shortness of breath or activity tolerance. If you notice these symptoms, please call to be seen.   Follow up with Dr. Kendrick FriesMcQuaid in 3 months to establish care, transition form Dr. Jamison NeighborNestor. Please contact office for sooner follow up if symptoms do not improve or worsen or seek emergency care

## 2017-07-25 NOTE — Assessment & Plan Note (Signed)
You must quit smoking I have spent 3 minutes counseling patient on smoking cessation this visit.

## 2017-07-25 NOTE — Assessment & Plan Note (Signed)
Chronic hypoxic respiratory failure: Continuing on oxygen as previously prescribed with portable concentrator. Remember oxygen saturations goal is 88-92%

## 2017-07-25 NOTE — Assessment & Plan Note (Signed)
2019 CT scan indicates stable nodules Plan Refer to lung cancer screening program starting February 2020 You must quit smoking

## 2017-07-25 NOTE — Progress Notes (Signed)
History of Present Illness John Marsh is a 72 y.o. male current  smoker  with Very Severe COPD w/ Emphysema, Multiple Lung Nodules, Chronic Hypoxic Respiratory Failure, Chronic Seasonal Allergic Rhinitis, & Tobacco Use Disorder.He was previously followed by John Marsh.   07/25/2017 3 month follow up after CT Chest: Pt. Presents for follow up. He was followed by John Marsh for pulmonary modules.CT today is stable. Pt. Was recently in the hospital for a Iron deficiency anemia. He was treated with doxycycline and was supposed to start a prednisone taper, but he has not started the pred taper, this is due to John Marsh in [place] being unable to dispense the drug as they had a question about the prescription.Marland KitchenHe states he did finish the doxycycline.He states he is compliant with his oxygen. 2L at rest and 3L with exertion.Pt. Is recovering from COPD exacerbation. He currently has no secretions,He denies fever, chest pain, orthopnea or hemoptysis. He states he has no wheezing .  Patient states his breathing is better now than it has been in a very long period time.  This patient qualifies for lung cancer screening and therefore will be referred to the program for continued follow-up  of his pulmonary nodules.  Additionally will reassign him to John Marsh, the patient's request,  as John Marsh is no longer with the practice.    Test Results:   CT chest without contrast 07/24/2017 IMPRESSION: No suspicious pulmonary nodules. Stable linear nodular scarring at the RIGHT lung apex. Resolution of noncalcified pulmonary nodule RIGHT lower lobe. Benign calcified granulomas.  Aortic Atherosclerosis (ICD10-I70.0) and Emphysema (ICD10-J43.9).  CBC Latest Ref Rng & Units 07/19/2017 07/18/2017 07/17/2017  WBC 4.0 - 10.5 K/uL 8.2 9.2 10.0  Hemoglobin 13.0 - 17.0 g/dL 6.0(A) 8.3(L) 7.6(L)  Hematocrit 39.0 - 52.0 % 27.8(L) 27.5(L) 24.7(L)  Platelets 150 - 400 K/uL 276 245 242    BMP Latest Ref Rng & Units  07/19/2017 07/18/2017 07/17/2017  Glucose 65 - 99 mg/dL 540(J) 811(B) 147(W)  BUN 6 - 20 mg/dL 29(F) 62(Z) 30(Q)  Creatinine 0.61 - 1.24 mg/dL 6.57 8.46 9.62  Sodium 135 - 145 mmol/L 139 139 139  Potassium 3.5 - 5.1 mmol/L 3.2(L) 3.6 3.3(L)  Chloride 101 - 111 mmol/L 105 101 103  CO2 22 - 32 mmol/L 27 30 28   Calcium 8.9 - 10.3 mg/dL 9.5(M) 9.0 8.4(X)    BNP    Component Value Date/Time   BNP 239.6 (H) 07/16/2017 0834    ProBNP No results found for: PROBNP  PFT    Component Value Date/Time   FEV1PRE 1.06 05/30/2016 1052   FEV1POST 1.13 05/30/2016 1052   FVCPRE 2.46 05/30/2016 1052   FVCPOST 3.01 05/30/2016 1052   TLC 9.10 09/14/2014 1049   DLCOUNC 13.07 09/14/2014 1049   PREFEV1FVCRT 43 05/30/2016 1052   PSTFEV1FVCRT 37 05/30/2016 1052    Dg Chest 2 View  Result Date: 07/14/2017 CLINICAL DATA:  Chest pain and shortness of breath tonight, history coronary artery disease post MI, COPD, CHF, hypertension, smoker EXAM: CHEST  2 VIEW COMPARISON:  05/06/2017 FINDINGS: Normal heart size, mediastinal contours, and pulmonary vascularity. Coronary arterial stent noted. Atherosclerotic calcification aorta. Emphysematous and minimal bronchitic changes consistent with COPD. RIGHT apex scarring. No acute infiltrate, pleural effusion or pneumothorax. Calcified granuloma LEFT upper lobe. Bones demineralized. IMPRESSION: COPD changes with RIGHT upper lobe scarring. No acute abnormalities. Electronically Signed   By: Ulyses Southward M.D.   On: 07/14/2017 19:12   Ct Chest Wo Contrast  Result Date: 07/24/2017 CLINICAL DATA:  Severe COPD w/ Emphysema, Bilateral Lung Nodules, Nocturnal Hypoxia; smoker EXAM: CT CHEST WITHOUT CONTRAST TECHNIQUE: Multidetector CT imaging of the chest was performed following the standard protocol without IV contrast. COMPARISON:  Chest CT  05/01/2017 FINDINGS: Cardiovascular: Coronary artery calcification and aortic atherosclerotic calcification. Mediastinum/Nodes: No axillary  or supraclavicular adenopathy. No mediastinal or hilar adenopathy. No pericardial fluid. Lungs/Pleura: Severe centrilobular emphysema. Linear scarring and nodularity at the RIGHT lung apex unchanged. Several calcified granuloma is within the RIGHT lung and LEFT lung. No suspicious pulmonary nodules. Airways are normal. Upper Abdomen: Limited view of the liver, kidneys, pancreas are unremarkable. Normal adrenal glands. Musculoskeletal: No aggressive osseous lesion. IMPRESSION: 1. No suspicious pulmonary nodules. Stable linear nodular scarring at the RIGHT lung apex. 2. Resolution of noncalcified pulmonary nodule RIGHT lower lobe. 3. Benign calcified granulomas. Aortic Atherosclerosis (ICD10-I70.0) and Emphysema (ICD10-J43.9). Electronically Signed   By: Genevive Bi M.D.   On: 07/24/2017 16:19     Past medical hx Past Medical History:  Diagnosis Date  . Anemia   . Atrial fibrillation (HCC)    Chadsvasc2=3  . COPD (chronic obstructive pulmonary disease) (HCC)   . Coronary heart disease   . Emphysema of lung (HCC)   . Hyperlipidemia   . Hypertension   . Iron deficiency anemia   . MI (myocardial infarction) (HCC)   . Multiple lung nodules on CT   . Nocturnal hypoxia   . Peripheral arterial disease (HCC)   . Shortness of breath dyspnea      Social History   Tobacco Use  . Smoking status: Current Some Day Smoker    Packs/day: 0.25    Years: 55.00    Pack years: 13.75    Types: Cigarettes    Start date: 08/01/1959  . Smokeless tobacco: Never Used  . Tobacco comment: smokes 4-5 cigerettes/day  Substance Use Topics  . Alcohol use: No    Alcohol/week: 0.0 oz  . Drug use: No    JohnMarsh reports that he has been smoking cigarettes.  He started smoking about 58 years ago. He has a 13.75 pack-year smoking history. he has never used smokeless tobacco. He reports that he does not drink alcohol or use drugs.  Tobacco Cessation: Current every day smoker Past surgical hx, Family hx,  Social hx all reviewed.  Current Outpatient Medications on File Prior to Visit  Medication Sig  . acetaminophen (TYLENOL) 325 MG tablet Take 650 mg by mouth every 6 (six) hours as needed for mild pain.  Marland Kitchen albuterol (PROVENTIL HFA;VENTOLIN HFA) 108 (90 Base) MCG/ACT inhaler Inhale 2 puffs into the lungs every 6 (six) hours as needed for wheezing or shortness of breath.  Marland Kitchen apixaban (ELIQUIS) 5 MG TABS tablet Take 1 tablet (5 mg total) 2 (two) times daily by mouth.  . ATROVENT HFA 17 MCG/ACT inhaler Take 2 puffs by mouth 4 (four) times daily.  . benzonatate (TESSALON) 100 MG capsule Take 1 capsule (100 mg total) 3 (three) times daily by mouth.  . budesonide (PULMICORT) 0.5 MG/2ML nebulizer solution Take 2 mLs (0.5 mg total) by nebulization 2 (two) times daily.  Marland Kitchen diltiazem (CARDIZEM CD) 360 MG 24 hr capsule Take 1 capsule (360 mg total) daily by mouth.  . feeding supplement, ENSURE ENLIVE, (ENSURE ENLIVE) LIQD Take 237 mLs by mouth 3 (three) times daily between meals.  . fluticasone (FLONASE) 50 MCG/ACT nasal spray Place 2 sprays into both nostrils daily as needed for allergies or rhinitis.  . formoterol (  PERFOROMIST) 20 MCG/2ML nebulizer solution Take 2 mLs (20 mcg total) by nebulization 2 (two) times daily.  Marland Kitchen. gabapentin (NEURONTIN) 100 MG capsule Take 1 capsule (100 mg total) 2 (two) times daily by mouth.  Marland Kitchen. guaiFENesin (MUCINEX) 600 MG 12 hr tablet Take 1 tablet (600 mg total) by mouth 2 (two) times daily.  Marland Kitchen. menthol-cetylpyridinium (CEPACOL) 3 MG lozenge Take 1 lozenge (3 mg total) as needed by mouth for sore throat.  . montelukast (SINGULAIR) 5 MG chewable tablet CHEW 1 TABLET BY MOUTH AT BEDTIME  . nitroGLYCERIN (NITROSTAT) 0.4 MG SL tablet Place 0.4 mg under the tongue every 5 (five) minutes as needed for chest pain. x3 doses as needed for chest pain  . pantoprazole (PROTONIX) 40 MG tablet Take 1 tablet (40 mg total) by mouth daily.  . pramipexole (MIRAPEX) 0.125 MG tablet Take 1 tablet by  mouth at bedtime.  . rosuvastatin (CRESTOR) 20 MG tablet Take 20 mg by mouth at bedtime.  . sodium chloride (OCEAN) 0.65 % SOLN nasal spray Place 1 spray as needed into both nostrils for congestion (nose irritation).  . Spacer/Aero-Holding Chambers (AEROCHAMBER Z-STAT PLUS) inhaler Use as instructed  . tamsulosin (FLOMAX) 0.4 MG CAPS capsule Take 0.4 mg by mouth.  . triamcinolone (NASACORT ALLERGY 24HR) 55 MCG/ACT AERO nasal inhaler Place 2 sprays daily as needed into the nose (allergies).   . predniSONE (DELTASONE) 10 MG tablet Take 60mg  daily for 3 days,Take 40mg  daily for 3days,Take 30mg  daily for 3days,Take 20mg  daily for 3days,Take 10mg  daily for 3days, then stop. (Patient not taking: Reported on 07/25/2017)   No current facility-administered medications on file prior to visit.      No Known Allergies  Review Of Systems:  Constitutional:   No  weight loss, night sweats,  Fevers, chills, fatigue, or  lassitude.  HEENT:   No headaches,  Difficulty swallowing,  Tooth/dental problems, or  Sore throat,                No sneezing, itching, ear ache, nasal congestion, post nasal drip,   CV:  No chest pain,  Orthopnea, PND, swelling in lower extremities, anasarca, dizziness, palpitations, syncope.   GI  No heartburn, indigestion, abdominal pain, nausea, vomiting, diarrhea, change in bowel habits, loss of appetite, bloody stools.   Resp: + Baseline shortness of breath with exertion or at rest.  No excess mucus, no productive cough,  No non-productive cough,  No coughing up of blood.  No change in color of mucus.  No wheezing.  No chest wall deformity  Skin: no rash or lesions.  GU: no dysuria, change in color of urine, no urgency or frequency.  No flank pain, no hematuria   MS:  No joint pain or swelling.  No decreased range of motion.  No back pain.  Psych:  No change in mood or affect. No depression or anxiety.  No memory loss.   Vital Signs BP 130/64 (BP Location: Left Arm, Cuff  Size: Normal)   Pulse 82   Ht 6' (1.829 m)   Wt 159 lb 12.8 oz (72.5 kg)   SpO2 98%   BMI 21.67 kg/m    Physical Exam:  General- No distress,  A&Ox3, pleasant ENT: No sinus tenderness, TM clear, pale nasal mucosa, no oral exudate,no post nasal drip, no LAN Cardiac: S1, S2, regular rate and rhythm, no murmur Chest: No wheeze/ rales/ dullness; no accessory muscle use, no nasal flaring, no sternal retractions, prolonged expiratory phase, diminished in the bases  Abd.: Soft Non-tender, nondistended, bowel sounds positive Ext: No clubbing cyanosis, trace bilateral lower extremity edema Neuro:  normal strength Skin: No rashes, warm and dry Psych: normal mood and behavior   Assessment/Plan  COPD (chronic obstructive pulmonary disease) with emphysema (HCC) Recent flare while in the hospital for iron deficiency anemia  Resolved Completed doxycycline Did not complete prednisone due to [facility] being unable to fill it. Plan Ambulatory referral to Lung Cancer Screening program, starting 07/2018. Continuing patient on Pulmicort and Perforomist twice a day.  Atrovent nebulized 4 times a day. Pulmonary Rehab as scheduled.  For Chronic seasonal allergic rhinitis: Patient continuing on Nasacort, saline nasal spray, and Singulair Chronic hypoxic respiratory failure: Continuing on oxygen as previously prescribed with portable concentrator. Remember oxygen saturations goal is 88-92% You do not ned the prednisone taper. Note your daily symptoms > remember "red flags" for COPD:  Increase in cough, increase in sputum production, increase in shortness of breath or activity tolerance. If you notice these symptoms, please call to be seen.   Follow up with John Marsh in 3 months to establish care, transition form John Marsh. Please contact office for sooner follow up if symptoms do not improve or worsen or seek emergency care     Acute on chronic respiratory failure with hypoxia (HCC) Chronic  hypoxic respiratory failure: Continuing on oxygen as previously prescribed with portable concentrator. Remember oxygen saturations goal is 88-92%   Allergic rhinitis For Chronic seasonal allergic rhinitis: Patient continuing on Nasacort, saline nasal spray, and Singulair   Pulmonary nodules 2019 CT scan indicates stable nodules Plan Refer to lung cancer screening program starting February 2020 You must quit smoking  TOBACCO DEPENDENCE You must quit smoking I have spent 3 minutes counseling patient on smoking cessation this visit.    Bevelyn Ngo, NP 07/25/2017  6:17 PM

## 2017-07-31 ENCOUNTER — Ambulatory Visit: Payer: Medicare Other | Admitting: Nurse Practitioner

## 2017-08-15 ENCOUNTER — Ambulatory Visit (INDEPENDENT_AMBULATORY_CARE_PROVIDER_SITE_OTHER): Payer: Medicare Other | Admitting: Nurse Practitioner

## 2017-08-15 ENCOUNTER — Encounter: Payer: Self-pay | Admitting: Nurse Practitioner

## 2017-08-15 VITALS — BP 160/60 | HR 80 | Ht 72.0 in | Wt 168.8 lb

## 2017-08-15 DIAGNOSIS — I259 Chronic ischemic heart disease, unspecified: Secondary | ICD-10-CM | POA: Diagnosis not present

## 2017-08-15 DIAGNOSIS — Z79899 Other long term (current) drug therapy: Secondary | ICD-10-CM | POA: Diagnosis not present

## 2017-08-15 DIAGNOSIS — R0602 Shortness of breath: Secondary | ICD-10-CM | POA: Diagnosis not present

## 2017-08-15 DIAGNOSIS — I480 Paroxysmal atrial fibrillation: Secondary | ICD-10-CM | POA: Diagnosis not present

## 2017-08-15 DIAGNOSIS — Z7901 Long term (current) use of anticoagulants: Secondary | ICD-10-CM | POA: Diagnosis not present

## 2017-08-15 DIAGNOSIS — I350 Nonrheumatic aortic (valve) stenosis: Secondary | ICD-10-CM | POA: Diagnosis not present

## 2017-08-15 DIAGNOSIS — D509 Iron deficiency anemia, unspecified: Secondary | ICD-10-CM | POA: Diagnosis not present

## 2017-08-15 DIAGNOSIS — I739 Peripheral vascular disease, unspecified: Secondary | ICD-10-CM | POA: Diagnosis not present

## 2017-08-15 DIAGNOSIS — R911 Solitary pulmonary nodule: Secondary | ICD-10-CM

## 2017-08-15 NOTE — Progress Notes (Signed)
CARDIOLOGY OFFICE NOTE  Date:  08/15/2017    John Marsh Date of Birth: [birth year] Medical Record #[id number]  PCP:  Eartha InchBadger, Michael C, MD  Cardiologist:  Katrinka BlazingSmith    Chief Complaint  Patient presents with  . Atrial Fibrillation  . Coronary Artery Disease  . Aortic Stenosis    Post hospital visit - seen for Dr. Katrinka BlazingSmith    History of Present Illness: John HeckHugh T Naval is a 72 y.o. male who presents today for a post hospital visit. Seen for Dr. Katrinka BlazingSmith.   He has a history of COPD on oxygen (2 L rest, 3 w/exertion), AF previously on xarelto, CAD (remote stenting 2007 - further details unknown), tobaco abuse, pulmonary nodules by CT, DM, GERD, PAD (details unknown), & HTN.   He was in the hospital11/2018with increased SOB/CP in setting of acute PNA, COPD exacerbation and sepsis.During that admission he developedatrial fibrillation with RVR(?later notes suggest atrial flutter only). He was also noted to be anemic with Hgb in 8 range (down from 13 in 09/2016); FOBT negative, studies suggestive of iron deficiency. GI Dr. Georgiann CockerBrahmbatt followed - [facility]; outpt GI eval recommended.   Then readmitted with continued/worsening SOB (x 1 year) as well as chest tightness (NSR on adm) last month. Seen by cardiology who felt that the anemia was the driving factor and recommended in patient evaluation. Anticoagulation was held. Upper GI was normal.  Biopsy from the duodenum was taken to rule out celiac disease.  Otherwise there was no evidence of bleeding.  Colonoscopy also did not reveal any evidence of bleeding.  But it did show several areas of white patchy areas from which a biopsy was sent C. difficile was sent and C. difficile came back positive immediately.  He was started on vancomycin prior to discharge from the hospital.  He was to continue on vancomycin and then follow-up with Dr. Christella HartiganJacobs for the biopsy results.  Was also to be referred to hematology for idiopathic iron deficiency  anemia. Eliquis was restarted at discharge. He was to be seen in the pulmonary office for his lung nodule (prior patient of Dr. Crista CurbNestor's).   Comes in today. Here with his wife. Says he is ok "because at least I'm alive". Has his oxygen in place. He continues to smoke - a few packs per week despite being on oxgyen. Chest pain has improved considerably - says nothing like what it was. Has seen PCP since discharge - sounds like it was more for leg pain - has PAD - last seen by Dr. Arbie CookeyEarly over a year ago. Has not gotten referral for hematology. Has not gotten appointment for pulmonary. No labs since discharge. Really has no real concern today. Rhythm has been ok. He is back on Eliquis. Reports better BP control at home.   Past Medical History:  Diagnosis Date  . Anemia   . Atrial fibrillation (HCC)    Chadsvasc2=3  . COPD (chronic obstructive pulmonary disease) (HCC)   . Coronary heart disease   . Emphysema of lung (HCC)   . Hyperlipidemia   . Hypertension   . Iron deficiency anemia   . MI (myocardial infarction) (HCC)   . Multiple lung nodules on CT   . Nocturnal hypoxia   . Peripheral arterial disease (HCC)   . Shortness of breath dyspnea     Past Surgical History:  Procedure Laterality Date  . CHOLECYSTECTOMY    . COLONOSCOPY WITH PROPOFOL N/A 07/19/2017   Procedure: COLONOSCOPY WITH  PROPOFOL;  Surgeon: Rachael Fee, MD;  Location: [facility];  Service: Endoscopy;  Laterality: N/A;  . ESOPHAGOGASTRODUODENOSCOPY (EGD) WITH PROPOFOL N/A 07/19/2017   Procedure: ESOPHAGOGASTRODUODENOSCOPY (EGD) WITH PROPOFOL;  Surgeon: Rachael Fee, MD;  Location: [facility];  Service: Endoscopy;  Laterality: N/A;  . NASAL SEPTUM SURGERY    . PERIPHERAL VASCULAR CATHETERIZATION N/A 07/27/2015   Procedure: Abdominal Aortogram;  Surgeon: Chuck Hint, MD;  Location: [facility] INVASIVE CV LAB;  Service: Cardiovascular;  Laterality: N/A;  . PERIPHERAL VASCULAR CATHETERIZATION Bilateral 07/27/2015    Procedure: Lower Extremity Angiography;  Surgeon: Chuck Hint, MD;  Location: [facility] INVASIVE CV LAB;  Service: Cardiovascular;  Laterality: Bilateral;  . stent placed       Medications: Current Meds  Medication Sig  . acetaminophen (TYLENOL) 325 MG tablet Take 650 mg by mouth every 6 (six) hours as needed for mild pain.  Marland Kitchen albuterol (PROVENTIL HFA;VENTOLIN HFA) 108 (90 Base) MCG/ACT inhaler Inhale 2 puffs into the lungs every 6 (six) hours as needed for wheezing or shortness of breath.  Marland Kitchen apixaban (ELIQUIS) 5 MG TABS tablet Take 1 tablet (5 mg total) 2 (two) times daily by mouth.  . ATROVENT HFA 17 MCG/ACT inhaler Take 2 puffs by mouth 4 (four) times daily.  . benzonatate (TESSALON) 100 MG capsule Take 1 capsule (100 mg total) 3 (three) times daily by mouth.  . budesonide (PULMICORT) 0.5 MG/2ML nebulizer solution Take 2 mLs (0.5 mg total) by nebulization 2 (two) times daily.  Marland Kitchen diltiazem (CARDIZEM CD) 360 MG 24 hr capsule Take 1 capsule (360 mg total) daily by mouth.  . feeding supplement, ENSURE ENLIVE, (ENSURE ENLIVE) LIQD Take 237 mLs by mouth 3 (three) times daily between meals.  . fluticasone (FLONASE) 50 MCG/ACT nasal spray Place 2 sprays into both nostrils daily as needed for allergies or rhinitis.  . formoterol (PERFOROMIST) 20 MCG/2ML nebulizer solution Take 2 mLs (20 mcg total) by nebulization 2 (two) times daily.  Marland Kitchen gabapentin (NEURONTIN) 100 MG capsule Take 1 capsule (100 mg total) 2 (two) times daily by mouth.  Marland Kitchen guaiFENesin (MUCINEX) 600 MG 12 hr tablet Take 1 tablet (600 mg total) by mouth 2 (two) times daily.  Marland Kitchen HYDROcodone-acetaminophen (NORCO/VICODIN) 5-325 MG tablet Take 1 tablet by mouth every 4 (four) hours as needed for severe pain.   Marland Kitchen menthol-cetylpyridinium (CEPACOL) 3 MG lozenge Take 1 lozenge (3 mg total) as needed by mouth for sore throat.  . montelukast (SINGULAIR) 5 MG chewable tablet CHEW 1 TABLET BY MOUTH AT BEDTIME  . nitroGLYCERIN (NITROSTAT) 0.4 MG SL  tablet Place 0.4 mg under the tongue every 5 (five) minutes as needed for chest pain. x3 doses as needed for chest pain  . pantoprazole (PROTONIX) 40 MG tablet Take 1 tablet (40 mg total) by mouth daily.  . pramipexole (MIRAPEX) 0.125 MG tablet Take 1 tablet by mouth at bedtime.  . predniSONE (DELTASONE) 10 MG tablet Take 60mg  daily for 3 days,Take 40mg  daily for 3days,Take 30mg  daily for 3days,Take 20mg  daily for 3days,Take 10mg  daily for 3days, then stop.  . rosuvastatin (CRESTOR) 20 MG tablet Take 20 mg by mouth at bedtime.  . sodium chloride (OCEAN) 0.65 % SOLN nasal spray Place 1 spray as needed into both nostrils for congestion (nose irritation).  . Spacer/Aero-Holding Chambers (AEROCHAMBER Z-STAT PLUS) inhaler Use as instructed  . tamsulosin (FLOMAX) 0.4 MG CAPS capsule Take 0.4 mg by mouth.  . triamcinolone (NASACORT ALLERGY 24HR) 55 MCG/ACT AERO nasal inhaler Place 2  sprays daily as needed into the nose (allergies).      Allergies: No Known Allergies  Social History: The patient  reports that he has been smoking cigarettes.  He started smoking about 58 years ago. He has a 13.75 pack-year smoking history. he has never used smokeless tobacco. He reports that he does not drink alcohol or use drugs.   Family History: The patient's family history includes Heart disease in his brother, father, mother, and sister.   Review of Systems: Please see the history of present illness.   Otherwise, the review of systems is positive for none.   All other systems are reviewed and negative.   Physical Exam: VS:  BP (!) 160/60 (BP Location: Right Arm, Patient Position: Sitting, Cuff Size: Normal)   Pulse 80   Ht 6' (1.829 m)   Wt 168 lb 12.8 oz (76.6 kg)   SpO2 99% Comment: with 2 L of 02  BMI 22.89 kg/m  .  BMI Body mass index is 22.89 kg/m.  Wt Readings from Last 3 Encounters:  08/15/17 168 lb 12.8 oz (76.6 kg)  07/25/17 159 lb 12.8 oz (72.5 kg)  07/18/17 157 lb 10.1 oz (71.5 kg)     General: Older male. Smells of tobacco. Has oxygen in place. Alert and in no acute distress.   HEENT: Normal.  Neck: Supple, no JVD, carotid bruits, or masses noted.  Cardiac: Heart tones distant. No edema. Decreased pulses.  Respiratory:  Decreased breath sounds. He has oxygen in place. Short of breath with ambulation.   GI: Soft and nontender.  MS: No deformity or atrophy. Gait and ROM intact.  Skin: Warm and dry. Color is normal.  Neuro:  Strength and sensation are intact and no gross focal deficits noted.  Psych: Alert, appropriate and with normal affect.   LABORATORY DATA:  EKG:  EKG is not ordered today.  Lab Results  Component Value Date   WBC 8.2 07/19/2017   HGB 8.4 (L) 07/19/2017   HCT 27.8 (L) 07/19/2017   PLT 276 07/19/2017   GLUCOSE 114 (H) 07/19/2017   CHOL 119 2017-07-20   TRIG 39 Jul 20, 2017   HDL 52 Jul 20, 2017   LDLCALC 59 07/20/2017   ALT 13 (L) 20-Jul-2017   AST 19 07/20/17   NA 139 07/19/2017   K 3.2 (L) 07/19/2017   CL 105 07/19/2017   CREATININE 0.84 07/19/2017   BUN 24 (H) 07/19/2017   CO2 27 07/19/2017   TSH 0.864 07/16/2017   INR 1.06 07/14/2017   HGBA1C 8.2 (H) Jul 20, 2017       BNP (last 3 results) Recent Labs    05/02/17 1850 07/16/17 0834  BNP 481.7* 239.6*    ProBNP (last 3 results) No results for input(s): PROBNP in the last 8760 hours.   Other Studies Reviewed Today:  2D Echo 20-Jul-2017 Study Conclusions - Left ventricle: The cavity size was normal. Systolic function was normal. The estimated ejection fraction was in the range of 60% to 65%. Wall motion was normal; there were no regional wall motion abnormalities. Left ventricular diastolic function parameters were normal. - Aortic valve: There was very mild stenosis. Valve area (Vmax): 1.82 cm^2.    Assessment/Plan:  1. Prior chest discomfort/dyspnea on exertionsuspected to be dueat least in partto symptomatic anemia-needs repeat lab. Not  planning on stopping smoking. Would favor conservative management. He reports his chest pain has improved.   2. CADs/p remote stenting 2007 - as above.No BB with recent wheezing/AECOPD - could be considered in  future but he's also had orthostasis-type dizziness recently with his anemia so would hold off. Continue statin. Recent LDL 59.  3. Paroxysmal atrial fib vs flutterper notes 04/2017 -remains in NSR. Back on Eliquis. Lab today. May need to consider EP referral since prior cardiology notes felt this represented atrial flutter - however with all the other issues and being back in NSR - would hold on this for now.  RFA may need to be considered if we continue to have ongoing anemia and ongoing issue of being on/off anticoagulation.  4. Hypokalemia - lab today  5. Mild AS - will need annual f/u.  6. Anemia - negative GI evaluation - to be referred to hematology  7. +C-diff - treated with Vancomycin  8. PAD - limited by leg pain - will get him back to Dr. Arbie Cookey.   9. Lung nodule/COPD - will get him back to pulmonary as well. He was to see Dr. Kendrick Fries in May.   I think his overall prognosis is quite tenuous at best. He is not wanting to stop smoking - this will be crucial for his overall prognosis. I think he would be a poor candidate for procedures if needed given significant COPD.   Current medicines are reviewed with the patient today.  The patient does not have concerns regarding medicines other than what has been noted above.  The following changes have been made:  See above.  Labs/ tests ordered today include:    Orders Placed This Encounter  Procedures  . Basic metabolic panel  . CBC  . Ambulatory referral to Hematology  . Ambulatory referral to Pulmonology  . Ambulatory referral to Vascular Surgery     Disposition:   FU with Dr. Katrinka Blazing in 3 to 4 months.    Patient is agreeable to this plan and will call if any problems develop in the interim.   SignedNorma Fredrickson, NP  08/15/2017 3:04 PM  [facility] [address] [place], [place]  [address] Phone: [phone] Fax: [phone]

## 2017-08-15 NOTE — Patient Instructions (Addendum)
We will be checking the following labs today - BMET and CBC   Medication Instructions:    Continue with your current medicines.     Testing/Procedures To Be Arranged:  N/A  Follow-Up:   See Dr. Katrinka BlazingSmith in about 3 to 4 months  Have made referral to Dr. Kendrick FriesMcQuaid for pulmonary follow up  Have made referral to Hematology for evaluation of anemia  Have made referral to Dr. Arbie CookeyEarly [facility] for claudication/PAD     Other Special Instructions:   Try to keep working on stopping smoking    If you need a refill on your cardiac medications before your next appointment, please call your pharmacy.   Call the [facility] office at [phone] if you have any questions, problems or concerns.

## 2017-08-16 ENCOUNTER — Telehealth: Payer: Self-pay

## 2017-08-16 DIAGNOSIS — D509 Iron deficiency anemia, unspecified: Secondary | ICD-10-CM

## 2017-08-16 LAB — BASIC METABOLIC PANEL
BUN/Creatinine Ratio: 14 (ref 10–24)
BUN: 11 mg/dL (ref 8–27)
CO2: 25 mmol/L (ref 20–29)
Calcium: 9.2 mg/dL (ref 8.6–10.2)
Chloride: 104 mmol/L (ref 96–106)
Creatinine, Ser: 0.81 mg/dL (ref 0.76–1.27)
GFR calc Af Amer: 102 mL/min/{1.73_m2} (ref >59)
GFR calc non Af Amer: 88 mL/min/{1.73_m2} (ref >59)
Glucose: 103 mg/dL — ABNORMAL HIGH (ref 65–99)
Potassium: 5.1 mmol/L (ref 3.5–5.2)
Sodium: 143 mmol/L (ref 134–144)

## 2017-08-16 LAB — CBC
Hematocrit: 27.9 % — ABNORMAL LOW (ref 37.5–51.0)
Hemoglobin: 8.2 g/dL — ABNORMAL LOW (ref 13.0–17.7)
MCH: 21.1 pg — ABNORMAL LOW (ref 26.6–33.0)
MCHC: 29.4 g/dL — ABNORMAL LOW (ref 31.5–35.7)
MCV: 72 fL — ABNORMAL LOW (ref 79–97)
Platelets: 300 10*3/uL (ref 150–379)
RBC: 3.88 x10E6/uL — ABNORMAL LOW (ref 4.14–5.80)
RDW: 17.2 % — ABNORMAL HIGH (ref 12.3–15.4)
WBC: 8 10*3/uL (ref 3.4–10.8)

## 2017-08-16 NOTE — Telephone Encounter (Signed)
Informed patient of results and verbal understanding expressed.  Repeat CBC scheduled next Wednesday. Patient does not have Hematology appointment yet. He was grateful for call and agrees with treatment plan.

## 2017-08-16 NOTE — Telephone Encounter (Signed)
-----   Message from Rosalio MacadamiaLori C Gerhardt, NP sent at 08/16/2017  8:28 AM EST ----- Ok to report. He remains anemic - he has been referred to hematology. Needs repeat CBC with us or with PCP in one week and otherwise would continue on current regimen as outlined at his visit.

## 2017-08-22 ENCOUNTER — Other Ambulatory Visit: Payer: Medicare Other | Admitting: *Deleted

## 2017-08-22 DIAGNOSIS — D509 Iron deficiency anemia, unspecified: Secondary | ICD-10-CM

## 2017-08-23 LAB — CBC
Hematocrit: 26.6 % — ABNORMAL LOW (ref 37.5–51.0)
Hemoglobin: 8.1 g/dL — ABNORMAL LOW (ref 13.0–17.7)
MCH: 21.9 pg — ABNORMAL LOW (ref 26.6–33.0)
MCHC: 30.5 g/dL — ABNORMAL LOW (ref 31.5–35.7)
MCV: 72 fL — ABNORMAL LOW (ref 79–97)
Platelets: 261 10*3/uL (ref 150–379)
RBC: 3.7 x10E6/uL — ABNORMAL LOW (ref 4.14–5.80)
RDW: 16.6 % — ABNORMAL HIGH (ref 12.3–15.4)
WBC: 6.9 10*3/uL (ref 3.4–10.8)

## 2017-08-26 ENCOUNTER — Emergency Department (HOSPITAL_COMMUNITY)
Admission: EM | Admit: 2017-08-26 | Discharge: 2017-08-26 | Disposition: A | Discharge status: Home / Self Care | Payer: Medicare Other | Attending: Emergency Medicine | Admitting: Emergency Medicine

## 2017-08-26 ENCOUNTER — Other Ambulatory Visit: Payer: Self-pay

## 2017-08-26 ENCOUNTER — Encounter (HOSPITAL_COMMUNITY): Payer: Self-pay | Admitting: *Deleted

## 2017-08-26 DIAGNOSIS — J449 Chronic obstructive pulmonary disease, unspecified: Secondary | ICD-10-CM | POA: Insufficient documentation

## 2017-08-26 DIAGNOSIS — I110 Hypertensive heart disease with heart failure: Secondary | ICD-10-CM | POA: Insufficient documentation

## 2017-08-26 DIAGNOSIS — D509 Iron deficiency anemia, unspecified: Secondary | ICD-10-CM | POA: Diagnosis not present

## 2017-08-26 DIAGNOSIS — I2510 Atherosclerotic heart disease of native coronary artery without angina pectoris: Secondary | ICD-10-CM | POA: Diagnosis not present

## 2017-08-26 DIAGNOSIS — T148XXA Other injury of unspecified body region, initial encounter: Secondary | ICD-10-CM

## 2017-08-26 DIAGNOSIS — R233 Spontaneous ecchymoses: Secondary | ICD-10-CM | POA: Diagnosis not present

## 2017-08-26 DIAGNOSIS — I5032 Chronic diastolic (congestive) heart failure: Secondary | ICD-10-CM | POA: Diagnosis not present

## 2017-08-26 DIAGNOSIS — F17210 Nicotine dependence, cigarettes, uncomplicated: Secondary | ICD-10-CM | POA: Insufficient documentation

## 2017-08-26 DIAGNOSIS — Z79899 Other long term (current) drug therapy: Secondary | ICD-10-CM | POA: Insufficient documentation

## 2017-08-26 DIAGNOSIS — I252 Old myocardial infarction: Secondary | ICD-10-CM | POA: Insufficient documentation

## 2017-08-26 DIAGNOSIS — Z7901 Long term (current) use of anticoagulants: Secondary | ICD-10-CM | POA: Insufficient documentation

## 2017-08-26 LAB — CBC
HCT: 26.9 % — ABNORMAL LOW (ref 39.0–52.0)
Hemoglobin: 7.6 g/dL — ABNORMAL LOW (ref 13.0–17.0)
MCH: 21.2 pg — ABNORMAL LOW (ref 26.0–34.0)
MCHC: 28.3 g/dL — ABNORMAL LOW (ref 30.0–36.0)
MCV: 75.1 fL — AB (ref 78.0–100.0)
PLATELETS: 246 10*3/uL (ref 150–400)
RBC: 3.58 MIL/uL — ABNORMAL LOW (ref 4.22–5.81)
RDW: 16.3 % — ABNORMAL HIGH (ref 11.5–15.5)
WBC: 6.4 10*3/uL (ref 4.0–10.5)

## 2017-08-26 NOTE — ED Triage Notes (Signed)
Pt reports states he woke up this morning and his eyelids were black and blue. Pt is on Eliquis.

## 2017-08-26 NOTE — ED Notes (Signed)
ED Provider at bedside. 

## 2017-08-26 NOTE — Discharge Instructions (Signed)
Keep your scheduled appointment with your physician September 05, 2017.  Tell office staff that you had blood work performed here today.  Your hemoglobin here today was 7.6   Return to the emergency department if you develop lightheadedness, fainting or have black or bloody bowel movements  or if concerned for any reason

## 2017-08-26 NOTE — ED Provider Notes (Signed)
[facility] EMERGENCY DEPARTMENT Provider Note   CSN: [id number] Arrival date & time: 08/26/17  0300     History   Chief Complaint Chief Complaint  Patient presents with  . Bleeding/Bruising    HPI John Marsh is a 72 y.o. male.  Patient noted ecchymoses over both upper eyelids this morning.  He denies pain.  No trauma.  States his vision was transiently slightly blurred but has since resolved back to normal.  Denies lightheadedness.  No blood per rectum.  No black stools no shortness of breath or chest pain no other associated symptoms.  No treatment prior to coming here.  States his left eyelid was slightly swollen this morning and swelling has improved without treatment and bruising is less pronounced than earlier today HPI  Past Medical History:  Diagnosis Date  . Anemia   . Atrial fibrillation (HCC)    Chadsvasc2=3  . COPD (chronic obstructive pulmonary disease) (HCC)   . Coronary heart disease   . Emphysema of lung (HCC)   . Hyperlipidemia   . Hypertension   . Iron deficiency anemia   . MI (myocardial infarction) (HCC)   . Multiple lung nodules on CT   . Nocturnal hypoxia   . Peripheral arterial disease (HCC)   . Shortness of breath dyspnea     Patient Active Problem List   Diagnosis Date Noted  . Abnormal colonoscopy   . Dyspnea 07/15/2017  . Coronary artery disease due to lipid rich plaque   . Chronic diastolic HF (heart failure) (HCC)   . Bronchiectasis with acute exacerbation (HCC)   . COPD exacerbation (HCC) 05/07/2017  . Leukocytosis 05/07/2017  . COPD (chronic obstructive pulmonary disease) (HCC) 05/07/2017  . Typical atrial flutter (HCC)   . Respiratory failure (HCC) 05/02/2017  . Lobar pneumonia (HCC) 05/01/2017  . Anemia 05/01/2017  . Protein-calorie malnutrition, severe 09/21/2016  . Elevated troponin 09/19/2016  . Acute on chronic respiratory failure with hypoxia (HCC) 09/19/2016  . Chest pain 06/30/2016  . PVD (peripheral vascular disease)  with claudication (HCC) 07/27/2015  . COPD with acute exacerbation (HCC) 07/03/2015  . Tachycardia 07/03/2015  . Chronic respiratory failure with hypoxia (HCC) 07/03/2015  . Shortness of breath 06/03/2015  . Allergic rhinitis 06/03/2015  . Emphysema lung (HCC) 03/04/2015  . Nocturnal hypoxia 03/04/2015  . Pulmonary nodules 08/10/2014  . Diabetes mellitus without complication (HCC) 08/16/2006  . HYPERTRIGLYCERIDEMIA 08/16/2006  . HLD (hyperlipidemia) 08/16/2006  . TOBACCO DEPENDENCE 08/16/2006  . Essential hypertension 08/16/2006  . CAD (coronary artery disease), native coronary artery 08/16/2006  . COPD (chronic obstructive pulmonary disease) with emphysema (HCC) 08/16/2006    Past Surgical History:  Procedure Laterality Date  . CHOLECYSTECTOMY    . COLONOSCOPY WITH PROPOFOL N/A 07/19/2017   Procedure: COLONOSCOPY WITH PROPOFOL;  Surgeon: Rachael Fee, MD;  Location: [facility];  Service: Endoscopy;  Laterality: N/A;  . ESOPHAGOGASTRODUODENOSCOPY (EGD) WITH PROPOFOL N/A 07/19/2017   Procedure: ESOPHAGOGASTRODUODENOSCOPY (EGD) WITH PROPOFOL;  Surgeon: Rachael Fee, MD;  Location: [facility];  Service: Endoscopy;  Laterality: N/A;  . NASAL SEPTUM SURGERY    . PERIPHERAL VASCULAR CATHETERIZATION N/A 07/27/2015   Procedure: Abdominal Aortogram;  Surgeon: Chuck Hint, MD;  Location: [facility] INVASIVE CV LAB;  Service: Cardiovascular;  Laterality: N/A;  . PERIPHERAL VASCULAR CATHETERIZATION Bilateral 07/27/2015   Procedure: Lower Extremity Angiography;  Surgeon: Chuck Hint, MD;  Location: [facility] INVASIVE CV LAB;  Service: Cardiovascular;  Laterality: Bilateral;  . stent placed  Home Medications    Prior to Admission medications   Medication Sig Start Date End Date Taking? Authorizing Provider  acetaminophen (TYLENOL) 325 MG tablet Take 650 mg by mouth every 6 (six) hours as needed for mild pain.    [provider]  albuterol (PROVENTIL HFA;VENTOLIN  HFA) 108 (90 Base) MCG/ACT inhaler Inhale 2 puffs into the lungs every 6 (six) hours as needed for wheezing or shortness of breath. 02/05/17   Roslynn Amble, MD  apixaban (ELIQUIS) 5 MG TABS tablet Take 1 tablet (5 mg total) 2 (two) times daily by mouth. 05/06/17   Rolly Salter, MD  ATROVENT HFA 17 MCG/ACT inhaler Take 2 puffs by mouth 4 (four) times daily. 07/10/17   [provider]  benzonatate (TESSALON) 100 MG capsule Take 1 capsule (100 mg total) 3 (three) times daily by mouth. 05/06/17   Rolly Salter, MD  budesonide (PULMICORT) 0.5 MG/2ML nebulizer solution Take 2 mLs (0.5 mg total) by nebulization 2 (two) times daily. 03/07/17   Roslynn Amble, MD  diltiazem (CARDIZEM CD) 360 MG 24 hr capsule Take 1 capsule (360 mg total) daily by mouth. 05/07/17   Rolly Salter, MD  feeding supplement, ENSURE ENLIVE, (ENSURE ENLIVE) LIQD Take 237 mLs by mouth 3 (three) times daily between meals. 09/26/16   Narda Bonds, MD  fluticasone (FLONASE) 50 MCG/ACT nasal spray Place 2 sprays into both nostrils daily as needed for allergies or rhinitis. 09/26/16   Narda Bonds, MD  formoterol (PERFOROMIST) 20 MCG/2ML nebulizer solution Take 2 mLs (20 mcg total) by nebulization 2 (two) times daily. 02/13/17   Roslynn Amble, MD  gabapentin (NEURONTIN) 100 MG capsule Take 1 capsule (100 mg total) 2 (two) times daily by mouth. 05/06/17   Rolly Salter, MD  guaiFENesin (MUCINEX) 600 MG 12 hr tablet Take 1 tablet (600 mg total) by mouth 2 (two) times daily. 09/26/16   Narda Bonds, MD  menthol-cetylpyridinium (CEPACOL) 3 MG lozenge Take 1 lozenge (3 mg total) as needed by mouth for sore throat. 05/06/17   Rolly Salter, MD  montelukast (SINGULAIR) 5 MG chewable tablet CHEW 1 TABLET BY MOUTH AT BEDTIME 02/01/16   Roslynn Amble, MD  nitroGLYCERIN (NITROSTAT) 0.4 MG SL tablet Place 0.4 mg under the tongue every 5 (five) minutes as needed for chest pain. x3 doses as needed for chest pain     [provider]  pantoprazole (PROTONIX) 40 MG tablet Take 1 tablet (40 mg total) by mouth daily. 07/01/16   Erick Blinks, MD  pramipexole (MIRAPEX) 0.125 MG tablet Take 1 tablet by mouth at bedtime. 06/21/16   [provider]  predniSONE (DELTASONE) 10 MG tablet Take 60mg  daily for 3 days,Take 40mg  daily for 3days,Take 30mg  daily for 3days,Take 20mg  daily for 3days,Take 10mg  daily for 3days, then stop. 05/09/17   Vassie Loll, MD  rosuvastatin (CRESTOR) 20 MG tablet Take 20 mg by mouth at bedtime.    [provider]  sodium chloride (OCEAN) 0.65 % SOLN nasal spray Place 1 spray as needed into both nostrils for congestion (nose irritation). 05/06/17   Rolly Salter, MD  Spacer/Aero-Holding Chambers (AEROCHAMBER Z-STAT PLUS) inhaler Use as instructed 03/04/15   Roslynn Amble, MD  tamsulosin (FLOMAX) 0.4 MG CAPS capsule Take 0.4 mg by mouth.    [provider]  triamcinolone (NASACORT ALLERGY 24HR) 55 MCG/ACT AERO nasal inhaler Place 2 sprays daily as needed into the nose (allergies).  [provider]    Family History Family History  Problem Relation Age of Onset  . Heart disease Mother        died at 81  . Heart disease Father   . Heart disease Brother   . Heart disease Sister   . Lung disease Neg Hx     Social History Social History   Tobacco Use  . Smoking status: Current Every Day Smoker    Packs/day: 0.25    Years: 55.00    Pack years: 13.75    Types: Cigarettes    Start date: 08/01/1959  . Smokeless tobacco: Never Used  . Tobacco comment: smokes 4-5 cigerettes/day  Substance Use Topics  . Alcohol use: No    Alcohol/week: 0.0 oz  . Drug use: No     Allergies   Patient has no known allergies.   Review of Systems Review of Systems  Constitutional: Negative.   HENT: Negative.   Respiratory:       Oxygen dependent  Cardiovascular: Negative.   Gastrointestinal: Negative.   Musculoskeletal: Negative.   Skin:  Negative.   Neurological: Negative.   Hematological: Bruises/bleeds easily.  Psychiatric/Behavioral: Negative.      Physical Exam Updated Vital Signs BP 135/80 (BP Location: Right Arm)   Pulse 86   Temp 97.9 F (36.6 C) (Oral)   Resp 20   Ht 6' (1.829 m)   Wt 74.4 kg (164 lb)   SpO2 100%   BMI 22.24 kg/m   Physical Exam  Constitutional: He is oriented to person, place, and time. He appears well-developed and well-nourished. No distress.  HENT:  Head: Normocephalic and atraumatic.  Eyes: Conjunctivae and EOM are normal. Pupils are equal, round, and reactive to light.  Upper eyelids ecchymotic at lateral aspects, bilaterally.  No swelling.  No hyphema.  No pain on extraocular motion  Neck: Neck supple. No tracheal deviation present. No thyromegaly present.  Cardiovascular: Normal rate and regular rhythm.  No murmur heard. Pulmonary/Chest: Effort normal and breath sounds normal.  Abdominal: Soft. Bowel sounds are normal. He exhibits no distension. There is no tenderness.  Musculoskeletal: Normal range of motion. He exhibits no edema or tenderness.  Neurological: He is alert and oriented to person, place, and time. Coordination normal.  Skin: Skin is warm and dry. No rash noted.  Psychiatric: He has a normal mood and affect.  Nursing note and vitals reviewed.    ED Treatments / Results  Labs (all labs ordered are listed, but only abnormal results are displayed) Labs Reviewed  CBC    EKG  EKG Interpretation None       Radiology No results found.  Procedures Procedures (including critical care time)  Medications Ordered in ED Medications - No data to display   Initial Impression / Assessment and Plan / ED Course  I have reviewed the triage vital signs and the nursing notes.  Pertinent labs & imaging results that were available during my care of the patient were reviewed by me and considered in my medical decision making (see chart for details).     8:30  PM patient remains asymptomatic.  Not lightheaded.  No further bruising.  Hemoglobin slightly lower than 4 days ago.  Does not require transfusion.  He is encouraged to keep his scheduled point with his physician March 20 return precautions given for lightheadedness fainting, black or bloody stools or if concern for any reason  Final Clinical Impressions(s) / ED Diagnoses  Diagnoses #1 bruising #2 microcytic anemia  Final diagnoses:  None    ED Discharge Orders    None       Doug SouJacubowitz, Alycen Mack, MD 08/26/17 2038

## 2017-08-31 ENCOUNTER — Other Ambulatory Visit: Payer: Self-pay | Admitting: *Deleted

## 2017-08-31 MED ORDER — FORMOTEROL FUMARATE 20 MCG/2ML IN NEBU: 20.0000 ug | INHALATION_SOLUTION | Freq: Two times a day (BID) | RESPIRATORY_TRACT | 3 refills | Status: AC

## 2017-09-05 ENCOUNTER — Inpatient Hospital Stay (HOSPITAL_COMMUNITY): Payer: Medicare Other

## 2017-09-05 ENCOUNTER — Inpatient Hospital Stay (HOSPITAL_COMMUNITY): Payer: Medicare Other | Attending: Hematology | Admitting: Hematology

## 2017-09-05 ENCOUNTER — Other Ambulatory Visit: Payer: Self-pay

## 2017-09-05 ENCOUNTER — Encounter (HOSPITAL_COMMUNITY): Payer: Self-pay | Admitting: Hematology

## 2017-09-05 VITALS — BP 143/54 | HR 91 | Temp 97.7°F | Resp 22 | Wt 162.9 lb

## 2017-09-05 DIAGNOSIS — D509 Iron deficiency anemia, unspecified: Secondary | ICD-10-CM

## 2017-09-05 DIAGNOSIS — I739 Peripheral vascular disease, unspecified: Secondary | ICD-10-CM | POA: Diagnosis not present

## 2017-09-05 DIAGNOSIS — I10 Essential (primary) hypertension: Secondary | ICD-10-CM | POA: Diagnosis not present

## 2017-09-05 DIAGNOSIS — R209 Unspecified disturbances of skin sensation: Secondary | ICD-10-CM | POA: Diagnosis not present

## 2017-09-05 LAB — CBC WITH DIFFERENTIAL/PLATELET
BASOS PCT: 1 %
Basophils Absolute: 0.1 10*3/uL (ref 0.0–0.1)
Eosinophils Absolute: 0.1 10*3/uL (ref 0.0–0.7)
Eosinophils Relative: 1 %
HEMATOCRIT: 28.6 % — AB (ref 39.0–52.0)
Hemoglobin: 7.9 g/dL — ABNORMAL LOW (ref 13.0–17.0)
Lymphocytes Relative: 14 %
Lymphs Abs: 1 10*3/uL (ref 0.7–4.0)
MCH: 20.8 pg — ABNORMAL LOW (ref 26.0–34.0)
MCHC: 27.6 g/dL — ABNORMAL LOW (ref 30.0–36.0)
MCV: 75.5 fL — ABNORMAL LOW (ref 78.0–100.0)
MONO ABS: 0.5 10*3/uL (ref 0.1–1.0)
Monocytes Relative: 8 %
NEUTROS ABS: 5.1 10*3/uL (ref 1.7–7.7)
NEUTROS PCT: 76 %
Platelets: 279 10*3/uL (ref 150–400)
RBC: 3.79 MIL/uL — AB (ref 4.22–5.81)
RDW: 16.2 % — AB (ref 11.5–15.5)
WBC: 6.7 10*3/uL (ref 4.0–10.5)

## 2017-09-05 LAB — RETICULOCYTES
RBC.: 3.79 MIL/uL — ABNORMAL LOW (ref 4.22–5.81)
RETIC COUNT ABSOLUTE: 37.9 10*3/uL (ref 19.0–186.0)
Retic Ct Pct: 1 % (ref 0.4–3.1)

## 2017-09-05 LAB — TSH: TSH: 1.391 u[IU]/mL (ref 0.350–4.500)

## 2017-09-05 LAB — FOLATE: FOLATE: 28.4 ng/mL (ref >5.9)

## 2017-09-05 LAB — LACTATE DEHYDROGENASE: LDH: 142 U/L (ref 98–192)

## 2017-09-05 NOTE — Progress Notes (Signed)
CONSULT NOTE  Patient Care Team: Chesley Noon, MD as PCP - General (Family Medicine) Belva Crome, MD as PCP - Cardiology (Cardiology)  CHIEF COMPLAINTS/PURPOSE OF CONSULTATION:  Further workup of anemia.  HISTORY OF PRESENTING ILLNESS:  John Marsh 72 y.o. male is seen today for further workup of anemia.  His most recent hemoglobin was 7.6 on 08/26/2017.  He has been anemic with a hemoglobin between 7 and 8 since October 2018.  He was admitted to the hospital during that time and was given 1 unit of blood transfusion.  Patient could not complete the transfusion secondary to reaction. Colonoscopy was done on 07/19/2017 but did not reveal any active source of bleeding.  EGD done on 07/19/2017 did not reveal any bleeding source.  Random biopsies did not show any pathology.  Stool for occult blood test was done in November 2018 which was negative.  He  has not taken iron supplements in the past.  He denies any bleeding per rectum or melena.  He denies any fevers, night sweats or weight loss.  He has some tingling and pain sensation in the feet secondary to poor circulation.  He cannot walk more than 35 feet because of peripheral vascular disease.  He is accompanied by his wife today.   MEDICAL HISTORY:  Past Medical History:  Diagnosis Date  . Anemia   . Atrial fibrillation ([place])    Chadsvasc2=3  . COPD (chronic obstructive pulmonary disease) ([place])   . Coronary heart disease   . Emphysema of lung ([place])   . Hyperlipidemia   . Hypertension   . Iron deficiency anemia   . MI (myocardial infarction) ([place])   . Multiple lung nodules on CT   . Nocturnal hypoxia   . Peripheral arterial disease ([place])   . Shortness of breath dyspnea     SURGICAL HISTORY: Past Surgical History:  Procedure Laterality Date  . CHOLECYSTECTOMY    . COLONOSCOPY WITH PROPOFOL N/A 07/19/2017   Procedure: COLONOSCOPY WITH PROPOFOL;  Surgeon: Milus Banister, MD;  Location: [facility];  Service: Endoscopy;   Laterality: N/A;  . ESOPHAGOGASTRODUODENOSCOPY (EGD) WITH PROPOFOL N/A 07/19/2017   Procedure: ESOPHAGOGASTRODUODENOSCOPY (EGD) WITH PROPOFOL;  Surgeon: Milus Banister, MD;  Location: [facility];  Service: Endoscopy;  Laterality: N/A;  . NASAL SEPTUM SURGERY    . PERIPHERAL VASCULAR CATHETERIZATION N/A 07/27/2015   Procedure: Abdominal Aortogram;  Surgeon: Angelia Mould, MD;  Location: [place] [facility];  Service: Cardiovascular;  Laterality: N/A;  . PERIPHERAL VASCULAR CATHETERIZATION Bilateral 07/27/2015   Procedure: Lower Extremity Angiography;  Surgeon: Angelia Mould, MD;  Location: [place] [facility];  Service: Cardiovascular;  Laterality: Bilateral;  . stent placed      SOCIAL HISTORY: Social History   Socioeconomic History  . Marital status: Married    Spouse name: Not on file  . Number of children: Not on file  . Years of education: Not on file  . Highest education level: Not on file  Social Needs  . Financial resource strain: Not on file  . Food insecurity - worry: Not on file  . Food insecurity - inability: Not on file  . Transportation needs - medical: Not on file  . Transportation needs - non-medical: Not on file  Occupational History  . Occupation: retired  Tobacco Use  . Smoking status: Current Every Day Smoker    Packs/day: 0.20    Years: 54.00    Pack years: 10.80    Types:  Cigarettes    Start date: 08/01/1959  . Smokeless tobacco: Never Used  . Tobacco comment: smokes 4-5 cigerettes/day  Substance and Sexual Activity  . Alcohol use: No    Alcohol/week: 0.0 oz  . Drug use: No  . Sexual activity: No  Other Topics Concern  . Not on file  Social History Narrative   Originally from [place]. Previously has lived in [place]. He moved to [facility] in [place]. He serve in [place]. He served in Psychologist, prison and probation services. He has been to [place], [place], [place], several countries in [place], countries in [place], [place], & multiple [place]. 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 He has a dog, 7 cats, & 2 horses. Remote exposure to a parrot for a few years in a different home. No mold exposure. No hot tub exposure. 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     FAMILY HISTORY: Family History  Problem Relation Age of Onset  . Heart disease Mother        died at 109  . Heart disease Father   . Heart disease Sister        from rheumatic fever as a child  . Heart disease Paternal Grandfather     ALLERGIES:  has No Known Allergies.  MEDICATIONS:  Current Outpatient Medications  Medication Sig Dispense Refill  . acetaminophen (TYLENOL) 325 MG tablet Take 650 mg by mouth every 6 (six) hours as needed for mild pain.    Marland Kitchen albuterol (PROVENTIL HFA;VENTOLIN HFA) 108 (90 Base) MCG/ACT inhaler Inhale 2 puffs into the lungs every 6 (six) hours as needed for wheezing or shortness of breath. 1 Inhaler 6  . apixaban (ELIQUIS) 5 MG TABS tablet Take 1 tablet (5 mg total) 2 (two) times daily by mouth. 60 tablet 0  . ATROVENT HFA 17 MCG/ACT inhaler Take 2 puffs by mouth 4 (four) times daily.  5  . benzonatate (TESSALON) 100 MG capsule Take 1 capsule (100 mg total) 3 (three) times daily by mouth. 20 capsule 0  . budesonide (PULMICORT) 0.5 MG/2ML nebulizer solution Take 2 mLs (0.5 mg total) by nebulization 2 (two) times daily. 60 mL 3  . diltiazem (CARDIZEM CD) 360 MG 24 hr capsule Take 1 capsule (360 mg total) daily by mouth. 60 capsule 0  . feeding supplement, ENSURE ENLIVE, (ENSURE ENLIVE) LIQD Take 237 mLs by mouth 3 (three) times daily between meals. 30 Bottle 0  . fluticasone (FLONASE) 50 MCG/ACT nasal spray Place 2 sprays into both nostrils daily as needed for allergies or rhinitis. 16 g 0  . formoterol (PERFOROMIST) 20 MCG/2ML nebulizer solution Take 2 mLs (20 mcg total) by nebulization 2 (two) times daily. DX code: J43.8 120 mL 3  .  gabapentin (NEURONTIN) 100 MG capsule Take 1 capsule (100 mg total) 2 (two) times daily by mouth. 60 capsule 0  . guaiFENesin (MUCINEX) 600 MG 12 hr tablet Take 1 tablet (600 mg total) by mouth 2 (two) times daily. 30 tablet 0  . HYDROcodone-acetaminophen (NORCO/VICODIN) 5-325 MG tablet Take by mouth.    . menthol-cetylpyridinium (CEPACOL) 3 MG lozenge Take 1 lozenge (3 mg total) as needed by mouth for sore throat. 100 tablet 12  . montelukast (SINGULAIR) 5 MG chewable tablet CHEW 1 TABLET BY MOUTH AT BEDTIME 30 tablet 3  . nitroGLYCERIN (NITROSTAT) 0.4 MG SL tablet Place 0.4 mg under the tongue  every 5 (five) minutes as needed for chest pain. x3 doses as needed for chest pain    . pantoprazole (PROTONIX) 40 MG tablet Take 1 tablet (40 mg total) by mouth daily. 30 tablet 0  . pramipexole (MIRAPEX) 0.125 MG tablet Take 1 tablet by mouth at bedtime.    . predniSONE (DELTASONE) 10 MG tablet Take '60mg'$  daily for 3 days,Take '40mg'$  daily for 3days,Take '30mg'$  daily for 3days,Take '20mg'$  daily for 3days,Take '10mg'$  daily for 3days, then stop. 40 tablet 0  . rosuvastatin (CRESTOR) 20 MG tablet Take 20 mg by mouth at bedtime.    . sodium chloride (OCEAN) 0.65 % SOLN nasal spray Place 1 spray as needed into both nostrils for congestion (nose irritation). 2 Bottle 0  . Spacer/Aero-Holding Chambers (AEROCHAMBER Z-STAT PLUS) inhaler Use as instructed 1 each 0  . tamsulosin (FLOMAX) 0.4 MG CAPS capsule Take 0.4 mg by mouth.    . triamcinolone (NASACORT ALLERGY 24HR) 55 MCG/ACT AERO nasal inhaler Place 2 sprays daily as needed into the nose (allergies).      No current facility-administered medications for this visit.     REVIEW OF SYSTEMS:   Constitutional: Denies fevers, chills or abnormal night sweats Eyes: Denies blurriness of vision, double vision or watery eyes Ears, nose, mouth, throat, and face: Denies mucositis or sore throat Respiratory: Denies cough,  wheezes he has dyspnea on exertion. Cardiovascular:  Denies palpitation, chest discomfort or lower extremity swelling.  He has lower extremity pains on walking. Gastrointestinal:  Denies nausea, heartburn or change in bowel habits Skin: Denies abnormal skin rashes Lymphatics: Denies new lymphadenopathy or easy bruising Neurological:Denies numbness, tingling or new weaknesses Behavioral/Psych: Mood is stable, no new changes  All other systems were reviewed with the patient and are negative.  PHYSICAL EXAMINATION: ECOG PERFORMANCE STATUS: 2 - Symptomatic, <50% confined to bed  Vitals:   09/05/17 1320  BP: (!) 143/54  Pulse: 91  Resp: (!) 22  Temp: 97.7 F (36.5 C)  SpO2: 100%   Filed Weights   09/05/17 1320  Weight: 162 lb 14.4 oz (73.9 kg)    GENERAL:alert, no distress and comfortable SKIN: skin color, texture, turgor are normal, no rashes or significant lesions EYES: normal, conjunctiva are pink and non-injected, sclera clear OROPHARYNX: Edentulous with no oropharyngeal lesions.   NECK: supple, thyroid normal size, non-tender, without nodularity LYMPH:  no palpable lymphadenopathy in the cervical, axillary or inguinal LUNGS: clear to auscultation and percussion with normal breathing effort HEART: regular rate & rhythm and no murmurs and no lower extremity edema ABDOMEN:abdomen soft, non-tender and normal bowel sounds Musculoskeletal:no cyanosis of digits and no clubbing  PSYCH: alert & oriented x 3 with fluent speech NEURO: no focal motor/sensory deficits  LABORATORY DATA:  I have reviewed the data as listed Recent Results (from the past 2160 hour(s))  CBC with Differential     Status: Abnormal   Collection Time: 07/14/17  6:59 PM  Result Value Ref Range   WBC 9.4 4.0 - 10.5 K/uL   RBC 3.85 (L) 4.22 - 5.81 MIL/uL   Hemoglobin 8.6 (L) 13.0 - 17.0 g/dL   HCT 29.0 (L) 39.0 - 52.0 %   MCV 75.3 (L) 78.0 - 100.0 fL   MCH 22.3 (L) 26.0 - 34.0 pg   MCHC 29.7 (L) 30.0 - 36.0 g/dL   RDW 19.5 (H) 11.5 - 15.5 %   Platelets 265  150 - 400 K/uL   Neutrophils Relative % 88 %   Neutro Abs 8.4 (H)  1.7 - 7.7 K/uL   Lymphocytes Relative 8 %   Lymphs Abs 0.7 0.7 - 4.0 K/uL   Monocytes Relative 3 %   Monocytes Absolute 0.3 0.1 - 1.0 K/uL   Eosinophils Relative 1 %   Eosinophils Absolute 0.1 0.0 - 0.7 K/uL   Basophils Relative 0 %   Basophils Absolute 0.0 0.0 - 0.1 K/uL  Comprehensive metabolic panel     Status: Abnormal   Collection Time: 07/14/17  6:59 PM  Result Value Ref Range   Sodium 141 135 - 145 mmol/L   Potassium 3.7 3.5 - 5.1 mmol/L   Chloride 104 101 - 111 mmol/L   CO2 27 22 - 32 mmol/L   Glucose, Bld 203 (H) 65 - 99 mg/dL   BUN 18 6 - 20 mg/dL   Creatinine, Ser 0.78 0.61 - 1.24 mg/dL   Calcium 8.8 (L) 8.9 - 10.3 mg/dL   Total Protein 6.9 6.5 - 8.1 g/dL   Albumin 4.0 3.5 - 5.0 g/dL   AST 20 15 - 41 U/L   ALT 15 (L) 17 - 63 U/L   Alkaline Phosphatase 61 38 - 126 U/L   Total Bilirubin 0.2 (L) 0.3 - 1.2 mg/dL   GFR calc non Af Amer >60 >60 mL/min   GFR calc Af Amer >60 >60 mL/min    Comment: (NOTE) The eGFR has been calculated using the CKD EPI equation. This calculation has not been validated in all clinical situations. eGFR's persistently <60 mL/min signify possible Chronic Kidney Disease.    Anion gap 10 5 - 15  Lipase, blood     Status: None   Collection Time: 07/14/17  6:59 PM  Result Value Ref Range   Lipase 41 11 - 51 U/L  Protime-INR     Status: None   Collection Time: 07/14/17  6:59 PM  Result Value Ref Range   Prothrombin Time 13.7 11.4 - 15.2 seconds   INR 1.06   D-dimer, quantitative (not at [facility])     Status: None   Collection Time: 07/14/17  6:59 PM  Result Value Ref Range   D-Dimer, Quant 0.30 0.00 - 0.50 ug/mL-FEU    Comment: (NOTE) At the manufacturer cut-off of 0.50 ug/mL FEU, this assay has been documented to exclude PE with a sensitivity and negative predictive value of 97 to 99%.  At this time, this assay has not been approved by the FDA to exclude DVT/VTE. Results  should be correlated with clinical presentation.   I-Stat Troponin, ED (not at [facility])     Status: None   Collection Time: 07/14/17  7:10 PM  Result Value Ref Range   Troponin i, poc 0.00 0.00 - 0.08 ng/mL   Comment 3            Comment: Due to the release kinetics of cTnI, a negative result within the first hours of the onset of symptoms does not rule out myocardial infarction with certainty. If myocardial infarction is still suspected, repeat the test at appropriate intervals.   Glucose, capillary     Status: Abnormal   Collection Time: 07/14/17 10:42 PM  Result Value Ref Range   Glucose-Capillary 265 (H) 65 - 99 mg/dL  Troponin I     Status: None   Collection Time: 07/14/17 10:48 PM  Result Value Ref Range   Troponin I <0.03 <0.03 ng/mL  Glucose, capillary     Status: Abnormal   Collection Time: 07/15/17  1:29 AM  Result Value Ref Range  Glucose-Capillary 326 (H) 65 - 99 mg/dL  Hemoglobin A1c     Status: Abnormal   Collection Time: 07/15/17  5:05 AM  Result Value Ref Range   Hgb A1c MFr Bld 8.2 (H) 4.8 - 5.6 %    Comment: (NOTE) Pre diabetes:          5.7%-6.4% Diabetes:              >6.4% Glycemic control for   <7.0% adults with diabetes    Mean Plasma Glucose 188.64 mg/dL    Comment: Performed at [facility] [address], [place], [place] [address]  CBC     Status: Abnormal   Collection Time: 07/15/17  5:05 AM  Result Value Ref Range   WBC 7.8 4.0 - 10.5 K/uL   RBC 3.50 (L) 4.22 - 5.81 MIL/uL   Hemoglobin 7.9 (L) 13.0 - 17.0 g/dL   HCT 26.1 (L) 39.0 - 52.0 %   MCV 74.6 (L) 78.0 - 100.0 fL   MCH 22.6 (L) 26.0 - 34.0 pg   MCHC 30.3 30.0 - 36.0 g/dL   RDW 19.7 (H) 11.5 - 15.5 %   Platelets 250 150 - 400 K/uL  Comprehensive metabolic panel     Status: Abnormal   Collection Time: 07/15/17  5:05 AM  Result Value Ref Range   Sodium 140 135 - 145 mmol/L   Potassium 3.8 3.5 - 5.1 mmol/L   Chloride 106 101 - 111 mmol/L   CO2 26 22 - 32 mmol/L   Glucose, Bld  243 (H) 65 - 99 mg/dL   BUN 20 6 - 20 mg/dL   Creatinine, Ser 0.75 0.61 - 1.24 mg/dL   Calcium 8.7 (L) 8.9 - 10.3 mg/dL   Total Protein 6.1 (L) 6.5 - 8.1 g/dL   Albumin 3.3 (L) 3.5 - 5.0 g/dL   AST 19 15 - 41 U/L   ALT 13 (L) 17 - 63 U/L   Alkaline Phosphatase 53 38 - 126 U/L   Total Bilirubin <0.1 (L) 0.3 - 1.2 mg/dL   GFR calc non Af Amer >60 >60 mL/min   GFR calc Af Amer >60 >60 mL/min    Comment: (NOTE) The eGFR has been calculated using the CKD EPI equation. This calculation has not been validated in all clinical situations. eGFR's persistently <60 mL/min signify possible Chronic Kidney Disease.    Anion gap 8 5 - 15  Troponin I     Status: None   Collection Time: 07/15/17  5:05 AM  Result Value Ref Range   Troponin I <0.03 <0.03 ng/mL  Lipid panel     Status: None   Collection Time: 07/15/17  5:05 AM  Result Value Ref Range   Cholesterol 119 0 - 200 mg/dL   Triglycerides 39 <150 mg/dL   HDL 52 >40 mg/dL   Total CHOL/HDL Ratio 2.3 RATIO   VLDL 8 0 - 40 mg/dL   LDL Cholesterol 59 0 - 99 mg/dL    Comment:        Total Cholesterol/HDL:CHD Risk Coronary Heart Disease Risk Table                     Men   Women  1/2 Average Risk   3.4   3.3  Average Risk       5.0   4.4  2 X Average Risk   9.6   7.1  3 X Average Risk  23.4   11.0  Use the calculated Patient Ratio above and the CHD Risk Table to determine the patient's CHD Risk.        ATP III CLASSIFICATION (LDL):  <100     mg/dL   Optimal  100-129  mg/dL   Near or Above                    Optimal  130-159  mg/dL   Borderline  160-189  mg/dL   High  >190     mg/dL   Very High   Glucose, capillary     Status: Abnormal   Collection Time: 07/15/17  5:18 AM  Result Value Ref Range   Glucose-Capillary 253 (H) 65 - 99 mg/dL  Glucose, capillary     Status: Abnormal   Collection Time: 07/15/17  7:44 AM  Result Value Ref Range   Glucose-Capillary 218 (H) 65 - 99 mg/dL  Troponin I     Status: None   Collection  Time: 07/15/17 10:33 AM  Result Value Ref Range   Troponin I <0.03 <0.03 ng/mL  Glucose, capillary     Status: Abnormal   Collection Time: 07/15/17 11:51 AM  Result Value Ref Range   Glucose-Capillary 163 (H) 65 - 99 mg/dL  ECHOCARDIOGRAM COMPLETE     Status: None   Collection Time: 07/15/17  1:10 PM  Result Value Ref Range   Weight 2,582.03 oz   Height 72 in   BP 122/60 mmHg  Glucose, capillary     Status: Abnormal   Collection Time: 07/15/17  5:10 PM  Result Value Ref Range   Glucose-Capillary 218 (H) 65 - 99 mg/dL  Glucose, capillary     Status: Abnormal   Collection Time: 07/15/17 10:46 PM  Result Value Ref Range   Glucose-Capillary 242 (H) 65 - 99 mg/dL   Comment 1 Notify RN   Glucose, capillary     Status: Abnormal   Collection Time: 07/16/17  7:35 AM  Result Value Ref Range   Glucose-Capillary 173 (H) 65 - 99 mg/dL  TSH     Status: None   Collection Time: 07/16/17  8:34 AM  Result Value Ref Range   TSH 0.864 0.350 - 4.500 uIU/mL    Comment: Performed by a 3rd Generation assay with a functional sensitivity of <=0.01 uIU/mL.  T4, free     Status: None   Collection Time: 07/16/17  8:34 AM  Result Value Ref Range   Free T4 0.78 0.61 - 1.12 ng/dL    Comment: (NOTE) Biotin ingestion may interfere with free T4 tests. If the results are inconsistent with the TSH level, previous test results, or the clinical presentation, then consider biotin interference. If needed, order repeat testing after stopping biotin. Performed at [facility], [place] [address], [place], [place] [address]   CBC     Status: Abnormal   Collection Time: 07/16/17  8:34 AM  Result Value Ref Range   WBC 15.2 (H) 4.0 - 10.5 K/uL   RBC 3.54 (L) 4.22 - 5.81 MIL/uL   Hemoglobin 8.0 (L) 13.0 - 17.0 g/dL   HCT 26.3 (L) 39.0 - 52.0 %   MCV 74.3 (L) 78.0 - 100.0 fL   MCH 22.6 (L) 26.0 - 34.0 pg   MCHC 30.4 30.0 - 36.0 g/dL   RDW 19.6 (H) 11.5 - 15.5 %   Platelets 288 150 - 400 K/uL  Brain  natriuretic peptide     Status: Abnormal   Collection Time: 07/16/17  8:34  AM  Result Value Ref Range   B Natriuretic Peptide 239.6 (H) 0.0 - 100.0 pg/mL  Glucose, capillary     Status: Abnormal   Collection Time: 07/16/17 11:36 AM  Result Value Ref Range   Glucose-Capillary 203 (H) 65 - 99 mg/dL  Respiratory Panel by PCR     Status: None   Collection Time: 07/16/17 12:29 PM  Result Value Ref Range   Adenovirus NOT DETECTED NOT DETECTED   Coronavirus 229E NOT DETECTED NOT DETECTED   Coronavirus HKU1 NOT DETECTED NOT DETECTED   Coronavirus NL63 NOT DETECTED NOT DETECTED   Coronavirus OC43 NOT DETECTED NOT DETECTED   Metapneumovirus NOT DETECTED NOT DETECTED   Rhinovirus / Enterovirus NOT DETECTED NOT DETECTED   Influenza A NOT DETECTED NOT DETECTED   Influenza B NOT DETECTED NOT DETECTED   Parainfluenza Virus 1 NOT DETECTED NOT DETECTED   Parainfluenza Virus 2 NOT DETECTED NOT DETECTED   Parainfluenza Virus 3 NOT DETECTED NOT DETECTED   Parainfluenza Virus 4 NOT DETECTED NOT DETECTED   Respiratory Syncytial Virus NOT DETECTED NOT DETECTED   Bordetella pertussis NOT DETECTED NOT DETECTED   Chlamydophila pneumoniae NOT DETECTED NOT DETECTED   Mycoplasma pneumoniae NOT DETECTED NOT DETECTED    Comment: Performed at [facility], [place] [address], [place], [place] [address]  Glucose, capillary     Status: Abnormal   Collection Time: 07/16/17  4:08 PM  Result Value Ref Range   Glucose-Capillary 173 (H) 65 - 99 mg/dL  Glucose, capillary     Status: Abnormal   Collection Time: 07/16/17  8:52 PM  Result Value Ref Range   Glucose-Capillary 209 (H) 65 - 99 mg/dL  Basic metabolic panel     Status: Abnormal   Collection Time: 07/17/17  5:26 AM  Result Value Ref Range   Sodium 139 135 - 145 mmol/L   Potassium 3.3 (L) 3.5 - 5.1 mmol/L   Chloride 103 101 - 111 mmol/L   CO2 28 22 - 32 mmol/L   Glucose, Bld 147 (H) 65 - 99 mg/dL   BUN 27 (H) 6 - 20 mg/dL   Creatinine, Ser 0.80 0.61 -  1.24 mg/dL   Calcium 8.7 (L) 8.9 - 10.3 mg/dL   GFR calc non Af Amer >60 >60 mL/min   GFR calc Af Amer >60 >60 mL/min    Comment: (NOTE) The eGFR has been calculated using the CKD EPI equation. This calculation has not been validated in all clinical situations. eGFR's persistently <60 mL/min signify possible Chronic Kidney Disease.    Anion gap 8 5 - 15  CBC     Status: Abnormal   Collection Time: 07/17/17  5:26 AM  Result Value Ref Range   WBC 10.0 4.0 - 10.5 K/uL   RBC 3.36 (L) 4.22 - 5.81 MIL/uL   Hemoglobin 7.6 (L) 13.0 - 17.0 g/dL   HCT 24.7 (L) 39.0 - 52.0 %   MCV 73.5 (L) 78.0 - 100.0 fL   MCH 22.6 (L) 26.0 - 34.0 pg   MCHC 30.8 30.0 - 36.0 g/dL   RDW 19.2 (H) 11.5 - 15.5 %   Platelets 242 150 - 400 K/uL  Magnesium     Status: None   Collection Time: 07/17/17  5:26 AM  Result Value Ref Range   Magnesium 1.9 1.7 - 2.4 mg/dL  Glucose, capillary     Status: Abnormal   Collection Time: 07/17/17  8:11 AM  Result Value Ref Range   Glucose-Capillary 159 (H)  65 - 99 mg/dL  Glucose, capillary     Status: Abnormal   Collection Time: 07/17/17 11:48 AM  Result Value Ref Range   Glucose-Capillary 142 (H) 65 - 99 mg/dL  Glucose, capillary     Status: Abnormal   Collection Time: 07/17/17  4:58 PM  Result Value Ref Range   Glucose-Capillary 247 (H) 65 - 99 mg/dL  Glucose, capillary     Status: Abnormal   Collection Time: 07/17/17 10:09 PM  Result Value Ref Range   Glucose-Capillary 211 (H) 65 - 99 mg/dL  Basic metabolic panel     Status: Abnormal   Collection Time: 07/18/17  5:38 AM  Result Value Ref Range   Sodium 139 135 - 145 mmol/L   Potassium 3.6 3.5 - 5.1 mmol/L   Chloride 101 101 - 111 mmol/L   CO2 30 22 - 32 mmol/L   Glucose, Bld 153 (H) 65 - 99 mg/dL   BUN 24 (H) 6 - 20 mg/dL   Creatinine, Ser 0.81 0.61 - 1.24 mg/dL   Calcium 9.0 8.9 - 10.3 mg/dL   GFR calc non Af Amer >60 >60 mL/min   GFR calc Af Amer >60 >60 mL/min    Comment: (NOTE) The eGFR has been  calculated using the CKD EPI equation. This calculation has not been validated in all clinical situations. eGFR's persistently <60 mL/min signify possible Chronic Kidney Disease.    Anion gap 8 5 - 15  CBC     Status: Abnormal   Collection Time: 07/18/17  5:38 AM  Result Value Ref Range   WBC 9.2 4.0 - 10.5 K/uL   RBC 3.74 (L) 4.22 - 5.81 MIL/uL   Hemoglobin 8.3 (L) 13.0 - 17.0 g/dL   HCT 27.5 (L) 39.0 - 52.0 %   MCV 73.5 (L) 78.0 - 100.0 fL   MCH 22.2 (L) 26.0 - 34.0 pg   MCHC 30.2 30.0 - 36.0 g/dL   RDW 18.8 (H) 11.5 - 15.5 %   Platelets 245 150 - 400 K/uL  Glucose, capillary     Status: Abnormal   Collection Time: 07/18/17  8:01 AM  Result Value Ref Range   Glucose-Capillary 136 (H) 65 - 99 mg/dL  Glucose, capillary     Status: Abnormal   Collection Time: 07/18/17 11:38 AM  Result Value Ref Range   Glucose-Capillary 243 (H) 65 - 99 mg/dL  Glucose, capillary     Status: Abnormal   Collection Time: 07/18/17  5:57 PM  Result Value Ref Range   Glucose-Capillary 228 (H) 65 - 99 mg/dL  Glucose, capillary     Status: None   Collection Time: 07/18/17  9:17 PM  Result Value Ref Range   Glucose-Capillary 95 65 - 99 mg/dL  Basic metabolic panel     Status: Abnormal   Collection Time: 07/19/17  5:28 AM  Result Value Ref Range   Sodium 139 135 - 145 mmol/L   Potassium 3.2 (L) 3.5 - 5.1 mmol/L   Chloride 105 101 - 111 mmol/L   CO2 27 22 - 32 mmol/L   Glucose, Bld 114 (H) 65 - 99 mg/dL   BUN 24 (H) 6 - 20 mg/dL   Creatinine, Ser 0.84 0.61 - 1.24 mg/dL   Calcium 8.7 (L) 8.9 - 10.3 mg/dL   GFR calc non Af Amer >60 >60 mL/min   GFR calc Af Amer >60 >60 mL/min    Comment: (NOTE) The eGFR has been calculated using the CKD EPI equation. This calculation has  not been validated in all clinical situations. eGFR's persistently <60 mL/min signify possible Chronic Kidney Disease.    Anion gap 7 5 - 15  CBC     Status: Abnormal   Collection Time: 07/19/17  5:28 AM  Result Value Ref  Range   WBC 8.2 4.0 - 10.5 K/uL   RBC 3.78 (L) 4.22 - 5.81 MIL/uL   Hemoglobin 8.4 (L) 13.0 - 17.0 g/dL   HCT 27.8 (L) 39.0 - 52.0 %   MCV 73.5 (L) 78.0 - 100.0 fL   MCH 22.2 (L) 26.0 - 34.0 pg   MCHC 30.2 30.0 - 36.0 g/dL   RDW 18.3 (H) 11.5 - 15.5 %   Platelets 276 150 - 400 K/uL  Glucose, capillary     Status: Abnormal   Collection Time: 07/19/17  7:28 AM  Result Value Ref Range   Glucose-Capillary 117 (H) 65 - 99 mg/dL  Gastrointestinal Panel by PCR , Stool     Status: None   Collection Time: 07/19/17  9:30 AM  Result Value Ref Range   Campylobacter species NOT DETECTED NOT DETECTED   Plesimonas shigelloides NOT DETECTED NOT DETECTED   Salmonella species NOT DETECTED NOT DETECTED   Yersinia enterocolitica NOT DETECTED NOT DETECTED   Vibrio species NOT DETECTED NOT DETECTED   Vibrio cholerae NOT DETECTED NOT DETECTED   Enteroaggregative E coli (EAEC) NOT DETECTED NOT DETECTED   Enteropathogenic E coli (EPEC) NOT DETECTED NOT DETECTED   Enterotoxigenic E coli (ETEC) NOT DETECTED NOT DETECTED   Shiga like toxin producing E coli (STEC) NOT DETECTED NOT DETECTED   Shigella/Enteroinvasive E coli (EIEC) NOT DETECTED NOT DETECTED   Cryptosporidium NOT DETECTED NOT DETECTED   Cyclospora cayetanensis NOT DETECTED NOT DETECTED   Entamoeba histolytica NOT DETECTED NOT DETECTED   Giardia lamblia NOT DETECTED NOT DETECTED   Adenovirus F40/41 NOT DETECTED NOT DETECTED   Astrovirus NOT DETECTED NOT DETECTED   Norovirus GI/GII NOT DETECTED NOT DETECTED   Rotavirus A NOT DETECTED NOT DETECTED   Sapovirus (I, II, IV, and V) NOT DETECTED NOT DETECTED    Comment: Performed at [facility], [address] [place], [place] [address]  C difficile quick scan w PCR reflex     Status: Abnormal   Collection Time: 07/19/17  9:30 AM  Result Value Ref Range   C Diff antigen POSITIVE (A) NEGATIVE   C Diff toxin POSITIVE (A) NEGATIVE    Comment: CRITICAL RESULT CALLED TO, READ BACK BY AND  VERIFIED WITH: DARK,T @ 1158 ON [id number] BY MCCOY,N CORRECTED RESULTS CALLED TO: CORRECTED ON 01/31 AT 1157: PREVIOUSLY REPORTED AS NEGATIVE    C Diff interpretation Toxin producing C. difficile detected.     Comment: CORRECTED ON 01/31 AT 1157: PREVIOUSLY REPORTED AS Results are indeterminate. See PCR results.  Glucose, capillary     Status: Abnormal   Collection Time: 07/19/17 11:44 AM  Result Value Ref Range   Glucose-Capillary 161 (H) 65 - 99 mg/dL  Basic metabolic panel     Status: Abnormal   Collection Time: 08/15/17  3:12 PM  Result Value Ref Range   Glucose 103 (H) 65 - 99 mg/dL   BUN 11 8 - 27 mg/dL   Creatinine, Ser 0.81 0.76 - 1.27 mg/dL   GFR calc non Af Amer 88 >59 mL/min/1.73   GFR calc Af Amer 102 >59 mL/min/1.73   BUN/Creatinine Ratio 14 10 - 24   Sodium 143 134 - 144 mmol/L   Potassium 5.1 3.5 -  5.2 mmol/L   Chloride 104 96 - 106 mmol/L   CO2 25 20 - 29 mmol/L   Calcium 9.2 8.6 - 10.2 mg/dL  CBC     Status: Abnormal   Collection Time: 08/15/17  3:12 PM  Result Value Ref Range   WBC 8.0 3.4 - 10.8 x10E3/uL   RBC 3.88 (L) 4.14 - 5.80 x10E6/uL   Hemoglobin 8.2 (L) 13.0 - 17.7 g/dL   Hematocrit 27.9 (L) 37.5 - 51.0 %   MCV 72 (L) 79 - 97 fL   MCH 21.1 (L) 26.6 - 33.0 pg   MCHC 29.4 (L) 31.5 - 35.7 g/dL   RDW 17.2 (H) 12.3 - 15.4 %   Platelets 300 150 - 379 x10E3/uL  CBC     Status: Abnormal   Collection Time: 08/22/17  2:17 PM  Result Value Ref Range   WBC 6.9 3.4 - 10.8 x10E3/uL   RBC 3.70 (L) 4.14 - 5.80 x10E6/uL   Hemoglobin 8.1 (L) 13.0 - 17.7 g/dL   Hematocrit 26.6 (L) 37.5 - 51.0 %   MCV 72 (L) 79 - 97 fL   MCH 21.9 (L) 26.6 - 33.0 pg   MCHC 30.5 (L) 31.5 - 35.7 g/dL   RDW 16.6 (H) 12.3 - 15.4 %   Platelets 261 150 - 379 x10E3/uL  CBC     Status: Abnormal   Collection Time: 08/26/17  7:48 PM  Result Value Ref Range   WBC 6.4 4.0 - 10.5 K/uL   RBC 3.58 (L) 4.22 - 5.81 MIL/uL   Hemoglobin 7.6 (L) 13.0 - 17.0 g/dL   HCT 26.9 (L) 39.0 - 52.0 %    MCV 75.1 (L) 78.0 - 100.0 fL   MCH 21.2 (L) 26.0 - 34.0 pg   MCHC 28.3 (L) 30.0 - 36.0 g/dL   RDW 16.3 (H) 11.5 - 15.5 %   Platelets 246 150 - 400 K/uL    Comment: Performed at [facility], [address], [place], [place] [address]    RADIOGRAPHIC STUDIES: I have independently reviewed his CT scan of the chest, abdomen and pelvis, which did not show any source of anemia.  ASSESSMENT & PLAN:  1.  Microcytic anemia: I have reviewed his labs for the last several years.  He has mild anemia since January 2018.  It has gotten worse with hemoglobin ranging between 7 and 8 from November 2018.  His GI workup including EGD and colonoscopy were nonconclusive.  CT scans also did not reveal any source of anemia.  His MCV being low indicates iron deficiency anemia.  We will check his ferritin, iron panel to confirm this.  We will also check O27, folic acid levels, TSH to rule out combination anemia.  I will send a serum protein electrophoresis to evaluate for possible plasma cell disorder.  We will check his stool for occult blood, to evaluate for small bowel bleeding.  I have asked him to start taking iron tablet daily.  His baseline bowel movements are loose.  He will slowly increase iron tablet to twice daily after few days.  We will see him back in 7-10 days to discuss the results.  If he has any difficulty tolerating oral iron, or poor response to therapy, we will consider parenteral iron.  Questions were encouraged and answered to satisfaction.         Derek Jack, MD 09/05/17 1:53 PM

## 2017-09-05 NOTE — Patient Instructions (Signed)
[facility] at [facility] Discharge Instructions  You were seen today by Dr. Katragadda    Thank you for choosing [facility] at [facility] to provide your oncology and hematology care.  To afford each patient quality time with our provider, please arrive at least 15 minutes before your scheduled appointment time.   If you have a lab appointment with the Cancer Center please come in thru the  Main Entrance and check in at the main information desk  You need to re-schedule your appointment should you arrive 10 or more minutes late.  We strive to give you quality time with our providers, and arriving late affects you and other patients whose appointments are after yours.  Also, if you no show three or more times for appointments you may be dismissed from the clinic at the providers discretion.     Again, thank you for choosing [facility].  Our hope is that these requests will decrease the amount of time that you wait before being seen by our physicians.       _____________________________________________________________  Should you have questions after your visit to [facility], please contact our office at [phone] between the hours of 8:30 a.m. and 4:30 p.m.  Voicemails left after 4:30 p.m. will not be returned until the following business day.  For prescription refill requests, have your pharmacy contact our office.       Resources For Cancer Patients and their Caregivers ? American Cancer Society: Can assist with transportation, wigs, general needs, runs Look Good Feel Better.        1-888-227-6333 ? Cancer Care: Provides financial assistance, online support groups, medication/co-pay assistance.  1-800-813-HOPE (4673) ? [facility] Assists [place] cancer patients and their families through emotional , educational and financial support.  336-427-4357 ? [place] DSS Where to  apply for food stamps, Medicaid and utility assistance. 336-342-1394 ? RCATS: Transportation to medical appointments. 336-347-2287 ? Social Security Administration: May apply for disability if have a Stage IV cancer. 336-342-7796 1-800-772-1213 ? [place] Aging, Disability and Transit Services: Assists with nutrition, care and transit needs. 336-349-2343  Cancer Center Support Programs:   > Cancer Support Group  2nd Tuesday of the month 1pm-2pm, Journey Room   > Creative Journey  3rd Tuesday of the month 1130am-1pm, Journey Room     Steps to Quit Smoking  Smoking tobacco can be bad for your health. It can also affect almost every organ in your body. Smoking puts you and people around you at risk for many serious long-lasting (chronic) diseases. Quitting smoking is hard, but it is one of the best things that you can do for your health. It is never too late to quit. What are the benefits of quitting smoking? When you quit smoking, you lower your risk for getting serious diseases and conditions. They can include:  Lung cancer or lung disease.  Heart disease.  Stroke.  Heart attack.  Not being able to have children (infertility).  Weak bones (osteoporosis) and broken bones (fractures).  If you have coughing, wheezing, and shortness of breath, those symptoms may get better when you quit. You may also get sick less often. If you are pregnant, quitting smoking can help to lower your chances of having a baby of low birth weight. What can I do to help me quit smoking? Talk with your doctor about what can help you quit smoking. Some things   you can do (strategies) include:  Quitting smoking totally, instead of slowly cutting back how much you smoke over a period of time.  Going to in-person counseling. You are more likely to quit if you go to many counseling sessions.  Using resources and support systems, such as: ? Online chats with a counselor. ? Phone quitlines. ? Printed  self-help materials. ? Support groups or group counseling. ? Text messaging programs. ? Mobile phone apps or applications.  Taking medicines. Some of these medicines may have nicotine in them. If you are pregnant or breastfeeding, do not take any medicines to quit smoking unless your doctor says it is okay. Talk with your doctor about counseling or other things that can help you.  Talk with your doctor about using more than one strategy at the same time, such as taking medicines while you are also going to in-person counseling. This can help make quitting easier. What things can I do to make it easier to quit? Quitting smoking might feel very hard at first, but there is a lot that you can do to make it easier. Take these steps:  Talk to your family and friends. Ask them to support and encourage you.  Call phone quitlines, reach out to support groups, or work with a counselor.  Ask people who smoke to not smoke around you.  Avoid places that make you want (trigger) to smoke, such as: ? Bars. ? Parties. ? Smoke-break areas at work.  Spend time with people who do not smoke.  Lower the stress in your life. Stress can make you want to smoke. Try these things to help your stress: ? Getting regular exercise. ? Deep-breathing exercises. ? Yoga. ? Meditating. ? Doing a body scan. To do this, close your eyes, focus on one area of your body at a time from head to toe, and notice which parts of your body are tense. Try to relax the muscles in those areas.  Download or buy apps on your mobile phone or tablet that can help you stick to your quit plan. There are many free apps, such as QuitGuide from the CDC (Centers for Disease Control and Prevention). You can find more support from smokefree.gov and other websites.  This information is not intended to replace advice given to you by your health care provider. Make sure you discuss any questions you have with your health care provider. Document  Released: 04/01/2009 Document Revised: 02/01/2016 Document Reviewed: 10/20/2014 Elsevier Interactive Patient Education  2018 Elsevier Inc.     

## 2017-09-06 LAB — PROTEIN ELECTROPHORESIS, SERUM
A/G Ratio: 1.3 (ref 0.7–1.7)
Albumin ELP: 3.9 g/dL (ref 2.9–4.4)
Alpha-1-Globulin: 0.3 g/dL (ref 0.0–0.4)
Alpha-2-Globulin: 1.2 g/dL — ABNORMAL HIGH (ref 0.4–1.0)
Beta Globulin: 1.2 g/dL (ref 0.7–1.3)
GAMMA GLOBULIN: 0.3 g/dL — AB (ref 0.4–1.8)
GLOBULIN, TOTAL: 3 g/dL (ref 2.2–3.9)
TOTAL PROTEIN ELP: 6.9 g/dL (ref 6.0–8.5)

## 2017-09-06 LAB — IRON AND TIBC
IRON: 8 ug/dL — AB (ref 45–182)
Saturation Ratios: 2 % — ABNORMAL LOW (ref 17.9–39.5)
TIBC: 504 ug/dL — AB (ref 250–450)
UIBC: 496 ug/dL

## 2017-09-06 LAB — HAPTOGLOBIN: HAPTOGLOBIN: 289 mg/dL — AB (ref 34–200)

## 2017-09-06 LAB — FERRITIN: Ferritin: 2 ng/mL — ABNORMAL LOW (ref 24–336)

## 2017-09-06 LAB — VITAMIN B12: Vitamin B-12: 728 pg/mL (ref 180–914)

## 2017-09-06 LAB — ERYTHROPOIETIN: Erythropoietin: 116.4 m[IU]/mL — ABNORMAL HIGH (ref 2.6–18.5)

## 2017-09-14 ENCOUNTER — Ambulatory Visit (HOSPITAL_COMMUNITY): Payer: Medicare Other | Admitting: Hematology

## 2017-09-19 ENCOUNTER — Other Ambulatory Visit: Payer: Self-pay

## 2017-09-19 DIAGNOSIS — I70223 Atherosclerosis of native arteries of extremities with rest pain, bilateral legs: Secondary | ICD-10-CM

## 2017-09-19 DIAGNOSIS — I70213 Atherosclerosis of native arteries of extremities with intermittent claudication, bilateral legs: Secondary | ICD-10-CM

## 2017-09-21 ENCOUNTER — Ambulatory Visit (HOSPITAL_COMMUNITY): Payer: Medicare Other | Admitting: Hematology

## 2017-09-27 ENCOUNTER — Ambulatory Visit (HOSPITAL_COMMUNITY): Payer: Medicare Other | Admitting: Hematology

## 2017-10-03 ENCOUNTER — Other Ambulatory Visit: Payer: Self-pay

## 2017-10-03 ENCOUNTER — Emergency Department (HOSPITAL_COMMUNITY): Payer: Medicare Other

## 2017-10-03 ENCOUNTER — Encounter (HOSPITAL_COMMUNITY): Payer: Self-pay | Admitting: *Deleted

## 2017-10-03 ENCOUNTER — Emergency Department (HOSPITAL_COMMUNITY)
Admission: EM | Admit: 2017-10-03 | Discharge: 2017-10-04 | Disposition: A | Discharge status: Home / Self Care | Payer: Medicare Other | Attending: Emergency Medicine | Admitting: Emergency Medicine

## 2017-10-03 DIAGNOSIS — Z5321 Procedure and treatment not carried out due to patient leaving prior to being seen by health care provider: Secondary | ICD-10-CM | POA: Diagnosis not present

## 2017-10-03 DIAGNOSIS — M545 Low back pain: Secondary | ICD-10-CM | POA: Insufficient documentation

## 2017-10-03 DIAGNOSIS — R079 Chest pain, unspecified: Secondary | ICD-10-CM | POA: Insufficient documentation

## 2017-10-03 LAB — CBC
HCT: 33.7 % — ABNORMAL LOW (ref 39.0–52.0)
Hemoglobin: 9.8 g/dL — ABNORMAL LOW (ref 13.0–17.0)
MCH: 23 pg — AB (ref 26.0–34.0)
MCHC: 29.1 g/dL — AB (ref 30.0–36.0)
MCV: 79.1 fL (ref 78.0–100.0)
PLATELETS: 290 10*3/uL (ref 150–400)
RBC: 4.26 MIL/uL (ref 4.22–5.81)
RDW: 22.1 % — ABNORMAL HIGH (ref 11.5–15.5)
WBC: 9 10*3/uL (ref 4.0–10.5)

## 2017-10-03 LAB — BASIC METABOLIC PANEL
Anion gap: 12 (ref 5–15)
BUN: 13 mg/dL (ref 6–20)
CALCIUM: 8.9 mg/dL (ref 8.9–10.3)
CO2: 23 mmol/L (ref 22–32)
CREATININE: 0.91 mg/dL (ref 0.61–1.24)
Chloride: 104 mmol/L (ref 101–111)
GFR calc non Af Amer: >60 mL/min (ref >60)
GLUCOSE: 102 mg/dL — AB (ref 65–99)
Potassium: 3.8 mmol/L (ref 3.5–5.1)
Sodium: 139 mmol/L (ref 135–145)

## 2017-10-03 LAB — TROPONIN I

## 2017-10-03 NOTE — ED Triage Notes (Signed)
The pt arrived by gems from home he is c/o lower back pain that extends up into his chest.  He is on home 02 at home hx of copd and af

## 2017-10-04 NOTE — ED Notes (Signed)
Pt stated that he could not wait any longer.  Pt decided to go home. IV removed

## 2017-10-10 ENCOUNTER — Other Ambulatory Visit: Payer: Self-pay

## 2017-10-10 ENCOUNTER — Inpatient Hospital Stay (HOSPITAL_COMMUNITY): Payer: Medicare Other | Attending: Hematology | Admitting: Hematology

## 2017-10-10 ENCOUNTER — Encounter (HOSPITAL_COMMUNITY): Payer: Self-pay | Admitting: Hematology

## 2017-10-10 VITALS — BP 142/66 | HR 78 | Temp 97.6°F | Resp 18 | Wt 160.3 lb

## 2017-10-10 DIAGNOSIS — D509 Iron deficiency anemia, unspecified: Secondary | ICD-10-CM | POA: Insufficient documentation

## 2017-10-10 NOTE — Assessment & Plan Note (Signed)
1.  Severe iron deficiency anemia: -He started taking iron pill once daily around 20 March.  His energy levels remain the same.  He came to the ER with low back pain radiating to the chest.  CBC done at that time showed hemoglobin improved to 9.8 from 7.9 prior to start of iron pills.  He does not report any diarrhea or constipation.  His MCV has also improved from 75-79.  We had a discussion whether to give him 2 infusions of parenteral iron to increase his iron stores rapidly.  He would want to try increasing the iron twice daily.  We will asked him to come back in 6 weeks and repeat his CBC, ferritin, iron panel prior to next visit.  We will also check his stool cards to rule out bleeding from small bowel.  His SPEP from last visit was normal.  He was advised to take a stool softener once he increases the iron tablets to twice daily.

## 2017-10-10 NOTE — Patient Instructions (Addendum)
[facility] at [facility] Discharge Instructions  Today you saw Dr. Kirtland BouchardK.  Increase your  Oral Iron to twice a day. Return to clinic in 6 weeks with labs.    Thank you for choosing [facility] at [facility] to provide your oncology and hematology care.  To afford each patient quality time with our provider, please arrive at least 15 minutes before your scheduled appointment time.   If you have a lab appointment with the Cancer Center please come in thru the  Main Entrance and check in at the main information desk  You need to re-schedule your appointment should you arrive 10 or more minutes late.  We strive to give you quality time with our providers, and arriving late affects you and other patients whose appointments are after yours.  Also, if you no show three or more times for appointments you may be dismissed from the clinic at the providers discretion.     Again, thank you for choosing [facility].  Our hope is that these requests will decrease the amount of time that you wait before being seen by our physicians.       _____________________________________________________________  Should you have questions after your visit to [facility], please contact our office at [phone] between the hours of 8:30 a.m. and 4:30 p.m.  Voicemails left after 4:30 p.m. will not be returned until the following business day.  For prescription refill requests, have your pharmacy contact our office.       Resources For Cancer Patients and their Caregivers ? American Cancer Society: Can assist with transportation, wigs, general needs, runs Look Good Feel Better.        803-851-33111-(978)265-4984 ? Cancer Care: Provides financial assistance, online support groups, medication/co-pay assistance.  1-800-813-HOPE 574-154-8108(4673) ? [facility] Assists [place] cancer patients and their families through emotional , educational and  financial support.  (450)447-5650(831)512-1493 ? [place] DSS Where to apply for food stamps, Medicaid and utility assistance. 249-248-46838310015559 ? RCATS: Transportation to medical appointments. 770-557-7254415 549 3720 ? Social Security Administration: May apply for disability if have a Stage IV cancer. [phone] ? [place] [facility], Dis[facility] and [place] Services: Assists with nutrition, care and transit needs. [phone]  Cancer Center Support Programs:   > Cancer Support Group  2nd Tuesday of the month 1pm-2pm, Journey Room   > Creative Journey  3rd Tuesday of the month 1130am-1pm, Journey Room

## 2017-10-10 NOTE — Addendum Note (Signed)
Addended by: Hubbard HartshornAWSON, GRETCHEN W on: 10/10/2017 01:59 PM   Modules accepted: Orders

## 2017-10-10 NOTE — Progress Notes (Signed)
FOLLOW-UP NOTE  Patient Care Team: Chesley Noon, MD as PCP - General (Family Medicine) Belva Crome, MD as PCP - Cardiology (Cardiology)  CHIEF COMPLAINT:  Microcytic anemia  HISTORY OF PRESENTING ILLNESS:  John Marsh 72 y.o. male is seen today for follow-up for anemia.    Here today with family.   At last visit , he was started on oral iron supplementation. Hgb on 09/05/17 was 7.9 g/dL.  Currently taking 1 oral iron tab per day. Denies constipation.  No improvement in fatigue.  "I feel worn out all of the time."  Patient feels like he may have started feeling "a little better" since he started taking the iron pill.  Wearing supplemental O2 via nasal cannula today.  He has trouble ambulating d/t PVD/lower extremity pain.   Chart reviewed. He went to [facility] ED on 10/03/17 for chest pain; he left before being evaluated by MD.  Per patient, he was having lower back radiation to chest discomfort and trouble breathing. This pain improved per patient. "I waited for 6-7 hours, the pain got better, so I checked myself out."  Labs at that visit showed Hgb 9.8 g/dL.   He is leery of trying IV iron infusions d/t concerns for infusion reaction. He would like to try to take more oral iron instead if possible.     MEDICAL HISTORY:  Past Medical History:  Diagnosis Date  . Anemia   . Atrial fibrillation ([place])    Chadsvasc2=3  . COPD (chronic obstructive pulmonary disease) ([place])   . Coronary heart disease   . Emphysema of lung ([place])   . Hyperlipidemia   . Hypertension   . Iron deficiency anemia   . MI (myocardial infarction) ([place])   . Multiple lung nodules on CT   . Nocturnal hypoxia   . Peripheral arterial disease ([place])   . Shortness of breath dyspnea     SURGICAL HISTORY: Past Surgical History:  Procedure Laterality Date  . CHOLECYSTECTOMY    . COLONOSCOPY WITH PROPOFOL N/A 07/19/2017   Procedure: COLONOSCOPY WITH PROPOFOL;  Surgeon: Milus Banister, MD;  Location: [facility]  ENDOSCOPY;  Service: Endoscopy;  Laterality: N/A;  . ESOPHAGOGASTRODUODENOSCOPY (EGD) WITH PROPOFOL N/A 07/19/2017   Procedure: ESOPHAGOGASTRODUODENOSCOPY (EGD) WITH PROPOFOL;  Surgeon: Milus Banister, MD;  Location: [facility] ENDOSCOPY;  Service: Endoscopy;  Laterality: N/A;  . NASAL SEPTUM SURGERY    . PERIPHERAL VASCULAR CATHETERIZATION N/A 07/27/2015   Procedure: Abdominal Aortogram;  Surgeon: Angelia Mould, MD;  Location: [facility];  Service: Cardiovascular;  Laterality: N/A;  . PERIPHERAL VASCULAR CATHETERIZATION Bilateral 07/27/2015   Procedure: Lower Extremity Angiography;  Surgeon: Angelia Mould, MD;  Location: [facility];  Service: Cardiovascular;  Laterality: Bilateral;  . stent placed      SOCIAL HISTORY: Social History   Socioeconomic History  . Marital status: Married    Spouse name: Not on file  . Number of children: Not on file  . Years of education: Not on file  . Highest education level: Not on file  Occupational History  . Occupation: retired  Scientific laboratory technician  . Financial resource strain: Not on file  . Food insecurity:    Worry: Not on file    Inability: Not on file  . Transportation needs:    Medical: Not on file    Non-medical: Not on file  Tobacco Use  . Smoking status: Current Every Day Smoker    Packs/day: 0.20  Years: 54.00    Pack years: 10.80    Types: Cigarettes    Start date: 08/01/1959  . Smokeless tobacco: Never Used  . Tobacco comment: smokes 4-5 cigerettes/day  Substance and Sexual Activity  . Alcohol use: No    Alcohol/week: 0.0 oz  . Drug use: No  . Sexual activity: Never  Lifestyle  . Physical activity:    Days per week: Not on file    Minutes per session: Not on file  . Stress: Not on file  Relationships  . Social connections:    Talks on phone: Not on file    Gets together: Not on file    Attends religious service: Not on file    Active member of club or organization: Not on file    Attends meetings of  clubs or organizations: Not on file    Relationship status: Not on file  . Intimate partner violence:    Fear of current or ex partner: Not on file    Emotionally abused: Not on file    Physically abused: Not on file    Forced sexual activity: Not on file  Other Topics Concern  . Not on file  Social History Narrative   Originally from [place]. Previously has lived in [place]. He moved to [facility] in 1964. He serve in [place]. He served in Psychologist, prison and probation services. He has been to [place], [place], [place], several countries in [place], countries in [place], [place], & multiple [place]. 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 He has a dog, 7 cats, & 2 horses. Remote exposure to a parrot for a few years in a different home. No mold exposure. No hot tub exposure. 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     FAMILY HISTORY: Family History  Problem Relation Age of Onset  . Heart disease Mother        died at 1  . Heart disease Father   . Heart disease Sister        from rheumatic fever as a child  . Heart disease Paternal Grandfather     ALLERGIES:  has No Known Allergies.  MEDICATIONS:  Current Outpatient Medications  Medication Sig Dispense Refill  . acetaminophen (TYLENOL) 325 MG tablet Take 650 mg by mouth every 6 (six) hours as needed for mild pain.    Marland Kitchen albuterol (PROVENTIL HFA;VENTOLIN HFA) 108 (90 Base) MCG/ACT inhaler Inhale 2 puffs into the lungs every 6 (six) hours as needed for wheezing or shortness of breath. 1 Inhaler 6  . apixaban (ELIQUIS) 5 MG TABS tablet Take 1 tablet (5 mg total) 2 (two) times daily by mouth. 60 tablet 0  . ATROVENT HFA 17 MCG/ACT inhaler Take 2 puffs by mouth 4 (four) times daily.  5  . benzonatate (TESSALON) 100 MG capsule Take 1 capsule (100 mg total) 3 (three) times daily by mouth. 20 capsule 0  . budesonide (PULMICORT) 0.5 MG/2ML nebulizer  solution Take 2 mLs (0.5 mg total) by nebulization 2 (two) times daily. 60 mL 3  . diltiazem (CARDIZEM CD) 360 MG 24 hr capsule Take 1 capsule (360 mg total) daily by mouth. 60 capsule 0  . feeding supplement, ENSURE ENLIVE, (ENSURE ENLIVE) LIQD Take 237 mLs by mouth 3 (three) times daily between meals. 30 Bottle 0  . fluticasone (FLONASE) 50 MCG/ACT nasal spray Place 2 sprays into both nostrils daily  as needed for allergies or rhinitis. 16 g 0  . formoterol (PERFOROMIST) 20 MCG/2ML nebulizer solution Take 2 mLs (20 mcg total) by nebulization 2 (two) times daily. DX code: J43.8 120 mL 3  . gabapentin (NEURONTIN) 100 MG capsule Take 1 capsule (100 mg total) 2 (two) times daily by mouth. 60 capsule 0  . guaiFENesin (MUCINEX) 600 MG 12 hr tablet Take 1 tablet (600 mg total) by mouth 2 (two) times daily. 30 tablet 0  . HYDROcodone-acetaminophen (NORCO/VICODIN) 5-325 MG tablet Take by mouth.    . menthol-cetylpyridinium (CEPACOL) 3 MG lozenge Take 1 lozenge (3 mg total) as needed by mouth for sore throat. 100 tablet 12  . montelukast (SINGULAIR) 5 MG chewable tablet CHEW 1 TABLET BY MOUTH AT BEDTIME 30 tablet 3  . nitroGLYCERIN (NITROSTAT) 0.4 MG SL tablet Place 0.4 mg under the tongue every 5 (five) minutes as needed for chest pain. x3 doses as needed for chest pain    . pantoprazole (PROTONIX) 40 MG tablet Take 1 tablet (40 mg total) by mouth daily. 30 tablet 0  . pramipexole (MIRAPEX) 0.125 MG tablet Take 1 tablet by mouth at bedtime.    . predniSONE (DELTASONE) 10 MG tablet Take '60mg'$  daily for 3 days,Take '40mg'$  daily for 3days,Take '30mg'$  daily for 3days,Take '20mg'$  daily for 3days,Take '10mg'$  daily for 3days, then stop. 40 tablet 0  . rosuvastatin (CRESTOR) 20 MG tablet Take 20 mg by mouth at bedtime.    . sodium chloride (OCEAN) 0.65 % SOLN nasal spray Place 1 spray as needed into both nostrils for congestion (nose irritation). 2 Bottle 0  . Spacer/Aero-Holding Chambers (AEROCHAMBER Z-STAT PLUS) inhaler  Use as instructed 1 each 0  . tamsulosin (FLOMAX) 0.4 MG CAPS capsule Take 0.4 mg by mouth.    . triamcinolone (NASACORT ALLERGY 24HR) 55 MCG/ACT AERO nasal inhaler Place 2 sprays daily as needed into the nose (allergies).      No current facility-administered medications for this visit.     REVIEW OF SYSTEMS:   Constitutional: Denies fevers, chills or abnormal night sweats Eyes: Denies blurriness of vision, double vision or watery eyes Ears, nose, mouth, throat, and face: Denies mucositis or sore throat Respiratory: Denies cough,  wheezes he has dyspnea on exertion. Cardiovascular: Denies palpitation, chest discomfort or lower extremity swelling.  He has lower extremity pains on walking. Gastrointestinal:  Denies nausea, heartburn or change in bowel habits Skin: Denies abnormal skin rashes Lymphatics: Denies new lymphadenopathy or easy bruising Neurological:Denies numbness, tingling or new weaknesses Behavioral/Psych: Mood is stable, no new changes  All other systems were reviewed with the patient and are negative.  PHYSICAL EXAMINATION: ECOG PERFORMANCE STATUS: 2 - Symptomatic, <50% confined to bed  Vitals:   10/10/17 1016  BP: (!) 142/66  Pulse: 78  Resp: 18  Temp: 97.6 F (36.4 C)  SpO2: 100%   Filed Weights   10/10/17 1016  Weight: 160 lb 4.8 oz (72.7 kg)    GENERAL:alert, no distress and comfortable SKIN: skin color, texture, turgor are normal, no rashes or significant lesions   LABORATORY DATA:  I have reviewed the data as listed Recent Results (from the past 2160 hour(s))  CBC with Differential     Status: Abnormal   Collection Time: 07/14/17  6:59 PM  Result Value Ref Range   WBC 9.4 4.0 - 10.5 K/uL   RBC 3.85 (L) 4.22 - 5.81 MIL/uL   Hemoglobin 8.6 (L) 13.0 - 17.0 g/dL   HCT 29.0 (L) 39.0 -  52.0 %   MCV 75.3 (L) 78.0 - 100.0 fL   MCH 22.3 (L) 26.0 - 34.0 pg   MCHC 29.7 (L) 30.0 - 36.0 g/dL   RDW 19.5 (H) 11.5 - 15.5 %   Platelets 265 150 - 400 K/uL    Neutrophils Relative % 88 %   Neutro Abs 8.4 (H) 1.7 - 7.7 K/uL   Lymphocytes Relative 8 %   Lymphs Abs 0.7 0.7 - 4.0 K/uL   Monocytes Relative 3 %   Monocytes Absolute 0.3 0.1 - 1.0 K/uL   Eosinophils Relative 1 %   Eosinophils Absolute 0.1 0.0 - 0.7 K/uL   Basophils Relative 0 %   Basophils Absolute 0.0 0.0 - 0.1 K/uL  Comprehensive metabolic panel     Status: Abnormal   Collection Time: 07/14/17  6:59 PM  Result Value Ref Range   Sodium 141 135 - 145 mmol/L   Potassium 3.7 3.5 - 5.1 mmol/L   Chloride 104 101 - 111 mmol/L   CO2 27 22 - 32 mmol/L   Glucose, Bld 203 (H) 65 - 99 mg/dL   BUN 18 6 - 20 mg/dL   Creatinine, Ser 0.78 0.61 - 1.24 mg/dL   Calcium 8.8 (L) 8.9 - 10.3 mg/dL   Total Protein 6.9 6.5 - 8.1 g/dL   Albumin 4.0 3.5 - 5.0 g/dL   AST 20 15 - 41 U/L   ALT 15 (L) 17 - 63 U/L   Alkaline Phosphatase 61 38 - 126 U/L   Total Bilirubin 0.2 (L) 0.3 - 1.2 mg/dL   GFR calc non Af Amer >60 >60 mL/min   GFR calc Af Amer >60 >60 mL/min    Comment: (NOTE) The eGFR has been calculated using the CKD EPI equation. This calculation has not been validated in all clinical situations. eGFR's persistently <60 mL/min signify possible Chronic Kidney Disease.    Anion gap 10 5 - 15  Lipase, blood     Status: None   Collection Time: 07/14/17  6:59 PM  Result Value Ref Range   Lipase 41 11 - 51 U/L  Protime-INR     Status: None   Collection Time: 07/14/17  6:59 PM  Result Value Ref Range   Prothrombin Time 13.7 11.4 - 15.2 seconds   INR 1.06   D-dimer, quantitative (not at [facility])     Status: None   Collection Time: 07/14/17  6:59 PM  Result Value Ref Range   D-Dimer, Quant 0.30 0.00 - 0.50 ug/mL-FEU    Comment: (NOTE) At the manufacturer cut-off of 0.50 ug/mL FEU, this assay has been documented to exclude PE with a sensitivity and negative predictive value of 97 to 99%.  At this time, this assay has not been approved by the FDA to exclude DVT/VTE. Results should be correlated  with clinical presentation.   I-Stat Troponin, ED (not at [facility])     Status: None   Collection Time: 07/14/17  7:10 PM  Result Value Ref Range   Troponin i, poc 0.00 0.00 - 0.08 ng/mL   Comment 3            Comment: Due to the release kinetics of cTnI, a negative result within the first hours of the onset of symptoms does not rule out myocardial infarction with certainty. If myocardial infarction is still suspected, repeat the test at appropriate intervals.   Glucose, capillary     Status: Abnormal   Collection Time: 07/14/17 10:42 PM  Result Value Ref Range  Glucose-Capillary 265 (H) 65 - 99 mg/dL  Troponin I     Status: None   Collection Time: 07/14/17 10:48 PM  Result Value Ref Range   Troponin I <0.03 <0.03 ng/mL  Glucose, capillary     Status: Abnormal   Collection Time: 07/15/17  1:29 AM  Result Value Ref Range   Glucose-Capillary 326 (H) 65 - 99 mg/dL  Hemoglobin A1c     Status: Abnormal   Collection Time: 07/15/17  5:05 AM  Result Value Ref Range   Hgb A1c MFr Bld 8.2 (H) 4.8 - 5.6 %    Comment: (NOTE) Pre diabetes:          5.7%-6.4% Diabetes:              >6.4% Glycemic control for   <7.0% adults with diabetes    Mean Plasma Glucose 188.64 mg/dL    Comment: Performed at [facility] [address] [place], [place] [address]  CBC     Status: Abnormal   Collection Time: 07/15/17  5:05 AM  Result Value Ref Range   WBC 7.8 4.0 - 10.5 K/uL   RBC 3.50 (L) 4.22 - 5.81 MIL/uL   Hemoglobin 7.9 (L) 13.0 - 17.0 g/dL   HCT 26.1 (L) 39.0 - 52.0 %   MCV 74.6 (L) 78.0 - 100.0 fL   MCH 22.6 (L) 26.0 - 34.0 pg   MCHC 30.3 30.0 - 36.0 g/dL   RDW 19.7 (H) 11.5 - 15.5 %   Platelets 250 150 - 400 K/uL  Comprehensive metabolic panel     Status: Abnormal   Collection Time: 07/15/17  5:05 AM  Result Value Ref Range   Sodium 140 135 - 145 mmol/L   Potassium 3.8 3.5 - 5.1 mmol/L   Chloride 106 101 - 111 mmol/L   CO2 26 22 - 32 mmol/L   Glucose, Bld 243 (H) 65 - 99  mg/dL   BUN 20 6 - 20 mg/dL   Creatinine, Ser 0.75 0.61 - 1.24 mg/dL   Calcium 8.7 (L) 8.9 - 10.3 mg/dL   Total Protein 6.1 (L) 6.5 - 8.1 g/dL   Albumin 3.3 (L) 3.5 - 5.0 g/dL   AST 19 15 - 41 U/L   ALT 13 (L) 17 - 63 U/L   Alkaline Phosphatase 53 38 - 126 U/L   Total Bilirubin <0.1 (L) 0.3 - 1.2 mg/dL   GFR calc non Af Amer >60 >60 mL/min   GFR calc Af Amer >60 >60 mL/min    Comment: (NOTE) The eGFR has been calculated using the CKD EPI equation. This calculation has not been validated in all clinical situations. eGFR's persistently <60 mL/min signify possible Chronic Kidney Disease.    Anion gap 8 5 - 15  Troponin I     Status: None   Collection Time: 07/15/17  5:05 AM  Result Value Ref Range   Troponin I <0.03 <0.03 ng/mL  Lipid panel     Status: None   Collection Time: 07/15/17  5:05 AM  Result Value Ref Range   Cholesterol 119 0 - 200 mg/dL   Triglycerides 39 <150 mg/dL   HDL 52 >40 mg/dL   Total CHOL/HDL Ratio 2.3 RATIO   VLDL 8 0 - 40 mg/dL   LDL Cholesterol 59 0 - 99 mg/dL    Comment:        Total Cholesterol/HDL:CHD Risk Coronary Heart Disease Risk Table  Men   Women  1/2 Average Risk   3.4   3.3  Average Risk       5.0   4.4  2 X Average Risk   9.6   7.1  3 X Average Risk  23.4   11.0        Use the calculated Patient Ratio above and the CHD Risk Table to determine the patient's CHD Risk.        ATP III CLASSIFICATION (LDL):  <100     mg/dL   Optimal  100-129  mg/dL   Near or Above                    Optimal  130-159  mg/dL   Borderline  160-189  mg/dL   High  >190     mg/dL   Very High   Glucose, capillary     Status: Abnormal   Collection Time: 07/15/17  5:18 AM  Result Value Ref Range   Glucose-Capillary 253 (H) 65 - 99 mg/dL  Glucose, capillary     Status: Abnormal   Collection Time: 07/15/17  7:44 AM  Result Value Ref Range   Glucose-Capillary 218 (H) 65 - 99 mg/dL  Troponin I     Status: None   Collection Time: 07/15/17  10:33 AM  Result Value Ref Range   Troponin I <0.03 <0.03 ng/mL  Glucose, capillary     Status: Abnormal   Collection Time: 07/15/17 11:51 AM  Result Value Ref Range   Glucose-Capillary 163 (H) 65 - 99 mg/dL  ECHOCARDIOGRAM COMPLETE     Status: None   Collection Time: 07/15/17  1:10 PM  Result Value Ref Range   Weight 2,582.03 oz   Height 72 in   BP 122/60 mmHg  Glucose, capillary     Status: Abnormal   Collection Time: 07/15/17  5:10 PM  Result Value Ref Range   Glucose-Capillary 218 (H) 65 - 99 mg/dL  Glucose, capillary     Status: Abnormal   Collection Time: 07/15/17 10:46 PM  Result Value Ref Range   Glucose-Capillary 242 (H) 65 - 99 mg/dL   Comment 1 Notify RN   Glucose, capillary     Status: Abnormal   Collection Time: 07/16/17  7:35 AM  Result Value Ref Range   Glucose-Capillary 173 (H) 65 - 99 mg/dL  TSH     Status: None   Collection Time: 07/16/17  8:34 AM  Result Value Ref Range   TSH 0.864 0.350 - 4.500 uIU/mL    Comment: Performed by a 3rd Generation assay with a functional sensitivity of <=0.01 uIU/mL.  T4, free     Status: None   Collection Time: 07/16/17  8:34 AM  Result Value Ref Range   Free T4 0.78 0.61 - 1.12 ng/dL    Comment: (NOTE) Biotin ingestion may interfere with free T4 tests. If the results are inconsistent with the TSH level, previous test results, or the clinical presentation, then consider biotin interference. If needed, order repeat testing after stopping biotin. Performed at [facility], [place] [address], [place], [place] [address]   CBC     Status: Abnormal   Collection Time: 07/16/17  8:34 AM  Result Value Ref Range   WBC 15.2 (H) 4.0 - 10.5 K/uL   RBC 3.54 (L) 4.22 - 5.81 MIL/uL   Hemoglobin 8.0 (L) 13.0 - 17.0 g/dL   HCT 26.3 (L) 39.0 - 52.0 %   MCV 74.3 (L) 78.0 -  100.0 fL   MCH 22.6 (L) 26.0 - 34.0 pg   MCHC 30.4 30.0 - 36.0 g/dL   RDW 19.6 (H) 11.5 - 15.5 %   Platelets 288 150 - 400 K/uL  Brain natriuretic peptide      Status: Abnormal   Collection Time: 07/16/17  8:34 AM  Result Value Ref Range   B Natriuretic Peptide 239.6 (H) 0.0 - 100.0 pg/mL  Glucose, capillary     Status: Abnormal   Collection Time: 07/16/17 11:36 AM  Result Value Ref Range   Glucose-Capillary 203 (H) 65 - 99 mg/dL  Respiratory Panel by PCR     Status: None   Collection Time: 07/16/17 12:29 PM  Result Value Ref Range   Adenovirus NOT DETECTED NOT DETECTED   Coronavirus 229E NOT DETECTED NOT DETECTED   Coronavirus HKU1 NOT DETECTED NOT DETECTED   Coronavirus NL63 NOT DETECTED NOT DETECTED   Coronavirus OC43 NOT DETECTED NOT DETECTED   Metapneumovirus NOT DETECTED NOT DETECTED   Rhinovirus / Enterovirus NOT DETECTED NOT DETECTED   Influenza A NOT DETECTED NOT DETECTED   Influenza B NOT DETECTED NOT DETECTED   Parainfluenza Virus 1 NOT DETECTED NOT DETECTED   Parainfluenza Virus 2 NOT DETECTED NOT DETECTED   Parainfluenza Virus 3 NOT DETECTED NOT DETECTED   Parainfluenza Virus 4 NOT DETECTED NOT DETECTED   Respiratory Syncytial Virus NOT DETECTED NOT DETECTED   Bordetella pertussis NOT DETECTED NOT DETECTED   Chlamydophila pneumoniae NOT DETECTED NOT DETECTED   Mycoplasma pneumoniae NOT DETECTED NOT DETECTED    Comment: Performed at [facility], [place] [address], [place], [place] [address]  Glucose, capillary     Status: Abnormal   Collection Time: 07/16/17  4:08 PM  Result Value Ref Range   Glucose-Capillary 173 (H) 65 - 99 mg/dL  Glucose, capillary     Status: Abnormal   Collection Time: 07/16/17  8:52 PM  Result Value Ref Range   Glucose-Capillary 209 (H) 65 - 99 mg/dL  Basic metabolic panel     Status: Abnormal   Collection Time: 07/17/17  5:26 AM  Result Value Ref Range   Sodium 139 135 - 145 mmol/L   Potassium 3.3 (L) 3.5 - 5.1 mmol/L   Chloride 103 101 - 111 mmol/L   CO2 28 22 - 32 mmol/L   Glucose, Bld 147 (H) 65 - 99 mg/dL   BUN 27 (H) 6 - 20 mg/dL   Creatinine, Ser 0.80 0.61 - 1.24 mg/dL   Calcium  8.7 (L) 8.9 - 10.3 mg/dL   GFR calc non Af Amer >60 >60 mL/min   GFR calc Af Amer >60 >60 mL/min    Comment: (NOTE) The eGFR has been calculated using the CKD EPI equation. This calculation has not been validated in all clinical situations. eGFR's persistently <60 mL/min signify possible Chronic Kidney Disease.    Anion gap 8 5 - 15  CBC     Status: Abnormal   Collection Time: 07/17/17  5:26 AM  Result Value Ref Range   WBC 10.0 4.0 - 10.5 K/uL   RBC 3.36 (L) 4.22 - 5.81 MIL/uL   Hemoglobin 7.6 (L) 13.0 - 17.0 g/dL   HCT 24.7 (L) 39.0 - 52.0 %   MCV 73.5 (L) 78.0 - 100.0 fL   MCH 22.6 (L) 26.0 - 34.0 pg   MCHC 30.8 30.0 - 36.0 g/dL   RDW 19.2 (H) 11.5 - 15.5 %   Platelets 242 150 - 400 K/uL  Magnesium  Status: None   Collection Time: 07/17/17  5:26 AM  Result Value Ref Range   Magnesium 1.9 1.7 - 2.4 mg/dL  Glucose, capillary     Status: Abnormal   Collection Time: 07/17/17  8:11 AM  Result Value Ref Range   Glucose-Capillary 159 (H) 65 - 99 mg/dL  Glucose, capillary     Status: Abnormal   Collection Time: 07/17/17 11:48 AM  Result Value Ref Range   Glucose-Capillary 142 (H) 65 - 99 mg/dL  Glucose, capillary     Status: Abnormal   Collection Time: 07/17/17  4:58 PM  Result Value Ref Range   Glucose-Capillary 247 (H) 65 - 99 mg/dL  Glucose, capillary     Status: Abnormal   Collection Time: 07/17/17 10:09 PM  Result Value Ref Range   Glucose-Capillary 211 (H) 65 - 99 mg/dL  Basic metabolic panel     Status: Abnormal   Collection Time: 07/18/17  5:38 AM  Result Value Ref Range   Sodium 139 135 - 145 mmol/L   Potassium 3.6 3.5 - 5.1 mmol/L   Chloride 101 101 - 111 mmol/L   CO2 30 22 - 32 mmol/L   Glucose, Bld 153 (H) 65 - 99 mg/dL   BUN 24 (H) 6 - 20 mg/dL   Creatinine, Ser 0.81 0.61 - 1.24 mg/dL   Calcium 9.0 8.9 - 10.3 mg/dL   GFR calc non Af Amer >60 >60 mL/min   GFR calc Af Amer >60 >60 mL/min    Comment: (NOTE) The eGFR has been calculated using the CKD EPI  equation. This calculation has not been validated in all clinical situations. eGFR's persistently <60 mL/min signify possible Chronic Kidney Disease.    Anion gap 8 5 - 15  CBC     Status: Abnormal   Collection Time: 07/18/17  5:38 AM  Result Value Ref Range   WBC 9.2 4.0 - 10.5 K/uL   RBC 3.74 (L) 4.22 - 5.81 MIL/uL   Hemoglobin 8.3 (L) 13.0 - 17.0 g/dL   HCT 27.5 (L) 39.0 - 52.0 %   MCV 73.5 (L) 78.0 - 100.0 fL   MCH 22.2 (L) 26.0 - 34.0 pg   MCHC 30.2 30.0 - 36.0 g/dL   RDW 18.8 (H) 11.5 - 15.5 %   Platelets 245 150 - 400 K/uL  Glucose, capillary     Status: Abnormal   Collection Time: 07/18/17  8:01 AM  Result Value Ref Range   Glucose-Capillary 136 (H) 65 - 99 mg/dL  Glucose, capillary     Status: Abnormal   Collection Time: 07/18/17 11:38 AM  Result Value Ref Range   Glucose-Capillary 243 (H) 65 - 99 mg/dL  Glucose, capillary     Status: Abnormal   Collection Time: 07/18/17  5:57 PM  Result Value Ref Range   Glucose-Capillary 228 (H) 65 - 99 mg/dL  Glucose, capillary     Status: None   Collection Time: 07/18/17  9:17 PM  Result Value Ref Range   Glucose-Capillary 95 65 - 99 mg/dL  Basic metabolic panel     Status: Abnormal   Collection Time: 07/19/17  5:28 AM  Result Value Ref Range   Sodium 139 135 - 145 mmol/L   Potassium 3.2 (L) 3.5 - 5.1 mmol/L   Chloride 105 101 - 111 mmol/L   CO2 27 22 - 32 mmol/L   Glucose, Bld 114 (H) 65 - 99 mg/dL   BUN 24 (H) 6 - 20 mg/dL   Creatinine, Ser 0.84  0.61 - 1.24 mg/dL   Calcium 8.7 (L) 8.9 - 10.3 mg/dL   GFR calc non Af Amer >60 >60 mL/min   GFR calc Af Amer >60 >60 mL/min    Comment: (NOTE) The eGFR has been calculated using the CKD EPI equation. This calculation has not been validated in all clinical situations. eGFR's persistently <60 mL/min signify possible Chronic Kidney Disease.    Anion gap 7 5 - 15  CBC     Status: Abnormal   Collection Time: 07/19/17  5:28 AM  Result Value Ref Range   WBC 8.2 4.0 - 10.5 K/uL    RBC 3.78 (L) 4.22 - 5.81 MIL/uL   Hemoglobin 8.4 (L) 13.0 - 17.0 g/dL   HCT 27.8 (L) 39.0 - 52.0 %   MCV 73.5 (L) 78.0 - 100.0 fL   MCH 22.2 (L) 26.0 - 34.0 pg   MCHC 30.2 30.0 - 36.0 g/dL   RDW 18.3 (H) 11.5 - 15.5 %   Platelets 276 150 - 400 K/uL  Glucose, capillary     Status: Abnormal   Collection Time: 07/19/17  7:28 AM  Result Value Ref Range   Glucose-Capillary 117 (H) 65 - 99 mg/dL  Gastrointestinal Panel by PCR , Stool     Status: None   Collection Time: 07/19/17  9:30 AM  Result Value Ref Range   Campylobacter species NOT DETECTED NOT DETECTED   Plesimonas shigelloides NOT DETECTED NOT DETECTED   Salmonella species NOT DETECTED NOT DETECTED   Yersinia enterocolitica NOT DETECTED NOT DETECTED   Vibrio species NOT DETECTED NOT DETECTED   Vibrio cholerae NOT DETECTED NOT DETECTED   Enteroaggregative E coli (EAEC) NOT DETECTED NOT DETECTED   Enteropathogenic E coli (EPEC) NOT DETECTED NOT DETECTED   Enterotoxigenic E coli (ETEC) NOT DETECTED NOT DETECTED   Shiga like toxin producing E coli (STEC) NOT DETECTED NOT DETECTED   Shigella/Enteroinvasive E coli (EIEC) NOT DETECTED NOT DETECTED   Cryptosporidium NOT DETECTED NOT DETECTED   Cyclospora cayetanensis NOT DETECTED NOT DETECTED   Entamoeba histolytica NOT DETECTED NOT DETECTED   Giardia lamblia NOT DETECTED NOT DETECTED   Adenovirus F40/41 NOT DETECTED NOT DETECTED   Astrovirus NOT DETECTED NOT DETECTED   Norovirus GI/GII NOT DETECTED NOT DETECTED   Rotavirus A NOT DETECTED NOT DETECTED   Sapovirus (I, II, IV, and V) NOT DETECTED NOT DETECTED    Comment: Performed at [facility], [address] [place], [place] [address]  C difficile quick scan w PCR reflex     Status: Abnormal   Collection Time: 07/19/17  9:30 AM  Result Value Ref Range   C Diff antigen POSITIVE (A) NEGATIVE   C Diff toxin POSITIVE (A) NEGATIVE    Comment: CRITICAL RESULT CALLED TO, READ BACK BY AND VERIFIED WITH: DARK,T @ 1158 ON  [id number] BY MCCOY,N CORRECTED RESULTS CALLED TO: CORRECTED ON 01/31 AT 1157: PREVIOUSLY REPORTED AS NEGATIVE    C Diff interpretation Toxin producing C. difficile detected.     Comment: CORRECTED ON 01/31 AT 1157: PREVIOUSLY REPORTED AS Results are indeterminate. See PCR results.  Glucose, capillary     Status: Abnormal   Collection Time: 07/19/17 11:44 AM  Result Value Ref Range   Glucose-Capillary 161 (H) 65 - 99 mg/dL  Basic metabolic panel     Status: Abnormal   Collection Time: 08/15/17  3:12 PM  Result Value Ref Range   Glucose 103 (H) 65 - 99 mg/dL   BUN 11 8 - 27  mg/dL   Creatinine, Ser 0.81 0.76 - 1.27 mg/dL   GFR calc non Af Amer 88 >59 mL/min/1.73   GFR calc Af Amer 102 >59 mL/min/1.73   BUN/Creatinine Ratio 14 10 - 24   Sodium 143 134 - 144 mmol/L   Potassium 5.1 3.5 - 5.2 mmol/L   Chloride 104 96 - 106 mmol/L   CO2 25 20 - 29 mmol/L   Calcium 9.2 8.6 - 10.2 mg/dL  CBC     Status: Abnormal   Collection Time: 08/15/17  3:12 PM  Result Value Ref Range   WBC 8.0 3.4 - 10.8 x10E3/uL   RBC 3.88 (L) 4.14 - 5.80 x10E6/uL   Hemoglobin 8.2 (L) 13.0 - 17.7 g/dL   Hematocrit 27.9 (L) 37.5 - 51.0 %   MCV 72 (L) 79 - 97 fL   MCH 21.1 (L) 26.6 - 33.0 pg   MCHC 29.4 (L) 31.5 - 35.7 g/dL   RDW 17.2 (H) 12.3 - 15.4 %   Platelets 300 150 - 379 x10E3/uL  CBC     Status: Abnormal   Collection Time: 08/22/17  2:17 PM  Result Value Ref Range   WBC 6.9 3.4 - 10.8 x10E3/uL   RBC 3.70 (L) 4.14 - 5.80 x10E6/uL   Hemoglobin 8.1 (L) 13.0 - 17.7 g/dL   Hematocrit 26.6 (L) 37.5 - 51.0 %   MCV 72 (L) 79 - 97 fL   MCH 21.9 (L) 26.6 - 33.0 pg   MCHC 30.5 (L) 31.5 - 35.7 g/dL   RDW 16.6 (H) 12.3 - 15.4 %   Platelets 261 150 - 379 x10E3/uL  CBC     Status: Abnormal   Collection Time: 08/26/17  7:48 PM  Result Value Ref Range   WBC 6.4 4.0 - 10.5 K/uL   RBC 3.58 (L) 4.22 - 5.81 MIL/uL   Hemoglobin 7.6 (L) 13.0 - 17.0 g/dL   HCT 26.9 (L) 39.0 - 52.0 %   MCV 75.1 (L) 78.0 - 100.0 fL   MCH  21.2 (L) 26.0 - 34.0 pg   MCHC 28.3 (L) 30.0 - 36.0 g/dL   RDW 16.3 (H) 11.5 - 15.5 %   Platelets 246 150 - 400 K/uL    Comment: Performed at [facility], [address] [place], [place] [address]  Iron and TIBC     Status: Abnormal   Collection Time: 09/05/17  2:23 PM  Result Value Ref Range   Iron 8 (L) 45 - 182 ug/dL   TIBC 504 (H) 250 - 450 ug/dL   Saturation Ratios 2 (L) 17.9 - 39.5 %   UIBC 496 ug/dL    Comment: Performed at [facility], [place] [address], [place], [place] [address]  Ferritin     Status: Abnormal   Collection Time: 09/05/17  2:23 PM  Result Value Ref Range   Ferritin 2 (L) 24 - 336 ng/mL    Comment: Performed at [facility], [place] [address] [place], [place] [address]  Vitamin B12     Status: None   Collection Time: 09/05/17  2:23 PM  Result Value Ref Range   Vitamin B-12 728 180 - 914 pg/mL    Comment: (NOTE) This assay is not validated for testing neonatal or myeloproliferative syndrome specimens for Vitamin B12 levels. Performed at [facility], [place] [address] [place], [place] [address]   Folate     Status: None   Collection Time: 09/05/17  2:23 PM  Result Value Ref Range  Folate 28.4 >5.9 ng/mL    Comment: Performed at [facility], [place] [address] [place], [place] [address]  Haptoglobin     Status: Abnormal   Collection Time: 09/05/17  2:23 PM  Result Value Ref Range   Haptoglobin 289 (H) 34 - 200 mg/dL    Comment: (NOTE) Performed At: [facility] [place], [place] [id number] Rush Farmer MD [id number] Performed at [facility], [address] [place], [place] [address]   Erythropoietin     Status: Abnormal   Collection Time: 09/05/17  2:23 PM  Result Value Ref Range   Erythropoietin 116.4 (H) 2.6 - 18.5 mIU/mL    Comment: (NOTE) Beckman Coulter UniCel DxI [place] obtained with different assay methods or kits cannot be used interchangeably. Results cannot be  interpreted as absolute evidence of the presence or absence of malignant disease. Performed At: [facility] [place], [place] [id number] Rush Farmer MD [id number] Performed at [facility], [address], [place], [place] [address]   Reticulocytes     Status: Abnormal   Collection Time: 09/05/17  2:23 PM  Result Value Ref Range   Retic Ct Pct 1.0 0.4 - 3.1 %   RBC. 3.79 (L) 4.22 - 5.81 MIL/uL   Retic Count, Absolute 37.9 19.0 - 186.0 K/uL    Comment: Performed at [facility], [address], [place], [place] [address]  Lactate dehydrogenase     Status: None   Collection Time: 09/05/17  2:23 PM  Result Value Ref Range   LDH 142 98 - 192 U/L    Comment: Performed at [facility], [address], [place], [place] [address]  Protein electrophoresis, serum     Status: Abnormal   Collection Time: 09/05/17  2:23 PM  Result Value Ref Range   Total Protein ELP 6.9 6.0 - 8.5 g/dL   Albumin ELP 3.9 2.9 - 4.4 g/dL   Alpha-1-Globulin 0.3 0.0 - 0.4 g/dL   Alpha-2-Globulin 1.2 (H) 0.4 - 1.0 g/dL   Beta Globulin 1.2 0.7 - 1.3 g/dL   Gamma Globulin 0.3 (L) 0.4 - 1.8 g/dL   M-Spike, % Not Observed Not Observed g/dL   SPE Interp. Comment     Comment: (NOTE) The SPE pattern reflects hypogammaglobulinemia. Lowered gamma globulin levels may be found with monoclonal gammopathies, B-cell deficiency states, protein losing diseases, or may be transient in children. Serum immunofixation and examination of the urine for Bence Jones protein may be indicated if warranted by the clinical picture. Performed At: [facility] [place], [place] [id number] Rush Farmer MD [id number]    Comment Comment     Comment: (NOTE) Protein electrophoresis scan will follow via computer, mail, or courier delivery.    GLOBULIN, TOTAL 3.0 2.2 - 3.9 g/dL   A/G Ratio 1.3 0.7 - 1.7    Comment: Performed at [facility], [address], [place],  [address]  CBC  with Differential     Status: Abnormal   Collection Time: 09/05/17  2:23 PM  Result Value Ref Range   WBC 6.7 4.0 - 10.5 K/uL   RBC 3.79 (L) 4.22 - 5.81 MIL/uL   Hemoglobin 7.9 (L) 13.0 - 17.0 g/dL   HCT 28.6 (L) 39.0 - 52.0 %   MCV 75.5 (L) 78.0 - 100.0 fL   MCH 20.8 (L) 26.0 - 34.0 pg   MCHC 27.6 (L) 30.0 - 36.0 g/dL   RDW 16.2 (H) 11.5 - 15.5 %  Platelets 279 150 - 400 K/uL   Neutrophils Relative % 76 %   Neutro Abs 5.1 1.7 - 7.7 K/uL   Lymphocytes Relative 14 %   Lymphs Abs 1.0 0.7 - 4.0 K/uL   Monocytes Relative 8 %   Monocytes Absolute 0.5 0.1 - 1.0 K/uL   Eosinophils Relative 1 %   Eosinophils Absolute 0.1 0.0 - 0.7 K/uL   Basophils Relative 1 %   Basophils Absolute 0.1 0.0 - 0.1 K/uL    Comment: Performed at [facility], [address] [place], [place] [address]  TSH     Status: None   Collection Time: 09/05/17  2:23 PM  Result Value Ref Range   TSH 1.391 0.350 - 4.500 uIU/mL    Comment: Performed by a 3rd Generation assay with a functional sensitivity of <=0.01 uIU/mL. Performed at [facility], [address] [place], [place] [address]   Basic metabolic panel     Status: Abnormal   Collection Time: 10/03/17  9:19 PM  Result Value Ref Range   Sodium 139 135 - 145 mmol/L   Potassium 3.8 3.5 - 5.1 mmol/L   Chloride 104 101 - 111 mmol/L   CO2 23 22 - 32 mmol/L   Glucose, Bld 102 (H) 65 - 99 mg/dL   BUN 13 6 - 20 mg/dL   Creatinine, Ser 0.91 0.61 - 1.24 mg/dL   Calcium 8.9 8.9 - 10.3 mg/dL   GFR calc non Af Amer >60 >60 mL/min   GFR calc Af Amer >60 >60 mL/min    Comment: (NOTE) The eGFR has been calculated using the CKD EPI equation. This calculation has not been validated in all clinical situations. eGFR's persistently <60 mL/min signify possible Chronic Kidney Disease.    Anion gap 12 5 - 15    Comment: Performed at [place] [address] [place], [place] [address]  CBC     Status: Abnormal   Collection Time: 10/03/17  9:19 PM  Result Value  Ref Range   WBC 9.0 4.0 - 10.5 K/uL   RBC 4.26 4.22 - 5.81 MIL/uL   Hemoglobin 9.8 (L) 13.0 - 17.0 g/dL   HCT 33.7 (L) 39.0 - 52.0 %   MCV 79.1 78.0 - 100.0 fL   MCH 23.0 (L) 26.0 - 34.0 pg   MCHC 29.1 (L) 30.0 - 36.0 g/dL   RDW 22.1 (H) 11.5 - 15.5 %   Platelets 290 150 - 400 K/uL    Comment: Performed at [place] [address], [place], [place] [address]  Troponin I     Status: None   Collection Time: 10/03/17  9:19 PM  Result Value Ref Range   Troponin I <0.03 <0.03 ng/mL    Comment: Performed at [place] [address] [place], [place] [address]   Results for FARES, RAMTHUN (MRN [id number])   Ref. Range 09/05/2017 14:23  Iron Latest Ref Range: 45 - 182 ug/dL 8 (L)  UIBC Latest Units: ug/dL 496  TIBC Latest Ref Range: 250 - 450 ug/dL 504 (H)  Saturation Ratios Latest Ref Range: 17.9 - 39.5 % 2 (L)  Ferritin Latest Ref Range: 24 - 336 ng/mL 2 (L)  Folate Latest Ref Range: >5.9 ng/mL 28.4  Vitamin B12 Latest Ref Range: 180 - 914 pg/mL 728   RADIOGRAPHIC STUDIES: I have independently reviewed his chest x-ray films from recent ER visit.     ASSESSMENT & PLAN:   Iron deficiency anemia 1.  Severe iron deficiency anemia: -He started taking iron pill once daily around 20 March.  His energy levels remain the same.  He came to the ER with low back pain radiating to the chest.  CBC done at that time showed hemoglobin improved to 9.8 from 7.9 prior to start of iron pills.  He does not report any diarrhea or constipation.  His MCV has also improved from 75-79.  We had a discussion whether to give him 2 infusions of parenteral iron to increase his iron stores rapidly.  He would want to try increasing the iron twice daily.  We will asked him to come back in 6 weeks and repeat his CBC, ferritin, iron panel prior to next visit.  We will also check his stool cards to rule out bleeding from small bowel.  His SPEP from last visit was normal.  He was advised to take a stool  softener once he increases the iron tablets to twice daily.     Derek Jack, MD 10/10/17 11:27 AM

## 2017-10-16 ENCOUNTER — Ambulatory Visit (HOSPITAL_COMMUNITY)
Admission: RE | Admit: 2017-10-16 | Discharge: 2017-10-16 | Disposition: A | Discharge status: Home / Self Care | Payer: Medicare Other | Source: Ambulatory Visit | Attending: Vascular Surgery | Admitting: Vascular Surgery

## 2017-10-16 ENCOUNTER — Ambulatory Visit (INDEPENDENT_AMBULATORY_CARE_PROVIDER_SITE_OTHER): Payer: Medicare Other | Admitting: Vascular Surgery

## 2017-10-16 ENCOUNTER — Other Ambulatory Visit (HOSPITAL_COMMUNITY)
Admission: RE | Admit: 2017-10-16 | Discharge: 2017-10-16 | Disposition: A | Discharge status: Home / Self Care | Payer: Medicare Other | Source: Other Acute Inpatient Hospital | Attending: Hematology | Admitting: Hematology

## 2017-10-16 ENCOUNTER — Encounter: Payer: Self-pay | Admitting: Vascular Surgery

## 2017-10-16 VITALS — BP 140/61 | HR 93 | Resp 18 | Ht 72.0 in | Wt 154.0 lb

## 2017-10-16 DIAGNOSIS — I70213 Atherosclerosis of native arteries of extremities with intermittent claudication, bilateral legs: Secondary | ICD-10-CM | POA: Diagnosis present

## 2017-10-16 DIAGNOSIS — I70223 Atherosclerosis of native arteries of extremities with rest pain, bilateral legs: Secondary | ICD-10-CM | POA: Insufficient documentation

## 2017-10-16 DIAGNOSIS — I10 Essential (primary) hypertension: Secondary | ICD-10-CM | POA: Diagnosis not present

## 2017-10-16 DIAGNOSIS — E785 Hyperlipidemia, unspecified: Secondary | ICD-10-CM | POA: Insufficient documentation

## 2017-10-16 DIAGNOSIS — I2510 Atherosclerotic heart disease of native coronary artery without angina pectoris: Secondary | ICD-10-CM | POA: Diagnosis not present

## 2017-10-16 DIAGNOSIS — D509 Iron deficiency anemia, unspecified: Secondary | ICD-10-CM | POA: Insufficient documentation

## 2017-10-16 DIAGNOSIS — I259 Chronic ischemic heart disease, unspecified: Secondary | ICD-10-CM

## 2017-10-16 LAB — OCCULT BLOOD X 1 CARD TO LAB, STOOL
FECAL OCCULT BLD: POSITIVE — AB
FECAL OCCULT BLD: POSITIVE — AB
Fecal Occult Bld: POSITIVE — AB

## 2017-10-16 NOTE — Progress Notes (Signed)
[facility] [place]  Patient name: John Marsh MRN: [id number] DOB: 1946-03-25 Sex: male  REASON FOR VISIT: Follow-up known peripheral vascular occlusive disease  HPI: John Marsh is a 72 y.o. male here today for follow-up.  He has had multiple medical issues since my last visit with him in January 2018.  He has had admissions for exacerbation of COPD with sepsis and also for atrial fibrillation.  He continues to have discomfort in both legs.  He reports that he has an aching sensation in both thighs then that the left is somewhat worse than the right.  He reports that if he walks for any distance he begins to have aching.  He also reports he has similar sensation at night and aching in both thighs.  He has no lower extremity tissue loss.  Past Medical History:  Diagnosis Date  . Anemia   . Atrial fibrillation (HCC)    Chadsvasc2=3  . COPD (chronic obstructive pulmonary disease) (HCC)   . Coronary heart disease   . Emphysema of lung (HCC)   . Hyperlipidemia   . Hypertension   . Iron deficiency anemia   . MI (myocardial infarction) (HCC)   . Multiple lung nodules on CT   . Nocturnal hypoxia   . Peripheral arterial disease (HCC)   . Shortness of breath dyspnea     Family History  Problem Relation Age of Onset  . Heart disease Mother        died at 64  . Heart disease Father   . Heart disease Sister        from rheumatic fever as a child  . Heart disease Paternal Grandfather     SOCIAL HISTORY: Social History   Tobacco Use  . Smoking status: Current Every Day Smoker    Packs/day: 0.20    Years: 54.00    Pack years: 10.80    Types: Cigarettes    Start date: 08/01/1959  . Smokeless tobacco: Never Used  . Tobacco comment: smokes 4-5 cigerettes/day  Substance Use Topics  . Alcohol use: No    Alcohol/week: 0.0 oz    No Known Allergies  Current Outpatient Medications  Medication Sig Dispense Refill  .  acetaminophen (TYLENOL) 325 MG tablet Take 650 mg by mouth every 6 (six) hours as needed for mild pain.    Marland Kitchen albuterol (PROVENTIL HFA;VENTOLIN HFA) 108 (90 Base) MCG/ACT inhaler Inhale 2 puffs into the lungs every 6 (six) hours as needed for wheezing or shortness of breath. 1 Inhaler 6  . apixaban (ELIQUIS) 5 MG TABS tablet Take 1 tablet (5 mg total) 2 (two) times daily by mouth. 60 tablet 0  . ATROVENT HFA 17 MCG/ACT inhaler Take 2 puffs by mouth 4 (four) times daily.  5  . budesonide (PULMICORT) 0.5 MG/2ML nebulizer solution Take 2 mLs (0.5 mg total) by nebulization 2 (two) times daily. 60 mL 3  . diltiazem (CARDIZEM CD) 360 MG 24 hr capsule Take 1 capsule (360 mg total) daily by mouth. 60 capsule 0  . feeding supplement, ENSURE ENLIVE, (ENSURE ENLIVE) LIQD Take 237 mLs by mouth 3 (three) times daily between meals. 30 Bottle 0  . fluticasone (FLONASE) 50 MCG/ACT nasal spray Place 2 sprays into both nostrils daily as needed for allergies or rhinitis. 16 g 0  . formoterol (PERFOROMIST) 20 MCG/2ML nebulizer solution Take 2 mLs (20 mcg total) by nebulization 2 (two) times daily. DX code: J43.8 120 mL 3  . gabapentin (NEURONTIN)  100 MG capsule Take 1 capsule (100 mg total) 2 (two) times daily by mouth. 60 capsule 0  . guaiFENesin (MUCINEX) 600 MG 12 hr tablet Take 1 tablet (600 mg total) by mouth 2 (two) times daily. 30 tablet 0  . menthol-cetylpyridinium (CEPACOL) 3 MG lozenge Take 1 lozenge (3 mg total) as needed by mouth for sore throat. 100 tablet 12  . montelukast (SINGULAIR) 5 MG chewable tablet CHEW 1 TABLET BY MOUTH AT BEDTIME 30 tablet 3  . nitroGLYCERIN (NITROSTAT) 0.4 MG SL tablet Place 0.4 mg under the tongue every 5 (five) minutes as needed for chest pain. x3 doses as needed for chest pain    . pantoprazole (PROTONIX) 40 MG tablet Take 1 tablet (40 mg total) by mouth daily. 30 tablet 0  . pramipexole (MIRAPEX) 0.125 MG tablet Take 1 tablet by mouth at bedtime.    . rosuvastatin (CRESTOR) 20  MG tablet Take 20 mg by mouth at bedtime.    . sodium chloride (OCEAN) 0.65 % SOLN nasal spray Place 1 spray as needed into both nostrils for congestion (nose irritation). 2 Bottle 0  . Spacer/Aero-Holding Chambers (AEROCHAMBER Z-STAT PLUS) inhaler Use as instructed 1 each 0  . tamsulosin (FLOMAX) 0.4 MG CAPS capsule Take 0.4 mg by mouth.    . triamcinolone (NASACORT ALLERGY 24HR) 55 MCG/ACT AERO nasal inhaler Place 2 sprays daily as needed into the nose (allergies).     . benzonatate (TESSALON) 100 MG capsule Take 1 capsule (100 mg total) 3 (three) times daily by mouth. (Patient not taking: Reported on 10/16/2017) 20 capsule 0  . HYDROcodone-acetaminophen (NORCO/VICODIN) 5-325 MG tablet Take by mouth.    . predniSONE (DELTASONE) 10 MG tablet Take  daily for 3 days,Take  daily for 3days,Take  daily for 3days,Take  daily for 3days,Take  daily for 3days, then stop. (Patient not taking: Reported on 10/16/2017) 40 tablet 0   No current facility-administered medications for this visit.     REVIEW OF SYSTEMS:   denotes positive finding,  denotes negative finding Cardiac  Comments:  Chest pain or chest pressure:    Shortness of breath upon exertion: x   Short of breath when lying flat: x   Irregular heart rhythm: x       Vascular    Pain in calf, thigh, or hip brought on by ambulation: x   Pain in feet at night that wakes you up from your sleep:  x   Blood clot in your veins:    Leg swelling:           PHYSICAL EXAM: Vitals:   10/16/17 1044 10/16/17 1045  BP: (!) 141/62 140/61  Pulse: 93   Resp: 18   SpO2: 98%   Weight: 154 lb (69.9 kg)   Height: 6' (1.829 m)     GENERAL: The patient is a well-nourished male, in no acute distress. The vital signs are documented above. CARDIOVASCULAR: 2+ radial pulses bilaterally.  On the right he has 2+ popliteal and 2+ posterior tibial pulse.  On the left I do not palpate a popliteal or distal pulse PULMONARY: There is good  air exchange  MUSCULOSKELETAL: There are no major deformities or cyanosis. NEUROLOGIC: No focal weakness or paresthesias are detected. SKIN: There are no ulcers or rashes noted. PSYCHIATRIC: The patient has a normal affect.  DATA:  Studies today were compared with 08/02/16.  Ankle arm index on the right is 0.76 on the left is 0.63 which is slightly lower  than his prior studies January 2018 where his ankle arm index was 0.82 on the right and 0.66 on the left  MEDICAL ISSUES: A long discussion with the patient and his wife present.  I do not feel that he is having any symptoms related to his known SFA occlusive disease on the left.  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  He has been diagnosed with sciatica and I feel that this is more neuropathic since he does not have any arterial blockages that would explain thigh pain.  He was reassured with this discussion and will see [place] again on an as-needed basis    Larina Earthly, MD [facility] Tel [phone] Pager [phone]

## 2017-10-24 ENCOUNTER — Ambulatory Visit: Payer: Medicare Other | Admitting: Pulmonary Disease

## 2017-10-24 ENCOUNTER — Ambulatory Visit (INDEPENDENT_AMBULATORY_CARE_PROVIDER_SITE_OTHER): Payer: Medicare Other | Admitting: Gastroenterology

## 2017-10-24 ENCOUNTER — Encounter: Payer: Self-pay | Admitting: Gastroenterology

## 2017-10-24 ENCOUNTER — Other Ambulatory Visit (INDEPENDENT_AMBULATORY_CARE_PROVIDER_SITE_OTHER): Payer: Medicare Other

## 2017-10-24 VITALS — BP 138/64 | HR 78 | Ht 72.0 in | Wt 153.2 lb

## 2017-10-24 DIAGNOSIS — D649 Anemia, unspecified: Secondary | ICD-10-CM | POA: Diagnosis not present

## 2017-10-24 DIAGNOSIS — I259 Chronic ischemic heart disease, unspecified: Secondary | ICD-10-CM | POA: Diagnosis not present

## 2017-10-24 LAB — CBC WITH DIFFERENTIAL/PLATELET
BASOS PCT: 0.9 % (ref 0.0–3.0)
Basophils Absolute: 0.1 10*3/uL (ref 0.0–0.1)
EOS PCT: 2.4 % (ref 0.0–5.0)
Eosinophils Absolute: 0.2 10*3/uL (ref 0.0–0.7)
HEMATOCRIT: 34.2 % — AB (ref 39.0–52.0)
HEMOGLOBIN: 11 g/dL — AB (ref 13.0–17.0)
LYMPHS PCT: 13.6 % (ref 12.0–46.0)
Lymphs Abs: 1 10*3/uL (ref 0.7–4.0)
MCHC: 32.1 g/dL (ref 30.0–36.0)
MCV: 78.5 fl (ref 78.0–100.0)
MONOS PCT: 6.1 % (ref 3.0–12.0)
Monocytes Absolute: 0.4 10*3/uL (ref 0.1–1.0)
Neutro Abs: 5.4 10*3/uL (ref 1.4–7.7)
Neutrophils Relative %: 77 % (ref 43.0–77.0)
Platelets: 265 10*3/uL (ref 150.0–400.0)
RBC: 4.36 Mil/uL (ref 4.22–5.81)
RDW: 25 % — AB (ref 11.5–15.5)
WBC: 7 10*3/uL (ref 4.0–10.5)

## 2017-10-24 NOTE — Progress Notes (Signed)
Review of pertinent gastrointestinal problems: 1.  Significant iron deficiency anemia, hemoglobin around 7, microcytic.  On Eliquis.  Work-up while hospitalized January 2019 included colonoscopy that showed normal terminal ileum and classic appearing pseudomembranes from C. difficile, toxin positive as well.  Treated with vancomycin. EGD 06/2017 was normal, biopsied from duodenum were normal. 2. Rather incidental C. Diff, see above.  HPI: This is a very pleasant 72 year old man whom I last saw when he was hospitalized with significant iron deficiency anemia.  I performed a colonoscopy and upper endoscopy.  See those results summarized above it was interesting that he had rather incidental significant C. difficile infection in his colon.  He was treated with vancomycin orally and his fairly loose stools improved back to his more baseline alternating constipation, loose stool since then.  He takes Eliquis chronically.  Chief complaint is anemia  I last saw him 3 or 4 months ago at the time of hospitalization that included colonoscopy, upper endoscopy for iron deficiency anemia.  He was incidentally found to have C. difficile infection in his colon.  Treated with vancomycin orally with good resolution of rather minor loose stools.  For unclear reasons he did not start iron supplements after that hospitalization but he did start iron for 5 weeks ago.  His hemoglobin has improved.  Last checked about a month ago his hemoglobin was 9.8  Stool recently + for FOBT  He's lost 60 pounds in about a year.  No abdominal pains.  Has been on home o2 for 2 years or so.  He is on iron supplement: started this about a month ago.  Taking two pills per day.Marland Kitchen acutally only takes one per day.   Blood work April 2019 show hemoglobin 9.8, MCV 79.  In March his hemoglobin was 7.6 with a  MCV of 72.  ROS: complete GI ROS as described in HPI, all other review negative.  Constitutional:  No unintentional weight  loss   Past Medical History:  Diagnosis Date  . Anemia   . Atrial fibrillation (HCC)    Chadsvasc2=3  . COPD (chronic obstructive pulmonary disease) (HCC)   . Coronary heart disease   . Emphysema of lung (HCC)   . Hyperlipidemia   . Hypertension   . Iron deficiency anemia   . MI (myocardial infarction) (HCC)   . Multiple lung nodules on CT   . Nocturnal hypoxia   . Peripheral arterial disease (HCC)   . Shortness of breath dyspnea     Past Surgical History:  Procedure Laterality Date  . CHOLECYSTECTOMY    . COLONOSCOPY WITH PROPOFOL N/A 07/19/2017   Procedure: COLONOSCOPY WITH PROPOFOL;  Surgeon: Rachael Fee, MD;  Location: [facility];  Service: Endoscopy;  Laterality: N/A;  . ESOPHAGOGASTRODUODENOSCOPY (EGD) WITH PROPOFOL N/A 07/19/2017   Procedure: ESOPHAGOGASTRODUODENOSCOPY (EGD) WITH PROPOFOL;  Surgeon: Rachael Fee, MD;  Location: [facility];  Service: Endoscopy;  Laterality: N/A;  . NASAL SEPTUM SURGERY    . PERIPHERAL VASCULAR CATHETERIZATION N/A 07/27/2015   Procedure: Abdominal Aortogram;  Surgeon: Chuck Hint, MD;  Location: [facility] INVASIVE CV LAB;  Service: Cardiovascular;  Laterality: N/A;  . PERIPHERAL VASCULAR CATHETERIZATION Bilateral 07/27/2015   Procedure: Lower Extremity Angiography;  Surgeon: Chuck Hint, MD;  Location: [facility] INVASIVE CV LAB;  Service: Cardiovascular;  Laterality: Bilateral;  . stent placed      Current Outpatient Medications  Medication Sig Dispense Refill  . acetaminophen (TYLENOL) 325 MG tablet Take 650 mg by mouth every 6 (six)  hours as needed for mild pain.    Marland Kitchen albuterol (PROVENTIL HFA;VENTOLIN HFA) 108 (90 Base) MCG/ACT inhaler Inhale 2 puffs into the lungs every 6 (six) hours as needed for wheezing or shortness of breath. 1 Inhaler 6  . apixaban (ELIQUIS) 5 MG TABS tablet Take 1 tablet (5 mg total) 2 (two) times daily by mouth. 60 tablet 0  . ATROVENT HFA 17 MCG/ACT inhaler Take 2 puffs by mouth 4 (four) times  daily.  5  . budesonide (PULMICORT) 0.5 MG/2ML nebulizer solution Take 2 mLs (0.5 mg total) by nebulization 2 (two) times daily. 60 mL 3  . diltiazem (CARDIZEM CD) 360 MG 24 hr capsule Take 1 capsule (360 mg total) daily by mouth. 60 capsule 0  . feeding supplement, ENSURE ENLIVE, (ENSURE ENLIVE) LIQD Take 237 mLs by mouth 3 (three) times daily between meals. 30 Bottle 0  . fluticasone (FLONASE) 50 MCG/ACT nasal spray Place 2 sprays into both nostrils daily as needed for allergies or rhinitis. 16 g 0  . formoterol (PERFOROMIST) 20 MCG/2ML nebulizer solution Take 2 mLs (20 mcg total) by nebulization 2 (two) times daily. DX code: J43.8 120 mL 3  . gabapentin (NEURONTIN) 100 MG capsule Take 1 capsule (100 mg total) 2 (two) times daily by mouth. 60 capsule 0  . guaiFENesin (MUCINEX) 600 MG 12 hr tablet Take 1 tablet (600 mg total) by mouth 2 (two) times daily. 30 tablet 0  . menthol-cetylpyridinium (CEPACOL) 3 MG lozenge Take 1 lozenge (3 mg total) as needed by mouth for sore throat. 100 tablet 12  . montelukast (SINGULAIR) 5 MG chewable tablet CHEW 1 TABLET BY MOUTH AT BEDTIME 30 tablet 3  . nitroGLYCERIN (NITROSTAT) 0.4 MG SL tablet Place 0.4 mg under the tongue every 5 (five) minutes as needed for chest pain. x3 doses as needed for chest pain    . pantoprazole (PROTONIX) 40 MG tablet Take 1 tablet (40 mg total) by mouth daily. 30 tablet 0  . pramipexole (MIRAPEX) 0.125 MG tablet Take 1 tablet by mouth at bedtime.    . rosuvastatin (CRESTOR) 20 MG tablet Take 20 mg by mouth at bedtime.    . sodium chloride (OCEAN) 0.65 % SOLN nasal spray Place 1 spray as needed into both nostrils for congestion (nose irritation). 2 Bottle 0  . Spacer/Aero-Holding Chambers (AEROCHAMBER Z-STAT PLUS) inhaler Use as instructed 1 each 0  . tamsulosin (FLOMAX) 0.4 MG CAPS capsule Take 0.4 mg by mouth.    . triamcinolone (NASACORT ALLERGY 24HR) 55 MCG/ACT AERO nasal inhaler Place 2 sprays daily as needed into the nose  (allergies).      No current facility-administered medications for this visit.     Allergies as of 10/24/2017  . (No Known Allergies)    Family History  Problem Relation Age of Onset  . Heart disease Mother        died at 76  . Heart disease Father   . Heart disease Sister        from rheumatic fever as a child  . Heart disease Paternal Grandfather     Social History   Socioeconomic History  . Marital status: Married    Spouse name: Not on file  . Number of children: Not on file  . Years of education: Not on file  . Highest education level: Not on file  Occupational History  . Occupation: retired  Engineer, production  . Financial resource strain: Not on file  . Food insecurity:    Worry:  Not on file    Inability: Not on file  . Transportation needs:    Medical: Not on file    Non-medical: Not on file  Tobacco Use  . Smoking status: Current Every Day Smoker    Packs/day: 0.25    Years: 54.00    Pack years: 13.50    Types: Cigarettes    Start date: 08/01/1959  . Smokeless tobacco: Never Used  . Tobacco comment: smokes 4-5 cigerettes/day  Substance and Sexual Activity  . Alcohol use: No    Alcohol/week: 0.0 oz  . Drug use: No  . Sexual activity: Never  Lifestyle  . Physical activity:    Days per week: Not on file    Minutes per session: Not on file  . Stress: Not on file  Relationships  . Social connections:    Talks on phone: Not on file    Gets together: Not on file    Attends religious service: Not on file    Active member of club or organization: Not on file    Attends meetings of clubs or organizations: Not on file    Relationship status: Not on file  . Intimate partner violence:    Fear of current or ex partner: Not on file    Emotionally abused: Not on file    Physically abused: Not on file    Forced sexual activity: Not on file  Other Topics Concern  . Not on file  Social History Narrative   Originally from [place]. Previously has lived in [place]. He moved  to [facility] in [place]. He serve in [place]. 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 He has been to [place], [place], [place], several countries in [place], countries in [place], [place], & multiple [place]. 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 He has a dog, 7 cats, & 2 horses. Remote exposure to a parrot for a few years in a different home. No mold exposure. No hot tub exposure. 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      Physical Exam: BP 138/64   Pulse 78   Ht 6' (1.829 m)   Wt 153 lb 4 oz (69.5 kg)   BMI 20.78 kg/m  Constitutional: Chronically ill frail, thin man on oxygen Psychiatric: alert and oriented x3 Abdomen: soft, nontender, nondistended, no obvious ascites, no peritoneal signs, normal bowel sounds No peripheral edema noted in lower extremities  Assessment and plan: 72 y.o. male with iron deficiency anemia, severe COPD, emphysema on home O2  It seems like iron supplements have begin to help his anemia.  He started them a month or 2 ago.  Going to recheck his hemoglobin today.  If it is improving further than he knows he should continue once daily iron probably indefinitely.  He has severe COPD and becomes winded just from carrying around his oxygen container.  I recommend strongly against any invasive testing if his hemoglobin is stable on oral iron supplement.  I think given colonoscopy, upper endoscopy, abdominal CT scan it is unlikely he has anything serious going on.  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  Finding them and treating them in his situation with severe COPD is not advisable.  He will return in 3-4 months as well.  Please  see the "Patient Instructions" section for addition details about the plan.  Rob Bunting, MD [facility] 10/24/2017, 9:32 AM

## 2017-10-24 NOTE — Patient Instructions (Addendum)
You will have labs checked today in the basement lab.  Please head down after you check out with the front desk  (cbc). Stay on iron once daily for now. Please return to see Dr. Christella Hartigan in 3-4 months.  Normal BMI (Body Mass Index- based on height and weight) is between 23 and 30. Your BMI today is Body mass index is 20.78 kg/m. John Marsh Please consider follow up  regarding your BMI with your Primary Care Provider.  RTO to see Dr Christella Hartigan 01/18/18 at 9am

## 2017-10-25 ENCOUNTER — Other Ambulatory Visit: Payer: Self-pay

## 2017-10-25 DIAGNOSIS — D649 Anemia, unspecified: Secondary | ICD-10-CM

## 2017-10-31 ENCOUNTER — Ambulatory Visit: Payer: Medicare Other | Admitting: Acute Care

## 2017-11-20 ENCOUNTER — Other Ambulatory Visit (HOSPITAL_COMMUNITY): Payer: Medicare Other

## 2017-11-21 ENCOUNTER — Ambulatory Visit (HOSPITAL_COMMUNITY): Payer: Medicare Other | Admitting: Hematology

## 2017-11-21 ENCOUNTER — Other Ambulatory Visit (HOSPITAL_COMMUNITY): Payer: Medicare Other

## 2017-11-23 ENCOUNTER — Inpatient Hospital Stay (HOSPITAL_COMMUNITY): Payer: Medicare Other | Attending: Adult Health

## 2017-11-23 DIAGNOSIS — D509 Iron deficiency anemia, unspecified: Secondary | ICD-10-CM | POA: Diagnosis not present

## 2017-11-23 LAB — CBC WITH DIFFERENTIAL/PLATELET
BASOS ABS: 0.1 10*3/uL (ref 0.0–0.1)
Basophils Relative: 1 %
EOS PCT: 3 %
Eosinophils Absolute: 0.1 10*3/uL (ref 0.0–0.7)
HEMATOCRIT: 38 % — AB (ref 39.0–52.0)
HEMOGLOBIN: 11.6 g/dL — AB (ref 13.0–17.0)
LYMPHS ABS: 1.1 10*3/uL (ref 0.7–4.0)
LYMPHS PCT: 22 %
MCH: 26 pg (ref 26.0–34.0)
MCHC: 30.5 g/dL (ref 30.0–36.0)
MCV: 85.2 fL (ref 78.0–100.0)
Monocytes Absolute: 0.4 10*3/uL (ref 0.1–1.0)
Monocytes Relative: 8 %
NEUTROS ABS: 3.3 10*3/uL (ref 1.7–7.7)
Neutrophils Relative %: 66 %
Platelets: 198 10*3/uL (ref 150–400)
RBC: 4.46 MIL/uL (ref 4.22–5.81)
RDW: 18.3 % — ABNORMAL HIGH (ref 11.5–15.5)
WBC: 5 10*3/uL (ref 4.0–10.5)

## 2017-11-23 LAB — IRON AND TIBC
Iron: 176 ug/dL (ref 45–182)
SATURATION RATIOS: 47 % — AB (ref 17.9–39.5)
TIBC: 377 ug/dL (ref 250–450)
UIBC: 201 ug/dL

## 2017-11-23 LAB — FERRITIN: FERRITIN: 14 ng/mL — AB (ref 24–336)

## 2017-11-27 ENCOUNTER — Ambulatory Visit (INDEPENDENT_AMBULATORY_CARE_PROVIDER_SITE_OTHER): Payer: Medicare Other | Admitting: Pulmonary Disease

## 2017-11-27 ENCOUNTER — Encounter: Payer: Self-pay | Admitting: Pulmonary Disease

## 2017-11-27 VITALS — BP 120/82 | HR 76 | Ht 72.0 in | Wt 149.0 lb

## 2017-11-27 DIAGNOSIS — J432 Centrilobular emphysema: Secondary | ICD-10-CM | POA: Diagnosis not present

## 2017-11-27 DIAGNOSIS — I259 Chronic ischemic heart disease, unspecified: Secondary | ICD-10-CM

## 2017-11-27 DIAGNOSIS — J438 Other emphysema: Secondary | ICD-10-CM

## 2017-11-27 DIAGNOSIS — J9621 Acute and chronic respiratory failure with hypoxia: Secondary | ICD-10-CM | POA: Diagnosis not present

## 2017-11-27 DIAGNOSIS — R911 Solitary pulmonary nodule: Secondary | ICD-10-CM

## 2017-11-27 MED ORDER — TIOTROPIUM BROMIDE MONOHYDRATE 2.5 MCG/ACT IN AERS
2.0000 | INHALATION_SPRAY | Freq: Every day | RESPIRATORY_TRACT | 5 refills | Status: DC
Start: 2017-11-27 — End: 2018-11-27

## 2017-11-27 NOTE — Progress Notes (Signed)
Synopsis: Former patient of Dr. Jamison Neighbor with COPD, chronic respiratory failure with hypoxemia.  He used to smoke 3-4 packs per day.  He still smokes as of 2019.  He worked as a Chartered certified accountant.  Subjective:   PATIENT ID: John Marsh GENDER: male DOB: 17-Jul-1945, MRN: [id number]   HPI  Chief Complaint  Patient presents with  . Consult    SOB ,coughing up white mucus, Wheezing somtimes.    Dyspnea: > its pretty bad > he will gasp for breath after walking just a few feet > he really can't exercise much because of the severity of his peripheral arterial disease > the oxygen concentrator is heavy enough to bother him  Chronic respiratory failure with hypoxemia > uses 2-3 L all the time  COPD: > he has flares of COPD a few times per year > he produces mucus more in the mornings, typically every morning > he takes Pulmicort and Perforomist > he does not take atravent regularly  Tobacco abuse: > he says that he is still smoking cigarettes  Past Medical History:  Diagnosis Date  . Anemia   . Atrial fibrillation (HCC)    Chadsvasc2=3  . COPD (chronic obstructive pulmonary disease) (HCC)   . Coronary heart disease   . Emphysema of lung (HCC)   . Hyperlipidemia   . Hypertension   . Iron deficiency anemia   . MI (myocardial infarction) (HCC)   . Multiple lung nodules on CT   . Nocturnal hypoxia   . Peripheral arterial disease (HCC)   . Shortness of breath dyspnea      Family History  Problem Relation Age of Onset  . Heart disease Mother        died at 61  . Heart disease Father   . Heart disease Sister        from rheumatic fever as a child  . Heart disease Paternal Grandfather      Social History   Socioeconomic History  . Marital status: Married    Spouse name: Not on file  . Number of children: Not on file  . Years of education: Not on file  . Highest education level: Not on file  Occupational History  . Occupation: retired  Engineer, production  . Financial resource  strain: Not on file  . Food insecurity:    Worry: Not on file    Inability: Not on file  . Transportation needs:    Medical: Not on file    Non-medical: Not on file  Tobacco Use  . Smoking status: Current Every Day Smoker    Packs/day: 0.25    Years: 54.00    Pack years: 13.50    Types: Cigarettes    Start date: 08/01/1959  . Smokeless tobacco: Never Used  . Tobacco comment: smokes 4-5 cigerettes/day  Substance and Sexual Activity  . Alcohol use: No    Alcohol/week: 0.0 oz  . Drug use: No  . Sexual activity: Never  Lifestyle  . Physical activity:    Days per week: Not on file    Minutes per session: Not on file  . Stress: Not on file  Relationships  . Social connections:    Talks on phone: Not on file    Gets together: Not on file    Attends religious service: Not on file    Active member of club or organization: Not on file    Attends meetings of clubs or organizations: Not on file    Relationship status: Not  on file  . Intimate partner violence:    Fear of current or ex partner: Not on file    Emotionally abused: Not on file    Physically abused: Not on file    Forced sexual activity: Not on file  Other Topics Concern  . Not on file  Social History Narrative   Originally from [place]. Previously has lived in [place]. He moved to [facility] in 1964. He serve in [place]. 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 He has been to [place], [place], [place], several countries in [place], countries in [place], [place], & multiple [place]. 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 He has a dog, 7 cats, & 2 horses. Remote exposure to a parrot for a few years in a different home. No mold exposure. No hot tub exposure. 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      No Known Allergies   Outpatient Medications Prior to Visit  Medication Sig Dispense Refill  . acetaminophen  (TYLENOL) 325 MG tablet Take 650 mg by mouth every 6 (six) hours as needed for mild pain.    Marland Kitchen albuterol (PROVENTIL HFA;VENTOLIN HFA) 108 (90 Base) MCG/ACT inhaler Inhale 2 puffs into the lungs every 6 (six) hours as needed for wheezing or shortness of breath. 1 Inhaler 6  . apixaban (ELIQUIS) 5 MG TABS tablet Take 1 tablet (5 mg total) 2 (two) times daily by mouth. 60 tablet 0  . ATROVENT HFA 17 MCG/ACT inhaler Take 2 puffs by mouth 4 (four) times daily.  5  . budesonide (PULMICORT) 0.5 MG/2ML nebulizer solution Take 2 mLs (0.5 mg total) by nebulization 2 (two) times daily. 60 mL 3  . diltiazem (CARDIZEM CD) 360 MG 24 hr capsule Take 1 capsule (360 mg total) daily by mouth. 60 capsule 0  . feeding supplement, ENSURE ENLIVE, (ENSURE ENLIVE) LIQD Take 237 mLs by mouth 3 (three) times daily between meals. 30 Bottle 0  . fluticasone (FLONASE) 50 MCG/ACT nasal spray Place 2 sprays into both nostrils daily as needed for allergies or rhinitis. 16 g 0  . formoterol (PERFOROMIST) 20 MCG/2ML nebulizer solution Take 2 mLs (20 mcg total) by nebulization 2 (two) times daily. DX code: J43.8 120 mL 3  . gabapentin (NEURONTIN) 100 MG capsule Take 1 capsule (100 mg total) 2 (two) times daily by mouth. 60 capsule 0  . guaiFENesin (MUCINEX) 600 MG 12 hr tablet Take 1 tablet (600 mg total) by mouth 2 (two) times daily. 30 tablet 0  . menthol-cetylpyridinium (CEPACOL) 3 MG lozenge Take 1 lozenge (3 mg total) as needed by mouth for sore throat. 100 tablet 12  . montelukast (SINGULAIR) 5 MG chewable tablet CHEW 1 TABLET BY MOUTH AT BEDTIME 30 tablet 3  . nitroGLYCERIN (NITROSTAT) 0.4 MG SL tablet Place 0.4 mg under the tongue every 5 (five) minutes as needed for chest pain. x3 doses as needed for chest pain    . pantoprazole (PROTONIX) 40 MG tablet Take 1 tablet (40 mg total) by mouth daily. 30 tablet 0  . pramipexole (MIRAPEX) 0.125 MG tablet Take 1 tablet by mouth at bedtime.    . rosuvastatin (CRESTOR) 20 MG tablet  Take 20 mg by mouth at bedtime.    . sodium chloride (OCEAN) 0.65 % SOLN nasal spray Place 1 spray as needed into both nostrils for congestion (nose irritation). 2 Bottle 0  .  Spacer/Aero-Holding Chambers (AEROCHAMBER Z-STAT PLUS) inhaler Use as instructed 1 each 0  . tamsulosin (FLOMAX) 0.4 MG CAPS capsule Take 0.4 mg by mouth.    . triamcinolone (NASACORT ALLERGY 24HR) 55 MCG/ACT AERO nasal inhaler Place 2 sprays daily as needed into the nose (allergies).      No facility-administered medications prior to visit.     Review of Systems  Constitutional: Positive for malaise/fatigue. Negative for chills and diaphoresis.  HENT: Negative for congestion, nosebleeds and sinus pain.   Respiratory: Positive for cough, sputum production, shortness of breath and wheezing.   Cardiovascular: Positive for claudication. Negative for chest pain and leg swelling.  Skin: Negative for itching and rash.      Objective:  Physical Exam   Vitals:   11/27/17 0927  BP: 120/82  Pulse: 76  SpO2: 99%  Weight: 149 lb (67.6 kg)  Height: 6' (1.829 m)  2L [place]  Gen: chronically ill appearing HENT: OP clear,  neck supple PULM: Poor air movement B, normal percussion CV: RRR, no mgr, trace edema GI: BS+, soft, nontender Derm: no cyanosis or rash Psyche: normal mood and affect  CBC    Component Value Date/Time   WBC 5.0 11/23/2017 1320   RBC 4.46 11/23/2017 1320   HGB 11.6 (L) 11/23/2017 1320   HGB 8.1 (L) 08/22/2017 1417   HCT 38.0 (L) 11/23/2017 1320   HCT 26.6 (L) 08/22/2017 1417   PLT 198 11/23/2017 1320   PLT 261 08/22/2017 1417   MCV 85.2 11/23/2017 1320   MCV 72 (L) 08/22/2017 1417   MCH 26.0 11/23/2017 1320   MCHC 30.5 11/23/2017 1320   RDW 18.3 (H) 11/23/2017 1320   RDW 16.6 (H) 08/22/2017 1417   LYMPHSABS 1.1 11/23/2017 1320   MONOABS 0.4 11/23/2017 1320   EOSABS 0.1 11/23/2017 1320   BASOSABS 0.1 11/23/2017 1320     PFT  05/30/16: FVC 2.46 L (51%) FEV1 1.06 L (30%) FEV1/FVC  0.43 FEF 25-75 0.3 L (14%) positive bronchodilator response 06/03/15: FVC 3.07 L (63%) FEV1 1.20 L (33%) FEV1/FVC 0.39 FEF 25-75 0.47 L (17%) negative bronchodilator response 09/14/14: FVC 2.50 L (51%) FEV1 1.15 L (32%) FEV1/FVC 0.46 FEF 25-75 0.33 L (12%) positive bronchodilator response TLC 9.10 L (120%) RV 236% DLCO uncorrected 36%  6MWT 02/13/17:  Walked 207 meters / Baseline Sat 100% on RA / Nadir Sat 98% on RA 11/23/16:  Only able to complete 1 lap w/ lowest saturation 97% 05/30/16:  Walked 399 meters / Baseline Sat 97% on RA / Nadir Sat 95% on RA 06/03/15:  Walked 336 meters / Baseline Sat 97% on RA / Nadir Sat 97% on RA  OVERNIGHT PULSE OX 03/09/15:Performed on RA. 9:12:32 analyzed. Spent 10:32 with saturation </= 88%. 08/18/14: Performed on room air & 7 hours 56 minutes analyzed. Lowest saturation 83%. 14.3 minutes with saturation </= 88%. 73 desaturations events. Lowest pulse 56 bpm.  IMAGING  CTA CHEST 05/01/17 :  Calcified granuloma within left upper lobe. Apical predominant centrilobular and paraseptal emphysema without focal consolidation. Left lower lobe 4 mm nodule previously measuring 7 mm and right lower lobe nodule which appears as new measuring 7 mm. No pleural effusion or thickening. No pericardial effusion. No pulmonary embolism. No pathologic mediastinal adenopathy.  CT CHEST W/O 02/01/17:  Bilateral nodules with nodular opacities within right upper lobe likely distorted by underlying apical predominant emphysema. Difficult to discern whether or not these nodules are progressive. No pleural effusion or thickening. No pathologic mediastinal adenopathy. No  pericardial effusion.  CTA CHEST 09/22/16:  No pulmonary embolism. Apical predominant emphysematous changes noted. Right upper lobe nodule measuring up to 1.2 cm with some groundglass component on my review and spiculation likely due to distortion from emphysema. No other nodules appreciated. No pleural effusion or thickening.  No pericardial effusion. No pathologic mediastinal adenopathy.  CT CHEST W/O 01/26/16 : Moderate to severe upper lobe predominant emphysematous changes. Saber-sheath trachea. No consolidation. Calcified nodules bilaterally. Upper lobe predominant nodules are essentially unchanged with largest measuring 1.2 x 0.5 cm and right upper lobe. Cavitary 0.9 cm peripheral right upper lobe nodule unchanged going back to 2016. No pathologic mediastinal adenopathy. No pleural effusion.  CT CHEST W/O 10/20/15: 1.1 x 0.5 cm spiculated nodule right upper lobe distorted by surrounding emphysema. Adjacent 9 mm nodule as well. No new developing pulmonary nodules. Calcified nodules again noted as well as small amount of calcification with mediastinal lymph nodes. Apical predominant emphysematous changes. No pleural effusion or thickening. No pericardial effusion. No pathologic mediastinal adenopathy.  CT CHEST W/O 01/19/15: No pleural effusion or thickening appreciated. Scattered bilateral subcentimeter groundglass pulmonary nodules. Additional calcified pulmonary nodules consistent with evidence of prior granulomatous disease given mediastinal lymph node with calcification & calcification within spleen. Radiologist commented on 1.2 x 1.2 irregular groundglass focus in the right upper lobe which is significantly distorted by severe upper lobe predominant emphysema. No pathologic mediastinal adenopathy. Radiologist commented that there are no bruits subcentimeter nodules that are new. Indeed these nodules were not present on my review of CT imaging from 2005 & 2006. The first time these were found was March 2016.  LABS 03/04/15 Alpha-1 antitrypsin:  MM (205) IgE:  21 RAST Panel:  Aspergillus Fumgitagus 0.36 (class 1) / Alternaria alternata 0.27 (class 0/1) / helminthosporium halodes 0.30 (class 0/1)  08/08/12 CBC: 6.5/15.0/42.5/171 BMP: 139/3.6/99/29/9/0.89/144/9.3          Assessment & Plan:   No diagnosis  found.  Discussion: Mr. Mincey is here to establish care with me for his severe COPD with recurrent exacerbations, chronic respiratory failure with hypoxemia, and centrilobular emphysema.  This has been a stable interval for him.  We talked about the importance of quitting smoking.  The only gaps in his care that I see is that he is not on a long-acting muscarinic agonist.  We will add that today.  I wish she could exercise more but is limited by the sciatic pain in his legs.  Plan: Severe COPD with recurrent exacerbations: Quit smoking Continue taking Perforomist and Pulmicort Start taking Spiriva daily Use albuterol as needed for chest tightness wheezing or shortness of breath You do not need to use the Atrovent regularly  Chronic respiratory failure with hypoxemia: Continue using 2 to 3 L of oxygen continuously  Tobacco abuse: Stop smoking Keep the plans for lung cancer screening in 2020  Leg pain: Dr. Arbie Cookey says that this is due to sciatica.  I would like for you to exercise more frequently.  Talk to your primary care doctor about what can be done about the sciatica pain  We will see you back in 3 to 4 months or sooner if needed  Greater than 50% of this 40-minute visit spent face-to-face    Current Outpatient Medications:  .  acetaminophen (TYLENOL) 325 MG tablet, Take 650 mg by mouth every 6 (six) hours as needed for mild pain., Disp: , Rfl:  .  albuterol (PROVENTIL HFA;VENTOLIN HFA) 108 (90 Base) MCG/ACT inhaler, Inhale 2 puffs into the lungs  every 6 (six) hours as needed for wheezing or shortness of breath., Disp: 1 Inhaler, Rfl: 6 .  apixaban (ELIQUIS) 5 MG TABS tablet, Take 1 tablet (5 mg total) 2 (two) times daily by mouth., Disp: 60 tablet, Rfl: 0 .  ATROVENT HFA 17 MCG/ACT inhaler, Take 2 puffs by mouth 4 (four) times daily., Disp: , Rfl: 5 .  budesonide (PULMICORT) 0.5 MG/2ML nebulizer solution, Take 2 mLs (0.5 mg total) by nebulization 2 (two) times daily., Disp: 60  mL, Rfl: 3 .  diltiazem (CARDIZEM CD) 360 MG 24 hr capsule, Take 1 capsule (360 mg total) daily by mouth., Disp: 60 capsule, Rfl: 0 .  feeding supplement, ENSURE ENLIVE, (ENSURE ENLIVE) LIQD, Take 237 mLs by mouth 3 (three) times daily between meals., Disp: 30 Bottle, Rfl: 0 .  fluticasone (FLONASE) 50 MCG/ACT nasal spray, Place 2 sprays into both nostrils daily as needed for allergies or rhinitis., Disp: 16 g, Rfl: 0 .  formoterol (PERFOROMIST) 20 MCG/2ML nebulizer solution, Take 2 mLs (20 mcg total) by nebulization 2 (two) times daily. DX code: J43.8, Disp: 120 mL, Rfl: 3 .  gabapentin (NEURONTIN) 100 MG capsule, Take 1 capsule (100 mg total) 2 (two) times daily by mouth., Disp: 60 capsule, Rfl: 0 .  guaiFENesin (MUCINEX) 600 MG 12 hr tablet, Take 1 tablet (600 mg total) by mouth 2 (two) times daily., Disp: 30 tablet, Rfl: 0 .  menthol-cetylpyridinium (CEPACOL) 3 MG lozenge, Take 1 lozenge (3 mg total) as needed by mouth for sore throat., Disp: 100 tablet, Rfl: 12 .  montelukast (SINGULAIR) 5 MG chewable tablet, CHEW 1 TABLET BY MOUTH AT BEDTIME, Disp: 30 tablet, Rfl: 3 .  nitroGLYCERIN (NITROSTAT) 0.4 MG SL tablet, Place 0.4 mg under the tongue every 5 (five) minutes as needed for chest pain. x3 doses as needed for chest pain, Disp: , Rfl:  .  pantoprazole (PROTONIX) 40 MG tablet, Take 1 tablet (40 mg total) by mouth daily., Disp: 30 tablet, Rfl: 0 .  pramipexole (MIRAPEX) 0.125 MG tablet, Take 1 tablet by mouth at bedtime., Disp: , Rfl:  .  rosuvastatin (CRESTOR) 20 MG tablet, Take 20 mg by mouth at bedtime., Disp: , Rfl:  .  sodium chloride (OCEAN) 0.65 % SOLN nasal spray, Place 1 spray as needed into both nostrils for congestion (nose irritation)., Disp: 2 Bottle, Rfl: 0 .  Spacer/Aero-Holding Chambers (AEROCHAMBER Z-STAT PLUS) inhaler, Use as instructed, Disp: 1 each, Rfl: 0 .  tamsulosin (FLOMAX) 0.4 MG CAPS capsule, Take 0.4 mg by mouth., Disp: , Rfl:  .  triamcinolone (NASACORT ALLERGY  24HR) 55 MCG/ACT AERO nasal inhaler, Place 2 sprays daily as needed into the nose (allergies). , Disp: , Rfl:

## 2017-11-27 NOTE — Patient Instructions (Signed)
Severe COPD with recurrent exacerbations: Quit smoking Continue taking Perforomist and Pulmicort Start taking Spiriva daily Use albuterol as needed for chest tightness wheezing or shortness of breath You do not need to use the Atrovent regularly  Chronic respiratory failure with hypoxemia: Continue using 2 to 3 L of oxygen continuously  Tobacco abuse: Stop smoking  Leg pain: Dr. Arbie CookeyEarly says that this is due to sciatica.  I would like for you to exercise more frequently.  Talk to your primary care doctor about what can be done about the sciatica pain  We will see you back in 3 to 4 months or sooner if needed

## 2017-11-29 ENCOUNTER — Ambulatory Visit (HOSPITAL_COMMUNITY): Payer: Medicare Other | Admitting: Hematology

## 2017-12-13 NOTE — Progress Notes (Signed)
Cardiology Office Note    Date:  12/14/2017   ID:  John Marsh, DOB 1945/11/15, MRN [id number]  PCP:  Eartha InchBadger, Michael C, MD  Cardiologist: Lesleigh NoeHenry W Vianny Schraeder III, MD   Chief Complaint  Patient presents with  . Atrial Fibrillation    History of Present Illness:  John Marsh is a 72 y.o. male has a history of COPD on oxygen (2 L rest, 3 w/exertion), AF previously on xarelto, CAD (remote stenting 2007 - further details unknown), tobaco abuse, pulmonary nodules by CT, DM, GERD, PAD (details unknown), & HTN.   He has had atrial fibrillation and atrial flutter in the past.  He has had bleeding on Xarelto including periorbital hematoma.  He comes in today without significant cardiac complaints.  He is taking apixaban 5 mg once per day because of the bleeding episode he had this year.  States that he was told to decrease apixaban to 5 mg once per day.  Denies chest pain.  Shortness of breath is stable.  He has gradually lost weight and is now weighing 146 pounds.  Neither he or his wife have very good memories concerning why anticoagulation therapy was decreased to 5 mg/day.  Says Dr. Fortunato CurlingBadger's PA told him to take 1 tablet a day after he developed bilateral total hematoma.   Past Medical History:  Diagnosis Date  . Acute on chronic respiratory failure with hypoxia (HCC) 09/19/2016  . Anemia   . Atrial fibrillation (HCC)    Chadsvasc2=3  . Chest pain 06/30/2016  . Chronic diastolic HF (heart failure) (HCC)   . Chronic respiratory failure with hypoxia (HCC) 07/03/2015  . COPD (chronic obstructive pulmonary disease) (HCC)   . COPD exacerbation (HCC) 05/07/2017  . COPD with acute exacerbation (HCC) 07/03/2015  . Coronary artery disease due to lipid rich plaque   . Coronary heart disease   . Dyspnea 07/15/2017  . Elevated troponin 09/19/2016  . Emphysema lung (HCC) 03/04/2015  . Emphysema of lung (HCC)   . Hyperlipidemia   . Hypertension   . Iron deficiency anemia   . MI (myocardial infarction)  (HCC)   . Multiple lung nodules on CT   . Nocturnal hypoxia   . Peripheral arterial disease (HCC)   . PVD (peripheral vascular disease) with claudication (HCC) 07/27/2015  . Respiratory failure (HCC) 05/02/2017  . Shortness of breath 06/03/2015  . Shortness of breath dyspnea   . Tachycardia 07/03/2015  . Typical atrial flutter [facility](HCC)     Past Surgical History:  Procedure Laterality Date  . CHOLECYSTECTOMY    . COLONOSCOPY WITH PROPOFOL N/A 07/19/2017   Procedure: COLONOSCOPY WITH PROPOFOL;  Surgeon: Rachael FeeJacobs, Daniel P, MD;  Location: [facility];  Service: Endoscopy;  Laterality: N/A;  . ESOPHAGOGASTRODUODENOSCOPY (EGD) WITH PROPOFOL N/A 07/19/2017   Procedure: ESOPHAGOGASTRODUODENOSCOPY (EGD) WITH PROPOFOL;  Surgeon: Rachael FeeJacobs, Daniel P, MD;  Location: [facility];  Service: Endoscopy;  Laterality: N/A;  . NASAL SEPTUM SURGERY    . PERIPHERAL VASCULAR CATHETERIZATION N/A 07/27/2015   Procedure: Abdominal Aortogram;  Surgeon: Chuck Hinthristopher S Dickson, MD;  [facility];  Service: Cardiovascular;  Laterality: N/A;  . PERIPHERAL VASCULAR CATHETERIZATION Bilateral 07/27/2015   Procedure: Lower Extremity Angiography;  Surgeon: Chuck Hinthristopher S Dickson, MD;  [facility];  Service: Cardiovascular;  Laterality: Bilateral;  . stent placed      Current Medications: Outpatient Medications Prior to Visit  Medication Sig Dispense Refill  . acetaminophen (TYLENOL) 325 MG tablet Take 650 mg by  mouth every 6 (six) hours as needed for mild pain.    Marland Kitchen albuterol (PROVENTIL HFA;VENTOLIN HFA) 108 (90 Base) MCG/ACT inhaler Inhale 2 puffs into the lungs every 6 (six) hours as needed for wheezing or shortness of breath. 1 Inhaler 6  . ATROVENT HFA 17 MCG/ACT inhaler Take 2 puffs by mouth 4 (four) times daily.  5  . budesonide (PULMICORT) 0.5 MG/2ML nebulizer solution Take 2 mLs (0.5 mg total) by nebulization 2 (two) times daily. 60 mL 3  . diltiazem (CARDIZEM CD) 360 MG 24 hr capsule Take 1  capsule (360 mg total) daily by mouth. 60 capsule 0  . feeding supplement, ENSURE ENLIVE, (ENSURE ENLIVE) LIQD Take 237 mLs by mouth 3 (three) times daily between meals. 30 Bottle 0  . fluticasone (FLONASE) 50 MCG/ACT nasal spray Place 2 sprays into both nostrils daily as needed for allergies or rhinitis. 16 g 0  . formoterol (PERFOROMIST) 20 MCG/2ML nebulizer solution Take 2 mLs (20 mcg total) by nebulization 2 (two) times daily. DX code: J43.8 120 mL 3  . gabapentin (NEURONTIN) 100 MG capsule Take 1 capsule (100 mg total) 2 (two) times daily by mouth. 60 capsule 0  . guaiFENesin (MUCINEX) 600 MG 12 hr tablet Take 1 tablet (600 mg total) by mouth 2 (two) times daily. 30 tablet 0  . menthol-cetylpyridinium (CEPACOL) 3 MG lozenge Take 1 lozenge (3 mg total) as needed by mouth for sore throat. 100 tablet 12  . montelukast (SINGULAIR) 5 MG chewable tablet CHEW 1 TABLET BY MOUTH AT BEDTIME 30 tablet 3  . nitroGLYCERIN (NITROSTAT) 0.4 MG SL tablet Place 0.4 mg under the tongue every 5 (five) minutes as needed for chest pain. x3 doses as needed for chest pain    . pantoprazole (PROTONIX) 40 MG tablet Take 1 tablet (40 mg total) by mouth daily. 30 tablet 0  . pramipexole (MIRAPEX) 0.125 MG tablet Take 1 tablet by mouth at bedtime.    . rosuvastatin (CRESTOR) 20 MG tablet Take 20 mg by mouth at bedtime.    . sodium chloride (OCEAN) 0.65 % SOLN nasal spray Place 1 spray as needed into both nostrils for congestion (nose irritation). 2 Bottle 0  . Spacer/Aero-Holding Chambers (AEROCHAMBER Z-STAT PLUS) inhaler Use as instructed 1 each 0  . tamsulosin (FLOMAX) 0.4 MG CAPS capsule Take 0.4 mg by mouth.    . Tiotropium Bromide Monohydrate (SPIRIVA RESPIMAT) 2.5 MCG/ACT AERS Inhale 2 puffs into the lungs daily. 1 Inhaler 5  . triamcinolone (NASACORT ALLERGY 24HR) 55 MCG/ACT AERO nasal inhaler Place 2 sprays daily as needed into the nose (allergies).     Marland Kitchen apixaban (ELIQUIS) 5 MG TABS tablet Take 1 tablet (5 mg  total) 2 (two) times daily by mouth. 60 tablet 0   No facility-administered medications prior to visit.      Allergies:   Patient has no known allergies.   Social History   Socioeconomic History  . Marital status: Married    Spouse name: Not on file  . Number of children: Not on file  . Years of education: Not on file  . Highest education level: Not on file  Occupational History  . Occupation: retired  Engineer, production  . Financial resource strain: Not on file  . Food insecurity:    Worry: Not on file    Inability: Not on file  . Transportation needs:    Medical: Not on file    Non-medical: Not on file  Tobacco Use  . Smoking status:  Current Every Day Smoker    Packs/day: 0.25    Years: 54.00    Pack years: 13.50    Types: Cigarettes    Start date: 08/01/1959  . Smokeless tobacco: Never Used  . Tobacco comment: smokes 4-5 cigerettes/day  Substance and Sexual Activity  . Alcohol use: No    Alcohol/week: 0.0 oz  . Drug use: No  . Sexual activity: Never  Lifestyle  . Physical activity:    Days per week: Not on file    Minutes per session: Not on file  . Stress: Not on file  Relationships  . Social connections:    Talks on phone: Not on file    Gets together: Not on file    Attends religious service: Not on file    Active member of club or organization: Not on file    Attends meetings of clubs or organizations: Not on file    Relationship status: Not on file  Other Topics Concern  . Not on file  Social History Narrative   Originally from [place]. Previously has lived in [place]. He moved to [facility] in [place]. He serve in [place]. 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 He has been to [place], [place], [place], several countries in [place], countries in [place], [place], & multiple [place]. 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 He has a dog, 7 cats, & 2 horses. Remote exposure to a parrot for  a few years in a different home. No mold exposure. No hot tub exposure. 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      Family History:  The patient's family history includes Heart disease in his father, mother, paternal grandfather, and sister.   ROS:   Please see the history of present illness.    Weight loss, chronic oxygen use, waking up at night short of breath, occasionally irregular heartbeats, excessive fatigue, All other systems reviewed and are negative.   PHYSICAL EXAM:   VS:  BP 130/60   Pulse 67   Ht 6' (1.829 m)   Wt 148 lb 9.6 oz (67.4 kg)   BMI 20.15 kg/m    GEN: Well nourished, well developed, in no acute distress  HEENT: normal  Neck: no JVD, carotid bruits, or masses Cardiac: RRR; no murmurs, rubs, or gallops,no edema  Respiratory:  clear to auscultation bilaterally, normal work of breathing GI: soft, nontender, nondistended, + BS MS: no deformity or atrophy  Skin: warm and dry, no rash Neuro:  Alert and Oriented x 3, Strength and sensation are intact Psych: euthymic mood, full affect  Wt Readings from Last 3 Encounters:  12/14/17 148 lb 9.6 oz (67.4 kg)  11/27/17 149 lb (67.6 kg)  10/24/17 153 lb 4 oz (69.5 kg)      Studies/Labs Reviewed:   EKG:  EKG  Not repeated  Recent Labs: 07/15/2017: ALT 13 07/16/2017: B Natriuretic Peptide 239.6 07/17/2017: Magnesium 1.9 09/05/2017: TSH 1.391 10/03/2017: BUN 13; Creatinine, Ser 0.91; Potassium 3.8; Sodium 139 11/23/2017: Hemoglobin 11.6; Platelets 198   Lipid Panel    Component Value Date/Time   CHOL 119 07/15/2017 0505   TRIG 39 07/15/2017 0505   HDL 52 07/15/2017 0505   CHOLHDL 2.3 07/15/2017 0505   VLDL 8 07/15/2017 0505   LDLCALC 59 07/15/2017 0505    Additional studies/ records that were reviewed today include:  No new clinical data  ASSESSMENT:    1. Typical atrial flutter (HCC)   2. PVD (peripheral vascular disease) with claudication (HCC)   3. TOBACCO  DEPENDENCE   4. Mixed hyperlipidemia   5. Essential hypertension   6. COPD exacerbation (HCC)   7. Coronary artery disease involving native coronary artery of native heart without angina pectoris      PLAN:  In order of problems listed above:  1. He has been having bleeding issues on 5 mg twice daily of apixaban.  Right now he is taking 5 mg once a day.  Will change to 2.5 mg twice daily, review all of his records to determine why his dose was decreased.  He explains that he had a recent episode of periorbital spontaneous hemorrhage.  He is also had GI bleeding and need for transfusion. 2. Not discussed 3. Encourage discontinuation 4. Not discussed 5. Adequate blood pressure control with target 130/80 mmHg 6. A significant and probably most concerning comorbidity.  On chronic O2 therapy. 7. Asymptomatic  There is some confusion with reference to bleeding risk versus ischemic/embolic protection with anticoagulation therapy.  Somewhat is decreased apixaban to 5 mg once per day.  For the time being we will give 2.5 mg twice daily.  I will investigate the recommendation to decrease anticoagulation intensity.  He will return in 1 month at which time the dose may be adjusted back to an appropriate dose unless there is good reason to use a less than effective regimen.  Addendum:, He has not been instructed by any physician to decrease apixaban to 5 mg/day.  His dose should be 5 mg twice daily.  I decreased the dose to 2.5 mg twice daily today but there is no basis for doing this.  We will call him and instruct that he go back to the standard dose.  He should notify us if bleeding.  He should return to [place] if questions but anticoagulation is long-term assuming he can take the medication without life-threatening blood loss.  Medication Adjustments/Labs and Tests Ordered: Current medicines are reviewed at length with the patient today.  Concerns regarding medicines are outlined above.  Medication  changes, Labs and Tests ordered today are listed in the Patient Instructions below. Patient Instructions  Medication Instructions:  Your physician has recommended you make the following change in your medication:   1) STOP taking Eliquis 5 mg.  2) START taking Eliquis 2.5 mg by mouth twice (2) daily.  (A prescription has been sent to your pharmacy)  Labwork: None   Testing/Procedures: None   Follow-Up: Your physician recommends that you schedule a follow-up appointment in: 1 month with Dr. Katrinka Blazing. (Can have 7/23 AT 1:40PM)   Any Other Special Instructions Will Be Listed Below (If Applicable).     If you need a refill on your cardiac medications before your next appointment, please call your pharmacy.      Signed, Lesleigh Noe, MD  12/14/2017 11:16 AM    [facility] [address] [place], [place], [place]  [address] Phone: [phone]; Fax: [phone]

## 2017-12-14 ENCOUNTER — Encounter: Payer: Self-pay | Admitting: Interventional Cardiology

## 2017-12-14 ENCOUNTER — Ambulatory Visit (INDEPENDENT_AMBULATORY_CARE_PROVIDER_SITE_OTHER): Payer: Medicare Other | Admitting: Interventional Cardiology

## 2017-12-14 VITALS — BP 130/60 | HR 67 | Ht 72.0 in | Wt 148.6 lb

## 2017-12-14 DIAGNOSIS — E782 Mixed hyperlipidemia: Secondary | ICD-10-CM | POA: Diagnosis not present

## 2017-12-14 DIAGNOSIS — I739 Peripheral vascular disease, unspecified: Secondary | ICD-10-CM

## 2017-12-14 DIAGNOSIS — I10 Essential (primary) hypertension: Secondary | ICD-10-CM

## 2017-12-14 DIAGNOSIS — J441 Chronic obstructive pulmonary disease with (acute) exacerbation: Secondary | ICD-10-CM | POA: Diagnosis not present

## 2017-12-14 DIAGNOSIS — I2510 Atherosclerotic heart disease of native coronary artery without angina pectoris: Secondary | ICD-10-CM | POA: Diagnosis not present

## 2017-12-14 DIAGNOSIS — I259 Chronic ischemic heart disease, unspecified: Secondary | ICD-10-CM | POA: Diagnosis not present

## 2017-12-14 DIAGNOSIS — F17200 Nicotine dependence, unspecified, uncomplicated: Secondary | ICD-10-CM

## 2017-12-14 DIAGNOSIS — I483 Typical atrial flutter: Secondary | ICD-10-CM | POA: Diagnosis not present

## 2017-12-14 MED ORDER — APIXABAN 5 MG PO TABS: 5.0000 mg | ORAL_TABLET | Freq: Two times a day (BID) | ORAL | 3 refills | Status: DC

## 2017-12-14 MED ORDER — APIXABAN 2.5 MG PO TABS: 2.5000 mg | ORAL_TABLET | Freq: Two times a day (BID) | ORAL | 3 refills | Status: DC

## 2017-12-14 NOTE — Addendum Note (Signed)
Addended by: Julio SicksBOWERS, JENNIFER L on: 12/14/2017 12:53 PM   Modules accepted: Orders

## 2017-12-14 NOTE — Patient Instructions (Addendum)
Medication Instructions:  Your physician has recommended you make the following change in your medication:   1) START taking Eliquis 5mg  twice daily instead of once daily  Labwork: None   Testing/Procedures: None   Follow-Up: Your physician recommends that you schedule a follow-up appointment in: 1 month with Dr. Katrinka BlazingSmith. (Can have 7/23 AT 1:40PM)   Any Other Special Instructions Will Be Listed Below (If Applicable).     If you need a refill on your cardiac medications before your next appointment, please call your pharmacy.

## 2017-12-19 ENCOUNTER — Inpatient Hospital Stay (HOSPITAL_COMMUNITY): Payer: Medicare Other | Attending: Hematology | Admitting: Hematology

## 2017-12-19 ENCOUNTER — Encounter (HOSPITAL_COMMUNITY): Payer: Self-pay | Admitting: Hematology

## 2017-12-19 VITALS — BP 141/57 | HR 68 | Temp 97.7°F | Resp 22 | Wt 150.6 lb

## 2017-12-19 DIAGNOSIS — D509 Iron deficiency anemia, unspecified: Secondary | ICD-10-CM | POA: Insufficient documentation

## 2017-12-19 DIAGNOSIS — Z7901 Long term (current) use of anticoagulants: Secondary | ICD-10-CM | POA: Diagnosis not present

## 2017-12-19 DIAGNOSIS — I4891 Unspecified atrial fibrillation: Secondary | ICD-10-CM | POA: Insufficient documentation

## 2017-12-19 NOTE — Progress Notes (Signed)
[facility] [address], [place] [address]   CLINIC:  Medical Oncology/Hematology  PCP:  Eartha Inch, MD [address] [place] [address] [phone]   REASON FOR VISIT:  Follow-up for Anemia  CURRENT THERAPY: Oral iron therapy since March   INTERVAL HISTORY:  [name] 72 y.o. male returns for routine follow-up for his anemia. Patient has dropped off 3 Occult stool cards which all came back positive for blood. Patient's stool is dark almost black. Patient states he is on Plavix and Eliquis. Patient is brought here by his wife. He is on continues home oxygen. Does bruise easy and his arms and back itches. Patient denies nausea, vomiting.  But has occasional constipation alternating with diarrhea when he takes iron.  Denies any new pain. Patient has an apt with is Cardiologist Dr. Katrinka Blazing on 7/23. Overall he has been feeling tired and weak.      REVIEW OF SYSTEMS:  Review of Systems  Constitutional: Positive for fatigue.  Respiratory: Positive for shortness of breath (home 02).   Gastrointestinal: Positive for blood in stool (dark stool).  Skin: Positive for itching and rash.  Neurological: Positive for extremity weakness.  Hematological: Bruises/bleeds easily.  All other systems reviewed and are negative.    PAST MEDICAL/SURGICAL HISTORY:  Past Medical History:  Diagnosis Date  . Acute on chronic respiratory failure with hypoxia (HCC) 09/19/2016  . Anemia   . Atrial fibrillation (HCC)    Chadsvasc2=3  . Chest pain 06/30/2016  . Chronic diastolic HF (heart failure) (HCC)   . Chronic respiratory failure with hypoxia (HCC) 07/03/2015  . COPD (chronic obstructive pulmonary disease) (HCC)   . COPD exacerbation (HCC) 05/07/2017  . COPD with acute exacerbation (HCC) 07/03/2015  . Coronary artery disease due to lipid rich plaque   . Coronary heart disease   . Dyspnea 07/15/2017  . Elevated troponin 09/19/2016  . Emphysema lung (HCC) 03/04/2015    . Emphysema of lung (HCC)   . Hyperlipidemia   . Hypertension   . Iron deficiency anemia   . MI (myocardial infarction) (HCC)   . Multiple lung nodules on CT   . Nocturnal hypoxia   . Peripheral arterial disease (HCC)   . PVD (peripheral vascular disease) with claudication (HCC) 07/27/2015  . Respiratory failure (HCC) 05/02/2017  . Shortness of breath 06/03/2015  . Shortness of breath dyspnea   . Tachycardia 07/03/2015  . Typical atrial flutter [facility])    Past Surgical History:  Procedure Laterality Date  . CHOLECYSTECTOMY    . COLONOSCOPY WITH PROPOFOL N/A 07/19/2017   Procedure: COLONOSCOPY WITH PROPOFOL;  Surgeon: Rachael Fee, MD;  Location: [facility] ENDOSCOPY;  Service: Endoscopy;  Laterality: N/A;  . ESOPHAGOGASTRODUODENOSCOPY (EGD) WITH PROPOFOL N/A 07/19/2017   Procedure: ESOPHAGOGASTRODUODENOSCOPY (EGD) WITH PROPOFOL;  Surgeon: Rachael Fee, MD;  Location: [facility] ENDOSCOPY;  Service: Endoscopy;  Laterality: N/A;  . NASAL SEPTUM SURGERY    . PERIPHERAL VASCULAR CATHETERIZATION N/A 07/27/2015   Procedure: Abdominal Aortogram;  Surgeon: Chuck Hint, MD;  Location: [facility] INVASIVE CV LAB;  Service: Cardiovascular;  Laterality: N/A;  . PERIPHERAL VASCULAR CATHETERIZATION Bilateral 07/27/2015   Procedure: Lower Extremity Angiography;  Surgeon: Chuck Hint, MD;  Location: [facility] INVASIVE CV LAB;  Service: Cardiovascular;  Laterality: Bilateral;  . stent placed       SOCIAL HISTORY:  Social History   Socioeconomic History  . Marital status: Married    Spouse name: Not on file  .  Number of children: Not on file  . Years of education: Not on file  . Highest education level: Not on file  Occupational History  . Occupation: retired  Engineer, production  . Financial resource strain: Not on file  . Food insecurity:    Worry: Not on file    Inability: Not on file  . Transportation needs:    Medical: Not on file    Non-medical: Not on file  Tobacco Use  . Smoking status: Current  Every Day Smoker    Packs/day: 0.25    Years: 54.00    Pack years: 13.50    Types: Cigarettes    Start date: 08/01/1959  . Smokeless tobacco: Never Used  . Tobacco comment: smokes 4-5 cigerettes/day  Substance and Sexual Activity  . Alcohol use: No    Alcohol/week: 0.0 oz  . Drug use: No  . Sexual activity: Never  Lifestyle  . Physical activity:    Days per week: Not on file    Minutes per session: Not on file  . Stress: Not on file  Relationships  . Social connections:    Talks on phone: Not on file    Gets together: Not on file    Attends religious service: Not on file    Active member of club or organization: Not on file    Attends meetings of clubs or organizations: Not on file    Relationship status: Not on file  . Intimate partner violence:    Fear of current or ex partner: Not on file    Emotionally abused: Not on file    Physically abused: Not on file    Forced sexual activity: Not on file  Other Topics Concern  . Not on file  Social History Narrative   Originally from [place]. Previously has lived in [place]. He moved to [facility] in [place]. He serve in [place]. 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 He has been to [place], [place], [place], several countries in [place], countries in [place], [place], & multiple [place] Countries. 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 He has a dog, 7 cats, & 2 horses. Remote exposure to a parrot for a few years in a different home. No mold exposure. No hot tub exposure. 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     FAMILY HISTORY:  Family History  Problem Relation Age of Onset  . Heart disease Mother        died at 90  . Heart disease Father   . Heart disease Sister        from rheumatic fever as a child  . Heart disease Paternal Grandfather     CURRENT MEDICATIONS:  Outpatient Encounter Medications as of 12/19/2017  Medication Sig   . acetaminophen (TYLENOL) 325 MG tablet Take 650 mg by mouth every 6 (six) hours as needed for mild pain.  Marland Kitchen albuterol (PROVENTIL HFA;VENTOLIN HFA) 108 (90 Base) MCG/ACT inhaler Inhale 2 puffs into the lungs every 6 (six) hours as needed for wheezing or shortness of breath.  Marland Kitchen apixaban (ELIQUIS) 5 MG TABS tablet Take 1 tablet (5 mg total) by mouth 2 (two) times daily.  . ATROVENT HFA 17 MCG/ACT inhaler Take 2 puffs by mouth 4 (four) times daily.  . budesonide (PULMICORT) 0.5 MG/2ML nebulizer solution Take 2 mLs (0.5 mg total) by nebulization 2 (two) times daily.  Marland Kitchen diltiazem (CARDIZEM  CD) 360 MG 24 hr capsule Take 1 capsule (360 mg total) daily by mouth.  . feeding supplement, ENSURE ENLIVE, (ENSURE ENLIVE) LIQD Take 237 mLs by mouth 3 (three) times daily between meals.  . fluticasone (FLONASE) 50 MCG/ACT nasal spray Place 2 sprays into both nostrils daily as needed for allergies or rhinitis.  . formoterol (PERFOROMIST) 20 MCG/2ML nebulizer solution Take 2 mLs (20 mcg total) by nebulization 2 (two) times daily. DX code: J43.8  . gabapentin (NEURONTIN) 100 MG capsule Take 1 capsule (100 mg total) 2 (two) times daily by mouth.  Marland Kitchen. guaiFENesin (MUCINEX) 600 MG 12 hr tablet Take 1 tablet (600 mg total) by mouth 2 (two) times daily.  Marland Kitchen. menthol-cetylpyridinium (CEPACOL) 3 MG lozenge Take 1 lozenge (3 mg total) as needed by mouth for sore throat.  . montelukast (SINGULAIR) 5 MG chewable tablet CHEW 1 TABLET BY MOUTH AT BEDTIME  . nitroGLYCERIN (NITROSTAT) 0.4 MG SL tablet Place 0.4 mg under the tongue every 5 (five) minutes as needed for chest pain. x3 doses as needed for chest pain  . pantoprazole (PROTONIX) 40 MG tablet Take 1 tablet (40 mg total) by mouth daily.  . pramipexole (MIRAPEX) 0.125 MG tablet Take 1 tablet by mouth at bedtime.  . rosuvastatin (CRESTOR) 20 MG tablet Take 20 mg by mouth at bedtime.  . sodium chloride (OCEAN) 0.65 % SOLN nasal spray Place 1 spray as needed into both nostrils for  congestion (nose irritation).  . Spacer/Aero-Holding Chambers (AEROCHAMBER Z-STAT PLUS) inhaler Use as instructed  . tamsulosin (FLOMAX) 0.4 MG CAPS capsule Take 0.4 mg by mouth.  . Tiotropium Bromide Monohydrate (SPIRIVA RESPIMAT) 2.5 MCG/ACT AERS Inhale 2 puffs into the lungs daily.  Marland Kitchen. triamcinolone (NASACORT ALLERGY 24HR) 55 MCG/ACT AERO nasal inhaler Place 2 sprays daily as needed into the nose (allergies).    No facility-administered encounter medications on file as of 12/19/2017.     ALLERGIES:  No Known Allergies   PHYSICAL EXAM:  ECOG Performance status: 1  Vitals:   12/19/17 1111  BP: (!) 141/57  Pulse: 68  Resp: (!) 22  Temp: 97.7 F (36.5 C)  SpO2: 100%   Filed Weights   12/19/17 1111  Weight: 150 lb 9.6 oz (68.3 kg)    Physical Exam   LABORATORY DATA:  I have reviewed the labs as listed.  CBC    Component Value Date/Time   WBC 5.0 11/23/2017 1320   RBC 4.46 11/23/2017 1320   HGB 11.6 (L) 11/23/2017 1320   HGB 8.1 (L) 08/22/2017 1417   HCT 38.0 (L) 11/23/2017 1320   HCT 26.6 (L) 08/22/2017 1417   PLT 198 11/23/2017 1320   PLT 261 08/22/2017 1417   MCV 85.2 11/23/2017 1320   MCV 72 (L) 08/22/2017 1417   MCH 26.0 11/23/2017 1320   MCHC 30.5 11/23/2017 1320   RDW 18.3 (H) 11/23/2017 1320   RDW 16.6 (H) 08/22/2017 1417   LYMPHSABS 1.1 11/23/2017 1320   MONOABS 0.4 11/23/2017 1320   EOSABS 0.1 11/23/2017 1320   BASOSABS 0.1 11/23/2017 1320   CMP Latest Ref Rng & Units 10/03/2017 08/15/2017 07/19/2017  Glucose 65 - 99 mg/dL 161(W102(H) 960(A103(H) 540(J114(H)  BUN 6 - 20 mg/dL 13 11 81(X24(H)  Creatinine 0.61 - 1.24 mg/dL 9.140.91 7.820.81 9.560.84  Sodium 135 - 145 mmol/L 139 143 139  Potassium 3.5 - 5.1 mmol/L 3.8 5.1 3.2(L)  Chloride 101 - 111 mmol/L 104 104 105  CO2 22 - 32 mmol/L 23 25 27  Calcium 8.9 - 10.3 mg/dL 8.9 9.2 1.4(N)  Total Protein 6.5 - 8.1 g/dL - - -  Total Bilirubin 0.3 - 1.2 mg/dL - - -  Alkaline Phos 38 - 126 U/L - - -  AST 15 - 41 U/L - - -  ALT 17 - 63  U/L - - -         ASSESSMENT & PLAN:   Iron deficiency anemia 1.  Severe iron deficiency anemia: - Colonoscopy and EGD on 07/19/2017 revealed no source of bleeding. - Stool positive for occult blood on 10/16/2017. -He started taking iron pill once daily around 20 March.  Iron pill causes constipation alternating with diarrhea.  Patient does report occasional black stools.  He is currently on Eliquis for A. fib and Plavix for his coronary stents. - His ferritin today is 14 and percent saturation is 47.  Hemoglobin is 11.6 with an MCV of 85.  He remains short of breath on exertion and is actively losing blood.  Hence I have recommended parenteral iron therapy with Feraheme weekly times two 1 week apart.  We discussed the side effects of Feraheme in detail including serious anaphylactic reactions.  We will schedule him starting next week.  We will see him back in 2 to 3 months with repeat iron panel, ferritin and CBC. - He has an appointment to see Dr. Katrinka Blazing who is his cardiologist.  He will find out how long he needs to be on Plavix. -He is having some problems with his portable oxygen machine.  He was told to contact Dr. Fortunato Curling office.      Orders placed this encounter:  Orders Placed This Encounter  Procedures  . CBC with Differential/Platelet  . Comprehensive metabolic panel  . Ferritin  . Iron and TIBC      Doreatha Massed, MD [facility] [phone]

## 2017-12-19 NOTE — Assessment & Plan Note (Signed)
1.  Severe iron deficiency anemia: - Colonoscopy and EGD on 07/19/2017 revealed no source of bleeding. - Stool positive for occult blood on 10/16/2017. -He started taking iron pill once daily around 20 March.  Iron pill causes constipation alternating with diarrhea.  Patient does report occasional black stools.  He is currently on Eliquis for A. fib and Plavix for his coronary stents. - His ferritin today is 14 and percent saturation is 47.  Hemoglobin is 11.6 with an MCV of 85.  He remains short of breath on exertion and is actively losing blood.  Hence I have recommended parenteral iron therapy with Feraheme weekly times two 1 week apart.  We discussed the side effects of Feraheme in detail including serious anaphylactic reactions.  We will schedule him starting next week.  We will see him back in 2 to 3 months with repeat iron panel, ferritin and CBC. - He has an appointment to see Dr. Katrinka BlazingSmith who is his cardiologist.  He will find out how long he needs to be on Plavix. -He is having some problems with his portable oxygen machine.  He was told to contact Dr. Fortunato CurlingBadger's office.

## 2017-12-19 NOTE — Patient Instructions (Signed)
[facility] at [facility] Discharge Instructions  You saw Dr. Katragadda today.   Thank you for choosing [facility] at [facility] to provide your oncology and hematology care.  To afford each patient quality time with our provider, please arrive at least 15 minutes before your scheduled appointment time.   If you have a lab appointment with the Cancer Center please come in thru the  Main Entrance and check in at the main information desk  You need to re-schedule your appointment should you arrive 10 or more minutes late.  We strive to give you quality time with our providers, and arriving late affects you and other patients whose appointments are after yours.  Also, if you no show three or more times for appointments you may be dismissed from the clinic at the providers discretion.     Again, thank you for choosing [facility].  Our hope is that these requests will decrease the amount of time that you wait before being seen by our physicians.       _____________________________________________________________  Should you have questions after your visit to [facility], please contact our office at [phone] between the hours of 8:30 a.m. and 4:30 p.m.  Voicemails left after 4:30 p.m. will not be returned until the following business day.  For prescription refill requests, have your pharmacy contact our office.       Resources For Cancer Patients and their Caregivers ? American Cancer Society: Can assist with transportation, wigs, general needs, runs Look Good Feel Better.        1-888-227-6333 ? Cancer Care: Provides financial assistance, online support groups, medication/co-pay assistance.  1-800-813-HOPE (4673) ? [facility] Assists [place] cancer patients and their families through emotional , educational and financial support.  336-427-4357 ? [place] DSS Where to apply for  food stamps, Medicaid and utility assistance. 336-342-1394 ? [facility]: Transportation to medical appointments. 336-347-2287 ? Social Security Administration: May apply for disability if have a Stage IV cancer. 336-342-7796 1-800-772-1213 ? [place] Aging, Disability and Transit Services: Assists with nutrition, care and transit needs. 336-349-2343  Cancer Center Support Programs:   > Cancer Support Group  2nd Tuesday of the month 1pm-2pm, Journey Room   > Creative Journey  3rd Tuesday of the month 1130am-1pm, Journey Room     

## 2017-12-24 ENCOUNTER — Other Ambulatory Visit: Payer: Self-pay

## 2017-12-24 ENCOUNTER — Inpatient Hospital Stay (HOSPITAL_COMMUNITY): Payer: Medicare Other

## 2017-12-24 ENCOUNTER — Encounter (HOSPITAL_COMMUNITY): Payer: Self-pay

## 2017-12-24 VITALS — BP 154/47 | HR 68 | Temp 97.7°F | Resp 20

## 2017-12-24 DIAGNOSIS — D509 Iron deficiency anemia, unspecified: Secondary | ICD-10-CM

## 2017-12-24 MED ORDER — SODIUM CHLORIDE 0.9 % IV SOLN
510.0000 mg | Freq: Once | INTRAVENOUS | Status: AC
  Administered 2017-12-24: 510 mg via INTRAVENOUS
  Filled 2017-12-24: qty 17

## 2017-12-24 MED ORDER — SODIUM CHLORIDE 0.9 % IV SOLN
Freq: Once | INTRAVENOUS | Status: AC
  Administered 2017-12-24: 12:00:00 via INTRAVENOUS

## 2017-12-24 NOTE — Patient Instructions (Signed)
[facility] at [facility] Discharge Instruction Feraheme given today per orders Follow up as scheduled.   Thank you for choosing [facility] at [facility] to provide your oncology and hematology care.  To afford each patient quality time with our provider, please arrive at least 15 minutes before your scheduled appointment time.   If you have a lab appointment with the Cancer Center please come in thru the  Main Entrance and check in at the main information desk  You need to re-schedule your appointment should you arrive 10 or more minutes late.  We strive to give you quality time with our providers, and arriving late affects you and other patients whose appointments are after yours.  Also, if you no show three or more times for appointments you may be dismissed from the clinic at the providers discretion.     Again, thank you for choosing [facility].  Our hope is that these requests will decrease the amount of time that you wait before being seen by our physicians.       _____________________________________________________________  Should you have questions after your visit to [facility], please contact our office at [phone] between the hours of 8:30 a.m. and 4:30 p.m.  Voicemails left after 4:30 p.m. will not be returned until the following business day.  For prescription refill requests, have your pharmacy contact our office.       Resources For Cancer Patients and their Caregivers ? American Cancer Society: Can assist with transportation, wigs, general needs, runs Look Good Feel Better.        [phone] ? Cancer Care: Provides financial assistance, online support groups, medication/co-pay assistance.  1-800-813-HOPE 7340683104(4673) ? [facility] Assists [place] cancer patients and their families through emotional , educational and financial support.  ([phone] ? [place]  DSS Where to apply for food stamps, Medicaid and utility assistance. 512-083-5363936-748-5161 ? RCATS: Transportation to medical appointments. (484)425-07806695897042 ? Social Security Administration: May apply for disability if have a Stage IV cancer. (228)013-1963(720)724-8723 (254)470-78931-(509) 712-3246 ? [place] [facility], Disability and Transit Services: Assists with nutrition, care and transit needs. ([phone] 871-4970616-779-5215  Cancer Center Support Programs:   > Cancer Support Group  2nd Tuesday of the month 1pm-2pm, Journey Room   > Creative Journey  3rd Tuesday of the month 1130am-1pm, Journey Room

## 2017-12-24 NOTE — Progress Notes (Signed)
Patient tolerated iron infusion with no complaints voiced.  Peripheral IV site clean and dry with no bruising or swelling noted at site.  Denied pain at site.  Band aid applied.  VSs with discharge and left ambulatory with family.  No s/s of distress noted.

## 2017-12-31 ENCOUNTER — Ambulatory Visit (HOSPITAL_COMMUNITY): Payer: Medicare Other

## 2017-12-31 ENCOUNTER — Inpatient Hospital Stay (HOSPITAL_COMMUNITY): Payer: Medicare Other

## 2018-01-02 ENCOUNTER — Ambulatory Visit (HOSPITAL_COMMUNITY): Payer: Medicare Other

## 2018-01-06 NOTE — Progress Notes (Signed)
Cardiology Office Note:    Date:  01/08/2018   ID:  John Marsh, DOB 10-19-1945, MRN [id number]  PCP:  Eartha Inch, MD  Cardiologist:  Lesleigh Noe, MD   Referring MD: Eartha Inch, MD   Chief Complaint  Patient presents with  . Atrial Fibrillation    History of Present Illness:    John Marsh is a 72 y.o. male with a hx of COPD on oxygen (2 L rest, 3 w/exertion), AFon xarelto, CAD(remote stenting 2007 - further details unknown),tobaco abuse, pulmonary nodules by CT, DM,GERD,PAD(details unknown),&HTN.  He is doing about the same as when I saw him at last visit.  He is back on 5 mg twice daily of Eliquis.  He has had no significant bleeding issues.  He denies chest pain.  Has severe limiting dyspnea on exertion.  Also troubled by bilateral lower extremity leg discomfort with activity.  Has had relatively recent ABIs performed approximately 1 year ago.   Past Medical History:  Diagnosis Date  . Acute on chronic respiratory failure with hypoxia (HCC) 09/19/2016  . Anemia   . Atrial fibrillation (HCC)    Chadsvasc2=3  . Chest pain 06/30/2016  . Chronic diastolic HF (heart failure) (HCC)   . Chronic respiratory failure with hypoxia (HCC) 07/03/2015  . COPD (chronic obstructive pulmonary disease) (HCC)   . COPD exacerbation (HCC) 05/07/2017  . COPD with acute exacerbation (HCC) 07/03/2015  . Coronary artery disease due to lipid rich plaque   . Coronary heart disease   . Dyspnea 07/15/2017  . Elevated troponin 09/19/2016  . Emphysema lung (HCC) 03/04/2015  . Emphysema of lung (HCC)   . Hyperlipidemia   . Hypertension   . Iron deficiency anemia   . MI (myocardial infarction) (HCC)   . Multiple lung nodules on CT   . Nocturnal hypoxia   . Peripheral arterial disease (HCC)   . PVD (peripheral vascular disease) with claudication (HCC) 07/27/2015  . Respiratory failure (HCC) 05/02/2017  . Shortness of breath 06/03/2015  . Shortness of breath dyspnea   .  Tachycardia 07/03/2015  . Typical atrial flutter [facility])     Past Surgical History:  Procedure Laterality Date  . CHOLECYSTECTOMY    . COLONOSCOPY WITH PROPOFOL N/A 07/19/2017   Procedure: COLONOSCOPY WITH PROPOFOL;  Surgeon: Rachael Fee, MD;  Location: [facility];  Service: Endoscopy;  Laterality: N/A;  . ESOPHAGOGASTRODUODENOSCOPY (EGD) WITH PROPOFOL N/A 07/19/2017   Procedure: ESOPHAGOGASTRODUODENOSCOPY (EGD) WITH PROPOFOL;  Surgeon: Rachael Fee, MD;  Location: [facility];  Service: Endoscopy;  Laterality: N/A;  . NASAL SEPTUM SURGERY    . PERIPHERAL VASCULAR CATHETERIZATION N/A 07/27/2015   Procedure: Abdominal Aortogram;  Surgeon: Chuck Hint, MD;  Location: [facility] INVASIVE CV LAB;  Service: Cardiovascular;  Laterality: N/A;  . PERIPHERAL VASCULAR CATHETERIZATION Bilateral 07/27/2015   Procedure: Lower Extremity Angiography;  Surgeon: Chuck Hint, MD;  Location: [facility] INVASIVE CV LAB;  Service: Cardiovascular;  Laterality: Bilateral;  . stent placed      Current Medications: Current Meds  Medication Sig  . acetaminophen (TYLENOL) 325 MG tablet Take 650 mg by mouth every 6 (six) hours as needed for mild pain.  Marland Kitchen albuterol (PROVENTIL HFA;VENTOLIN HFA) 108 (90 Base) MCG/ACT inhaler Inhale 2 puffs into the lungs every 6 (six) hours as needed for wheezing or shortness of breath.  Marland Kitchen apixaban (ELIQUIS) 5 MG TABS tablet Take 1 tablet (5 mg total) by mouth 2 (two) times daily.  . ATROVENT  HFA 17 MCG/ACT inhaler Take 2 puffs by mouth 4 (four) times daily.  . budesonide (PULMICORT) 0.5 MG/2ML nebulizer solution Take 2 mLs (0.5 mg total) by nebulization 2 (two) times daily.  Marland Kitchen. diltiazem (CARDIZEM CD) 360 MG 24 hr capsule Take 1 capsule (360 mg total) daily by mouth.  . feeding supplement, ENSURE ENLIVE, (ENSURE ENLIVE) LIQD Take 237 mLs by mouth 3 (three) times daily between meals.  . fluticasone (FLONASE) 50 MCG/ACT nasal spray Place 2 sprays into both nostrils daily as  needed for allergies or rhinitis.  . formoterol (PERFOROMIST) 20 MCG/2ML nebulizer solution Take 2 mLs (20 mcg total) by nebulization 2 (two) times daily. DX code: J43.8  . gabapentin (NEURONTIN) 100 MG capsule Take 1 capsule (100 mg total) 2 (two) times daily by mouth.  Marland Kitchen. guaiFENesin (MUCINEX) 600 MG 12 hr tablet Take 1 tablet (600 mg total) by mouth 2 (two) times daily.  Marland Kitchen. menthol-cetylpyridinium (CEPACOL) 3 MG lozenge Take 1 lozenge (3 mg total) as needed by mouth for sore throat.  . montelukast (SINGULAIR) 5 MG chewable tablet CHEW 1 TABLET BY MOUTH AT BEDTIME  . nitroGLYCERIN (NITROSTAT) 0.4 MG SL tablet Place 0.4 mg under the tongue every 5 (five) minutes as needed for chest pain. x3 doses as needed for chest pain  . pantoprazole (PROTONIX) 40 MG tablet Take 1 tablet (40 mg total) by mouth daily.  . pramipexole (MIRAPEX) 0.125 MG tablet Take 1 tablet by mouth at bedtime.  . rosuvastatin (CRESTOR) 20 MG tablet Take 20 mg by mouth at bedtime.  . sodium chloride (OCEAN) 0.65 % SOLN nasal spray Place 1 spray as needed into both nostrils for congestion (nose irritation).  . Spacer/Aero-Holding Chambers (AEROCHAMBER Z-STAT PLUS) inhaler Use as instructed  . tamsulosin (FLOMAX) 0.4 MG CAPS capsule Take 0.4 mg by mouth.  . Tiotropium Bromide Monohydrate (SPIRIVA RESPIMAT) 2.5 MCG/ACT AERS Inhale 2 puffs into the lungs daily.  Marland Kitchen. triamcinolone (NASACORT ALLERGY 24HR) 55 MCG/ACT AERO nasal inhaler Place 2 sprays daily as needed into the nose (allergies).      Allergies:   Patient has no known allergies.   Social History   Socioeconomic History  . Marital status: Married    Spouse name: Not on file  . Number of children: Not on file  . Years of education: Not on file  . Highest education level: Not on file  Occupational History  . Occupation: retired  Engineer, productionocial Needs  . Financial resource strain: Not on file  . Food insecurity:    Worry: Not on file    Inability: Not on file  .  Transportation needs:    Medical: Not on file    Non-medical: Not on file  Tobacco Use  . Smoking status: Current Every Day Smoker    Packs/day: 0.25    Years: 54.00    Pack years: 13.50    Types: Cigarettes    Start date: 08/01/1959  . Smokeless tobacco: Never Used  . Tobacco comment: smokes 4-5 cigerettes/day  Substance and Sexual Activity  . Alcohol use: No    Alcohol/week: 0.0 oz  . Drug use: No  . Sexual activity: Never  Lifestyle  . Physical activity:    Days per week: Not on file    Minutes per session: Not on file  . Stress: Not on file  Relationships  . Social connections:    Talks on phone: Not on file    Gets together: Not on file    Attends religious service: Not  on file    Active member of club or organization: Not on file    Attends meetings of clubs or organizations: Not on file    Relationship status: Not on file  Other Topics Concern  . Not on file  Social History Narrative   Originally from [place]. Previously has lived in [place]. He moved to [facility] in [place]. He serve in [place]. 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 He has been to [place], [place], [place], several countries in [place], countries in [place], [place], & multiple [place] Count[place]. 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 He has a dog, 7 cats, & 2 horses. Remote exposure to a parrot for a few years in a different home. No mold exposure. No hot tub exposure. 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      Family History: The patient's family history includes Heart disease in his father, mother, paternal grandfather, and sister.  ROS:   Please see the history of present illness.    Leg pain, decreased exertional tolerance, depression concerning limitations.  Decreased memory.  Decreased appetite and concern about weight loss.  All other systems reviewed and are negative.  EKGs/Labs/Other Studies  Reviewed:    The following studies were reviewed today:  Lower extremity ABI/30/2019: Final Interpretation: Right: Resting right ankle-brachial index indicates moderate right lower extremity arterial disease. The right toe-brachial index is abnormal. Left: Resting left ankle-brachial index indicates moderate left lower extremity arterial disease. The left toe-brachial index is abnormal.    EKG:  EKG is not ordered today.    Recent Labs: 07/15/2017: ALT 13 07/16/2017: B Natriuretic Peptide 239.6 07/17/2017: Magnesium 1.9 09/05/2017: TSH 1.391 10/03/2017: BUN 13; Creatinine, Ser 0.91; Potassium 3.8; Sodium 139 11/23/2017: Hemoglobin 11.6; Platelets 198  Recent Lipid Panel    Component Value Date/Time   CHOL 119 07/15/2017 0505   TRIG 39 07/15/2017 0505   HDL 52 07/15/2017 0505   CHOLHDL 2.3 07/15/2017 0505   VLDL 8 07/15/2017 0505   LDLCALC 59 07/15/2017 0505    Physical Exam:    VS:  BP 138/64   Pulse 76   Ht 6' (1.829 m)   Wt 149 lb 3.2 oz (67.7 kg)   BMI 20.24 kg/m     Wt Readings from Last 3 Encounters:  01/08/18 149 lb 3.2 oz (67.7 kg)  12/19/17 150 lb 9.6 oz (68.3 kg)  12/14/17 148 lb 9.6 oz (67.4 kg)     GEN: Frail  in no acute distress HEENT: Normal NECK: No JVD. LYMPHATICS: No lymphadenopathy CARDIAC: RRR, no murmur, no gallop, no edema. VASCULAR: Absent pulses in both lower extremities.  No bruits. RESPIRATORY: Diminished breath sounds throughout both lung fields.  No wheezing. ABDOMEN: Soft, non-tender, non-distended, No pulsatile mass, MUSCULOSKELETAL: No deformity  SKIN: Warm and dry NEUROLOGIC:  Alert and oriented x 3 PSYCHIATRIC:  Normal affect   ASSESSMENT:    1. Coronary artery disease involving native coronary artery of native heart without angina pectoris   2. Chronic diastolic HF (heart failure) (HCC)   3. Diabetes mellitus without complication (HCC)   4. Other emphysema (HCC)   5. Essential hypertension   6. Mixed hyperlipidemia   7.  Typical atrial flutter (HCC)   8. Chronic anticoagulation    PLAN:    In order of problems listed above:  1. Stable without angina 2. No evidence of volume overload  3. Not discussed 4. Severe and is his major comorbidity/medical problem.  On chronic O2 therapy. 5. Blood pressure target 140/90 mmHg or less.  Low-salt diet discussed. 6. LDL target less than 70 7. Continue anticoagulation therapy to prevent embolic CVA.  Eliquis 5 mg twice daily. 8. Continue Eliquis  Clinical follow-up in 6 months with team member and me in 1 year.   Medication Adjustments/Labs and Tests Ordered: Current medicines are reviewed at length with the patient today.  Concerns regarding medicines are outlined above.  No orders of the defined types were placed in this encounter.  No orders of the defined types were placed in this encounter.   Patient Instructions  Medication Instructions:  Your physician recommends that you continue on your current medications as directed. Please refer to the Current Medication list given to you today.  Labwork: None  Testing/Procedures: None  Follow-Up: Your physician wants you to follow-up in: 6 months with Dr. Katrinka Blazing, NP or PA. You will receive a reminder letter in the mail two months in advance. If you don't receive a letter, please call our office to schedule the follow-up appointment.  If you need a refill on your cardiac medications before your next appointment, please call your pharmacy.      Signed, Lesleigh Noe, MD  01/08/2018 2:17 PM    [facility]

## 2018-01-07 ENCOUNTER — Encounter (HOSPITAL_COMMUNITY): Payer: Self-pay

## 2018-01-07 ENCOUNTER — Other Ambulatory Visit: Payer: Self-pay

## 2018-01-07 ENCOUNTER — Inpatient Hospital Stay (HOSPITAL_COMMUNITY): Payer: Medicare Other

## 2018-01-07 VITALS — BP 110/49 | HR 60 | Temp 98.0°F | Resp 18

## 2018-01-07 DIAGNOSIS — D509 Iron deficiency anemia, unspecified: Secondary | ICD-10-CM | POA: Diagnosis not present

## 2018-01-07 MED ORDER — SODIUM CHLORIDE 0.9 % IV SOLN
Freq: Once | INTRAVENOUS | Status: AC
  Administered 2018-01-07: 14:00:00 via INTRAVENOUS

## 2018-01-07 MED ORDER — FERUMOXYTOL INJECTION 510 MG/17 ML
510.0000 mg | Freq: Once | INTRAVENOUS | Status: AC
  Administered 2018-01-07: 510 mg via INTRAVENOUS
  Filled 2018-01-07: qty 17

## 2018-01-07 NOTE — Progress Notes (Signed)
Tolerated infusion w/o adverse reaction.  Alert, in no distress.  VSS.  Discharged via wheelchair in c/o spouse.  

## 2018-01-08 ENCOUNTER — Encounter: Payer: Self-pay | Admitting: Interventional Cardiology

## 2018-01-08 ENCOUNTER — Ambulatory Visit (INDEPENDENT_AMBULATORY_CARE_PROVIDER_SITE_OTHER): Payer: Medicare Other | Admitting: Interventional Cardiology

## 2018-01-08 VITALS — BP 138/64 | HR 76 | Ht 72.0 in | Wt 149.2 lb

## 2018-01-08 DIAGNOSIS — I5032 Chronic diastolic (congestive) heart failure: Secondary | ICD-10-CM | POA: Diagnosis not present

## 2018-01-08 DIAGNOSIS — I259 Chronic ischemic heart disease, unspecified: Secondary | ICD-10-CM | POA: Diagnosis not present

## 2018-01-08 DIAGNOSIS — I10 Essential (primary) hypertension: Secondary | ICD-10-CM | POA: Diagnosis not present

## 2018-01-08 DIAGNOSIS — E119 Type 2 diabetes mellitus without complications: Secondary | ICD-10-CM | POA: Diagnosis not present

## 2018-01-08 DIAGNOSIS — Z7901 Long term (current) use of anticoagulants: Secondary | ICD-10-CM

## 2018-01-08 DIAGNOSIS — I483 Typical atrial flutter: Secondary | ICD-10-CM

## 2018-01-08 DIAGNOSIS — E782 Mixed hyperlipidemia: Secondary | ICD-10-CM | POA: Diagnosis not present

## 2018-01-08 DIAGNOSIS — J438 Other emphysema: Secondary | ICD-10-CM | POA: Diagnosis not present

## 2018-01-08 DIAGNOSIS — I2510 Atherosclerotic heart disease of native coronary artery without angina pectoris: Secondary | ICD-10-CM

## 2018-01-08 NOTE — Patient Instructions (Signed)
Medication Instructions:  Your physician recommends that you continue on your current medications as directed. Please refer to the Current Medication list given to you today.  Labwork: None  Testing/Procedures: None  Follow-Up: Your physician wants you to follow-up in: 6 months with Dr. Katrinka BlazingSmith, NP or PA. You will receive a reminder letter in the mail two months in advance. If you don't receive a letter, please call our office to schedule the follow-up appointment.  If you need a refill on your cardiac medications before your next appointment, please call your pharmacy.

## 2018-01-18 ENCOUNTER — Ambulatory Visit (INDEPENDENT_AMBULATORY_CARE_PROVIDER_SITE_OTHER): Payer: Medicare Other | Admitting: Gastroenterology

## 2018-01-18 ENCOUNTER — Encounter: Payer: Self-pay | Admitting: Gastroenterology

## 2018-01-18 ENCOUNTER — Other Ambulatory Visit: Payer: Self-pay

## 2018-01-18 ENCOUNTER — Other Ambulatory Visit (INDEPENDENT_AMBULATORY_CARE_PROVIDER_SITE_OTHER): Payer: Medicare Other

## 2018-01-18 VITALS — BP 116/58 | HR 72 | Ht 72.0 in | Wt 148.2 lb

## 2018-01-18 DIAGNOSIS — D649 Anemia, unspecified: Secondary | ICD-10-CM | POA: Diagnosis not present

## 2018-01-18 DIAGNOSIS — I259 Chronic ischemic heart disease, unspecified: Secondary | ICD-10-CM | POA: Diagnosis not present

## 2018-01-18 LAB — CBC WITH DIFFERENTIAL/PLATELET
BASOS PCT: 1.1 % (ref 0.0–3.0)
Basophils Absolute: 0.1 10*3/uL (ref 0.0–0.1)
EOS ABS: 0.2 10*3/uL (ref 0.0–0.7)
Eosinophils Relative: 2.1 % (ref 0.0–5.0)
HEMATOCRIT: 38 % — AB (ref 39.0–52.0)
Hemoglobin: 13.1 g/dL (ref 13.0–17.0)
LYMPHS PCT: 17.2 % (ref 12.0–46.0)
Lymphs Abs: 1.3 10*3/uL (ref 0.7–4.0)
MCHC: 34.5 g/dL (ref 30.0–36.0)
MCV: 85.4 fl (ref 78.0–100.0)
MONO ABS: 0.5 10*3/uL (ref 0.1–1.0)
Monocytes Relative: 6.6 % (ref 3.0–12.0)
NEUTROS ABS: 5.4 10*3/uL (ref 1.4–7.7)
Neutrophils Relative %: 73 % (ref 43.0–77.0)
PLATELETS: 253 10*3/uL (ref 150.0–400.0)
RBC: 4.45 Mil/uL (ref 4.22–5.81)
RDW: 15.7 % — AB (ref 11.5–15.5)
WBC: 7.4 10*3/uL (ref 4.0–10.5)

## 2018-01-18 NOTE — Progress Notes (Signed)
Review of pertinent gastrointestinal problems: 1.  Significant iron deficiency anemia, hemoglobin around 7, microcytic.  On Eliquis.  Work-up while hospitalized January 2019 included colonoscopy that showed normal terminal ileum and classic appearing pseudomembranes from C. difficile, toxin positive as well.  Treated with vancomycin. EGD 06/2017 was normal, biopsied from duodenum were normal. CT 04/2017 abd,pelvis with IV and oral contrast no explanation for anemia. 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 Takes oral iron and gets iron infusions via hematology. 2. Rather incidental C. Diff, see above.   HPI: This is a very pleasant 72 year old man whom I last saw 2 or 3 months ago in the office.  He has chronic iron deficiency anemia.  I am suspicious that he has intermittently bleeding small bowel AVMs although that has never been clearly documented.  This is in the setting of chronic blood thinner and severe COPD.  He is on chronic oxygen and gets winded even walking up a flight of stairs despite the oxygen. He takes iron orally twice daily and has no GI symptoms from it except some dark stools. He has been given 2 iron infusions in the past few months.  He does not know if he is scheduled for anymore.  These are being done by hematology.  He has no abdominal pains.    Chief complaint is iron deficiency anemia  ROS: complete GI ROS as described in HPI, all other review negative.  Constitutional:  No unintentional weight loss   Past Medical History:  Diagnosis Date  . Acute on chronic respiratory failure with hypoxia (HCC) 09/19/2016  . Anemia   . Atrial fibrillation (HCC)    Chadsvasc2=3  . Chest pain 06/30/2016  . Chronic diastolic HF (heart failure) (HCC)   . Chronic respiratory failure with hypoxia (HCC) 07/03/2015  . COPD (chronic obstructive pulmonary disease) (HCC)   . COPD exacerbation (HCC) 05/07/2017  . COPD with acute exacerbation (HCC)  07/03/2015  . Coronary artery disease due to lipid rich plaque   . Coronary heart disease   . Dyspnea 07/15/2017  . Elevated troponin 09/19/2016  . Emphysema lung (HCC) 03/04/2015  . Emphysema of lung (HCC)   . Hyperlipidemia   . Hypertension   . Iron deficiency anemia   . MI (myocardial infarction) (HCC)   . Multiple lung nodules on CT   . Nocturnal hypoxia   . Peripheral arterial disease (HCC)   . PVD (peripheral vascular disease) with claudication (HCC) 07/27/2015  . Respiratory failure (HCC) 05/02/2017  . Shortness of breath 06/03/2015  . Shortness of breath dyspnea   . Tachycardia 07/03/2015  . Typical atrial flutter [facility])     Past Surgical History:  Procedure Laterality Date  . CHOLECYSTECTOMY    . COLONOSCOPY WITH PROPOFOL N/A 07/19/2017   Procedure: COLONOSCOPY WITH PROPOFOL;  Surgeon: Rachael Fee, MD;  Location: [facility];  Service: Endoscopy;  Laterality: N/A;  . ESOPHAGOGASTRODUODENOSCOPY (EGD) WITH PROPOFOL N/A 07/19/2017   Procedure: ESOPHAGOGASTRODUODENOSCOPY (EGD) WITH PROPOFOL;  Surgeon: Rachael Fee, MD;  Location: [facility];  Service: Endoscopy;  Laterality: N/A;  . NASAL SEPTUM SURGERY    . PERIPHERAL VASCULAR CATHETERIZATION N/A 07/27/2015   Procedure: Abdominal Aortogram;  Surgeon: Chuck Hint, MD;  Location: [facility] INVASIVE CV LAB;  Service: Cardiovascular;  Laterality: N/A;  . PERIPHERAL VASCULAR CATHETERIZATION Bilateral 07/27/2015   Procedure: Lower Extremity Angiography;  Surgeon: Chuck Hint, MD;  Location: [facility] INVASIVE CV LAB;  Service: Cardiovascular;  Laterality: Bilateral;  .  stent placed      Current Outpatient Medications  Medication Sig Dispense Refill  . acetaminophen (TYLENOL) 325 MG tablet Take 650 mg by mouth every 6 (six) hours as needed for mild pain.    Marland Kitchen albuterol (PROVENTIL HFA;VENTOLIN HFA) 108 (90 Base) MCG/ACT inhaler Inhale 2 puffs into the lungs every 6 (six) hours as needed for wheezing or shortness of breath. 1  Inhaler 6  . apixaban (ELIQUIS) 5 MG TABS tablet Take 1 tablet (5 mg total) by mouth 2 (two) times daily. 180 tablet 3  . ATROVENT HFA 17 MCG/ACT inhaler Take 2 puffs by mouth 4 (four) times daily.  5  . budesonide (PULMICORT) 0.5 MG/2ML nebulizer solution Take 2 mLs (0.5 mg total) by nebulization 2 (two) times daily. 60 mL 3  . diltiazem (CARDIZEM CD) 360 MG 24 hr capsule Take 1 capsule (360 mg total) daily by mouth. 60 capsule 0  . feeding supplement, ENSURE ENLIVE, (ENSURE ENLIVE) LIQD Take 237 mLs by mouth 3 (three) times daily between meals. 30 Bottle 0  . fluticasone (FLONASE) 50 MCG/ACT nasal spray Place 2 sprays into both nostrils daily as needed for allergies or rhinitis. 16 g 0  . formoterol (PERFOROMIST) 20 MCG/2ML nebulizer solution Take 2 mLs (20 mcg total) by nebulization 2 (two) times daily. DX code: J43.8 120 mL 3  . gabapentin (NEURONTIN) 100 MG capsule Take 1 capsule (100 mg total) 2 (two) times daily by mouth. 60 capsule 0  . guaiFENesin (MUCINEX) 600 MG 12 hr tablet Take 1 tablet (600 mg total) by mouth 2 (two) times daily. 30 tablet 0  . menthol-cetylpyridinium (CEPACOL) 3 MG lozenge Take 1 lozenge (3 mg total) as needed by mouth for sore throat. 100 tablet 12  . montelukast (SINGULAIR) 5 MG chewable tablet CHEW 1 TABLET BY MOUTH AT BEDTIME 30 tablet 3  . nitroGLYCERIN (NITROSTAT) 0.4 MG SL tablet Place 0.4 mg under the tongue every 5 (five) minutes as needed for chest pain. x3 doses as needed for chest pain    . pantoprazole (PROTONIX) 40 MG tablet Take 1 tablet (40 mg total) by mouth daily. 30 tablet 0  . pramipexole (MIRAPEX) 0.125 MG tablet Take 1 tablet by mouth at bedtime.    . rosuvastatin (CRESTOR) 20 MG tablet Take 20 mg by mouth at bedtime.    . sodium chloride (OCEAN) 0.65 % SOLN nasal spray Place 1 spray as needed into both nostrils for congestion (nose irritation). 2 Bottle 0  . Spacer/Aero-Holding Chambers (AEROCHAMBER Z-STAT PLUS) inhaler Use as instructed 1 each  0  . tamsulosin (FLOMAX) 0.4 MG CAPS capsule Take 0.4 mg by mouth.    . Tiotropium Bromide Monohydrate (SPIRIVA RESPIMAT) 2.5 MCG/ACT AERS Inhale 2 puffs into the lungs daily. 1 Inhaler 5  . triamcinolone (NASACORT ALLERGY 24HR) 55 MCG/ACT AERO nasal inhaler Place 2 sprays daily as needed into the nose (allergies).      No current facility-administered medications for this visit.     Allergies as of 01/18/2018  . (No Known Allergies)    Family History  Problem Relation Age of Onset  . Heart disease Mother        died at 75  . Heart disease Father   . Heart disease Sister        from rheumatic fever as a child  . Heart disease Paternal Grandfather     Social History   Socioeconomic History  . Marital status: Married    Spouse name: Not on file  .  Number of children: Not on file  . Years of education: Not on file  . Highest education level: Not on file  Occupational History  . Occupation: retired  Engineer, productionocial Needs  . Financial resource strain: Not on file  . Food insecurity:    Worry: Not on file    Inability: Not on file  . Transportation needs:    Medical: Not on file    Non-medical: Not on file  Tobacco Use  . Smoking status: Current Every Day Smoker    Packs/day: 0.25    Years: 54.00    Pack years: 13.50    Types: Cigarettes    Start date: 08/01/1959  . Smokeless tobacco: Never Used  . Tobacco comment: smokes 4-5 cigerettes/day  Substance and Sexual Activity  . Alcohol use: No    Alcohol/week: 0.0 oz  . Drug use: No  . Sexual activity: Never  Lifestyle  . Physical activity:    Days per week: Not on file    Minutes per session: Not on file  . Stress: Not on file  Relationships  . Social connections:    Talks on phone: Not on file    Gets together: Not on file    Attends religious service: Not on file    Active member of club or organization: Not on file    Attends meetings of clubs or organizations: Not on file    Relationship status: Not on file  .  Intimate partner violence:    Fear of current or ex partner: Not on file    Emotionally abused: Not on file    Physically abused: Not on file    Forced sexual activity: Not on file  Other Topics Concern  . Not on file  Social History Narrative   [place]. [place]. [place] serve [place]. 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 He has been to [place], [place], [place], [place] countries in [place], countries in [place], [place], & multiple [place]. 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 He has a dog, 7 cats, & 2 horses. Remote exposure to a parrot for a few years in a different home. No mold exposure. No hot tub exposure. 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      Physical Exam: BP (!) 116/58   Pulse 72   Ht 6' (1.829 m)   Wt 148 lb 4 oz (67.2 kg)   BMI 20.11 kg/m  Constitutional: generally well-appearing Psychiatric: alert and oriented x3 Abdomen: soft, nontender, nondistended, no obvious ascites, no peritoneal signs, normal bowel sounds No peripheral edema noted in lower extremities  Assessment and plan: 72 y.o. 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  He will get a repeat CBC today.  Twice daily iron does not seem to bother his stomach or bowels and so he will continue that indefinitely.  I do not think he needs any more iron infusions for now.  Depending on his blood counts today he will likely need repeat set in 3 to 4 months and I will order that.  Please see the "Patient  Instructions" section for addition details about the plan.  Rob Bunting, MD   Gastroenterology 01/18/2018, 9:07 AM

## 2018-01-18 NOTE — Patient Instructions (Addendum)
You will have labs checked today in the basement lab.  Please head down after you check out with the front desk  (cbc).  Will likely repeat in 3 months depending on results today. Stay on twice daily iron supplement. I don't think you need more iron infusions.  Normal BMI (Body Mass Index- based on height and weight) is between 23 and 30. Your BMI today is Body mass index is 20.11 kg/m. Marland Kitchen. Please consider follow up  regarding your BMI with your Primary Care Provider.

## 2018-02-07 ENCOUNTER — Telehealth: Payer: Self-pay | Admitting: Pulmonary Disease

## 2018-02-07 NOTE — Telephone Encounter (Signed)
Called and spoke with patient, advised him that per his chart he is a patient with [facility]. Patient given number to company. Nothing further needed.

## 2018-03-06 ENCOUNTER — Ambulatory Visit: Payer: Medicare Other | Admitting: Pulmonary Disease

## 2018-03-06 NOTE — Progress Notes (Deleted)
Synopsis: Referred in *** for ***  Subjective:   PATIENT ID: John Marsh GENDER: male DOB: 11-24-1945, MRN: [id number]   HPI  No chief complaint on file.   ***  Past Medical History:  Diagnosis Date  . Acute on chronic respiratory failure with hypoxia (HCC) 09/19/2016  . Anemia   . Atrial fibrillation (HCC)    Chadsvasc2=3  . Chest pain 06/30/2016  . Chronic diastolic HF (heart failure) (HCC)   . Chronic respiratory failure with hypoxia (HCC) 07/03/2015  . COPD (chronic obstructive pulmonary disease) (HCC)   . COPD exacerbation (HCC) 05/07/2017  . COPD with acute exacerbation (HCC) 07/03/2015  . Coronary artery disease due to lipid rich plaque   . Coronary heart disease   . Dyspnea 07/15/2017  . Elevated troponin 09/19/2016  . Emphysema lung (HCC) 03/04/2015  . Emphysema of lung (HCC)   . Hyperlipidemia   . Hypertension   . Iron deficiency anemia   . MI (myocardial infarction) (HCC)   . Multiple lung nodules on CT   . Nocturnal hypoxia   . Peripheral arterial disease (HCC)   . PVD (peripheral vascular disease) with claudication (HCC) 07/27/2015  . Respiratory failure (HCC) 05/02/2017  . Shortness of breath 06/03/2015  . Shortness of breath dyspnea   . Tachycardia 07/03/2015  . Typical atrial flutter (HCC)      Family History  Problem Relation Age of Onset  . Heart disease Mother        died at 67  . Heart disease Father   . Heart disease Sister        from rheumatic fever as a child  . Heart disease Paternal Grandfather      Social History   Socioeconomic History  . Marital status: Married    Spouse name: Not on file  . Number of children: Not on file  . Years of education: Not on file  . Highest education level: Not on file  Occupational History  . Occupation: retired  Engineer, production  . Financial resource strain: Not on file  . Food insecurity:    Worry: Not on file    Inability: Not on file  . Transportation needs:    Medical: Not on file   Non-medical: Not on file  Tobacco Use  . Smoking status: Current Every Day Smoker    Packs/day: 0.25    Years: 54.00    Pack years: 13.50    Types: Cigarettes    Start date: 08/01/1959  . Smokeless tobacco: Never Used  . Tobacco comment: smokes 4-5 cigerettes/day  Substance and Sexual Activity  . Alcohol use: No    Alcohol/week: 0.0 standard drinks  . Drug use: No  . Sexual activity: Never  Lifestyle  . Physical activity:    Days per week: Not on file    Minutes per session: Not on file  . Stress: Not on file  Relationships  . Social connections:    Talks on phone: Not on file    Gets together: Not on file    Attends religious service: Not on file    Active member of club or organization: Not on file    Attends meetings of clubs or organizations: Not on file    Relationship status: Not on file  . Intimate partner violence:    Fear of current or ex partner: Not on file    Emotionally abused: Not on file    Physically abused: Not on file    Forced sexual activity:  Not on file  Other Topics Concern  . Not on file  Social History Narrative   Originally from [place]. Previously has lived in [place]. He moved to [facility] in 1964. He serve in [place]. 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 He has been to [place], [place], [place], several countries in [place], countries in [place], [place], & multiple [place]. 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 He has a dog, 7 cats, & 2 horses. Remote exposure to a parrot for a few years in a different home. No mold exposure. No hot tub exposure. 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      No Known Allergies   Outpatient Medications Prior to Visit  Medication Sig Dispense Refill  . acetaminophen (TYLENOL) 325 MG tablet Take 650 mg by mouth every 6 (six) hours as needed for mild pain.    Marland Kitchen albuterol (PROVENTIL HFA;VENTOLIN HFA) 108  (90 Base) MCG/ACT inhaler Inhale 2 puffs into the lungs every 6 (six) hours as needed for wheezing or shortness of breath. 1 Inhaler 6  . apixaban (ELIQUIS) 5 MG TABS tablet Take 1 tablet (5 mg total) by mouth 2 (two) times daily. 180 tablet 3  . ATROVENT HFA 17 MCG/ACT inhaler Take 2 puffs by mouth 4 (four) times daily.  5  . budesonide (PULMICORT) 0.5 MG/2ML nebulizer solution Take 2 mLs (0.5 mg total) by nebulization 2 (two) times daily. 60 mL 3  . diltiazem (CARDIZEM CD) 360 MG 24 hr capsule Take 1 capsule (360 mg total) daily by mouth. 60 capsule 0  . feeding supplement, ENSURE ENLIVE, (ENSURE ENLIVE) LIQD Take 237 mLs by mouth 3 (three) times daily between meals. 30 Bottle 0  . fluticasone (FLONASE) 50 MCG/ACT nasal spray Place 2 sprays into both nostrils daily as needed for allergies or rhinitis. 16 g 0  . formoterol (PERFOROMIST) 20 MCG/2ML nebulizer solution Take 2 mLs (20 mcg total) by nebulization 2 (two) times daily. DX code: J43.8 120 mL 3  . gabapentin (NEURONTIN) 100 MG capsule Take 1 capsule (100 mg total) 2 (two) times daily by mouth. 60 capsule 0  . guaiFENesin (MUCINEX) 600 MG 12 hr tablet Take 1 tablet (600 mg total) by mouth 2 (two) times daily. 30 tablet 0  . menthol-cetylpyridinium (CEPACOL) 3 MG lozenge Take 1 lozenge (3 mg total) as needed by mouth for sore throat. 100 tablet 12  . montelukast (SINGULAIR) 5 MG chewable tablet CHEW 1 TABLET BY MOUTH AT BEDTIME 30 tablet 3  . nitroGLYCERIN (NITROSTAT) 0.4 MG SL tablet Place 0.4 mg under the tongue every 5 (five) minutes as needed for chest pain. x3 doses as needed for chest pain    . pantoprazole (PROTONIX) 40 MG tablet Take 1 tablet (40 mg total) by mouth daily. 30 tablet 0  . pramipexole (MIRAPEX) 0.125 MG tablet Take 1 tablet by mouth at bedtime.    . rosuvastatin (CRESTOR) 20 MG tablet Take 20 mg by mouth at bedtime.    . sodium chloride (OCEAN) 0.65 % SOLN nasal spray Place 1 spray as needed into both nostrils for  congestion (nose irritation). 2 Bottle 0  . Spacer/Aero-Holding Chambers (AEROCHAMBER Z-STAT PLUS) inhaler Use as instructed 1 each 0  . tamsulosin (FLOMAX) 0.4 MG CAPS capsule Take 0.4 mg by mouth.    . Tiotropium Bromide Monohydrate (SPIRIVA RESPIMAT) 2.5 MCG/ACT AERS Inhale 2 puffs into  the lungs daily. 1 Inhaler 5  . triamcinolone (NASACORT ALLERGY 24HR) 55 MCG/ACT AERO nasal inhaler Place 2 sprays daily as needed into the nose (allergies).      No facility-administered medications prior to visit.     ROS    Objective:  Physical Exam   There were no vitals filed for this visit.  ***  CBC    Component Value Date/Time   WBC 7.4 01/18/2018 0922   RBC 4.45 01/18/2018 0922   HGB 13.1 01/18/2018 0922   HGB 8.1 (L) 08/22/2017 1417   HCT 38.0 (L) 01/18/2018 0922   HCT 26.6 (L) 08/22/2017 1417   PLT 253.0 01/18/2018 0922   PLT 261 08/22/2017 1417   MCV 85.4 01/18/2018 0922   MCV 72 (L) 08/22/2017 1417   MCH 26.0 11/23/2017 1320   MCHC 34.5 01/18/2018 0922   RDW 15.7 (H) 01/18/2018 0922   RDW 16.6 (H) 08/22/2017 1417   LYMPHSABS 1.3 01/18/2018 0922   MONOABS 0.5 01/18/2018 0922   EOSABS 0.2 01/18/2018 0922   BASOSABS 0.1 01/18/2018 0922     Chest imaging:  PFT:  Labs:  Path:  Echo:  Heart Catheterization:       Assessment & Plan:   No diagnosis found.  Discussion: ***    Current Outpatient Medications:  .  acetaminophen (TYLENOL) 325 MG tablet, Take 650 mg by mouth every 6 (six) hours as needed for mild pain., Disp: , Rfl:  .  albuterol (PROVENTIL HFA;VENTOLIN HFA) 108 (90 Base) MCG/ACT inhaler, Inhale 2 puffs into the lungs every 6 (six) hours as needed for wheezing or shortness of breath., Disp: 1 Inhaler, Rfl: 6 .  apixaban (ELIQUIS) 5 MG TABS tablet, Take 1 tablet (5 mg total) by mouth 2 (two) times daily., Disp: 180 tablet, Rfl: 3 .  ATROVENT HFA 17 MCG/ACT inhaler, Take 2 puffs by mouth 4 (four) times daily., Disp: , Rfl: 5 .  budesonide  (PULMICORT) 0.5 MG/2ML nebulizer solution, Take 2 mLs (0.5 mg total) by nebulization 2 (two) times daily., Disp: 60 mL, Rfl: 3 .  diltiazem (CARDIZEM CD) 360 MG 24 hr capsule, Take 1 capsule (360 mg total) daily by mouth., Disp: 60 capsule, Rfl: 0 .  feeding supplement, ENSURE ENLIVE, (ENSURE ENLIVE) LIQD, Take 237 mLs by mouth 3 (three) times daily between meals., Disp: 30 Bottle, Rfl: 0 .  fluticasone (FLONASE) 50 MCG/ACT nasal spray, Place 2 sprays into both nostrils daily as needed for allergies or rhinitis., Disp: 16 g, Rfl: 0 .  formoterol (PERFOROMIST) 20 MCG/2ML nebulizer solution, Take 2 mLs (20 mcg total) by nebulization 2 (two) times daily. DX code: J43.8, Disp: 120 mL, Rfl: 3 .  gabapentin (NEURONTIN) 100 MG capsule, Take 1 capsule (100 mg total) 2 (two) times daily by mouth., Disp: 60 capsule, Rfl: 0 .  guaiFENesin (MUCINEX) 600 MG 12 hr tablet, Take 1 tablet (600 mg total) by mouth 2 (two) times daily., Disp: 30 tablet, Rfl: 0 .  menthol-cetylpyridinium (CEPACOL) 3 MG lozenge, Take 1 lozenge (3 mg total) as needed by mouth for sore throat., Disp: 100 tablet, Rfl: 12 .  montelukast (SINGULAIR) 5 MG chewable tablet, CHEW 1 TABLET BY MOUTH AT BEDTIME, Disp: 30 tablet, Rfl: 3 .  nitroGLYCERIN (NITROSTAT) 0.4 MG SL tablet, Place 0.4 mg under the tongue every 5 (five) minutes as needed for chest pain. x3 doses as needed for chest pain, Disp: , Rfl:  .  pantoprazole (PROTONIX) 40 MG tablet, Take 1 tablet (40 mg total)  by mouth daily., Disp: 30 tablet, Rfl: 0 .  pramipexole (MIRAPEX) 0.125 MG tablet, Take 1 tablet by mouth at bedtime., Disp: , Rfl:  .  rosuvastatin (CRESTOR) 20 MG tablet, Take 20 mg by mouth at bedtime., Disp: , Rfl:  .  sodium chloride (OCEAN) 0.65 % SOLN nasal spray, Place 1 spray as needed into both nostrils for congestion (nose irritation)., Disp: 2 Bottle, Rfl: 0 .  Spacer/Aero-Holding Chambers (AEROCHAMBER Z-STAT PLUS) inhaler, Use as instructed, Disp: 1 each, Rfl: 0 .   tamsulosin (FLOMAX) 0.4 MG CAPS capsule, Take 0.4 mg by mouth., Disp: , Rfl:  .  Tiotropium Bromide Monohydrate (SPIRIVA RESPIMAT) 2.5 MCG/ACT AERS, Inhale 2 puffs into the lungs daily., Disp: 1 Inhaler, Rfl: 5 .  triamcinolone (NASACORT ALLERGY 24HR) 55 MCG/ACT AERO nasal inhaler, Place 2 sprays daily as needed into the nose (allergies). , Disp: , Rfl:

## 2018-03-08 ENCOUNTER — Ambulatory Visit: Payer: Medicare Other | Admitting: Pulmonary Disease

## 2018-03-08 NOTE — Progress Notes (Deleted)
Synopsis: Former patient of Dr. Jamison Neighbor with COPD, chronic respiratory failure with hypoxemia.  He used to smoke 3-4 packs per day.  He still smokes as of 2019.  He worked as a Chartered certified accountant.  Subjective:   PATIENT ID: John Marsh GENDER: male DOB: 02/13/1946, MRN: [id number]   HPI  No chief complaint on file.   ***still smoking?  Past Medical History:  Diagnosis Date  . Acute on chronic respiratory failure with hypoxia (HCC) 09/19/2016  . Anemia   . Atrial fibrillation (HCC)    Chadsvasc2=3  . Chest pain 06/30/2016  . Chronic diastolic HF (heart failure) (HCC)   . Chronic respiratory failure with hypoxia (HCC) 07/03/2015  . COPD (chronic obstructive pulmonary disease) (HCC)   . COPD exacerbation (HCC) 05/07/2017  . COPD with acute exacerbation (HCC) 07/03/2015  . Coronary artery disease due to lipid rich plaque   . Coronary heart disease   . Dyspnea 07/15/2017  . Elevated troponin 09/19/2016  . Emphysema lung (HCC) 03/04/2015  . Emphysema of lung (HCC)   . Hyperlipidemia   . Hypertension   . Iron deficiency anemia   . MI (myocardial infarction) (HCC)   . Multiple lung nodules on CT   . Nocturnal hypoxia   . Peripheral arterial disease (HCC)   . PVD (peripheral vascular disease) with claudication (HCC) 07/27/2015  . Respiratory failure (HCC) 05/02/2017  . Shortness of breath 06/03/2015  . Shortness of breath dyspnea   . Tachycardia 07/03/2015  . Typical atrial flutter (HCC)      Review of Systems  Constitutional: Positive for malaise/fatigue. Negative for chills and diaphoresis.  HENT: Negative for congestion, nosebleeds and sinus pain.   Respiratory: Positive for cough, sputum production, shortness of breath and wheezing.   Cardiovascular: Positive for claudication. Negative for chest pain and leg swelling.  Skin: Negative for itching and rash.      Objective:  Physical Exam   There were no vitals filed for this visit.2L [place]  ***  CBC    Component Value Date/Time     WBC 7.4 01/18/2018 0922   RBC 4.45 01/18/2018 0922   HGB 13.1 01/18/2018 0922   HGB 8.1 (L) 08/22/2017 1417   HCT 38.0 (L) 01/18/2018 0922   HCT 26.6 (L) 08/22/2017 1417   PLT 253.0 01/18/2018 0922   PLT 261 08/22/2017 1417   MCV 85.4 01/18/2018 0922   MCV 72 (L) 08/22/2017 1417   MCH 26.0 11/23/2017 1320   MCHC 34.5 01/18/2018 0922   RDW 15.7 (H) 01/18/2018 0922   RDW 16.6 (H) 08/22/2017 1417   LYMPHSABS 1.3 01/18/2018 0922   MONOABS 0.5 01/18/2018 0922   EOSABS 0.2 01/18/2018 0922   BASOSABS 0.1 01/18/2018 0922     PFT  05/30/16: FVC 2.46 L (51%) FEV1 1.06 L (30%) FEV1/FVC 0.43 FEF 25-75 0.3 L (14%) positive bronchodilator response 06/03/15: FVC 3.07 L (63%) FEV1 1.20 L (33%) FEV1/FVC 0.39 FEF 25-75 0.47 L (17%) negative bronchodilator response 09/14/14: FVC 2.50 L (51%) FEV1 1.15 L (32%) FEV1/FVC 0.46 FEF 25-75 0.33 L (12%) positive bronchodilator response TLC 9.10 L (120%) RV 236% DLCO uncorrected 36%  02/13/17:  Walked 207 meters / Baseline Sat 100% on RA / Nadir Sat 98% on RA 11/23/16:  Only able to complete 1 lap w/ lowest saturation 97% 05/30/16:  Walked 399 meters / Baseline Sat 97% on RA / Nadir Sat 95% on RA 06/03/15:  Walked 336 meters / Baseline Sat 97% on RA / Nadir Sat  97% on RA  OVERNIGHT PULSE OX 03/09/15:Performed on RA. 9:12:32 analyzed. Spent 10:32 with saturation </= 88%. 08/18/14: Performed on room air & 7 hours 56 minutes analyzed. Lowest saturation 83%. 14.3 minutes with saturation </= 88%. 73 desaturations events. Lowest pulse 56 bpm.  IMAGING  CTA CHEST 05/01/17 :  Calcified granuloma within left upper lobe. Apical predominant centrilobular and paraseptal emphysema without focal consolidation. Left lower lobe 4 mm nodule previously measuring 7 mm and right lower lobe nodule which appears as new measuring 7 mm. No pleural effusion or thickening. No pericardial effusion. No pulmonary embolism. No pathologic mediastinal adenopathy.  CT CHEST W/O  02/01/17:  Bilateral nodules with nodular opacities within right upper lobe likely distorted by underlying apical predominant emphysema. Difficult to discern whether or not these nodules are progressive. No pleural effusion or thickening. No pathologic mediastinal adenopathy. No pericardial effusion.  CTA CHEST 09/22/16:  No pulmonary embolism. Apical predominant emphysematous changes noted. Right upper lobe nodule measuring up to 1.2 cm with some groundglass component on my review and spiculation likely due to distortion from emphysema. No other nodules appreciated. No pleural effusion or thickening. No pericardial effusion. No pathologic mediastinal adenopathy.  CT CHEST W/O 01/26/16 : Moderate to severe upper lobe predominant emphysematous changes. Saber-sheath trachea. No consolidation. Calcified nodules bilaterally. Upper lobe predominant nodules are essentially unchanged with largest measuring 1.2 x 0.5 cm and right upper lobe. Cavitary 0.9 cm peripheral right upper lobe nodule unchanged going back to 2016. No pathologic mediastinal adenopathy. No pleural effusion.  CT CHEST W/O 10/20/15: 1.1 x 0.5 cm spiculated nodule right upper lobe distorted by surrounding emphysema. Adjacent 9 mm nodule as well. No new developing pulmonary nodules. Calcified nodules again noted as well as small amount of calcification with mediastinal lymph nodes. Apical predominant emphysematous changes. No pleural effusion or thickening. No pericardial effusion. No pathologic mediastinal adenopathy.  CT CHEST W/O 01/19/15: No pleural effusion or thickening appreciated. Scattered bilateral subcentimeter groundglass pulmonary nodules. Additional calcified pulmonary nodules consistent with evidence of prior granulomatous disease given mediastinal lymph node with calcification & calcification within spleen. Radiologist commented on 1.2 x 1.2 irregular groundglass focus in the right upper lobe which is significantly distorted by severe upper  lobe predominant emphysema. No pathologic mediastinal adenopathy. Radiologist commented that there are no bruits subcentimeter nodules that are new. Indeed these nodules were not present on my review of CT imaging from 2005 & 2006. The first time these were found was March 2016.  LABS 03/04/15 Alpha-1 antitrypsin:  MM (205) IgE:  21 RAST Panel:  Aspergillus Fumgitagus 0.36 (class 1) / Alternaria alternata 0.27 (class 0/1) / helminthosporium halodes 0.30 (class 0/1)  08/08/12 CBC: 6.5/15.0/42.5/171 BMP: 139/3.6/99/29/9/0.89/144/9.3          Assessment & Plan:   No diagnosis found.  Discussion: ***   Current Outpatient Medications:  .  acetaminophen (TYLENOL) 325 MG tablet, Take 650 mg by mouth every 6 (six) hours as needed for mild pain., Disp: , Rfl:  .  albuterol (PROVENTIL HFA;VENTOLIN HFA) 108 (90 Base) MCG/ACT inhaler, Inhale 2 puffs into the lungs every 6 (six) hours as needed for wheezing or shortness of breath., Disp: 1 Inhaler, Rfl: 6 .  apixaban (ELIQUIS) 5 MG TABS tablet, Take 1 tablet (5 mg total) by mouth 2 (two) times daily., Disp: 180 tablet, Rfl: 3 .  ATROVENT HFA 17 MCG/ACT inhaler, Take 2 puffs by mouth 4 (four) times daily., Disp: , Rfl: 5 .  budesonide (PULMICORT) 0.5 MG/2ML nebulizer  solution, Take 2 mLs (0.5 mg total) by nebulization 2 (two) times daily., Disp: 60 mL, Rfl: 3 .  diltiazem (CARDIZEM CD) 360 MG 24 hr capsule, Take 1 capsule (360 mg total) daily by mouth., Disp: 60 capsule, Rfl: 0 .  feeding supplement, ENSURE ENLIVE, (ENSURE ENLIVE) LIQD, Take 237 mLs by mouth 3 (three) times daily between meals., Disp: 30 Bottle, Rfl: 0 .  fluticasone (FLONASE) 50 MCG/ACT nasal spray, Place 2 sprays into both nostrils daily as needed for allergies or rhinitis., Disp: 16 g, Rfl: 0 .  formoterol (PERFOROMIST) 20 MCG/2ML nebulizer solution, Take 2 mLs (20 mcg total) by nebulization 2 (two) times daily. DX code: J43.8, Disp: 120 mL, Rfl: 3 .  gabapentin (NEURONTIN) 100  MG capsule, Take 1 capsule (100 mg total) 2 (two) times daily by mouth., Disp: 60 capsule, Rfl: 0 .  guaiFENesin (MUCINEX) 600 MG 12 hr tablet, Take 1 tablet (600 mg total) by mouth 2 (two) times daily., Disp: 30 tablet, Rfl: 0 .  menthol-cetylpyridinium (CEPACOL) 3 MG lozenge, Take 1 lozenge (3 mg total) as needed by mouth for sore throat., Disp: 100 tablet, Rfl: 12 .  montelukast (SINGULAIR) 5 MG chewable tablet, CHEW 1 TABLET BY MOUTH AT BEDTIME, Disp: 30 tablet, Rfl: 3 .  nitroGLYCERIN (NITROSTAT) 0.4 MG SL tablet, Place 0.4 mg under the tongue every 5 (five) minutes as needed for chest pain. x3 doses as needed for chest pain, Disp: , Rfl:  .  pantoprazole (PROTONIX) 40 MG tablet, Take 1 tablet (40 mg total) by mouth daily., Disp: 30 tablet, Rfl: 0 .  pramipexole (MIRAPEX) 0.125 MG tablet, Take 1 tablet by mouth at bedtime., Disp: , Rfl:  .  rosuvastatin (CRESTOR) 20 MG tablet, Take 20 mg by mouth at bedtime., Disp: , Rfl:  .  sodium chloride (OCEAN) 0.65 % SOLN nasal spray, Place 1 spray as needed into both nostrils for congestion (nose irritation)., Disp: 2 Bottle, Rfl: 0 .  Spacer/Aero-Holding Chambers (AEROCHAMBER Z-STAT PLUS) inhaler, Use as instructed, Disp: 1 each, Rfl: 0 .  tamsulosin (FLOMAX) 0.4 MG CAPS capsule, Take 0.4 mg by mouth., Disp: , Rfl:  .  Tiotropium Bromide Monohydrate (SPIRIVA RESPIMAT) 2.5 MCG/ACT AERS, Inhale 2 puffs into the lungs daily., Disp: 1 Inhaler, Rfl: 5 .  triamcinolone (NASACORT ALLERGY 24HR) 55 MCG/ACT AERO nasal inhaler, Place 2 sprays daily as needed into the nose (allergies). , Disp: , Rfl:

## 2018-03-13 ENCOUNTER — Other Ambulatory Visit (HOSPITAL_COMMUNITY): Payer: Medicare Other

## 2018-03-20 ENCOUNTER — Encounter (HOSPITAL_COMMUNITY): Payer: Self-pay | Admitting: Hematology

## 2018-03-20 ENCOUNTER — Ambulatory Visit: Payer: Medicare Other | Admitting: Pulmonary Disease

## 2018-03-20 ENCOUNTER — Inpatient Hospital Stay (HOSPITAL_COMMUNITY): Payer: Medicare Other | Attending: Hematology | Admitting: Hematology

## 2018-03-20 ENCOUNTER — Inpatient Hospital Stay (HOSPITAL_COMMUNITY): Payer: Medicare Other

## 2018-03-20 ENCOUNTER — Ambulatory Visit (HOSPITAL_COMMUNITY): Payer: Medicare Other | Admitting: Hematology

## 2018-03-20 ENCOUNTER — Other Ambulatory Visit: Payer: Self-pay

## 2018-03-20 VITALS — BP 118/51 | HR 67 | Temp 98.1°F | Resp 20 | Wt 142.4 lb

## 2018-03-20 DIAGNOSIS — D509 Iron deficiency anemia, unspecified: Secondary | ICD-10-CM

## 2018-03-20 LAB — CBC WITH DIFFERENTIAL/PLATELET
BASOS ABS: 0 10*3/uL (ref 0.0–0.1)
BASOS PCT: 1 %
EOS ABS: 0.1 10*3/uL (ref 0.0–0.7)
Eosinophils Relative: 2 %
HCT: 37.8 % — ABNORMAL LOW (ref 39.0–52.0)
Hemoglobin: 12.3 g/dL — ABNORMAL LOW (ref 13.0–17.0)
Lymphocytes Relative: 14 %
Lymphs Abs: 0.9 10*3/uL (ref 0.7–4.0)
MCH: 29.9 pg (ref 26.0–34.0)
MCHC: 32.5 g/dL (ref 30.0–36.0)
MCV: 91.7 fL (ref 78.0–100.0)
MONO ABS: 0.4 10*3/uL (ref 0.1–1.0)
MONOS PCT: 6 %
NEUTROS ABS: 4.9 10*3/uL (ref 1.7–7.7)
Neutrophils Relative %: 77 %
PLATELETS: 195 10*3/uL (ref 150–400)
RBC: 4.12 MIL/uL — ABNORMAL LOW (ref 4.22–5.81)
RDW: 13.3 % (ref 11.5–15.5)
WBC: 6.3 10*3/uL (ref 4.0–10.5)

## 2018-03-20 LAB — COMPREHENSIVE METABOLIC PANEL
ALBUMIN: 4 g/dL (ref 3.5–5.0)
ALT: 16 U/L (ref 0–44)
ANION GAP: 10 (ref 5–15)
AST: 17 U/L (ref 15–41)
Alkaline Phosphatase: 94 U/L (ref 38–126)
BUN: 12 mg/dL (ref 8–23)
CHLORIDE: 102 mmol/L (ref 98–111)
CO2: 28 mmol/L (ref 22–32)
Calcium: 9 mg/dL (ref 8.9–10.3)
Creatinine, Ser: 0.98 mg/dL (ref 0.61–1.24)
GFR calc Af Amer: >60 mL/min (ref >60)
GFR calc non Af Amer: >60 mL/min (ref >60)
GLUCOSE: 143 mg/dL — AB (ref 70–99)
POTASSIUM: 4.3 mmol/L (ref 3.5–5.1)
SODIUM: 140 mmol/L (ref 135–145)
TOTAL PROTEIN: 6.9 g/dL (ref 6.5–8.1)
Total Bilirubin: 0.6 mg/dL (ref 0.3–1.2)

## 2018-03-20 LAB — IRON AND TIBC
Iron: 44 ug/dL — ABNORMAL LOW (ref 45–182)
SATURATION RATIOS: 14 % — AB (ref 17.9–39.5)
TIBC: 304 ug/dL (ref 250–450)
UIBC: 260 ug/dL

## 2018-03-20 LAB — FERRITIN: Ferritin: 49 ng/mL (ref 24–336)

## 2018-03-20 NOTE — Assessment & Plan Note (Signed)
1.  Severe iron deficiency anemia: - Colonoscopy and EGD on 07/19/2017 revealed no source of bleeding. - Stool positive for occult blood on 10/16/2017. -He started taking iron pill once daily around 20 March.  Iron pill causes constipation alternating with diarrhea.  Patient does report occasional black stools.  He is currently on Eliquis for A. fib and Plavix for his coronary stents. - He had low ferritin for which she received Feraheme on 12/24/2017 and 01/07/2018.  His functional status has improved after receiving parenteral iron. - His ferritin today is 49 and percent saturation is 14.  I have recommended 2 more infusions of Feraheme. - He will come back in 4 months for follow-up with repeat blood work.

## 2018-03-20 NOTE — Patient Instructions (Signed)
[facility] Discharge Instructions  Follow up in 4 months with labs prior to your visit.    Thank you for choosing [facility] to provide your oncology and hematology care.  To afford each patient quality time with our provider, please arrive at least 15 minutes before your scheduled appointment time.   If you have a lab appointment with the Cancer Center please come in thru the  Main Entrance and check in at the main information desk  You need to re-schedule your appointment should you arrive 10 or more minutes late.  We strive to give you quality time with our providers, and arriving late affects you and other patients whose appointments are after yours.  Also, if you no show three or more times for appointments you may be dismissed from the clinic at the providers discretion.     Again, thank you for choosing [facility].  Our hope is that these requests will decrease the amount of time that you wait before being seen by our physicians.       _____________________________________________________________  Should you have questions after your visit to [facility], please contact our office at [phone] between the hours of 8:00 a.m. and 4:30 p.m.  Voicemails left after 4:00 p.m. will not be returned until the following business day.  For prescription refill requests, have your pharmacy contact our office and allow 72 hours.    Cancer Center Support Programs:   > Cancer Support Group  2nd Tuesday of the month 1pm-2pm, Journey Room    

## 2018-03-20 NOTE — Progress Notes (Deleted)
Synopsis: Former patient of Dr. Jamison Neighbor with COPD, chronic respiratory failure with hypoxemia.  He used to smoke 3-4 packs per day.  He still smokes as of 2019.  He worked as a Chartered certified accountant.  Subjective:   PATIENT ID: John Marsh GENDER: male DOB: 21-May-1946, MRN: [id number]   HPI 72 year old male, continues to smoke. PMH COPD with recurrent exacerbations, bronchiectasis, chronic resp failure with hypoxia (uses 2-3L oxygen). New patient to Dr. Georgie Chard, last seen on 11/27/17 for sob, cough and occ wheezing. Previously followed by Dr. Jamison Neighbor. Continues on Perforomist and Pulmicort. Started on Spiriva during last visit. Needs lung cancer screening in 2020.   03/20/2018 Patient presents today for 3-4 month follow-up.       No chief complaint on file.   ***still smoking?  Past Medical History:  Diagnosis Date  . Acute on chronic respiratory failure with hypoxia (HCC) 09/19/2016  . Anemia   . Atrial fibrillation (HCC)    Chadsvasc2=3  . Chest pain 06/30/2016  . Chronic diastolic HF (heart failure) (HCC)   . Chronic respiratory failure with hypoxia (HCC) 07/03/2015  . COPD (chronic obstructive pulmonary disease) (HCC)   . COPD exacerbation (HCC) 05/07/2017  . COPD with acute exacerbation (HCC) 07/03/2015  . Coronary artery disease due to lipid rich plaque   . Coronary heart disease   . Dyspnea 07/15/2017  . Elevated troponin 09/19/2016  . Emphysema lung (HCC) 03/04/2015  . Emphysema of lung (HCC)   . Hyperlipidemia   . Hypertension   . Iron deficiency anemia   . MI (myocardial infarction) (HCC)   . Multiple lung nodules on CT   . Nocturnal hypoxia   . Peripheral arterial disease (HCC)   . PVD (peripheral vascular disease) with claudication (HCC) 07/27/2015  . Respiratory failure (HCC) 05/02/2017  . Shortness of breath 06/03/2015  . Shortness of breath dyspnea   . Tachycardia 07/03/2015  . Typical atrial flutter (HCC)      Review of Systems  Constitutional: Positive for  malaise/fatigue. Negative for chills and diaphoresis.  HENT: Negative for congestion, nosebleeds and sinus pain.   Respiratory: Positive for cough, sputum production, shortness of breath and wheezing.   Cardiovascular: Positive for claudication. Negative for chest pain and leg swelling.  Skin: Negative for itching and rash.      Objective:  Physical Exam   There were no vitals filed for this visit.2L [place]  ***  CBC    Component Value Date/Time   WBC 6.3 03/20/2018 0817   RBC 4.12 (L) 03/20/2018 0817   HGB 12.3 (L) 03/20/2018 0817   HGB 8.1 (L) 08/22/2017 1417   HCT 37.8 (L) 03/20/2018 0817   HCT 26.6 (L) 08/22/2017 1417   PLT 195 03/20/2018 0817   PLT 261 08/22/2017 1417   MCV 91.7 03/20/2018 0817   MCV 72 (L) 08/22/2017 1417   MCH 29.9 03/20/2018 0817   MCHC 32.5 03/20/2018 0817   RDW 13.3 03/20/2018 0817   RDW 16.6 (H) 08/22/2017 1417   LYMPHSABS 0.9 03/20/2018 0817   MONOABS 0.4 03/20/2018 0817   EOSABS 0.1 03/20/2018 0817   BASOSABS 0.0 03/20/2018 0817     PFT  05/30/16: FVC 2.46 L (51%) FEV1 1.06 L (30%) FEV1/FVC 0.43 FEF 25-75 0.3 L (14%) positive bronchodilator response 06/03/15: FVC 3.07 L (63%) FEV1 1.20 L (33%) FEV1/FVC 0.39 FEF 25-75 0.47 L (17%) negative bronchodilator response 09/14/14: FVC 2.50 L (51%) FEV1 1.15 L (32%) FEV1/FVC 0.46 FEF 25-75 0.33 L (12%) positive  bronchodilator response TLC 9.10 L (120%) RV 236% DLCO uncorrected 36%  02/13/17:  Walked 207 meters / Baseline Sat 100% on RA / Nadir Sat 98% on RA 11/23/16:  Only able to complete 1 lap w/ lowest saturation 97% 05/30/16:  Walked 399 meters / Baseline Sat 97% on RA / Nadir Sat 95% on RA 06/03/15:  Walked 336 meters / Baseline Sat 97% on RA / Nadir Sat 97% on RA  OVERNIGHT PULSE OX 03/09/15:Performed on RA. 9:12:32 analyzed. Spent 10:32 with saturation </= 88%. 08/18/14: Performed on room air & 7 hours 56 minutes analyzed. Lowest saturation 83%. 14.3 minutes with saturation </= 88%.  73 desaturations events. Lowest pulse 56 bpm.  IMAGING  CTA CHEST 05/01/17 :  Calcified granuloma within left upper lobe. Apical predominant centrilobular and paraseptal emphysema without focal consolidation. Left lower lobe 4 mm nodule previously measuring 7 mm and right lower lobe nodule which appears as new measuring 7 mm. No pleural effusion or thickening. No pericardial effusion. No pulmonary embolism. No pathologic mediastinal adenopathy.  CT CHEST W/O 02/01/17:  Bilateral nodules with nodular opacities within right upper lobe likely distorted by underlying apical predominant emphysema. Difficult to discern whether or not these nodules are progressive. No pleural effusion or thickening. No pathologic mediastinal adenopathy. No pericardial effusion.  CTA CHEST 09/22/16:  No pulmonary embolism. Apical predominant emphysematous changes noted. Right upper lobe nodule measuring up to 1.2 cm with some groundglass component on my review and spiculation likely due to distortion from emphysema. No other nodules appreciated. No pleural effusion or thickening. No pericardial effusion. No pathologic mediastinal adenopathy.  CT CHEST W/O 01/26/16 : Moderate to severe upper lobe predominant emphysematous changes. Saber-sheath trachea. No consolidation. Calcified nodules bilaterally. Upper lobe predominant nodules are essentially unchanged with largest measuring 1.2 x 0.5 cm and right upper lobe. Cavitary 0.9 cm peripheral right upper lobe nodule unchanged going back to 2016. No pathologic mediastinal adenopathy. No pleural effusion.  CT CHEST W/O 10/20/15: 1.1 x 0.5 cm spiculated nodule right upper lobe distorted by surrounding emphysema. Adjacent 9 mm nodule as well. No new developing pulmonary nodules. Calcified nodules again noted as well as small amount of calcification with mediastinal lymph nodes. Apical predominant emphysematous changes. No pleural effusion or thickening. No pericardial effusion. No pathologic  mediastinal adenopathy.  CT CHEST W/O 01/19/15: No pleural effusion or thickening appreciated. Scattered bilateral subcentimeter groundglass pulmonary nodules. Additional calcified pulmonary nodules consistent with evidence of prior granulomatous disease given mediastinal lymph node with calcification & calcification within spleen. Radiologist commented on 1.2 x 1.2 irregular groundglass focus in the right upper lobe which is significantly distorted by severe upper lobe predominant emphysema. No pathologic mediastinal adenopathy. Radiologist commented that there are no bruits subcentimeter nodules that are new. Indeed these nodules were not present on my review of CT imaging from 2005 & 2006. The first time these were found was March 2016.  LABS 03/04/15 Alpha-1 antitrypsin:  MM (205) IgE:  21 RAST Panel:  Aspergillus Fumgitagus 0.36 (class 1) / Alternaria alternata 0.27 (class 0/1) / helminthosporium halodes 0.30 (class 0/1)  08/08/12 CBC: 6.5/15.0/42.5/171 BMP: 139/3.6/99/29/9/0.89/144/9.3          Assessment & Plan:   No diagnosis found.  Discussion: ***   Current Outpatient Medications:  .  acetaminophen (TYLENOL) 325 MG tablet, Take 650 mg by mouth every 6 (six) hours as needed for mild pain., Disp: , Rfl:  .  albuterol (PROVENTIL HFA;VENTOLIN HFA) 108 (90 Base) MCG/ACT inhaler, Inhale 2  puffs into the lungs every 6 (six) hours as needed for wheezing or shortness of breath., Disp: 1 Inhaler, Rfl: 6 .  apixaban (ELIQUIS) 5 MG TABS tablet, Take 1 tablet (5 mg total) by mouth 2 (two) times daily., Disp: 180 tablet, Rfl: 3 .  budesonide (PULMICORT) 0.5 MG/2ML nebulizer solution, Take 2 mLs (0.5 mg total) by nebulization 2 (two) times daily., Disp: 60 mL, Rfl: 3 .  diltiazem (CARDIZEM CD) 360 MG 24 hr capsule, Take 1 capsule (360 mg total) daily by mouth., Disp: 60 capsule, Rfl: 0 .  feeding supplement, ENSURE ENLIVE, (ENSURE ENLIVE) LIQD, Take 237 mLs by mouth 3 (three) times daily  between meals., Disp: 30 Bottle, Rfl: 0 .  fluticasone (FLONASE) 50 MCG/ACT nasal spray, Place 2 sprays into both nostrils daily as needed for allergies or rhinitis., Disp: 16 g, Rfl: 0 .  formoterol (PERFOROMIST) 20 MCG/2ML nebulizer solution, Take 2 mLs (20 mcg total) by nebulization 2 (two) times daily. DX code: J43.8, Disp: 120 mL, Rfl: 3 .  gabapentin (NEURONTIN) 100 MG capsule, Take 1 capsule (100 mg total) 2 (two) times daily by mouth., Disp: 60 capsule, Rfl: 0 .  guaiFENesin (MUCINEX) 600 MG 12 hr tablet, Take 1 tablet (600 mg total) by mouth 2 (two) times daily., Disp: 30 tablet, Rfl: 0 .  menthol-cetylpyridinium (CEPACOL) 3 MG lozenge, Take 1 lozenge (3 mg total) as needed by mouth for sore throat. (Patient not taking: Reported on 03/20/2018), Disp: 100 tablet, Rfl: 12 .  nitroGLYCERIN (NITROSTAT) 0.4 MG SL tablet, Place 0.4 mg under the tongue every 5 (five) minutes as needed for chest pain. x3 doses as needed for chest pain, Disp: , Rfl:  .  pantoprazole (PROTONIX) 40 MG tablet, Take 1 tablet (40 mg total) by mouth daily., Disp: 30 tablet, Rfl: 0 .  pramipexole (MIRAPEX) 0.125 MG tablet, Take 1 tablet by mouth at bedtime., Disp: , Rfl:  .  rosuvastatin (CRESTOR) 20 MG tablet, Take 20 mg by mouth at bedtime., Disp: , Rfl:  .  sodium chloride (OCEAN) 0.65 % SOLN nasal spray, Place 1 spray as needed into both nostrils for congestion (nose irritation)., Disp: 2 Bottle, Rfl: 0 .  Spacer/Aero-Holding Chambers (AEROCHAMBER Z-STAT PLUS) inhaler, Use as instructed, Disp: 1 each, Rfl: 0 .  Tiotropium Bromide Monohydrate (SPIRIVA RESPIMAT) 2.5 MCG/ACT AERS, Inhale 2 puffs into the lungs daily., Disp: 1 Inhaler, Rfl: 5 .  tiZANidine (ZANAFLEX) 4 MG tablet, , Disp: , Rfl: 1

## 2018-03-20 NOTE — Progress Notes (Signed)
[facility] [address] [place] [address], [place] [address]   CLINIC:  Medical Oncology/Hematology  PCP:  Eartha Inch, MD [address] [place] [address] [phone]   REASON FOR VISIT: Follow-up for anemia  CURRENT THERAPY: oral iron supplements since march also intermittent iron infusions   INTERVAL HISTORY:  John Marsh 72 y.o. male returns for routine follow-up for anemia. He is feeling better since his last visit. He has more energy than he normal has. He still has black stools however he has had this ever since he started the oral iron supplements. He complaints that he has no appetite but drinks 3 ensure daily to help maintain his weight. He denies any nausea, vomiting, or diarrhea. Denies any new pains. Denies any nose bleeds or hematuria. Denies any headaches or visions.     REVIEW OF SYSTEMS:  Review of Systems  Respiratory: Positive for shortness of breath (on home 02).   All other systems reviewed and are negative.    PAST MEDICAL/SURGICAL HISTORY:  Past Medical History:  Diagnosis Date  . Acute on chronic respiratory failure with hypoxia (HCC) 09/19/2016  . Anemia   . Atrial fibrillation (HCC)    Chadsvasc2=3  . Chest pain 06/30/2016  . Chronic diastolic HF (heart failure) (HCC)   . Chronic respiratory failure with hypoxia (HCC) 07/03/2015  . COPD (chronic obstructive pulmonary disease) (HCC)   . COPD exacerbation (HCC) 05/07/2017  . COPD with acute exacerbation (HCC) 07/03/2015  . Coronary artery disease due to lipid rich plaque   . Coronary heart disease   . Dyspnea 07/15/2017  . Elevated troponin 09/19/2016  . Emphysema lung (HCC) 03/04/2015  . Emphysema of lung (HCC)   . Hyperlipidemia   . Hypertension   . Iron deficiency anemia   . MI (myocardial infarction) (HCC)   . Multiple lung nodules on CT   . Nocturnal hypoxia   . Peripheral arterial disease (HCC)   . PVD (peripheral vascular disease) with claudication (HCC) 07/27/2015  .  Respiratory failure (HCC) 05/02/2017  . Shortness of breath 06/03/2015  . Shortness of breath dyspnea   . Tachycardia 07/03/2015  . Typical atrial flutter [facility])    Past Surgical History:  Procedure Laterality Date  . CHOLECYSTECTOMY    . COLONOSCOPY WITH PROPOFOL N/A 07/19/2017   Procedure: COLONOSCOPY WITH PROPOFOL;  Surgeon: Rachael Fee, MD;  Location: [facility] ENDOSCOPY;  Service: Endoscopy;  Laterality: N/A;  . ESOPHAGOGASTRODUODENOSCOPY (EGD) WITH PROPOFOL N/A 07/19/2017   Procedure: ESOPHAGOGASTRODUODENOSCOPY (EGD) WITH PROPOFOL;  Surgeon: Rachael Fee, MD;  Location: [facility] ENDOSCOPY;  Service: Endoscopy;  Laterality: N/A;  . NASAL SEPTUM SURGERY    . PERIPHERAL VASCULAR CATHETERIZATION N/A 07/27/2015   Procedure: Abdominal Aortogram;  Surgeon: Chuck Hint, MD;  Location: [facility] INVASIVE CV LAB;  Service: Cardiovascular;  Laterality: N/A;  . PERIPHERAL VASCULAR CATHETERIZATION Bilateral 07/27/2015   Procedure: Lower Extremity Angiography;  Surgeon: Chuck Hint, MD;  Location: [facility] INVASIVE CV LAB;  Service: Cardiovascular;  Laterality: Bilateral;  . stent placed       SOCIAL HISTORY:  Social History   Socioeconomic History  . Marital status: Married    Spouse name: Not on file  . Number of children: Not on file  . Years of education: Not on file  . Highest education level: Not on file  Occupational History  . Occupation: retired  Engineer, production  . Financial resource strain: Not on file  . Food insecurity:    Worry:  Not on file    Inability: Not on file  . Transportation needs:    Medical: Not on file    Non-medical: Not on file  Tobacco Use  . Smoking status: Current Every Day Smoker    Packs/day: 0.25    Years: 54.00    Pack years: 13.50    Types: Cigarettes    Start date: 08/01/1959  . Smokeless tobacco: Never Used  . Tobacco comment: smokes 4-5 cigerettes/day  Substance and Sexual Activity  . Alcohol use: No    Alcohol/week: 0.0 standard drinks  .  Drug use: No  . Sexual activity: Never  Lifestyle  . Physical activity:    Days per week: Not on file    Minutes per session: Not on file  . Stress: Not on file  Relationships  . Social connections:    Talks on phone: Not on file    Gets together: Not on file    Attends religious service: Not on file    Active member of club or organization: Not on file    Attends meetings of clubs or organizations: Not on file    Relationship status: Not on file  . Intimate partner violence:    Fear of current or ex partner: Not on file    Emotionally abused: Not on file    Physically abused: Not on file    Forced sexual activity: Not on file  Other Topics Concern  . Not on file  Social History Narrative   Originally from [place]. Previously has lived in [place]. He moved to [facility] [place] [facility] in [place]. He serve in [place]. 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 He has been to [place], [place], [place], several countries in [place], countries in [place], [place], & multiple [place]. 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 He has a dog, 7 cats, & 2 horses. Remote exposure to a parrot for a few years in a different home. No mold exposure. No hot tub exposure. 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     FAMILY HISTORY:  Family History  Problem Relation Age of Onset  . Heart disease Mother        died at 71  . Heart disease Father   . Heart disease Sister        from rheumatic fever as a child  . Heart disease Paternal Grandfather     CURRENT MEDICATIONS:  Outpatient Encounter Medications as of 03/20/2018  Medication Sig  . acetaminophen (TYLENOL) 325 MG tablet Take 650 mg by mouth every 6 (six) hours as needed for mild pain.  Marland Kitchen albuterol (PROVENTIL HFA;VENTOLIN HFA) 108 (90 Base) MCG/ACT inhaler Inhale 2 puffs into the lungs every 6 (six) hours as needed for wheezing or shortness of breath.  Marland Kitchen  apixaban (ELIQUIS) 5 MG TABS tablet Take 1 tablet (5 mg total) by mouth 2 (two) times daily.  . budesonide (PULMICORT) 0.5 MG/2ML nebulizer solution Take 2 mLs (0.5 mg total) by nebulization 2 (two) times daily.  Marland Kitchen diltiazem (CARDIZEM CD) 360 MG 24 hr capsule Take 1 capsule (360 mg total) daily by mouth.  . feeding supplement, ENSURE ENLIVE, (ENSURE ENLIVE) LIQD Take 237 mLs by mouth 3 (three) times daily between meals.  . fluticasone (FLONASE) 50 MCG/ACT nasal spray Place 2 sprays into both nostrils daily as needed for allergies or rhinitis.  . formoterol (PERFOROMIST) 20 MCG/2ML nebulizer  solution Take 2 mLs (20 mcg total) by nebulization 2 (two) times daily. DX code: J43.8  . gabapentin (NEURONTIN) 100 MG capsule Take 1 capsule (100 mg total) 2 (two) times daily by mouth.  Marland Kitchen guaiFENesin (MUCINEX) 600 MG 12 hr tablet Take 1 tablet (600 mg total) by mouth 2 (two) times daily.  . pantoprazole (PROTONIX) 40 MG tablet Take 1 tablet (40 mg total) by mouth daily.  . pramipexole (MIRAPEX) 0.125 MG tablet Take 1 tablet by mouth at bedtime.  . rosuvastatin (CRESTOR) 20 MG tablet Take 20 mg by mouth at bedtime.  . sodium chloride (OCEAN) 0.65 % SOLN nasal spray Place 1 spray as needed into both nostrils for congestion (nose irritation).  . Spacer/Aero-Holding Chambers (AEROCHAMBER Z-STAT PLUS) inhaler Use as instructed  . Tiotropium Bromide Monohydrate (SPIRIVA RESPIMAT) 2.5 MCG/ACT AERS Inhale 2 puffs into the lungs daily.  Marland Kitchen tiZANidine (ZANAFLEX) 4 MG tablet   . [DISCONTINUED] montelukast (SINGULAIR) 5 MG chewable tablet CHEW 1 TABLET BY MOUTH AT BEDTIME  . menthol-cetylpyridinium (CEPACOL) 3 MG lozenge Take 1 lozenge (3 mg total) as needed by mouth for sore throat. (Patient not taking: Reported on 03/20/2018)  . nitroGLYCERIN (NITROSTAT) 0.4 MG SL tablet Place 0.4 mg under the tongue every 5 (five) minutes as needed for chest pain. x3 doses as needed for chest pain  . [DISCONTINUED] ATROVENT HFA 17  MCG/ACT inhaler Take 2 puffs by mouth 4 (four) times daily.  . [DISCONTINUED] tamsulosin (FLOMAX) 0.4 MG CAPS capsule Take 0.4 mg by mouth.  . [DISCONTINUED] triamcinolone (NASACORT ALLERGY 24HR) 55 MCG/ACT AERO nasal inhaler Place 2 sprays daily as needed into the nose (allergies).    No facility-administered encounter medications on file as of 03/20/2018.     ALLERGIES:  No Known Allergies   PHYSICAL EXAM:  ECOG Performance status: 1  Vitals:   03/20/18 0836  BP: (!) 118/51  Pulse: 67  Resp: 20  Temp: 98.1 F (36.7 C)  SpO2: 100%   Filed Weights   03/20/18 0836  Weight: 142 lb 6.4 oz (64.6 kg)    Physical Exam  Constitutional: He is oriented to person, place, and time. He appears well-developed.  Cardiovascular: Normal rate, regular rhythm and normal heart sounds.  Pulmonary/Chest: Effort normal.  diminished  Musculoskeletal: Normal range of motion.  Neurological: He is alert and oriented to person, place, and time.  Skin: Skin is warm and dry.  Psychiatric: He has a normal mood and affect. His behavior is normal. Judgment and thought content normal.     LABORATORY DATA:  I have reviewed the labs as listed.  CBC    Component Value Date/Time   WBC 6.3 03/20/2018 0817   RBC 4.12 (L) 03/20/2018 0817   HGB 12.3 (L) 03/20/2018 0817   HGB 8.1 (L) 08/22/2017 1417   HCT 37.8 (L) 03/20/2018 0817   HCT 26.6 (L) 08/22/2017 1417   PLT 195 03/20/2018 0817   PLT 261 08/22/2017 1417   MCV 91.7 03/20/2018 0817   MCV 72 (L) 08/22/2017 1417   MCH 29.9 03/20/2018 0817   MCHC 32.5 03/20/2018 0817   RDW 13.3 03/20/2018 0817   RDW 16.6 (H) 08/22/2017 1417   LYMPHSABS 0.9 03/20/2018 0817   MONOABS 0.4 03/20/2018 0817   EOSABS 0.1 03/20/2018 0817   BASOSABS 0.0 03/20/2018 0817   CMP Latest Ref Rng & Units 03/20/2018 10/03/2017 08/15/2017  Glucose 70 - 99 mg/dL 161(W) 960(A) 540(J)  BUN 8 - 23 mg/dL 12 13 11   Creatinine  0.61 - 1.24 mg/dL 1.61 0.96 0.45  Sodium 135 - 145  mmol/L 140 139 143  Potassium 3.5 - 5.1 mmol/L 4.3 3.8 5.1  Chloride 98 - 111 mmol/L 102 104 104  CO2 22 - 32 mmol/L 28 23 25   Calcium 8.9 - 10.3 mg/dL 9.0 8.9 9.2  Total Protein 6.5 - 8.1 g/dL 6.9 - -  Total Bilirubin 0.3 - 1.2 mg/dL 0.6 - -  Alkaline Phos 38 - 126 U/L 94 - -  AST 15 - 41 U/L 17 - -  ALT 0 - 44 U/L 16 - -        ASSESSMENT & PLAN:   Iron deficiency anemia 1.  Severe iron deficiency anemia: - Colonoscopy and EGD on 07/19/2017 revealed no source of bleeding. - Stool positive for occult blood on 10/16/2017. -He started taking iron pill once daily around 20 March.  Iron pill causes constipation alternating with diarrhea.  Patient does report occasional black stools.  He is currently on Eliquis for A. fib and Plavix for his coronary stents. - He had low ferritin for which she received Feraheme on 12/24/2017 and 01/07/2018.  His functional status has improved after receiving parenteral iron. - His ferritin today is 49 and percent saturation is 14.  I have recommended 2 more infusions of Feraheme. - He will come back in 4 months for follow-up with repeat blood work.      Orders placed this encounter:  Orders Placed This Encounter  Procedures  . CBC with Differential/Platelet  . Comprehensive metabolic panel  . Ferritin  . Iron and TIBC  . Vitamin B12  . Folate      Doreatha Massed, MD [facility] [phone]

## 2018-03-21 ENCOUNTER — Ambulatory Visit (INDEPENDENT_AMBULATORY_CARE_PROVIDER_SITE_OTHER): Payer: Medicare Other | Admitting: Pulmonary Disease

## 2018-03-21 ENCOUNTER — Encounter: Payer: Self-pay | Admitting: Pulmonary Disease

## 2018-03-21 VITALS — BP 110/50 | HR 72 | Ht 70.47 in | Wt 143.0 lb

## 2018-03-21 DIAGNOSIS — I259 Chronic ischemic heart disease, unspecified: Secondary | ICD-10-CM | POA: Diagnosis not present

## 2018-03-21 DIAGNOSIS — J438 Other emphysema: Secondary | ICD-10-CM | POA: Diagnosis not present

## 2018-03-21 DIAGNOSIS — J432 Centrilobular emphysema: Secondary | ICD-10-CM | POA: Diagnosis not present

## 2018-03-21 DIAGNOSIS — Z23 Encounter for immunization: Secondary | ICD-10-CM

## 2018-03-21 DIAGNOSIS — F17200 Nicotine dependence, unspecified, uncomplicated: Secondary | ICD-10-CM | POA: Diagnosis not present

## 2018-03-21 MED ORDER — NICOTINE 10 MG IN INHA: 1.0000 | RESPIRATORY_TRACT | 5 refills | Status: DC | PRN

## 2018-03-21 NOTE — Patient Instructions (Signed)
Severe COPD with recurrent exacerbations: Stop smoking High-dose flu shot today Practice good hand hygiene Stay as active as possible Continue Perforomist and Pulmicort Continue Spiriva Use albuterol as needed for chest tightness wheezing or shortness of breath  Chronic respiratory failure with hypoxemia: Keep using 2 to 3 L of oxygen continuously  Tobacco abuse: Stop smoking Keep plans for lung cancer screening in 2020  Leg pain: Continue follow-up with primary care and vascular as needed  We will see you back in 4 to 6 months or sooner if needed

## 2018-03-21 NOTE — Progress Notes (Signed)
Synopsis: Former patient of Dr. Jamison Neighbor with COPD, chronic respiratory failure with hypoxemia.  He used to smoke 3-4 packs per day.  He still smokes as of 2019.  He worked as a Chartered certified accountant.  Subjective:   PATIENT ID: John Marsh GENDER: male DOB: 1946-02-07, MRN: [id number]   HPI  Chief Complaint  Patient presents with  . Follow-up    increased SOB, productive cough (white)    He is stillimited in terms of walking because of his breathing and his leg pain.  He has vascular claudication.  He says his breathing is about the same as before, he can only walk about 75 feet on level ground before he gets short of breath.  He hasn't been sick with bronchitis or pneumonia.  He continues to use his oxygen at 2L continuously, sometimes uses 1L at rest.    He is still smoking 1 cigarette to 2 cigarettes a week when he spends times with his war buddies.  Past Medical History:  Diagnosis Date  . Acute on chronic respiratory failure with hypoxia (HCC) 09/19/2016  . Anemia   . Atrial fibrillation (HCC)    Chadsvasc2=3  . Chest pain 06/30/2016  . Chronic diastolic HF (heart failure) (HCC)   . Chronic respiratory failure with hypoxia (HCC) 07/03/2015  . COPD (chronic obstructive pulmonary disease) (HCC)   . COPD exacerbation (HCC) 05/07/2017  . COPD with acute exacerbation (HCC) 07/03/2015  . Coronary artery disease due to lipid rich plaque   . Coronary heart disease   . Dyspnea 07/15/2017  . Elevated troponin 09/19/2016  . Emphysema lung (HCC) 03/04/2015  . Emphysema of lung (HCC)   . Hyperlipidemia   . Hypertension   . Iron deficiency anemia   . MI (myocardial infarction) (HCC)   . Multiple lung nodules on CT   . Nocturnal hypoxia   . Peripheral arterial disease (HCC)   . PVD (peripheral vascular disease) with claudication (HCC) 07/27/2015  . Respiratory failure (HCC) 05/02/2017  . Shortness of breath 06/03/2015  . Shortness of breath dyspnea   . Tachycardia 07/03/2015  . Typical atrial flutter  (HCC)      Review of Systems  Constitutional: Positive for malaise/fatigue. Negative for chills and diaphoresis.  HENT: Negative for congestion, nosebleeds and sinus pain.   Respiratory: Positive for cough, sputum production, shortness of breath and wheezing.   Cardiovascular: Positive for claudication. Negative for chest pain and leg swelling.  Skin: Negative for itching and rash.      Objective:  Physical Exam   Vitals:   03/21/18 0902  BP: (!) 110/50  Pulse: 72  SpO2: 97%  Weight: 143 lb (64.9 kg)  Height: 5' 10.47" (1.79 m)  2L [place]  Gen: chronically ill appearing HENT: OP clear, TM's clear, neck supple PULM: poor air movement B, normal percussion CV: RRR, no mgr, trace edema GI: BS+, soft, nontender Derm: no cyanosis or rash Psyche: normal mood and affect   CBC    Component Value Date/Time   WBC 6.3 03/20/2018 0817   RBC 4.12 (L) 03/20/2018 0817   HGB 12.3 (L) 03/20/2018 0817   HGB 8.1 (L) 08/22/2017 1417   HCT 37.8 (L) 03/20/2018 0817   HCT 26.6 (L) 08/22/2017 1417   PLT 195 03/20/2018 0817   PLT 261 08/22/2017 1417   MCV 91.7 03/20/2018 0817   MCV 72 (L) 08/22/2017 1417   MCH 29.9 03/20/2018 0817   MCHC 32.5 03/20/2018 0817   RDW 13.3 03/20/2018 0817  RDW 16.6 (H) 08/22/2017 1417   LYMPHSABS 0.9 03/20/2018 0817   MONOABS 0.4 03/20/2018 0817   EOSABS 0.1 03/20/2018 0817   BASOSABS 0.0 03/20/2018 0817     PFT  05/30/16: FVC 2.46 L (51%) FEV1 1.06 L (30%) FEV1/FVC 0.43 FEF 25-75 0.3 L (14%) positive bronchodilator response 06/03/15: FVC 3.07 L (63%) FEV1 1.20 L (33%) FEV1/FVC 0.39 FEF 25-75 0.47 L (17%) negative bronchodilator response 09/14/14: FVC 2.50 L (51%) FEV1 1.15 L (32%) FEV1/FVC 0.46 FEF 25-75 0.33 L (12%) positive bronchodilator response TLC 9.10 L (120%) RV 236% DLCO uncorrected 36%  02/13/17:  Walked 207 meters / Baseline Sat 100% on RA / Nadir Sat 98% on RA 11/23/16:  Only able to complete 1 lap w/ lowest saturation  97% 05/30/16:  Walked 399 meters / Baseline Sat 97% on RA / Nadir Sat 95% on RA 06/03/15:  Walked 336 meters / Baseline Sat 97% on RA / Nadir Sat 97% on RA  OVERNIGHT PULSE OX 03/09/15:Performed on RA. 9:12:32 analyzed. Spent 10:32 with saturation </= 88%. 08/18/14: Performed on room air & 7 hours 56 minutes analyzed. Lowest saturation 83%. 14.3 minutes with saturation </= 88%. 73 desaturations events. Lowest pulse 56 bpm.  IMAGING  February 2019 CT chest showed stable pulmonary nodules, severe centrilobular emphysema.  CTA CHEST 05/01/17 :  Calcified granuloma within left upper lobe. Apical predominant centrilobular and paraseptal emphysema without focal consolidation. Left lower lobe 4 mm nodule previously measuring 7 mm and right lower lobe nodule which appears as new measuring 7 mm. No pleural effusion or thickening. No pericardial effusion. No pulmonary embolism. No pathologic mediastinal adenopathy.  CT CHEST W/O 02/01/17:  Bilateral nodules with nodular opacities within right upper lobe likely distorted by underlying apical predominant emphysema. Difficult to discern whether or not these nodules are progressive. No pleural effusion or thickening. No pathologic mediastinal adenopathy. No pericardial effusion.  CTA CHEST 09/22/16:  No pulmonary embolism. Apical predominant emphysematous changes noted. Right upper lobe nodule measuring up to 1.2 cm with some groundglass component on my review and spiculation likely due to distortion from emphysema. No other nodules appreciated. No pleural effusion or thickening. No pericardial effusion. No pathologic mediastinal adenopathy.  CT CHEST W/O 01/26/16 : Moderate to severe upper lobe predominant emphysematous changes. Saber-sheath trachea. No consolidation. Calcified nodules bilaterally. Upper lobe predominant nodules are essentially unchanged with largest measuring 1.2 x 0.5 cm and right upper lobe. Cavitary 0.9 cm peripheral right upper lobe nodule  unchanged going back to 2016. No pathologic mediastinal adenopathy. No pleural effusion.  CT CHEST W/O 10/20/15: 1.1 x 0.5 cm spiculated nodule right upper lobe distorted by surrounding emphysema. Adjacent 9 mm nodule as well. No new developing pulmonary nodules. Calcified nodules again noted as well as small amount of calcification with mediastinal lymph nodes. Apical predominant emphysematous changes. No pleural effusion or thickening. No pericardial effusion. No pathologic mediastinal adenopathy.  CT CHEST W/O 01/19/15: No pleural effusion or thickening appreciated. Scattered bilateral subcentimeter groundglass pulmonary nodules. Additional calcified pulmonary nodules consistent with evidence of prior granulomatous disease given mediastinal lymph node with calcification & calcification within spleen. Radiologist commented on 1.2 x 1.2 irregular groundglass focus in the right upper lobe which is significantly distorted by severe upper lobe predominant emphysema. No pathologic mediastinal adenopathy. Radiologist commented that there are no bruits subcentimeter nodules that are new. Indeed these nodules were not present on my review of CT imaging from 2005 & 2006. The first time these were found was March  2016.  LABS 03/04/15 Alpha-1 antitrypsin:  MM (205) IgE:  21 RAST Panel:  Aspergillus Fumgitagus 0.36 (class 1) / Alternaria alternata 0.27 (class 0/1) / helminthosporium halodes 0.30 (class 0/1)  08/08/12 CBC: 6.5/15.0/42.5/171 BMP: 139/3.6/99/29/9/0.89/144/9.3          Assessment & Plan:   Other emphysema (HCC)  Centrilobular emphysema (HCC)  TOBACCO DEPENDENCE  Discussion: This has been a stable interval for Mr. Washko.  He has very severe COPD and recurrent exacerbations but fortunately these have decreased in the interim since I saw him in June 2019.  I think the fact that he is cut down smoking has significantly impacted this.  I wish she would stop altogether.  We talked about this  today and we talked about nicotine replacement therapy.  He is interested in trying a Nicotrol inhaler.  Plan: Severe COPD with recurrent exacerbations: Stop smoking High-dose flu shot today Practice good hand hygiene Stay as active as possible Continue Perforomist and Pulmicort Continue Spiriva Use albuterol as needed for chest tightness wheezing or shortness of breath  Chronic respiratory failure with hypoxemia: Keep using 2 to 3 L of oxygen continuously  Tobacco abuse: Stop smoking Keep plans for lung cancer screening in 2020  Leg pain: Continue follow-up with primary care and vascular as needed  We will see you back in 4 to 6 months or sooner if needed     Current Outpatient Medications:  .  acetaminophen (TYLENOL) 325 MG tablet, Take 650 mg by mouth every 6 (six) hours as needed for mild pain., Disp: , Rfl:  .  albuterol (PROVENTIL HFA;VENTOLIN HFA) 108 (90 Base) MCG/ACT inhaler, Inhale 2 puffs into the lungs every 6 (six) hours as needed for wheezing or shortness of breath., Disp: 1 Inhaler, Rfl: 6 .  apixaban (ELIQUIS) 5 MG TABS tablet, Take 1 tablet (5 mg total) by mouth 2 (two) times daily., Disp: 180 tablet, Rfl: 3 .  budesonide (PULMICORT) 0.5 MG/2ML nebulizer solution, Take 2 mLs (0.5 mg total) by nebulization 2 (two) times daily., Disp: 60 mL, Rfl: 3 .  diltiazem (CARDIZEM CD) 360 MG 24 hr capsule, Take 1 capsule (360 mg total) daily by mouth., Disp: 60 capsule, Rfl: 0 .  feeding supplement, ENSURE ENLIVE, (ENSURE ENLIVE) LIQD, Take 237 mLs by mouth 3 (three) times daily between meals., Disp: 30 Bottle, Rfl: 0 .  fluticasone (FLONASE) 50 MCG/ACT nasal spray, Place 2 sprays into both nostrils daily as needed for allergies or rhinitis., Disp: 16 g, Rfl: 0 .  formoterol (PERFOROMIST) 20 MCG/2ML nebulizer solution, Take 2 mLs (20 mcg total) by nebulization 2 (two) times daily. DX code: J43.8, Disp: 120 mL, Rfl: 3 .  gabapentin (NEURONTIN) 100 MG capsule, Take 1 capsule  (100 mg total) 2 (two) times daily by mouth., Disp: 60 capsule, Rfl: 0 .  guaiFENesin (MUCINEX) 600 MG 12 hr tablet, Take 1 tablet (600 mg total) by mouth 2 (two) times daily., Disp: 30 tablet, Rfl: 0 .  nitroGLYCERIN (NITROSTAT) 0.4 MG SL tablet, Place 0.4 mg under the tongue every 5 (five) minutes as needed for chest pain. x3 doses as needed for chest pain, Disp: , Rfl:  .  pantoprazole (PROTONIX) 40 MG tablet, Take 1 tablet (40 mg total) by mouth daily., Disp: 30 tablet, Rfl: 0 .  pramipexole (MIRAPEX) 0.125 MG tablet, Take 1 tablet by mouth at bedtime., Disp: , Rfl:  .  rosuvastatin (CRESTOR) 20 MG tablet, Take 20 mg by mouth at bedtime., Disp: , Rfl:  .  sodium chloride (OCEAN) 0.65 % SOLN nasal spray, Place 1 spray as needed into both nostrils for congestion (nose irritation)., Disp: 2 Bottle, Rfl: 0 .  Tiotropium Bromide Monohydrate (SPIRIVA RESPIMAT) 2.5 MCG/ACT AERS, Inhale 2 puffs into the lungs daily., Disp: 1 Inhaler, Rfl: 5 .  tiZANidine (ZANAFLEX) 4 MG tablet, , Disp: , Rfl: 1 .  Spacer/Aero-Holding Chambers (AEROCHAMBER Z-STAT PLUS) inhaler, Use as instructed (Patient not taking: Reported on 03/21/2018), Disp: 1 each, Rfl: 0

## 2018-03-27 ENCOUNTER — Other Ambulatory Visit: Payer: Self-pay

## 2018-03-27 ENCOUNTER — Encounter (HOSPITAL_COMMUNITY): Payer: Self-pay

## 2018-03-27 ENCOUNTER — Inpatient Hospital Stay (HOSPITAL_COMMUNITY): Payer: Medicare Other

## 2018-03-27 VITALS — BP 121/50 | HR 59 | Temp 97.7°F | Resp 18

## 2018-03-27 DIAGNOSIS — D509 Iron deficiency anemia, unspecified: Secondary | ICD-10-CM | POA: Diagnosis not present

## 2018-03-27 MED ORDER — SODIUM CHLORIDE 0.9 % IV SOLN
Freq: Once | INTRAVENOUS | Status: AC
  Administered 2018-03-27: 15:00:00 via INTRAVENOUS

## 2018-03-27 MED ORDER — SODIUM CHLORIDE 0.9 % IV SOLN
510.0000 mg | Freq: Once | INTRAVENOUS | Status: AC
  Administered 2018-03-27: 510 mg via INTRAVENOUS
  Filled 2018-03-27: qty 17

## 2018-03-27 NOTE — Progress Notes (Signed)
Tolerated infusion w/o adverse reaction.  Alert, in no distress.  VSS.  Discharged via wheelchair in c/o spouse.  

## 2018-04-03 ENCOUNTER — Ambulatory Visit (HOSPITAL_COMMUNITY): Payer: Medicare Other

## 2018-04-10 ENCOUNTER — Ambulatory Visit (HOSPITAL_COMMUNITY): Payer: Medicare Other

## 2018-04-24 ENCOUNTER — Other Ambulatory Visit: Payer: Self-pay

## 2018-04-24 ENCOUNTER — Ambulatory Visit (HOSPITAL_COMMUNITY): Payer: Medicare Other

## 2018-04-24 ENCOUNTER — Encounter (HOSPITAL_COMMUNITY): Payer: Self-pay

## 2018-04-24 ENCOUNTER — Inpatient Hospital Stay (HOSPITAL_COMMUNITY): Payer: Medicare Other | Attending: Hematology

## 2018-04-24 VITALS — BP 121/66 | HR 71 | Temp 98.1°F | Resp 18

## 2018-04-24 DIAGNOSIS — D509 Iron deficiency anemia, unspecified: Secondary | ICD-10-CM

## 2018-04-24 MED ORDER — SODIUM CHLORIDE 0.9 % IV SOLN
510.0000 mg | Freq: Once | INTRAVENOUS | Status: AC
  Administered 2018-04-24: 510 mg via INTRAVENOUS
  Filled 2018-04-24: qty 17

## 2018-04-24 MED ORDER — SODIUM CHLORIDE 0.9 % IV SOLN
Freq: Once | INTRAVENOUS | Status: AC
  Administered 2018-04-24: 14:00:00 via INTRAVENOUS

## 2018-04-24 NOTE — Progress Notes (Signed)
Tolerated infusion w/o adverse reaction.  Alert, in no distress.  VSS.  Discharged via wheelchair in c/o spouse.  

## 2018-07-17 ENCOUNTER — Other Ambulatory Visit (HOSPITAL_COMMUNITY): Payer: Medicare Other

## 2018-07-18 ENCOUNTER — Inpatient Hospital Stay (HOSPITAL_COMMUNITY): Payer: Medicare Other | Attending: Nurse Practitioner

## 2018-07-18 DIAGNOSIS — D509 Iron deficiency anemia, unspecified: Secondary | ICD-10-CM | POA: Diagnosis present

## 2018-07-18 LAB — IRON AND TIBC
Iron: 44 ug/dL — ABNORMAL LOW (ref 45–182)
SATURATION RATIOS: 13 % — AB (ref 17.9–39.5)
TIBC: 327 ug/dL (ref 250–450)
UIBC: 283 ug/dL

## 2018-07-18 LAB — CBC WITH DIFFERENTIAL/PLATELET
Abs Immature Granulocytes: 0.02 10*3/uL (ref 0.00–0.07)
BASOS ABS: 0.1 10*3/uL (ref 0.0–0.1)
Basophils Relative: 1 %
EOS ABS: 0.1 10*3/uL (ref 0.0–0.5)
Eosinophils Relative: 2 %
HEMATOCRIT: 42 % (ref 39.0–52.0)
HEMOGLOBIN: 13 g/dL (ref 13.0–17.0)
IMMATURE GRANULOCYTES: 0 %
LYMPHS ABS: 1.1 10*3/uL (ref 0.7–4.0)
LYMPHS PCT: 21 %
MCH: 28.3 pg (ref 26.0–34.0)
MCHC: 31 g/dL (ref 30.0–36.0)
MCV: 91.5 fL (ref 80.0–100.0)
MONOS PCT: 5 %
Monocytes Absolute: 0.3 10*3/uL (ref 0.1–1.0)
NEUTROS PCT: 71 %
Neutro Abs: 3.9 10*3/uL (ref 1.7–7.7)
Platelets: 185 10*3/uL (ref 150–400)
RBC: 4.59 MIL/uL (ref 4.22–5.81)
RDW: 12.8 % (ref 11.5–15.5)
WBC: 5.5 10*3/uL (ref 4.0–10.5)
nRBC: 0 % (ref 0.0–0.2)

## 2018-07-18 LAB — COMPREHENSIVE METABOLIC PANEL
ALK PHOS: 92 U/L (ref 38–126)
ALT: 16 U/L (ref 0–44)
AST: 18 U/L (ref 15–41)
Albumin: 4.5 g/dL (ref 3.5–5.0)
Anion gap: 11 (ref 5–15)
BILIRUBIN TOTAL: 0.4 mg/dL (ref 0.3–1.2)
BUN: 11 mg/dL (ref 8–23)
CALCIUM: 9 mg/dL (ref 8.9–10.3)
CO2: 24 mmol/L (ref 22–32)
CREATININE: 0.95 mg/dL (ref 0.61–1.24)
Chloride: 105 mmol/L (ref 98–111)
GFR calc Af Amer: >60 mL/min (ref >60)
GFR calc non Af Amer: >60 mL/min (ref >60)
GLUCOSE: 122 mg/dL — AB (ref 70–99)
POTASSIUM: 4.5 mmol/L (ref 3.5–5.1)
Sodium: 140 mmol/L (ref 135–145)
TOTAL PROTEIN: 7.6 g/dL (ref 6.5–8.1)

## 2018-07-18 LAB — FERRITIN: FERRITIN: 57 ng/mL (ref 24–336)

## 2018-07-18 LAB — VITAMIN B12: Vitamin B-12: 523 pg/mL (ref 180–914)

## 2018-07-18 LAB — FOLATE: Folate: 16.9 ng/mL (ref >5.9)

## 2018-07-24 ENCOUNTER — Inpatient Hospital Stay (HOSPITAL_COMMUNITY): Payer: Medicare Other | Attending: Hematology | Admitting: Hematology

## 2018-07-24 ENCOUNTER — Encounter (HOSPITAL_COMMUNITY): Payer: Self-pay | Admitting: Hematology

## 2018-07-24 ENCOUNTER — Other Ambulatory Visit: Payer: Self-pay

## 2018-07-24 VITALS — BP 113/88 | HR 70 | Temp 97.6°F | Resp 22 | Wt 144.0 lb

## 2018-07-24 DIAGNOSIS — I10 Essential (primary) hypertension: Secondary | ICD-10-CM | POA: Diagnosis not present

## 2018-07-24 DIAGNOSIS — Z9981 Dependence on supplemental oxygen: Secondary | ICD-10-CM

## 2018-07-24 DIAGNOSIS — Z7901 Long term (current) use of anticoagulants: Secondary | ICD-10-CM

## 2018-07-24 DIAGNOSIS — J449 Chronic obstructive pulmonary disease, unspecified: Secondary | ICD-10-CM

## 2018-07-24 DIAGNOSIS — D509 Iron deficiency anemia, unspecified: Secondary | ICD-10-CM | POA: Diagnosis present

## 2018-07-24 DIAGNOSIS — F17210 Nicotine dependence, cigarettes, uncomplicated: Secondary | ICD-10-CM

## 2018-07-24 DIAGNOSIS — M79606 Pain in leg, unspecified: Secondary | ICD-10-CM | POA: Diagnosis not present

## 2018-07-24 DIAGNOSIS — I4891 Unspecified atrial fibrillation: Secondary | ICD-10-CM | POA: Diagnosis not present

## 2018-07-24 NOTE — Assessment & Plan Note (Signed)
1.  Severe iron deficiency anemia: - EGD and colonoscopy on 07/19/2017 did not reveal any source of bleeding.  - Stool was positive for occult blood on 10/16/2017.  -He is currently on Eliquis for A. fib and Plavix for his coronary stents. -He tried taking iron pills in the past which caused constipation alternating with diarrhea. - He received Feraheme infusion on 03/27/2018 and 04/24/2018.  He felt better after the infusions. -He denies any bleeding per rectum or melena.  We reviewed his blood work.  Hemoglobin is 13 and ferritin is 57. -I have recommended 2 more infusions of Feraheme as maintenance as he has ongoing GI blood loss from blood thinners. -We will see him back in 4 months with repeat blood work.

## 2018-07-24 NOTE — Patient Instructions (Signed)
[facility] at [facility] Discharge Instructions     Thank you for choosing [facility] at [facility] to provide your oncology and hematology care.  To afford each patient quality time with our provider, please arrive at least 15 minutes before your scheduled appointment time.   If you have a lab appointment with the Cancer Center please come in thru the  Main Entrance and check in at the main information desk  You need to re-schedule your appointment should you arrive 10 or more minutes late.  We strive to give you quality time with our providers, and arriving late affects you and other patients whose appointments are after yours.  Also, if you no show three or more times for appointments you may be dismissed from the clinic at the providers discretion.     Again, thank you for choosing [facility].  Our hope is that these requests will decrease the amount of time that you wait before being seen by our physicians.       _____________________________________________________________  Should you have questions after your visit to [facility], please contact our office at [phone] between the hours of 8:00 a.m. and 4:30 p.m.  Voicemails left after 4:00 p.m. will not be returned until the following business day.  For prescription refill requests, have your pharmacy contact our office and allow 72 hours.    Cancer Center Support Programs:   > Cancer Support Group  2nd Tuesday of the month 1pm-2pm, Journey Room    

## 2018-07-24 NOTE — Progress Notes (Signed)
[facility] [address] [place], [place] [address]   CLINIC:  Medical Oncology/Hematology  PCP:  Eartha InchBadger, Michael C, MD [address] [place] [phone]   REASON FOR VISIT: Follow-up for anemia  CURRENT THERAPY: oral iron supplements since march also intermittent iron infusions    INTERVAL HISTORY:  John Marsh 73 y.o. male returns for routine follow-up for anemia. He is here today with his wife. He is on continuous oxygen. He is still SOB. He is only able to walk short distances due to his breathing and his leg pain. He reports he had a slight increase in energy with his last iron infusion. Denies any nausea, vomiting, or diarrhea. Denies any new pains. Had not noticed any recent bleeding such as epistaxis, hematuria or hematochezia. Denies recent chest pain on exertion, pre-syncopal episodes, or palpitations. Denies any numbness or tingling in hands or feet. Denies any recent fevers, infections, or recent hospitalizations. Patient reports appetite at 0% and energy level at 0%.    REVIEW OF SYSTEMS:  Review of Systems  Constitutional: Positive for fatigue.  Gastrointestinal: Positive for constipation and diarrhea.  Psychiatric/Behavioral: Positive for sleep disturbance.  All other systems reviewed and are negative.    PAST MEDICAL/SURGICAL HISTORY:  Past Medical History:  Diagnosis Date  . Acute on chronic respiratory failure with hypoxia (HCC) 09/19/2016  . Anemia   . Atrial fibrillation (HCC)    Chadsvasc2=3  . Chest pain 06/30/2016  . Chronic diastolic HF (heart failure) (HCC)   . Chronic respiratory failure with hypoxia (HCC) 07/03/2015  . COPD (chronic obstructive pulmonary disease) (HCC)   . COPD exacerbation (HCC) 05/07/2017  . COPD with acute exacerbation (HCC) 07/03/2015  . Coronary artery disease due to lipid rich plaque   . Coronary heart disease   . Dyspnea 07/15/2017  . Elevated troponin 09/19/2016  . Emphysema lung (HCC)  03/04/2015  . Emphysema of lung (HCC)   . Hyperlipidemia   . Hypertension   . Iron deficiency anemia   . MI (myocardial infarction) (HCC)   . Multiple lung nodules on CT   . Nocturnal hypoxia   . Peripheral arterial disease (HCC)   . PVD (peripheral vascular disease) with claudication (HCC) 07/27/2015  . Respiratory failure (HCC) 05/02/2017  . Shortness of breath 06/03/2015  . Shortness of breath dyspnea   . Tachycardia 07/03/2015  . Typical atrial flutter [facility](HCC)    Past Surgical History:  Procedure Laterality Date  . CHOLECYSTECTOMY    . COLONOSCOPY WITH PROPOFOL N/A 07/19/2017   Procedure: COLONOSCOPY WITH PROPOFOL;  Surgeon: Rachael FeeJacobs, Daniel P, MD;  Location: [facility];  Service: Endoscopy;  Laterality: N/A;  . ESOPHAGOGASTRODUODENOSCOPY (EGD) WITH PROPOFOL N/A 07/19/2017   Procedure: ESOPHAGOGASTRODUODENOSCOPY (EGD) WITH PROPOFOL;  Surgeon: Rachael FeeJacobs, Daniel P, MD;  Location: [facility];  Service: Endoscopy;  Laterality: N/A;  . NASAL SEPTUM SURGERY    . PERIPHERAL VASCULAR CATHETERIZATION N/A 07/27/2015   Procedure: Abdominal Aortogram;  Surgeon: Chuck Hinthristopher S Dickson, MD;  [facility];  Service: Cardiovascular;  Laterality: N/A;  . PERIPHERAL VASCULAR CATHETERIZATION Bilateral 07/27/2015   Procedure: Lower Extremity Angiography;  Surgeon: Chuck Hinthristopher S Dickson, MD;  [facility];  Service: Cardiovascular;  Laterality: Bilateral;  . stent placed       SOCIAL HISTORY:  Social History   Socioeconomic History  . Marital status: Married    Spouse name: Not on file  . Number of children: Not on file  . Years  of education: Not on file  . Highest education level: Not on file  Occupational History  . Occupation: retired  Engineer, production  . Financial resource strain: Not on file  . Food insecurity:    Worry: Not on file    Inability: Not on file  . Transportation needs:    Medical: Not on file    Non-medical: Not on file  Tobacco Use  . Smoking  status: Current Every Day Smoker    Packs/day: 0.25    Years: 54.00    Pack years: 13.50    Types: Cigarettes    Start date: 08/01/1959  . Smokeless tobacco: Never Used  . Tobacco comment: smokes 4-5 cigerettes/day  Substance and Sexual Activity  . Alcohol use: No    Alcohol/week: 0.0 standard drinks  . Drug use: No  . Sexual activity: Never  Lifestyle  . Physical activity:    Days per week: Not on file    Minutes per session: Not on file  . Stress: Not on file  Relationships  . Social connections:    Talks on phone: Not on file    Gets together: Not on file    Attends religious service: Not on file    Active member of club or organization: Not on file    Attends meetings of clubs or organizations: Not on file    Relationship status: Not on file  . Intimate partner violence:    Fear of current or ex partner: Not on file    Emotionally abused: Not on file    Physically abused: Not on file    Forced sexual activity: Not on file  Other Topics Concern  . Not on file  Social History Narrative   Originally from [place]. Previously has lived in [place]. He moved to [facility] in 1964. He serve in [place]. 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 He has been to [place], [place], [place], several countries in [place], countries in [place], [place], & multiple [place] Countries. 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 He has a dog, 7 cats, & 2 horses. Remote exposure to a parrot for a few years in a different home. No mold exposure. No hot tub exposure. 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     FAMILY HISTORY:  Family History  Problem Relation Age of Onset  . Heart disease Mother        died at 64  . Heart disease Father   . Heart disease Sister        from rheumatic fever as a child  . Heart disease Paternal Grandfather     CURRENT MEDICATIONS:  Outpatient Encounter Medications as  of 07/24/2018  Medication Sig  . albuterol (PROVENTIL HFA;VENTOLIN HFA) 108 (90 Base) MCG/ACT inhaler Inhale 2 puffs into the lungs every 6 (six) hours as needed for wheezing or shortness of breath.  Marland Kitchen apixaban (ELIQUIS) 5 MG TABS tablet Take 1 tablet (5 mg total) by mouth 2 (two) times daily.  . budesonide (PULMICORT) 0.5 MG/2ML nebulizer solution Take 2 mLs (0.5 mg total) by nebulization 2 (two) times daily.  Marland Kitchen diltiazem (CARDIZEM CD) 360 MG 24 hr capsule Take 1 capsule (360 mg total) daily by mouth.  . feeding supplement, ENSURE ENLIVE, (ENSURE ENLIVE) LIQD Take 237 mLs by mouth 3 (three) times daily between meals.  . fluticasone (FLONASE) 50 MCG/ACT nasal spray Place 2  sprays into both nostrils daily as needed for allergies or rhinitis.  . formoterol (PERFOROMIST) 20 MCG/2ML nebulizer solution Take 2 mLs (20 mcg total) by nebulization 2 (two) times daily. DX code: J43.8  . gabapentin (NEURONTIN) 100 MG capsule Take 1 capsule (100 mg total) 2 (two) times daily by mouth.  Marland Kitchen. guaiFENesin (MUCINEX) 600 MG 12 hr tablet Take 1 tablet (600 mg total) by mouth 2 (two) times daily.  . mirtazapine (REMERON) 15 MG tablet Take 15 mg by mouth at bedtime.  . montelukast (SINGULAIR) 5 MG chewable tablet   . pantoprazole (PROTONIX) 40 MG tablet Take 1 tablet (40 mg total) by mouth daily.  . pramipexole (MIRAPEX) 0.125 MG tablet Take 1 tablet by mouth at bedtime.  . rosuvastatin (CRESTOR) 20 MG tablet Take 20 mg by mouth at bedtime.  . sodium chloride (OCEAN) 0.65 % SOLN nasal spray Place 1 spray as needed into both nostrils for congestion (nose irritation).  . tamsulosin (FLOMAX) 0.4 MG CAPS capsule   . Tiotropium Bromide Monohydrate (SPIRIVA RESPIMAT) 2.5 MCG/ACT AERS Inhale 2 puffs into the lungs daily.  Marland Kitchen. tiZANidine (ZANAFLEX) 4 MG tablet   . acetaminophen (TYLENOL) 325 MG tablet Take 650 mg by mouth every 6 (six) hours as needed for mild pain.  . nicotine (NICOTROL) 10 MG inhaler Inhale 1 Cartridge (1  continuous puffing total) into the lungs as needed for smoking cessation. (Patient not taking: Reported on 07/24/2018)  . nitroGLYCERIN (NITROSTAT) 0.4 MG SL tablet Place 0.4 mg under the tongue every 5 (five) minutes as needed for chest pain. x3 doses as needed for chest pain  . Spacer/Aero-Holding Chambers (AEROCHAMBER Z-STAT PLUS) inhaler Use as instructed (Patient not taking: Reported on 07/24/2018)   No facility-administered encounter medications on file as of 07/24/2018.     ALLERGIES:  No Known Allergies   PHYSICAL EXAM:  ECOG Performance status: 1  Vitals:   07/24/18 1202  BP: 113/88  Pulse: 70  Resp: (!) 22  Temp: 97.6 F (36.4 C)  SpO2: 100%   Filed Weights   07/24/18 1202  Weight: 144 lb (65.3 kg)    Physical Exam Constitutional:      Appearance: Normal appearance. He is normal weight.  Cardiovascular:     Rate and Rhythm: Normal rate and regular rhythm.     Heart sounds: Normal heart sounds.  Pulmonary:     Effort: Pulmonary effort is normal.     Breath sounds: Normal breath sounds.  Musculoskeletal: Normal range of motion.  Skin:    General: Skin is warm and dry.  Neurological:     Mental Status: He is alert and oriented to person, place, and time. Mental status is at baseline.  Psychiatric:        Mood and Affect: Mood normal.        Behavior: Behavior normal.        Thought Content: Thought content normal.        Judgment: Judgment normal.      LABORATORY DATA:  I have reviewed the labs as listed.  CBC    Component Value Date/Time   WBC 5.5 07/18/2018 1245   RBC 4.59 07/18/2018 1245   HGB 13.0 07/18/2018 1245   HGB 8.1 (L) 08/22/2017 1417   HCT 42.0 07/18/2018 1245   HCT 26.6 (L) 08/22/2017 1417   PLT 185 07/18/2018 1245   PLT 261 08/22/2017 1417   MCV 91.5 07/18/2018 1245   MCV 72 (L) 08/22/2017 1417   MCH 28.3 07/18/2018  1245   MCHC 31.0 07/18/2018 1245   RDW 12.8 07/18/2018 1245   RDW 16.6 (H) 08/22/2017 1417   LYMPHSABS 1.1 07/18/2018  1245   MONOABS 0.3 07/18/2018 1245   EOSABS 0.1 07/18/2018 1245   BASOSABS 0.1 07/18/2018 1245   CMP Latest Ref Rng & Units 07/18/2018 03/20/2018 10/03/2017  Glucose 70 - 99 mg/dL 412(I) 786(V) 672(C)  BUN 8 - 23 mg/dL 11 12 13   Creatinine 0.61 - 1.24 mg/dL 9.47 0.96 2.83  Sodium 135 - 145 mmol/L 140 140 139  Potassium 3.5 - 5.1 mmol/L 4.5 4.3 3.8  Chloride 98 - 111 mmol/L 105 102 104  CO2 22 - 32 mmol/L 24 28 23   Calcium 8.9 - 10.3 mg/dL 9.0 9.0 8.9  Total Protein 6.5 - 8.1 g/dL 7.6 6.9 -  Total Bilirubin 0.3 - 1.2 mg/dL 0.4 0.6 -  Alkaline Phos 38 - 126 U/L 92 94 -  AST 15 - 41 U/L 18 17 -  ALT 0 - 44 U/L 16 16 -       DIAGNOSTIC IMAGING:  I have independently reviewed the scans and discussed with the patient.   I have reviewed Mathis Bud, NP's note and agree with the documentation.  I personally performed a face-to-face visit, made revisions and my assessment and plan is as follows.    ASSESSMENT & PLAN:   Iron deficiency anemia 1.  Severe iron deficiency anemia: - EGD and colonoscopy on 07/19/2017 did not reveal any source of bleeding.  - Stool was positive for occult blood on 10/16/2017.  -He is currently on Eliquis for A. fib and Plavix for his coronary stents. -He tried taking iron pills in the past which caused constipation alternating with diarrhea. - He received Feraheme infusion on 03/27/2018 and 04/24/2018.  He felt better after the infusions. -He denies any bleeding per rectum or melena.  We reviewed his blood work.  Hemoglobin is 13 and ferritin is 57. -I have recommended 2 more infusions of Feraheme as maintenance as he has ongoing GI blood loss from blood thinners. -We will see him back in 4 months with repeat blood work.       Orders placed this encounter:  Orders Placed This Encounter  Procedures  . CBC with Differential/Platelet  . Comprehensive metabolic panel  . Ferritin  . Iron and TIBC  . Vitamin B12  . Folate      Doreatha Massed,  MD [facility] [phone]

## 2018-08-08 ENCOUNTER — Other Ambulatory Visit: Payer: Self-pay | Admitting: Acute Care

## 2018-08-08 DIAGNOSIS — Z87891 Personal history of nicotine dependence: Secondary | ICD-10-CM

## 2018-08-08 DIAGNOSIS — Z122 Encounter for screening for malignant neoplasm of respiratory organs: Secondary | ICD-10-CM

## 2018-08-14 ENCOUNTER — Ambulatory Visit (HOSPITAL_COMMUNITY): Payer: Medicare Other

## 2018-08-16 ENCOUNTER — Encounter (HOSPITAL_COMMUNITY): Payer: Self-pay

## 2018-08-16 ENCOUNTER — Inpatient Hospital Stay (HOSPITAL_COMMUNITY): Payer: Medicare Other

## 2018-08-16 VITALS — BP 125/47 | HR 67 | Temp 97.4°F | Resp 18

## 2018-08-16 DIAGNOSIS — D509 Iron deficiency anemia, unspecified: Secondary | ICD-10-CM | POA: Diagnosis not present

## 2018-08-16 MED ORDER — SODIUM CHLORIDE 0.9 % IV SOLN
Freq: Once | INTRAVENOUS | Status: AC
  Administered 2018-08-16: 13:00:00 via INTRAVENOUS

## 2018-08-16 MED ORDER — SODIUM CHLORIDE 0.9 % IV SOLN
510.0000 mg | Freq: Once | INTRAVENOUS | Status: AC
  Administered 2018-08-16: 510 mg via INTRAVENOUS
  Filled 2018-08-16: qty 17

## 2018-08-16 NOTE — Progress Notes (Signed)
Pt presents for Feraheme. VSS. No complaints of any changes since the last visit.  MAR reviewed and updated.    Feraheme given today per MD orders. Tolerated infusion without adverse affects. Vital signs stable. No complaints at this time. Discharged from clinic ambulatory. F/U with [facility] as scheduled.

## 2018-08-16 NOTE — Patient Instructions (Signed)
[facility]  Discharge Instructions:   _______________________________________________________________  Thank you for choosing [facility] at [facility] to provide your oncology and hematology care.  To afford each patient quality time with our providers, please arrive at least 15 minutes before your scheduled appointment.  You need to re-schedule your appointment if you arrive 10 or more minutes late.  We strive to give you quality time with our providers, and arriving late affects you and other patients whose appointments are after yours.  Also, if you no show three or more times for appointments you may be dismissed from the clinic.  Again, thank you for choosing [facility]. Our hope is that these requests will allow you access to exceptional care and in a timely manner. _______________________________________________________________  If you have questions after your visit, please contact our office at [phone] between the hours of 8:30 a.m. and 5:00 p.m. Voicemails left after 4:30 p.m. will not be returned until the following business day. _______________________________________________________________  For prescription refill requests, have your pharmacy contact our office. _______________________________________________________________  Recommendations made by the consultant and any test results will be sent to your referring physician. _______________________________________________________________ 

## 2018-08-22 ENCOUNTER — Ambulatory Visit: Payer: Medicare Other | Admitting: Interventional Cardiology

## 2018-08-28 ENCOUNTER — Ambulatory Visit: Payer: Medicare Other | Admitting: Interventional Cardiology

## 2018-08-28 NOTE — Progress Notes (Deleted)
Cardiology Office Note:    Date:  08/28/2018   ID:  SHAQWAN HOAK, DOB 11-27-1945, MRN [id number]  PCP:  Eartha Inch, MD  Cardiologist:  Lesleigh Noe, MD   Referring MD: Eartha Inch, MD   No chief complaint on file.   History of Present Illness:    John Marsh is a 73 y.o. male with a hx of  COPD on oxygen (2 L rest, 3 w/exertion), AFon xarelto, CAD(remote stenting 2007 - further details unknown),tobaco abuse, pulmonary nodules by CT, DM,GERD,PAD(details unknown),&HTN.  Past Medical History:  Diagnosis Date  . Acute on chronic respiratory failure with hypoxia (HCC) 09/19/2016  . Anemia   . Atrial fibrillation (HCC)    Chadsvasc2=3  . Chest pain 06/30/2016  . Chronic diastolic HF (heart failure) (HCC)   . Chronic respiratory failure with hypoxia (HCC) 07/03/2015  . COPD (chronic obstructive pulmonary disease) (HCC)   . COPD exacerbation (HCC) 05/07/2017  . COPD with acute exacerbation (HCC) 07/03/2015  . Coronary artery disease due to lipid rich plaque   . Coronary heart disease   . Dyspnea 07/15/2017  . Elevated troponin 09/19/2016  . Emphysema lung (HCC) 03/04/2015  . Emphysema of lung (HCC)   . Hyperlipidemia   . Hypertension   . Iron deficiency anemia   . MI (myocardial infarction) (HCC)   . Multiple lung nodules on CT   . Nocturnal hypoxia   . Peripheral arterial disease (HCC)   . PVD (peripheral vascular disease) with claudication (HCC) 07/27/2015  . Respiratory failure (HCC) 05/02/2017  . Shortness of breath 06/03/2015  . Shortness of breath dyspnea   . Tachycardia 07/03/2015  . Typical atrial flutter [facility])     Past Surgical History:  Procedure Laterality Date  . CHOLECYSTECTOMY    . COLONOSCOPY WITH PROPOFOL N/A 07/19/2017   Procedure: COLONOSCOPY WITH PROPOFOL;  Surgeon: Rachael Fee, MD;  [facility]: [facility];  Service: Endoscopy;  Laterality: N/A;  . ESOPHAGOGASTRODUODENOSCOPY (EGD) WITH PROPOFOL N/A 07/19/2017   Procedure:  ESOPHAGOGASTRODUODENOSCOPY (EGD) WITH PROPOFOL;  Surgeon: Rachael Fee, MD;  [facility]: [facility];  Service: Endoscopy;  Laterality: N/A;  . NASAL SEPTUM SURGERY    . PERIPHERAL VASCULAR CATHETERIZATION N/A 07/27/2015   Procedure: Abdominal Aortogram;  Surgeon: Chuck Hint, MD;  Location: [facility] INVASIVE CV LAB;  Service: Cardiovascular;  Laterality: N/A;  . PERIPHERAL VASCULAR CATHETERIZATION Bilateral 07/27/2015   Procedure: Lower Extremity Angiography;  Surgeon: Chuck Hint, MD;  Location: [facility] INVASIVE CV LAB;  Service: Cardiovascular;  Laterality: Bilateral;  . stent placed      Current Medications: No outpatient medications have been marked as taking for the 08/28/18 encounter (Appointment) with Lyn Records, MD.     Allergies:   Patient has no known allergies.   Social History   Socioeconomic History  . Marital status: Married    Spouse name: Not on file  . Number of children: Not on file  . Years of education: Not on file  . Highest education level: Not on file  Occupational History  . Occupation: retired  Engineer, production  . Financial resource strain: Not on file  . Food insecurity:    Worry: Not on file    Inability: Not on file  . Transportation needs:    Medical: Not on file    Non-medical: Not on file  Tobacco Use  . Smoking status: Current Every Day Smoker    Packs/day: 0.25    Years: 54.00  Pack years: 13.50    Types: Cigarettes    Start date: 08/01/1959  . Smokeless tobacco: Never Used  . Tobacco comment: smokes 4-5 cigerettes/day  Substance and Sexual Activity  . Alcohol use: No    Alcohol/week: 0.0 standard drinks  . Drug use: No  . Sexual activity: Never  Lifestyle  . Physical activity:    Days per week: Not on file    Minutes per session: Not on file  . Stress: Not on file  Relationships  . Social connections:    Talks on phone: Not on file    Gets together: Not on file    Attends religious service: Not on file    Active  member of club or organization: Not on file    Attends meetings of clubs or organizations: Not on file    Relationship status: Not on file  Other Topics Concern  . Not on file  Social History Narrative   Originally from [place]. Previously has lived in [place]. He moved to [facility] in [place]. He serve in [place]. 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 He has been to [place], [place], [place], several countries in [place], countries in [place], [place], & multiple [place] Count[place]. 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 He has a dog, 7 cats, & 2 horses. Remote exposure to a parrot for a few years in a different home. No mold exposure. No hot tub exposure. 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      Family History: The patient's family history includes Heart disease in his father, mother, paternal grandfather, and sister.  ROS:   Please see the history of present illness.    *** All other systems reviewed and are negative.  EKGs/Labs/Other Studies Reviewed:    The following studies were reviewed today: ***  EKG:  EKG ***  Recent Labs: 09/05/2017: TSH 1.391 07/18/2018: ALT 16; BUN 11; Creatinine, Ser 0.95; Hemoglobin 13.0; Platelets 185; Potassium 4.5; Sodium 140  Recent Lipid Panel    Component Value Date/Time   CHOL 119 07/15/2017 0505   TRIG 39 07/15/2017 0505   HDL 52 07/15/2017 0505   CHOLHDL 2.3 07/15/2017 0505   VLDL 8 07/15/2017 0505   LDLCALC 59 07/15/2017 0505    Physical Exam:    VS:  There were no vitals taken for this visit.    Wt Readings from Last 3 Encounters:  07/24/18 144 lb (65.3 kg)  03/21/18 143 lb (64.9 kg)  03/20/18 142 lb 6.4 oz (64.6 kg)     GEN: ***. No acute distress HEENT: Normal NECK: No JVD. LYMPHATICS: No lymphadenopathy CARDIAC: ***RRR.  *** murmur, ***gallop, ***edema VASCULAR: *** Pulses, *** bruits RESPIRATORY:  Clear to auscultation  without rales, wheezing or rhonchi  ABDOMEN: Soft, non-tender, non-distended, No pulsatile mass, MUSCULOSKELETAL: No deformity  SKIN: Warm and dry NEUROLOGIC:  Alert and oriented x 3 PSYCHIATRIC:  Normal affect   ASSESSMENT:    1. Coronary artery disease involving native coronary artery of native heart without angina pectoris   2. Typical atrial flutter (HCC)   3. Chronic diastolic HF (heart failure) (HCC)   4. Other emphysema (HCC)   5. Diabetes mellitus without complication (HCC)   6. Essential hypertension   7. PVD (peripheral vascular disease) with claudication (HCC)    PLAN:    In order of problems listed above:  1. ***  Medication Adjustments/Labs and Tests Ordered: Current medicines are reviewed at length with the patient today.  Concerns regarding medicines are outlined above.  No orders of the defined types were placed in this encounter.  No orders of the defined types were placed in this encounter.   There are no Patient Instructions on file for this visit.   Signed, Lesleigh Noe, MD  08/28/2018 8:47 AM    [facility]

## 2018-09-11 ENCOUNTER — Other Ambulatory Visit: Payer: Self-pay

## 2018-09-11 ENCOUNTER — Encounter (HOSPITAL_COMMUNITY): Payer: Self-pay

## 2018-09-11 ENCOUNTER — Ambulatory Visit: Payer: Medicare Other

## 2018-09-11 ENCOUNTER — Inpatient Hospital Stay (HOSPITAL_COMMUNITY): Payer: Medicare Other | Attending: Hematology

## 2018-09-11 ENCOUNTER — Encounter: Payer: Medicare Other | Admitting: Acute Care

## 2018-09-11 VITALS — BP 115/55 | HR 67 | Temp 98.0°F | Resp 18

## 2018-09-11 DIAGNOSIS — D509 Iron deficiency anemia, unspecified: Secondary | ICD-10-CM

## 2018-09-11 MED ORDER — SODIUM CHLORIDE 0.9 % IV SOLN
Freq: Once | INTRAVENOUS | Status: AC
  Administered 2018-09-11: 09:00:00 via INTRAVENOUS

## 2018-09-11 MED ORDER — SODIUM CHLORIDE 0.9 % IV SOLN
510.0000 mg | Freq: Once | INTRAVENOUS | Status: AC
  Administered 2018-09-11: 510 mg via INTRAVENOUS
  Filled 2018-09-11: qty 510

## 2018-09-11 NOTE — Patient Instructions (Signed)

## 2018-09-11 NOTE — Progress Notes (Signed)
Patient tolerated iron infusion with no complaints voiced.  Peripheral IV site clean and dry with good blood return noted before and after treatment.  VSS with discharge and left by wheelchair.  No s/s of distress noted.

## 2018-10-10 ENCOUNTER — Telehealth: Payer: Self-pay | Admitting: Interventional Cardiology

## 2018-10-10 NOTE — Telephone Encounter (Signed)
lmom to call back regarding appt for 5/1 Dr Katrinka Blazing

## 2018-10-14 NOTE — Telephone Encounter (Signed)
Patient states he is feeling fine , if he has any problems he will call back to get an virtual appt, but as of right now he will like to wait until the office opens back up   Patient declined mychart at this time

## 2018-10-18 ENCOUNTER — Ambulatory Visit: Payer: Medicare Other | Admitting: Interventional Cardiology

## 2018-10-18 ENCOUNTER — Telehealth: Payer: Self-pay | Admitting: Physician Assistant

## 2018-10-18 NOTE — Telephone Encounter (Signed)
Spoke with patient who confirmed all demographics. Patient has a smart phone.  He is not interested in My Chart. Will have vitals ready for visit.    Virtual Visit Pre-Appointment Phone Call  "(Name), I am calling you today to discuss your upcoming appointment. We are currently trying to limit exposure to the virus that causes COVID-19 by seeing patients at home rather than in the office."  1. "What is the BEST phone number to call the day of the visit?" - include this in appointment notes  2. Do you have or have access to (through a family member/friend) a smartphone with video capability that we can use for your visit?" a. If yes - list this number in appt notes as cell (if different from BEST phone #) and list the appointment type as a VIDEO visit in appointment notes b. If no - list the appointment type as a PHONE visit in appointment notes  Confirm consent - "In the setting of the current Covid19 crisis, you are scheduled for a (phone or video) visit with your provider on (date) at (time).  Just as we do with many in-office visits, in order for you to participate in this visit, we must obtain consent.  If you'd like, I can send this to your mychart (if signed up) or email for you to review.  Otherwise, I can obtain your verbal consent now.  All virtual visits are billed to your insurance company just like a normal visit would be.  By agreeing to a virtual visit, we'd like you to understand that the technology does not allow for your provider to perform an examination, and thus may limit your provider's ability to fully assess your condition. If your provider identifies any concerns that need to be evaluated in person, we will make arrangements to do so.  Finally, though the technology is pretty good, we cannot assure that it will always work on either your or our end, and in the setting of a video visit, we may have to convert it to a phone-only visit.  In either situation, we cannot ensure  that we have a secure connection.  Are you willing to proceed?"  Patient said "yes".  3. Advise patient to be prepared - "Two hours prior to your appointment, go ahead and check your blood pressure, pulse, oxygen saturation, and your weight (if you have the equipment to check those) and write them all down. When your visit starts, your provider will ask you for this information. If you have an Apple Watch or Kardia device, please plan to have heart rate information ready on the day of your appointment. Please have a pen and paper handy nearby the day of the visit as well."  4. Give patient instructions for MyChart download to smartphone OR Doximity/Doxy.me as below if video visit (depending on what platform provider is using)  5. Inform patient they will receive a phone call 15 minutes prior to their appointment time (may be from unknown caller ID) so they should be prepared to answer    TELEPHONE CALL NOTE  John Marsh has been deemed a candidate for a follow-up tele-health visit to limit community exposure during the Covid-19 pandemic. I spoke with the patient via phone to ensure availability of phone/video source, confirm preferred email & phone number, and discuss instructions and expectations.  I reminded John HeckHugh T Karstens to be prepared with any vital sign and/or heart rhythm information that could potentially be obtained via home monitoring, at the  time of his visit. I reminded MARKIESE MAILE to expect a phone call prior to his visit.  Royann Shivers 10/18/2018 2:50 PM   INSTRUCTIONS FOR DOWNLOADING THE MYCHART APP TO SMARTPHONE  - The patient must first make sure to have activated MyChart and know their login information - If Apple, go to [employer] and type in MyChart in the search bar and download the app. If Android, ask patient to go to [employer] and type in [place] in the search bar and download the app. The app is free but as with any other app downloads, their phone may require  them to verify saved payment information or Apple/Android password.  - The patient will need to then log into the app with their MyChart username and password, and select [facility] as their healthcare provider to link the account. When it is time for your visit, go to the MyChart app, find appointments, and click Begin Video Visit. Be sure to Select Allow for your device to access the Microphone and Camera for your visit. You will then be connected, and your provider will be with you shortly.  **If they have any issues connecting, or need assistance please contact MyChart service desk [phone])**  **If using a computer, in order to ensure the best quality for their visit they will need to use either of the following Internet Browsers: [employer] or Google Chrome**  IF USING DOXIMITY or DOXY.ME - The patient will receive a link just prior to their visit by text.     FULL LENGTH CONSENT FOR TELE-HEALTH VISIT   I hereby voluntarily request, consent and authorize [facility] and its employed or contracted physicians, physician assistants, nurse practitioners or other licensed health care professionals (the Practitioner), to provide me with telemedicine health care services (the Services") as deemed necessary by the treating Practitioner. I acknowledge and consent to receive the Services by the Practitioner via telemedicine. I understand that the telemedicine visit will involve communicating with the Practitioner through live audiovisual communication technology and the disclosure of certain medical information by electronic transmission. I acknowledge that I have been given the opportunity to request an in-person assessment or other available alternative prior to the telemedicine visit and am voluntarily participating in the telemedicine visit.  I understand that I have the right to withhold or withdraw my consent to the use of telemedicine in the course of my care at any  time, without affecting my right to future care or treatment, and that the Practitioner or I may terminate the telemedicine visit at any time. I understand that I have the right to inspect all information obtained and/or recorded in the course of the telemedicine visit and may receive copies of available information for a reasonable fee.  I understand that some of the potential risks of receiving the Services via telemedicine include:   Delay or interruption in medical evaluation due to technological equipment failure or disruption;  Information transmitted may not be sufficient (e.g. poor resolution of images) to allow for appropriate medical decision making by the Practitioner; and/or   In rare instances, security protocols could fail, causing a breach of personal health information.  Furthermore, I acknowledge that it is my responsibility to provide information about my medical history, conditions and care that is complete and accurate to the best of my ability. I acknowledge that Practitioner's advice, recommendations, and/or decision may be based on factors not within their control, such as incomplete or inaccurate data provided  by me or distortions of diagnostic images or specimens that may result from electronic transmissions. I understand that the practice of medicine is not an exact science and that Practitioner makes no warranties or guarantees regarding treatment outcomes. I acknowledge that I will receive a copy of this consent concurrently upon execution via email to the email address I last provided but may also request a printed copy by calling the office of [place].    I understand that my insurance will be billed for this visit.   I have read or had this consent read to me.  I understand the contents of this consent, which adequately explains the benefits and risks of the Services being provided via telemedicine.   I have been provided ample opportunity to ask questions  regarding this consent and the Services and have had my questions answered to my satisfaction.  I give my informed consent for the services to be provided through the use of telemedicine in my medical care  By participating in this telemedicine visit I agree to the above.

## 2018-10-29 ENCOUNTER — Encounter: Payer: Self-pay | Admitting: *Deleted

## 2018-10-29 ENCOUNTER — Telehealth (INDEPENDENT_AMBULATORY_CARE_PROVIDER_SITE_OTHER): Payer: Medicare Other | Admitting: Physician Assistant

## 2018-10-29 ENCOUNTER — Encounter: Payer: Self-pay | Admitting: Physician Assistant

## 2018-10-29 ENCOUNTER — Other Ambulatory Visit: Payer: Self-pay

## 2018-10-29 VITALS — BP 169/76 | HR 80 | Ht 70.0 in | Wt 146.0 lb

## 2018-10-29 DIAGNOSIS — I10 Essential (primary) hypertension: Secondary | ICD-10-CM

## 2018-10-29 DIAGNOSIS — I25110 Atherosclerotic heart disease of native coronary artery with unstable angina pectoris: Secondary | ICD-10-CM | POA: Diagnosis not present

## 2018-10-29 DIAGNOSIS — I480 Paroxysmal atrial fibrillation: Secondary | ICD-10-CM

## 2018-10-29 DIAGNOSIS — I739 Peripheral vascular disease, unspecified: Secondary | ICD-10-CM

## 2018-10-29 DIAGNOSIS — Z7901 Long term (current) use of anticoagulants: Secondary | ICD-10-CM

## 2018-10-29 DIAGNOSIS — R079 Chest pain, unspecified: Secondary | ICD-10-CM

## 2018-10-29 MED ORDER — ISOSORBIDE MONONITRATE ER 30 MG PO TB24: 30.0000 mg | ORAL_TABLET | Freq: Every day | ORAL | 3 refills | Status: DC

## 2018-10-29 NOTE — Progress Notes (Signed)
Virtual Visit via Video Note   This visit type was conducted due to national recommendations for restrictions regarding the COVID-19 Pandemic (e.g. social distancing) in an effort to limit this patient's exposure and mitigate transmission in our community.  Due to his co-morbid illnesses, this patient is at least at moderate risk for complications without adequate follow up.  This format is felt to be most appropriate for this patient at this time.  All issues noted in this document were discussed and addressed.  A limited physical exam was performed with this format.  Please refer to the patient's chart for his consent to telehealth for [facility].   Date:  10/29/2018   ID:  John Marsh, DOB 1945-12-01, MRN [id number]  Patient Location: Home Provider Location: Home  PCP:  Eartha InchBadger, Michael C, MD  Cardiologist:  Lesleigh NoeHenry W Smith III, MD  Pulmonologist: Dr. Kendrick FriesMcQuaid  Evaluation Performed:  Follow-Up Visit  Chief Complaint:  6 months follow up  History of Present Illness:    John Marsh is a 73 y.o. male with hx of AFon xarelto, CAD(remote stenting 2007 - further details unknown),PAD (followed by Dr. Arbie CookeyEarly), DM, HTN COPD, chronic respiratory failure with hypoxemia on 2L oxygen and ongoing smoking seen for follow up.   Last echo 06/2017 showed LVEF of 60-65% and mild aortic stenosis.   Patient has chronic dyspnea due to COPD and uses 2 L oxygen 24/7.  He needs higher oxygen with activity.  For the past 2 to 3 months, patient has noted substernal dull achy pain occurring multiple times per week.  Last for approximately 5 minutes.  Occasionally required sublingual nitroglycerin otherwise resolved itself.  He has noted pain going to his back intermittently.  He cannot recall his symptoms when he had stenting in 2007.  However, states that he had a constant pain at that time.  He also noted intermittent elevation of blood pressure ranging 130-170s over 80s.  He denies palpitation, orthopnea,  PND, lower extremity edema or melena.  The patient does not have symptoms concerning for COVID-19 infection (fever, chills, cough, or new shortness of breath).    Past Medical History:  Diagnosis Date  . Acute on chronic respiratory failure with hypoxia (HCC) 09/19/2016  . Anemia   . Atrial fibrillation (HCC)    Chadsvasc2=3  . Chest pain 06/30/2016  . Chronic diastolic HF (heart failure) (HCC)   . Chronic respiratory failure with hypoxia (HCC) 07/03/2015  . COPD (chronic obstructive pulmonary disease) (HCC)   . COPD exacerbation (HCC) 05/07/2017  . COPD with acute exacerbation (HCC) 07/03/2015  . Coronary artery disease due to lipid rich plaque   . Coronary heart disease   . Dyspnea 07/15/2017  . Elevated troponin 09/19/2016  . Emphysema lung (HCC) 03/04/2015  . Emphysema of lung (HCC)   . Hyperlipidemia   . Hypertension   . Iron deficiency anemia   . MI (myocardial infarction) (HCC)   . Multiple lung nodules on CT   . Nocturnal hypoxia   . Peripheral arterial disease (HCC)   . PVD (peripheral vascular disease) with claudication (HCC) 07/27/2015  . Respiratory failure (HCC) 05/02/2017  . Shortness of breath 06/03/2015  . Shortness of breath dyspnea   . Tachycardia 07/03/2015  . Typical atrial flutter [facility](HCC)    Past Surgical History:  Procedure Laterality Date  . CHOLECYSTECTOMY    . COLONOSCOPY WITH PROPOFOL N/A 07/19/2017   Procedure: COLONOSCOPY WITH PROPOFOL;  Surgeon: Rachael FeeJacobs, Daniel P, MD;  Location: [facility];  Service: Endoscopy;  Laterality: N/A;  . ESOPHAGOGASTRODUODENOSCOPY (EGD) WITH PROPOFOL N/A 07/19/2017   Procedure: ESOPHAGOGASTRODUODENOSCOPY (EGD) WITH PROPOFOL;  Surgeon: Rachael Fee, MD;  Location: [facility];  Service: Endoscopy;  Laterality: N/A;  . NASAL SEPTUM SURGERY    . PERIPHERAL VASCULAR CATHETERIZATION N/A 07/27/2015   Procedure: Abdominal Aortogram;  Surgeon: Chuck Hint, MD;  Location: [facility] INVASIVE CV LAB;  Service: Cardiovascular;   Laterality: N/A;  . PERIPHERAL VASCULAR CATHETERIZATION Bilateral 07/27/2015   Procedure: Lower Extremity Angiography;  Surgeon: Chuck Hint, MD;  Location[facility] INVASIVE CV LAB;  Service: Cardiovascular;  Laterality: Bilateral;  . stent placed       Current Meds  Medication Sig  . acetaminophen (TYLENOL) 325 MG tablet Take 650 mg by mouth every 6 (six) hours as needed for mild pain.  Marland Kitchen albuterol (PROVENTIL HFA;VENTOLIN HFA) 108 (90 Base) MCG/ACT inhaler Inhale 2 puffs into the lungs every 6 (six) hours as needed for wheezing or shortness of breath.  Marland Kitchen apixaban (ELIQUIS) 5 MG TABS tablet Take 1 tablet (5 mg total) by mouth 2 (two) times daily.  . budesonide (PULMICORT) 0.5 MG/2ML nebulizer solution Take 2 mLs (0.5 mg total) by nebulization 2 (two) times daily.  Marland Kitchen diltiazem (CARDIZEM CD) 360 MG 24 hr capsule Take 1 capsule (360 mg total) daily by mouth.  . feeding supplement, ENSURE ENLIVE, (ENSURE ENLIVE) LIQD Take 237 mLs by mouth 3 (three) times daily between meals.  . fluticasone (FLONASE) 50 MCG/ACT nasal spray Place 2 sprays into both nostrils daily as needed for allergies or rhinitis.  . formoterol (PERFOROMIST) 20 MCG/2ML nebulizer solution Take 2 mLs (20 mcg total) by nebulization 2 (two) times daily. DX code: J43.8  . gabapentin (NEURONTIN) 100 MG capsule Take 1 capsule (100 mg total) 2 (two) times daily by mouth.  . mirtazapine (REMERON) 15 MG tablet Take 15 mg by mouth at bedtime.  . nitroGLYCERIN (NITROSTAT) 0.4 MG SL tablet Place 0.4 mg under the tongue every 5 (five) minutes as needed for chest pain. x3 doses as needed for chest pain  . pantoprazole (PROTONIX) 40 MG tablet Take 1 tablet (40 mg total) by mouth daily.  . pramipexole (MIRAPEX) 0.125 MG tablet Take 1 tablet by mouth at bedtime.  . rosuvastatin (CRESTOR) 20 MG tablet Take 20 mg by mouth at bedtime.  . sodium chloride (OCEAN) 0.65 % SOLN nasal spray Place 1 spray as needed into both nostrils for congestion (nose  irritation).  . Spacer/Aero-Holding Chambers (AEROCHAMBER Z-STAT PLUS) inhaler Use as instructed  . tamsulosin (FLOMAX) 0.4 MG CAPS capsule Take 0.4 mg by mouth daily.   . Tiotropium Bromide Monohydrate (SPIRIVA RESPIMAT) 2.5 MCG/ACT AERS Inhale 2 puffs into the lungs daily.  Marland Kitchen tiZANidine (ZANAFLEX) 4 MG tablet Take 4 mg by mouth 3 (three) times daily.      Allergies:   Patient has no known allergies.   Social History   Tobacco Use  . Smoking status: Current Every Day Smoker    Packs/day: 0.25    Years: 54.00    Pack years: 13.50    Types: Cigarettes    Start date: 08/01/1959  . Smokeless tobacco: Never Used  . Tobacco comment: smokes 4-5 cigerettes/day  Substance Use Topics  . Alcohol use: No    Alcohol/week: 0.0 standard drinks  . Drug use: No     Family Hx: The patient's family history includes Heart disease in his father, mother, paternal grandfather, and sister.  ROS:   Please see the  history of present illness.    All other systems reviewed and are negative.   Prior CV studies:   The following studies were reviewed today:  As summarized above  Labs/Other Tests and Data Reviewed:    EKG:  No ECG reviewed.  Recent Labs: 07/18/2018: ALT 16; BUN 11; Creatinine, Ser 0.95; Hemoglobin 13.0; Platelets 185; Potassium 4.5; Sodium 140   Recent Lipid Panel Lab Results  Component Value Date/Time   CHOL 119 07/15/2017 05:05 AM   TRIG 39 07/15/2017 05:05 AM   HDL 52 07/15/2017 05:05 AM   CHOLHDL 2.3 07/15/2017 05:05 AM   LDLCALC 59 07/15/2017 05:05 AM    Wt Readings from Last 3 Encounters:  10/29/18 146 lb (66.2 kg)  07/24/18 144 lb (65.3 kg)  03/21/18 143 lb (64.9 kg)     Objective:    Vital Signs:  BP (!) 169/76   Pulse 80   Ht 5\' 10"  (1.778 m)   Wt 146 lb (66.2 kg)   BMI 20.95 kg/m    VITAL SIGNS:  reviewed GEN:  no acute distress EYES:  sclerae anicteric, EOMI - Extraocular Movements Intact RESPIRATORY:  normal respiratory effort, symmetric expansion  CARDIOVASCULAR:  no peripheral edema SKIN:  no rash, lesions or ulcers. MUSCULOSKELETAL:  no obvious deformities. NEURO:  alert and oriented x 3, no obvious focal deficit PSYCH:  normal affect  ASSESSMENT & PLAN:    1. Chest pain  -Ongoing for past 2 to 3 months.  Described as a dull achy substernal pain radiating to his back intermittently.  Also has fluctuating systolic blood pressure in 130s to 170s.  He cannot recall his prior anginal symptoms when had stenting in 2007.  Differential includes angina, arrhythmia (he denies palpitation associated with chest pain), uncontrolled blood pressure or aortic dissection.  However occasionally he requires sublingual nitroglycerin with improvement of his pain.  After long discussion, decided to proceed with [facility].  Add Imdur 30 mg daily. Consider CT of her aorta if inconclusive to r/o dissection.  He will give [place] a call if worsening of his pain.  2. Atrial fibrillation  -Patient denies palpitation or bleeding issue.  Continue Cardizem and Xarelto.  3. HTN -Elevated as summarized above.  Continue Cardizem at current dose.  Add Imdur as above.  He will keep log of his blood pressure and give [place] a call next week.  4. PAD - Followed by Dr. Arbie Cookey   5. COPD/Chronic respiratory failure with hypoxia - On oxygen  6.  CAD -As above.  COVID-19 Education: The signs and symptoms of COVID-19 were discussed with the patient and how to seek care for testing (follow up with PCP or arrange E-visit).  The importance of social distancing was discussed today.  Time:   Today, I have spent 25 minutes with the patient with telehealth technology discussing the above problems.     Medication Adjustments/Labs and Tests Ordered: Current medicines are reviewed at length with the patient today.  Concerns regarding medicines are outlined above.   Tests Ordered: No orders of the defined types were placed in this encounter.   Medication Changes: No  orders of the defined types were placed in this encounter.   Disposition:  Follow up in 3 week(s)  Signed, Manson Passey, PA  10/29/2018 8:27 AM    [facility]

## 2018-10-29 NOTE — Patient Instructions (Signed)
Medication Instructions:  Your physician has recommended you make the following change in your medication:  1.  START Imdur 30 mg taking 1 tablet daily  If you need a refill on your cardiac medications before your next appointment, please call your pharmacy.   Lab work: None ordered  If you have labs (blood work) drawn today and your tests are completely normal, you will receive your results only by: Marland Kitchen. MyChart Message (if you have MyChart) OR . A paper copy in the mail If you have any lab test that is abnormal or we need to change your treatment, we will call you to review the results.  Testing/Procedures: Your physician has requested that you have a lexiscan myoview. For further information please visit [email or web address]. Please follow instruction sheet, as given.    Follow-Up: At [facility], you and your health needs are our priority.  As part of our continuing mission to provide you with exceptional heart care, we have created designated Provider Care Teams.  These Care Teams include your primary Cardiologist (physician) and Advanced Practice Providers (APPs -  Physician Assistants and Nurse Practitioners) who all work together to provide you with the care you need, when you need it. You will need a follow up appointment in 2 weeks.  Please call our office 2 months in advance to schedule this appointment.  You may see Lesleigh NoeHenry W Smith III, MD 2 WEEKS AFTER STRESS TEST  Any Other Special Instructions Will Be Listed Below (If Applicable).  Cardiac Nuclear Scan A cardiac nuclear scan is a test that is done to check the flow of blood to your heart. It is done when you are resting and when you are exercising. The test looks for problems such as:  Not enough blood reaching a portion of the heart.  The heart muscle not working as it should. You may need this test if:  You have heart disease.  You have had lab results that are not normal.  You have had heart surgery or a balloon  procedure to open up blocked arteries (angioplasty).  You have chest pain.  You have shortness of breath. In this test, a special dye (tracer) is put into your bloodstream. The tracer will travel to your heart. A camera will then take pictures of your heart to see how the tracer moves through your heart. This test is usually done at a hospital and takes 2-4 hours. Tell a doctor about:  Any allergies you have.  All medicines you are taking, including vitamins, herbs, eye drops, creams, and over-the-counter medicines.  Any problems you or family members have had with anesthetic medicines.  Any blood disorders you have.  Any surgeries you have had.  Any medical conditions you have.  Whether you are pregnant or may be pregnant. What are the risks? Generally, this is a safe test. However, problems may occur, such as:  Serious chest pain and heart attack. This is only a risk if the stress portion of the test is done.  Rapid heartbeat.  A feeling of warmth in your chest. This feeling usually does not last long.  Allergic reaction to the tracer. What happens before the test?  Ask your doctor about changing or stopping your normal medicines. This is important.  Follow instructions from your doctor about what you cannot eat or drink.  Remove your jewelry on the day of the test. What happens during the test?  An IV tube will be inserted into one of your veins.  Your  doctor will give you a small amount of tracer through the IV tube.  You will wait for 20-40 minutes while the tracer moves through your bloodstream.  Your heart will be monitored with an electrocardiogram (ECG).  You will lie down on an exam table.  Pictures of your heart will be taken for about 15-20 minutes.  You may also have a stress test. For this test, one of these things may be done: ? You will be asked to exercise on a treadmill or a stationary bike. ? You will be given medicines that will make your  heart work harder. This is done if you are unable to exercise.  When blood flow to your heart has peaked, a tracer will again be given through the IV tube.  After 20-40 minutes, you will get back on the exam table. More pictures will be taken of your heart.  Depending on the tracer that is used, more pictures may need to be taken 3-4 hours later.  Your IV tube will be removed when the test is over. The test may vary among doctors and hospitals. What happens after the test?  Ask your doctor: ? Whether you can return to your normal schedule, including diet, activities, and medicines. ? Whether you should drink more fluids. This will help to remove the tracer from your body. Drink enough fluid to keep your pee (urine) pale yellow.  Ask your doctor, or the department that is doing the test: ? When will my results be ready? ? How will I get my results? Summary  A cardiac nuclear scan is a test that is done to check the flow of blood to your heart.  Tell your doctor whether you are pregnant or may be pregnant.  Before the test, ask your doctor about changing or stopping your normal medicines. This is important.  Ask your doctor whether you can return to your normal activities. You may be asked to drink more fluids. This information is not intended to replace advice given to you by your health care provider. Make sure you discuss any questions you have with your health care provider. Document Released: 11/19/2017 Document Revised: 11/19/2017 Document Reviewed: 11/19/2017 Elsevier Interactive Patient Education  2019 [employer].

## 2018-10-30 ENCOUNTER — Telehealth (HOSPITAL_COMMUNITY): Payer: Self-pay | Admitting: *Deleted

## 2018-10-30 NOTE — Telephone Encounter (Signed)
Patient given detailed instructions per Myocardial Perfusion Study Information Sheet for the test on 11/01/18 at 10:30. Patient notified to arrive 15 minutes early and that it is imperative to arrive on time for appointment to keep from having the test rescheduled. If you need to cancel or reschedule your appointment, please call the office within 24 hours of your appointment. . Patient verbalized understanding.John Marsh

## 2018-11-01 ENCOUNTER — Encounter (HOSPITAL_COMMUNITY): Payer: Medicare Other

## 2018-11-08 ENCOUNTER — Encounter (HOSPITAL_COMMUNITY): Payer: Medicare Other

## 2018-11-12 ENCOUNTER — Telehealth: Payer: Self-pay

## 2018-11-12 NOTE — Telephone Encounter (Signed)
YOUR CARDIOLOGY TEAM HAS ARRANGED FOR AN E-VISIT FOR YOUR APPOINTMENT - PLEASE REVIEW IMPORTANT INFORMATION BELOW SEVERAL DAYS PRIOR TO YOUR APPOINTMENT  Due to the recent COVID-19 pandemic, we are transitioning in-person office visits to tele-medicine visits in an effort to decrease unnecessary exposure to our patients, their families, and staff. These visits are billed to your insurance just like a normal visit is. We also encourage you to sign up for MyChart if you have not already done so. You will need a smartphone if possible. For patients that do not have this, we can still complete the visit using a regular telephone but do prefer a smartphone to enable video when possible. You may have a family member that lives with you that can help. If possible, we also ask that you have a blood pressure cuff and scale at home to measure your blood pressure, heart rate and weight prior to your scheduled appointment. Patients with clinical needs that need an in-person evaluation and testing will still be able to come to the office if absolutely necessary. If you have any questions, feel free to call our office.     YOUR PROVIDER WILL BE USING THE FOLLOWING PLATFORM TO COMPLETE YOUR VISIT: Doximity  . IF USING MYCHART - How to Download the MyChart App to Your SmartPhone   - If Apple, go to App Store and type in MyChart in the search bar and download the app. If Android, ask patient to go to Google Play Store and type in MyChart in the search bar and download the app. The app is free but as with any other app downloads, your phone may require you to verify saved payment information or Apple/Android password.  - You will need to then log into the app with your MyChart username and password, and select [place] as your healthcare provider to link the account.  - When it is time for your visit, go to the MyChart app, find appointments, and click Begin Video Visit. Be sure to Select Allow for your device to  access the Microphone and Camera for your visit. You will then be connected, and your provider will be with you shortly.  **If you have any issues connecting or need assistance, please contact MyChart service desk [phone] ([phone])**  **If using a computer, in order to ensure the best quality for your visit, you will need to use either of the following Internet Browsers: Google Chrome or Microsoft Edge**  . IF USING DOXIMITY or DOXY.ME - The staff will give you instructions on receiving your link to join the meeting the day of your visit.      2-3 DAYS BEFORE YOUR APPOINTMENT  You will receive a telephone call from one of our [facility] team members - your caller ID may say "Unknown caller." If this is a video visit, we will walk you through how to get the video launched on your phone. We will remind you check your blood pressure, heart rate and weight prior to your scheduled appointment. If you have an Apple Watch or Kardia, please upload any pertinent ECG strips the day before or morning of your appointment to MyChart. Our staff will also make sure you have reviewed the consent and agree to move forward with your scheduled tele-health visit.     THE DAY OF YOUR APPOINTMENT  Approximately 15 minutes prior to your scheduled appointment, you will receive a telephone call from one of [facility] team - your caller ID may say "Unknown caller."    Our staff will confirm medications, vital signs for the day and any symptoms you may be experiencing. Please have this information available prior to the time of visit start. It may also be helpful for you to have a pad of paper and pen handy for any instructions given during your visit. They will also walk you through joining the smartphone meeting if this is a video visit.    CONSENT FOR TELE-HEALTH VISIT - PLEASE REVIEW  I hereby voluntarily request, consent and authorize [facility] and its employed or contracted physicians, physician  assistants, nurse practitioners or other licensed health care professionals (the Practitioner), to provide me with telemedicine health care services (the "Services") as deemed necessary by the treating Practitioner. I acknowledge and consent to receive the Services by the Practitioner via telemedicine. I understand that the telemedicine visit will involve communicating with the Practitioner through live audiovisual communication technology and the disclosure of certain medical information by electronic transmission. I acknowledge that I have been given the opportunity to request an in-person assessment or other available alternative prior to the telemedicine visit and am voluntarily participating in the telemedicine visit.  I understand that I have the right to withhold or withdraw my consent to the use of telemedicine in the course of my care at any time, without affecting my right to future care or treatment, and that the Practitioner or I may terminate the telemedicine visit at any time. I understand that I have the right to inspect all information obtained and/or recorded in the course of the telemedicine visit and may receive copies of available information for a reasonable fee.  I understand that some of the potential risks of receiving the Services via telemedicine include:  . Delay or interruption in medical evaluation due to technological equipment failure or disruption; . Information transmitted may not be sufficient (e.g. poor resolution of images) to allow for appropriate medical decision making by the Practitioner; and/or  . In rare instances, security protocols could fail, causing a breach of personal health information.  Furthermore, I acknowledge that it is my responsibility to provide information about my medical history, conditions and care that is complete and accurate to the best of my ability. I acknowledge that Practitioner's advice, recommendations, and/or decision may be based on  factors not within their control, such as incomplete or inaccurate data provided by me or distortions of diagnostic images or specimens that may result from electronic transmissions. I understand that the practice of medicine is not an exact science and that Practitioner makes no warranties or guarantees regarding treatment outcomes. I acknowledge that I will receive a copy of this consent concurrently upon execution via email to the email address I last provided but may also request a printed copy by calling the office of [facility].    I understand that my insurance will be billed for this visit.   I have read or had this consent read to me. . I understand the contents of this consent, which adequately explains the benefits and risks of the Services being provided via telemedicine.  . I have been provided ample opportunity to ask questions regarding this consent and the Services and have had my questions answered to my satisfaction. . I give my informed consent for the services to be provided through the use of telemedicine in my medical care  By participating in this telemedicine visit I agree to the above.  

## 2018-11-12 NOTE — Progress Notes (Deleted)
Virtual Visit via Video Note   This visit type was conducted due to national recommendations for restrictions regarding the COVID-19 Pandemic (e.g. social distancing) in an effort to limit this patient's exposure and mitigate transmission in our community.  Due to his co-morbid illnesses, this patient is at least at moderate risk for complications without adequate follow up.  This format is felt to be most appropriate for this patient at this time.  All issues noted in this document were discussed and addressed.  A limited physical exam was performed with this format.  Please refer to the patient's chart for his consent [facility].   Date:  11/12/2018   ID:  John Marsh, DOB Aug 28, 1945, MRN [id number]  Patient Location: Home Provider Location: Office  PCP:  Eartha InchBadger, Michael C, MD  Cardiologist:  Lesleigh NoeHenry W  III, MD  Electrophysiologist:  None   Evaluation Performed:  Follow-Up Visit  Chief Complaint:  Chest pain  History of Present Illness:    John Marsh is a 73 y.o. male with with a hx of COPD on oxygen (2 L rest, 3 w/exertion), AFon xarelto, CAD(remote stenting 2007 - further details unknown),tobaco abuse, pulmonary nodules by CT, DM,GERD,PAD(details unknown),&HTN.Recently scheduled for nuclear study but not done.  ***  The patient {does/does not:200015} have symptoms concerning for COVID-19 infection (fever, chills, cough, or new shortness of breath).    Past Medical History:  Diagnosis Date  . Acute on chronic respiratory failure with hypoxia (HCC) 09/19/2016  . Anemia   . Atrial fibrillation (HCC)    Chadsvasc2=3  . Chest pain 06/30/2016  . Chronic diastolic HF (heart failure) (HCC)   . Chronic respiratory failure with hypoxia (HCC) 07/03/2015  . COPD (chronic obstructive pulmonary disease) (HCC)   . COPD exacerbation (HCC) 05/07/2017  . COPD with acute exacerbation (HCC) 07/03/2015  . Coronary artery disease due to lipid rich plaque   .  Coronary heart disease   . Dyspnea 07/15/2017  . Elevated troponin 09/19/2016  . Emphysema lung (HCC) 03/04/2015  . Emphysema of lung (HCC)   . Hyperlipidemia   . Hypertension   . Iron deficiency anemia   . MI (myocardial infarction) (HCC)   . Multiple lung nodules on CT   . Nocturnal hypoxia   . Peripheral arterial disease (HCC)   . PVD (peripheral vascular disease) with claudication (HCC) 07/27/2015  . Respiratory failure (HCC) 05/02/2017  . Shortness of breath 06/03/2015  . Shortness of breath dyspnea   . Tachycardia 07/03/2015  . Typical atrial flutter [facility](HCC)    Past Surgical History:  Procedure Laterality Date  . CHOLECYSTECTOMY    . COLONOSCOPY WITH PROPOFOL N/A 07/19/2017   Procedure: COLONOSCOPY WITH PROPOFOL;  Surgeon: Rachael FeeJacobs, Daniel P, MD;  Location: [facility];  Service: Endoscopy;  Laterality: N/A;  . ESOPHAGOGASTRODUODENOSCOPY (EGD) WITH PROPOFOL N/A 07/19/2017   Procedure: ESOPHAGOGASTRODUODENOSCOPY (EGD) WITH PROPOFOL;  Surgeon: Rachael FeeJacobs, Daniel P, MD;  Location: [facility];  Service: Endoscopy;  Laterality: N/A;  . NASAL SEPTUM SURGERY    . PERIPHERAL VASCULAR CATHETERIZATION N/A 07/27/2015   Procedure: Abdominal Aortogram;  Surgeon: Chuck Hinthristopher S Dickson, MD;  [facility];  Service: Cardiovascular;  Laterality: N/A;  . PERIPHERAL VASCULAR CATHETERIZATION Bilateral 07/27/2015   Procedure: Lower Extremity Angiography;  Surgeon: Chuck Hinthristopher S Dickson, MD;  [facility];  Service: Cardiovascular;  Laterality: Bilateral;  . stent placed       No outpatient medications have been marked as taking for  the 11/13/18 encounter (Appointment) with Lyn Records, MD.     Allergies:   Patient has no known allergies.   Social History   Tobacco Use  . Smoking status: Current Every Day Smoker    Packs/day: 0.25    Years: 54.00    Pack years: 13.50    Types: Cigarettes    Start date: 08/01/1959  . Smokeless tobacco: Never Used  . Tobacco comment:  smokes 4-5 cigerettes/day  Substance Use Topics  . Alcohol use: No    Alcohol/week: 0.0 standard drinks  . Drug use: No     Family Hx: The patient's family history includes Heart disease in his father, mother, paternal grandfather, and sister.  ROS:   Please see the history of present illness.    *** All other systems reviewed and are negative.   Prior CV studies:   The following studies were reviewed today:  ***  Labs/Other Tests and Data Reviewed:    EKG:  {EKG/Telemetry Strips Reviewed:[id number]}  Recent Labs: 07/18/2018: ALT 16; BUN 11; Creatinine, Ser 0.95; Hemoglobin 13.0; Platelets 185; Potassium 4.5; Sodium 140   Recent Lipid Panel Lab Results  Component Value Date/Time   CHOL 119 07/15/2017 05:05 AM   TRIG 39 07/15/2017 05:05 AM   HDL 52 07/15/2017 05:05 AM   CHOLHDL 2.3 07/15/2017 05:05 AM   LDLCALC 59 07/15/2017 05:05 AM    Wt Readings from Last 3 Encounters:  10/29/18 146 lb (66.2 kg)  07/24/18 144 lb (65.3 kg)  03/21/18 143 lb (64.9 kg)     Objective:    Vital Signs:  There were no vitals taken for this visit.   {[facility] Virtual Exam (Optional):[phone]::"VITAL SIGNS:  reviewed"}  ASSESSMENT & PLAN:    1. Chest pain, unspecified type   2. PAF (paroxysmal atrial fibrillation) (HCC)   3. Essential hypertension   4. Chronic anticoagulation   5. Coronary artery disease involving native coronary artery of native heart with unstable angina pectoris (HCC)   6. Diabetes mellitus without complication (HCC)   7. Chronic diastolic HF (heart failure) (HCC)   8. Other emphysema (HCC)   9. Educated About Covid-19 Virus Infection    PLAN:  1. ***  COVID-19 Education: The signs and symptoms of COVID-19 were discussed with the patient and how to seek care for testing (follow up with PCP or arrange E-visit).  ***The importance of social distancing was discussed today.  Time:   Today, I have spent *** minutes with the patient with telehealth  technology discussing the above problems.     Medication Adjustments/Labs and Tests Ordered: Current medicines are reviewed at length with the patient today.  Concerns regarding medicines are outlined above.   Tests Ordered: No orders of the defined types were placed in this encounter.   Medication Changes: No orders of the defined types were placed in this encounter.   Disposition:  Follow up {follow up:15908}  Signed, Lesleigh Noe, MD  11/12/2018 5:01 PM    [facility]

## 2018-11-13 ENCOUNTER — Other Ambulatory Visit: Payer: Self-pay

## 2018-11-13 ENCOUNTER — Other Ambulatory Visit (HOSPITAL_COMMUNITY): Payer: Medicare Other

## 2018-11-13 ENCOUNTER — Telehealth (INDEPENDENT_AMBULATORY_CARE_PROVIDER_SITE_OTHER): Payer: Medicare Other | Admitting: Interventional Cardiology

## 2018-11-13 ENCOUNTER — Encounter: Payer: Self-pay | Admitting: Interventional Cardiology

## 2018-11-13 VITALS — Ht 70.0 in

## 2018-11-13 DIAGNOSIS — R079 Chest pain, unspecified: Secondary | ICD-10-CM

## 2018-11-15 NOTE — Progress Notes (Signed)
NO SHOW

## 2018-11-19 ENCOUNTER — Telehealth (HOSPITAL_COMMUNITY): Payer: Self-pay

## 2018-11-19 NOTE — Telephone Encounter (Signed)
Instructions left on the patient's answering machine. Asked to call back with any questions. S.Meilin Brosh EMTP 

## 2018-11-20 ENCOUNTER — Ambulatory Visit (HOSPITAL_COMMUNITY): Payer: Medicare Other | Admitting: Nurse Practitioner

## 2018-11-21 ENCOUNTER — Encounter (HOSPITAL_COMMUNITY): Payer: Medicare Other

## 2018-11-22 ENCOUNTER — Other Ambulatory Visit (HOSPITAL_COMMUNITY): Payer: Medicare Other

## 2018-11-27 ENCOUNTER — Other Ambulatory Visit: Payer: Self-pay | Admitting: Pulmonary Disease

## 2018-11-27 ENCOUNTER — Encounter: Payer: Medicare Other | Admitting: Acute Care

## 2018-11-27 ENCOUNTER — Inpatient Hospital Stay: Admission: RE | Admit: 2018-11-27 | Payer: Medicare Other | Source: Ambulatory Visit

## 2018-11-29 ENCOUNTER — Ambulatory Visit (HOSPITAL_COMMUNITY): Payer: Medicare Other | Admitting: Hematology

## 2018-12-05 ENCOUNTER — Encounter (HOSPITAL_COMMUNITY): Payer: Self-pay | Admitting: Physician Assistant

## 2018-12-20 ENCOUNTER — Other Ambulatory Visit: Payer: Self-pay | Admitting: Interventional Cardiology

## 2018-12-23 NOTE — Telephone Encounter (Signed)
Prescription refill request for Eliquis received.  Last office visit: Dr. Tamala Julian (11-13-2018) Scr: 0.95 (07-18-2018) Age: 73 y.o. Weight: 65.3 kg (07-24-2018)  Prescription refill sent.

## 2019-01-08 ENCOUNTER — Inpatient Hospital Stay: Admission: RE | Admit: 2019-01-08 | Payer: Medicare Other | Source: Ambulatory Visit

## 2019-01-08 ENCOUNTER — Encounter: Payer: Medicare Other | Admitting: Acute Care

## 2019-03-18 IMAGING — CT CT ANGIO CHEST
2 of 6 series · 18 of 36 positions shown · IV contrast (Omni 300)
Comparison: 02/01/2017

CLINICAL DATA: Evaluate for acute pulmonary embolus. Shortness of
breath.

EXAM:
CT ANGIOGRAPHY CHEST WITH CONTRAST
TECHNIQUE: Multidetector CT imaging of the chest was performed using the
standard protocol during bolus administration of intravenous
contrast. Multiplanar CT image reconstructions and MIPs were
obtained to evaluate the vascular anatomy.
CONTRAST:  100mL AEUZA8-UWO IOPAMIDOL (AEUZA8-UWO) INJECTION 76%

[Series 7: pe thins · axial · 0.81mm/px · z∈[+1122,+1410]mm · 17 of 327 slices shown]
[im 19/327  lung]
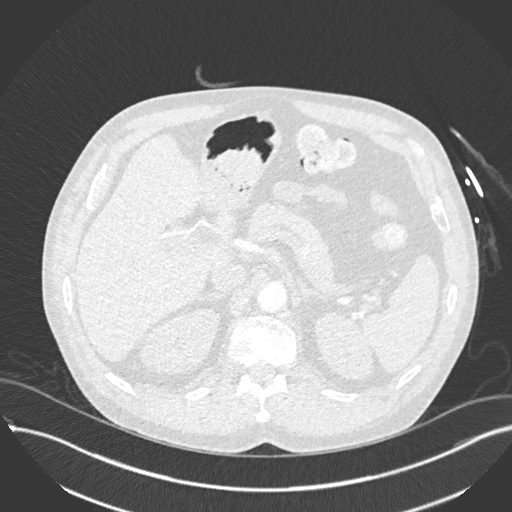
[im 37/327  mediastinal]
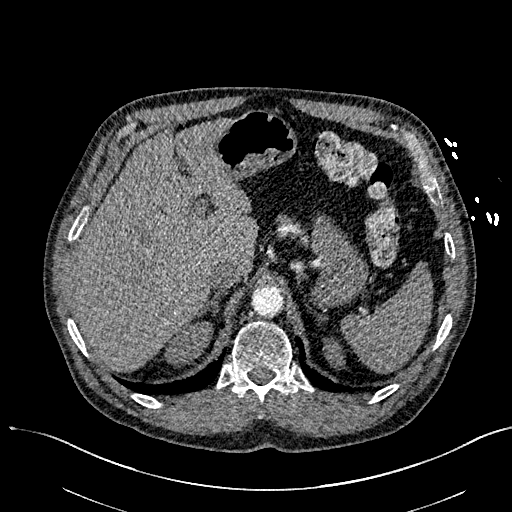
[im 55/327  lung]
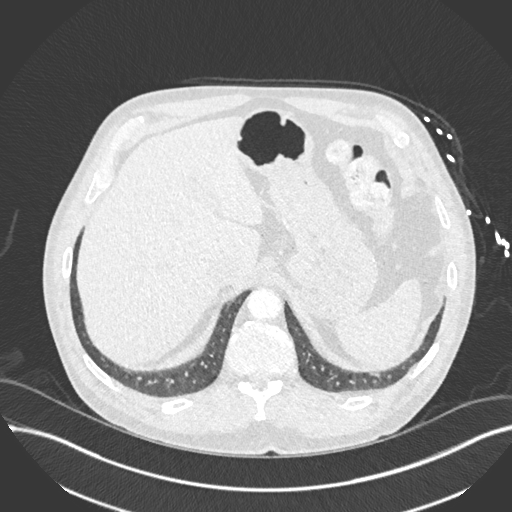
[im 73/327  mediastinal]
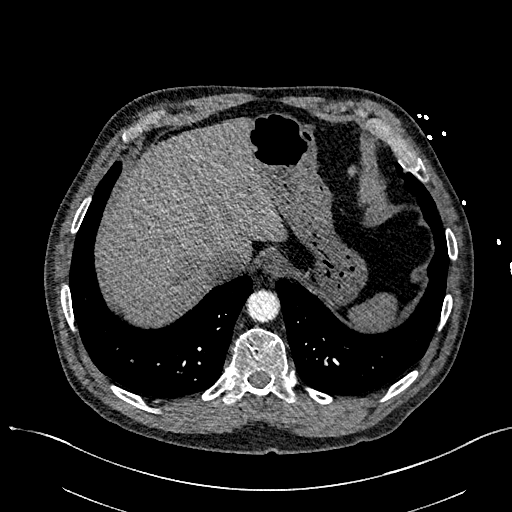
[im 91/327  lung]
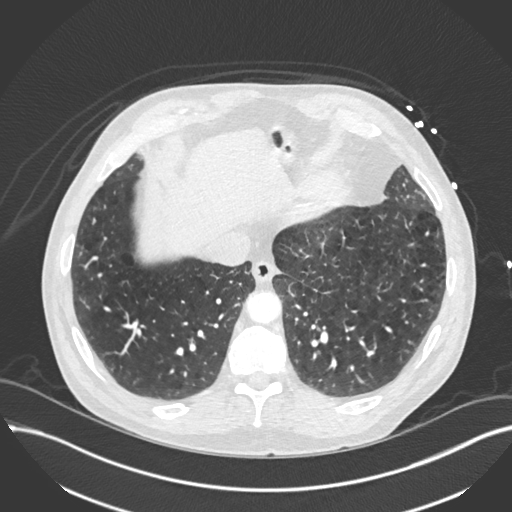
[im 109/327  mediastinal]
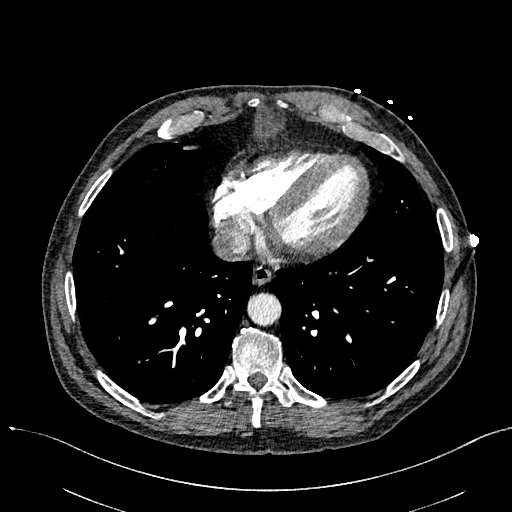
[im 127/327  lung]
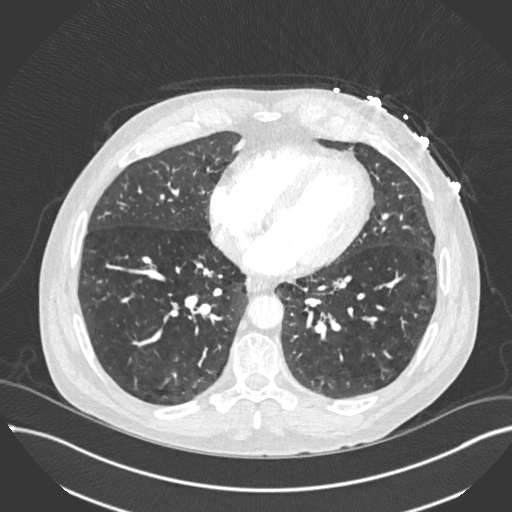
[im 145/327  mediastinal]
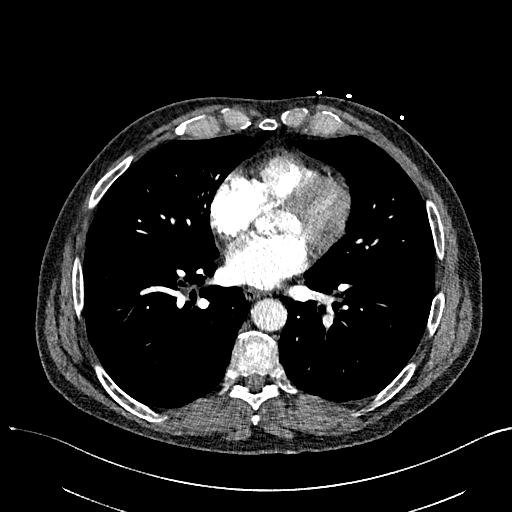
[im 164/327  lung]
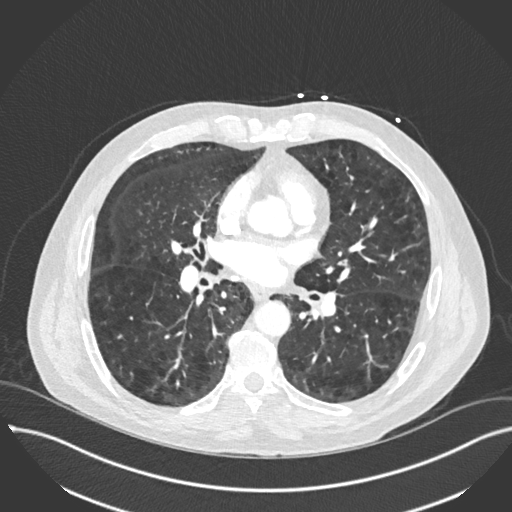
[im 182/327  mediastinal]
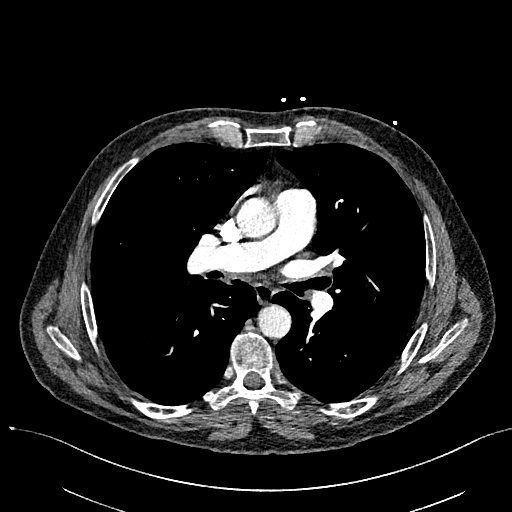
[im 200/327  lung]
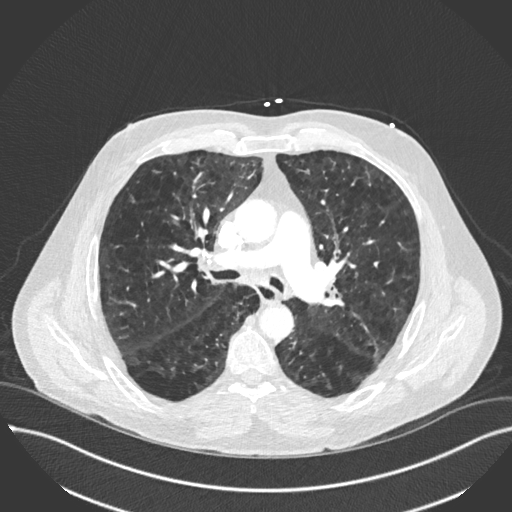
[im 218/327  mediastinal]
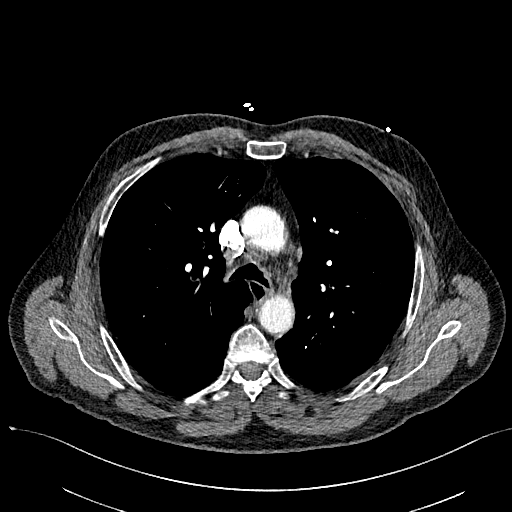
[im 236/327  lung]
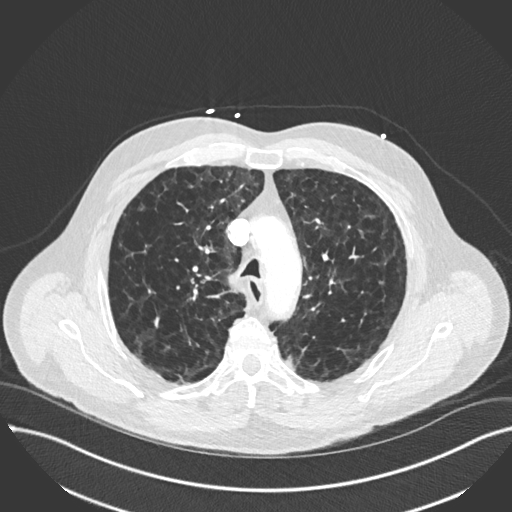
[im 254/327  mediastinal]
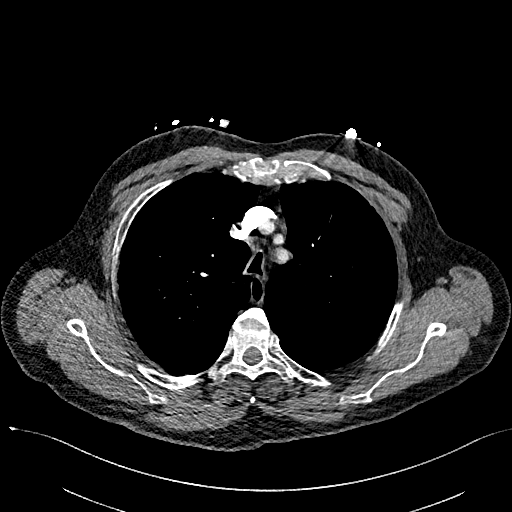
[im 272/327  lung]
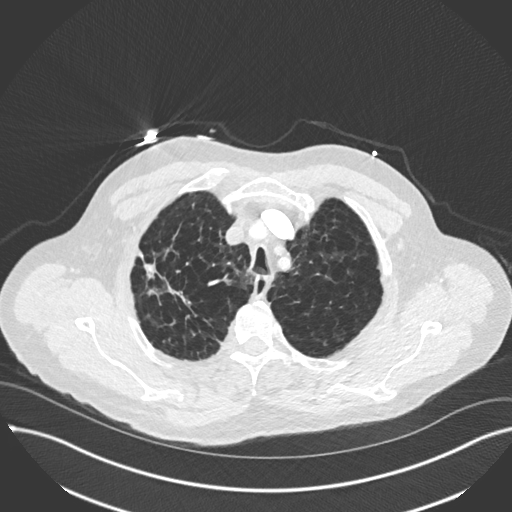
[im 290/327  mediastinal]
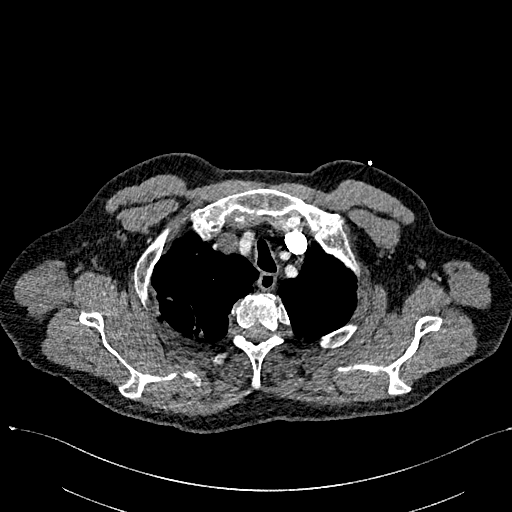
[im 308/327  lung]
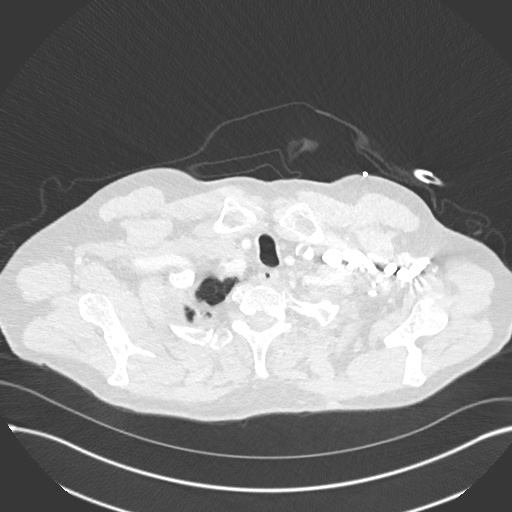

[Series 8: pe 2mm cor · coronal · 0.62mm/px · 1 of 157 slices shown]
[im 79/157  mediastinal]
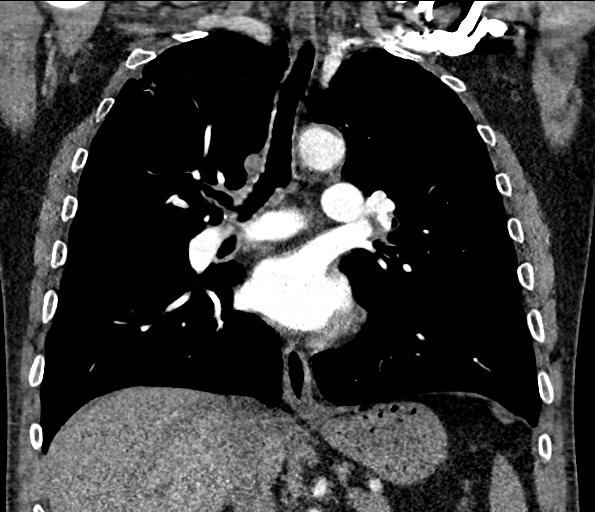

[18 of 36 positions shown; findings below may reference images not displayed]

FINDINGS: Cardiovascular: Satisfactory opacification of the pulmonary arteries
to the segmental level. No evidence of pulmonary embolism. Normal
heart size. No pericardial effusion. Aortic atherosclerosis. LAD
coronary artery calcifications noted.

Mediastinum/Nodes: The trachea is patent and midline. Normal
appearance of the esophagus. No mediastinal or hilar adenopathy.

Lungs/Pleura: No pleural effusion. Calcified granuloma identified in
the left upper lobe. Moderate to advanced changes of centrilobular
and paraseptal emphysema. No airspace consolidation. Scarring and
architectural distortion within the right upper lobe is again noted
compatible with resolving inflammation/infection. Pulmonary nodule
within the superior segment of the left lower lobe measures 4 mm,
image 72 of series 6. Decreased from 7 mm previously. Right lower
lobe pulmonary nodule is identified measuring 7 mm, image 85 of
series 6. New from comparison exam.

Upper Abdomen: No acute abnormality.

Musculoskeletal: There is degenerative disc disease noted within the
thoracic spine. No aggressive lytic or sclerotic bone lesions.

Review of the MIP images confirms the above findings.
IMPRESSION: 1. No evidence for acute pulmonary embolus.
2. Aortic Atherosclerosis (EHI96-SHM.M) and Emphysema (EHI96-6P3.C).
Lad coronary artery calcifications noted.
3. Right lower lobe pulmonary nodule is new from comparison exam
measuring 7 mm. Non-contrast chest CT at 6-12 months is recommended.
If the nodule is stable at time of repeat CT, then future CT at
18-24 months (from today's scan) is considered optional for low-risk
patients, but is recommended for high-risk patients. This
recommendation follows the consensus statement: Guidelines for
Management of Incidental Pulmonary Nodules Detected on CT Images:

## 2019-03-23 IMAGING — DX DG CHEST 2V
2 series · 2 of 2 positions shown · non-contrast
Comparison: 05/02/2017

CLINICAL DATA: Shortness of breath after discharge from hospital
this morning.

EXAM:
CHEST  2 VIEW

[w chest pa]
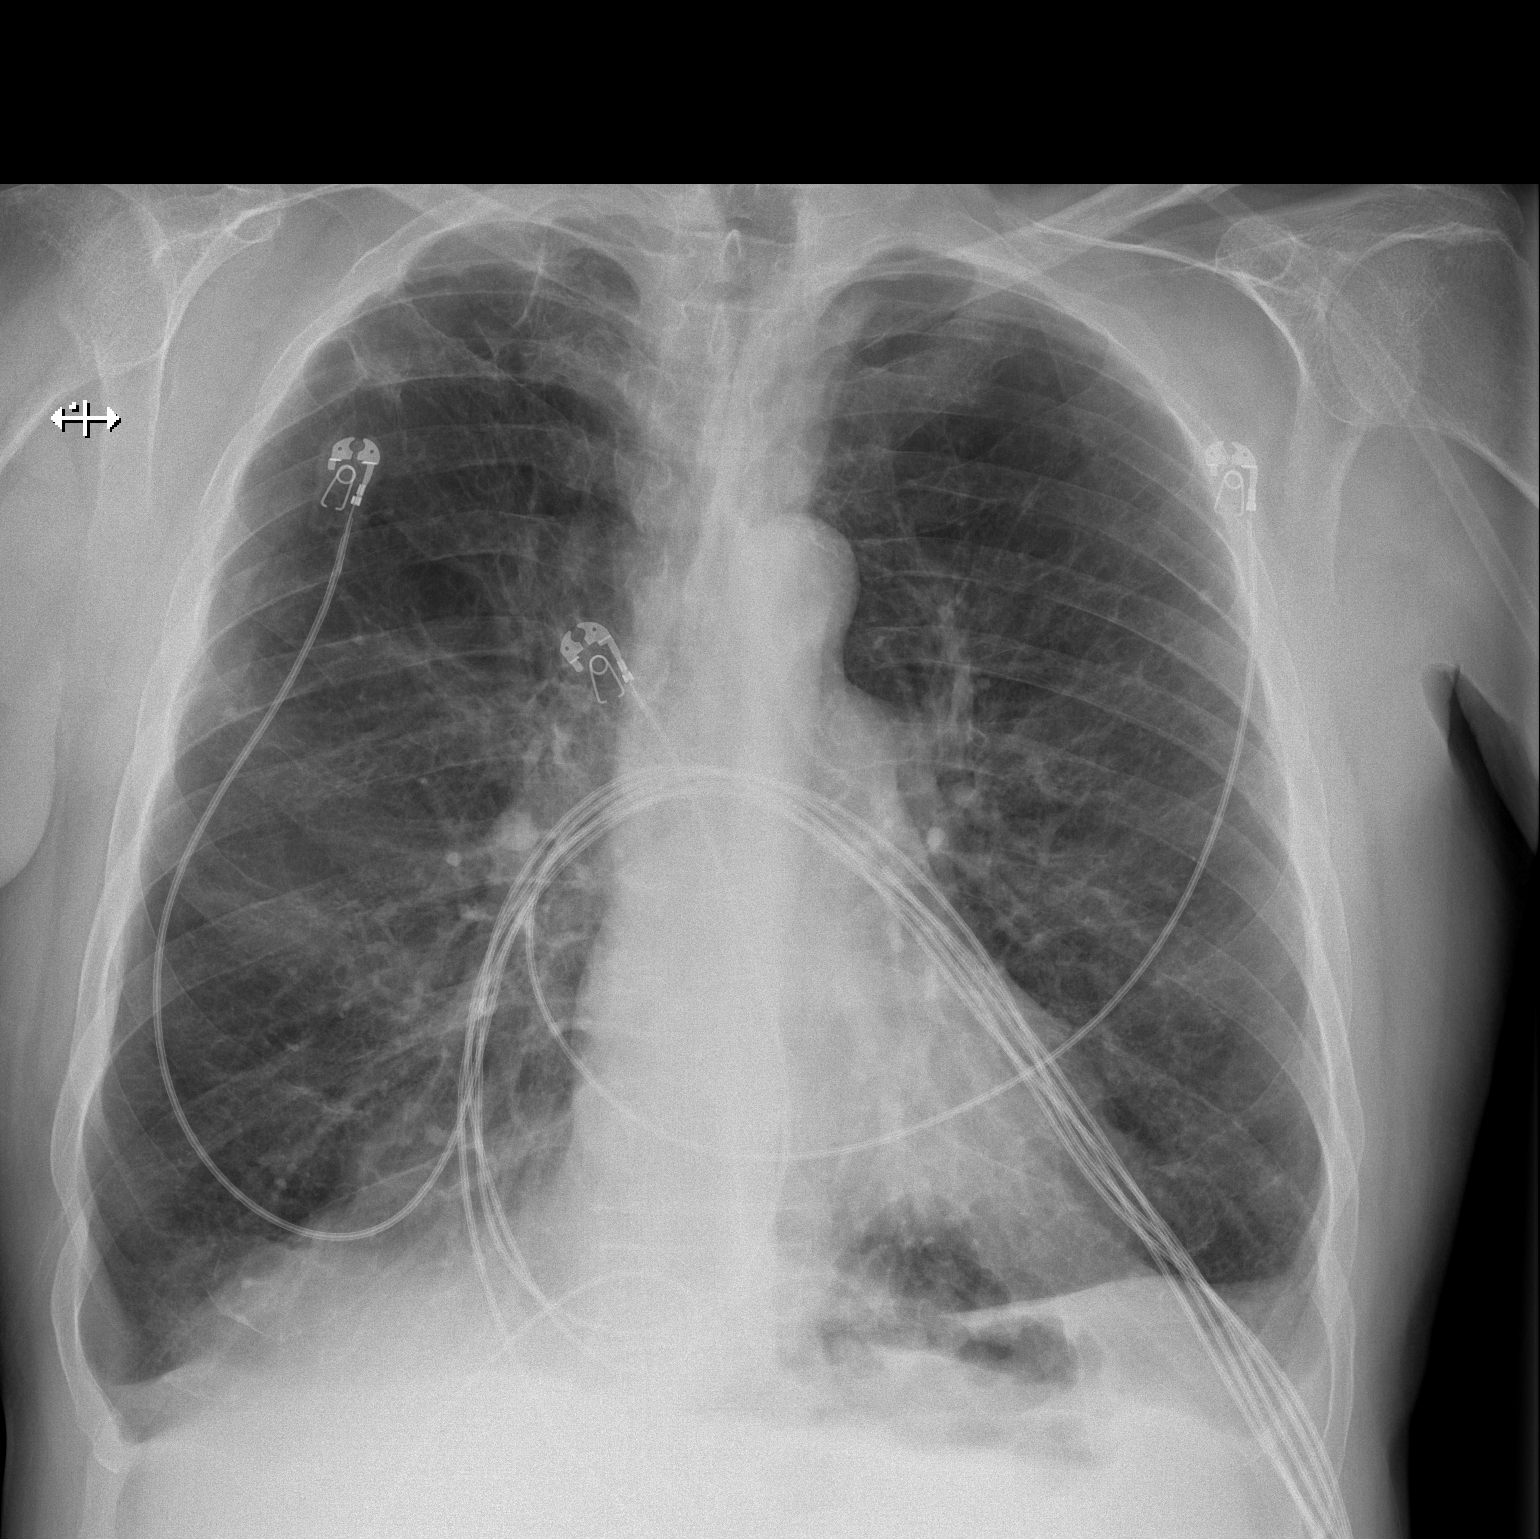

[w chest lat]
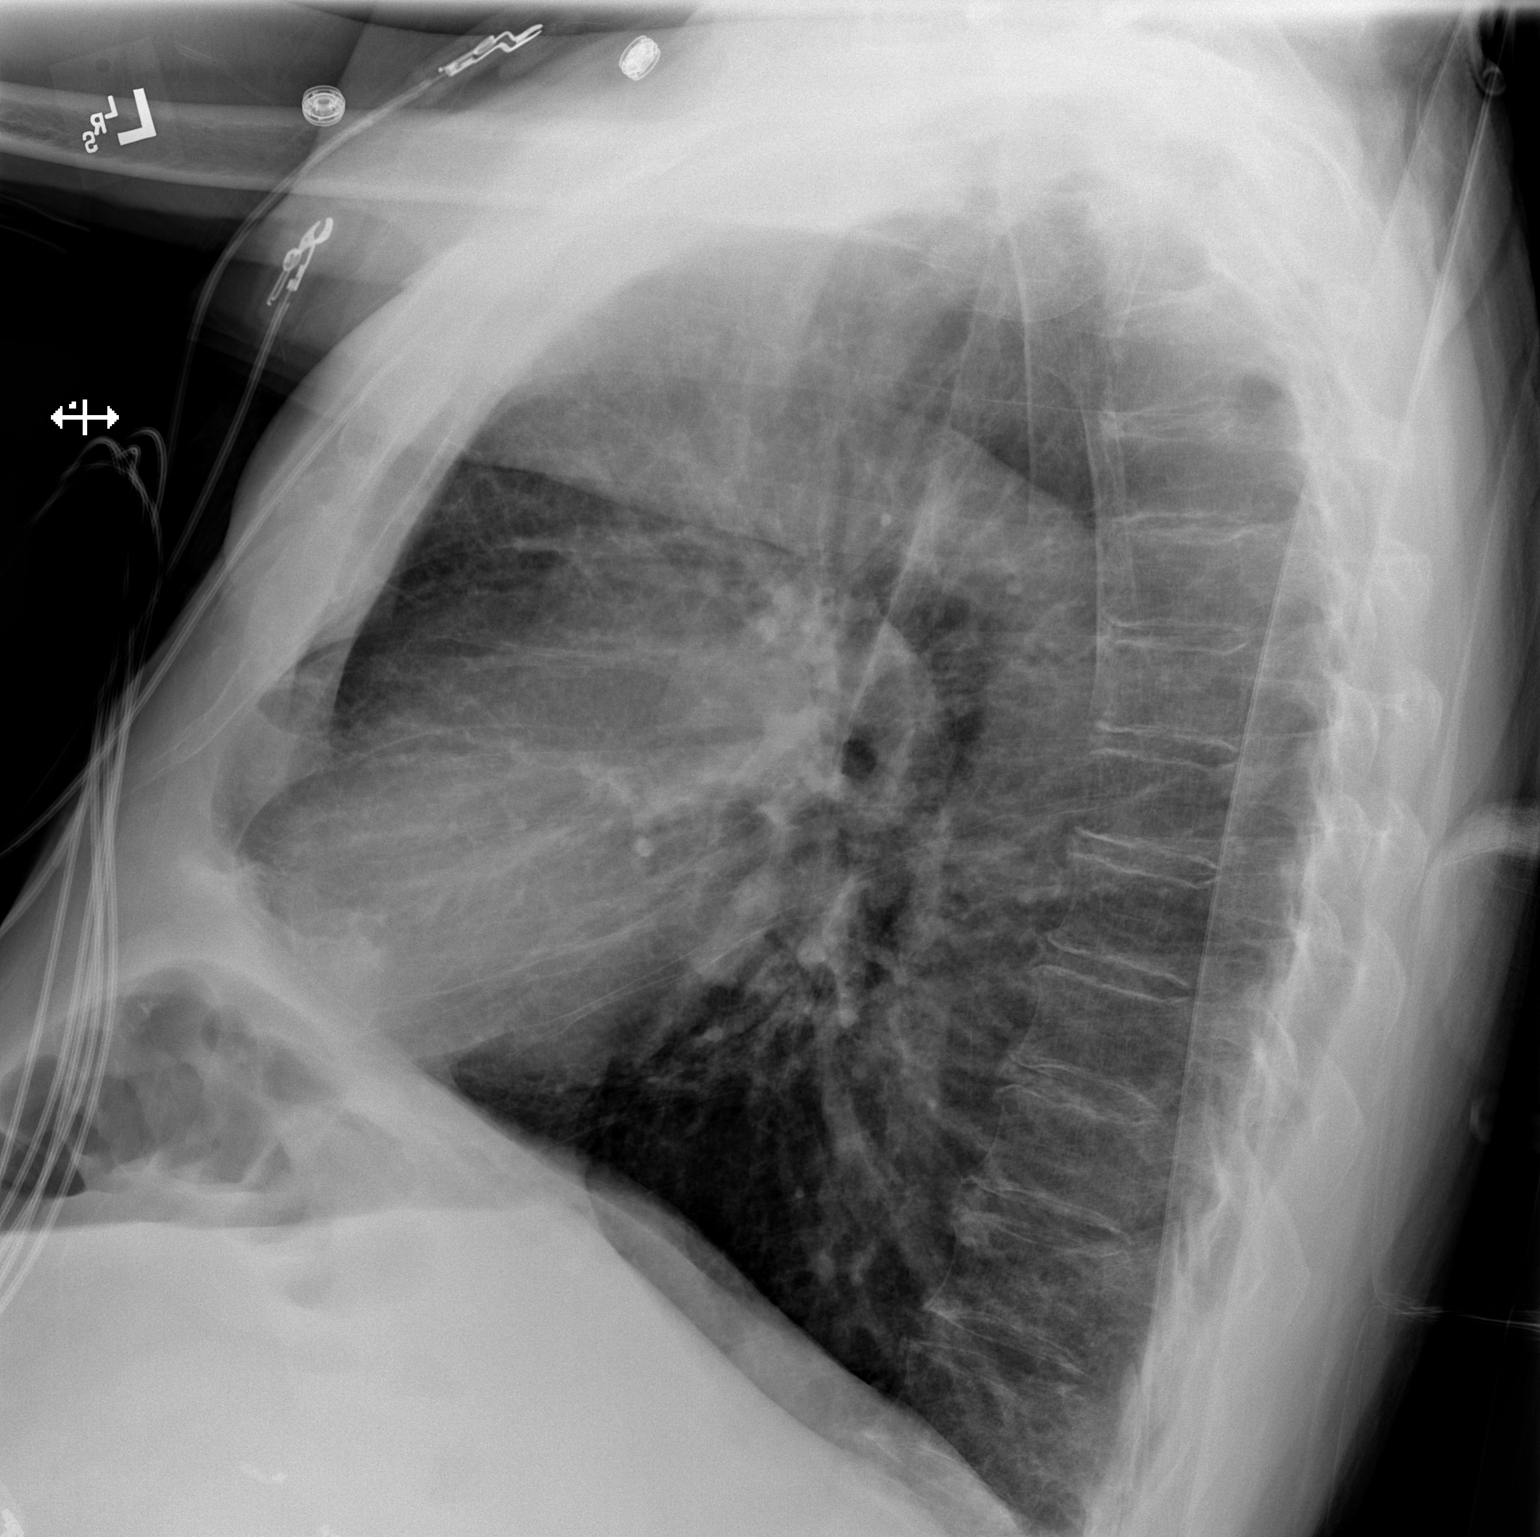

[2 of 2 positions shown; findings below may reference images not displayed]

FINDINGS: Heart size and pulmonary vascularity are normal. Emphysematous
changes in the lungs with scattered fibrosis. Scattered calcified
granulomas bilaterally. No developing consolidation or airspace
disease. No blunting of costophrenic angles. No pneumothorax.
Mediastinal contours appear intact. Calcification of the aorta.
IMPRESSION: Emphysematous changes and scattered fibrosis in the lungs. No
evidence of active pulmonary disease. Aortic atherosclerosis.

## 2019-07-08 ENCOUNTER — Other Ambulatory Visit: Payer: Self-pay | Admitting: Interventional Cardiology

## 2019-07-08 NOTE — Telephone Encounter (Signed)
Age 74, weight 66.2kg, SCr 0.95 last checked 07/18/18, last OV 10/2018, afib indication.  Pt needs lab work, will be due for f/u appt in May. On correct dose based on age and weight. Will send in refill and reassess at next refill time.

## 2019-08-20 IMAGING — CR DG CHEST 2V
2 series · 2 of 2 positions shown · non-contrast
Comparison: Chest CT 07/24/2017

CLINICAL DATA: Low back pain.  Chest pain.  COPD

EXAM:
CHEST - 2 VIEW

[chest pa]
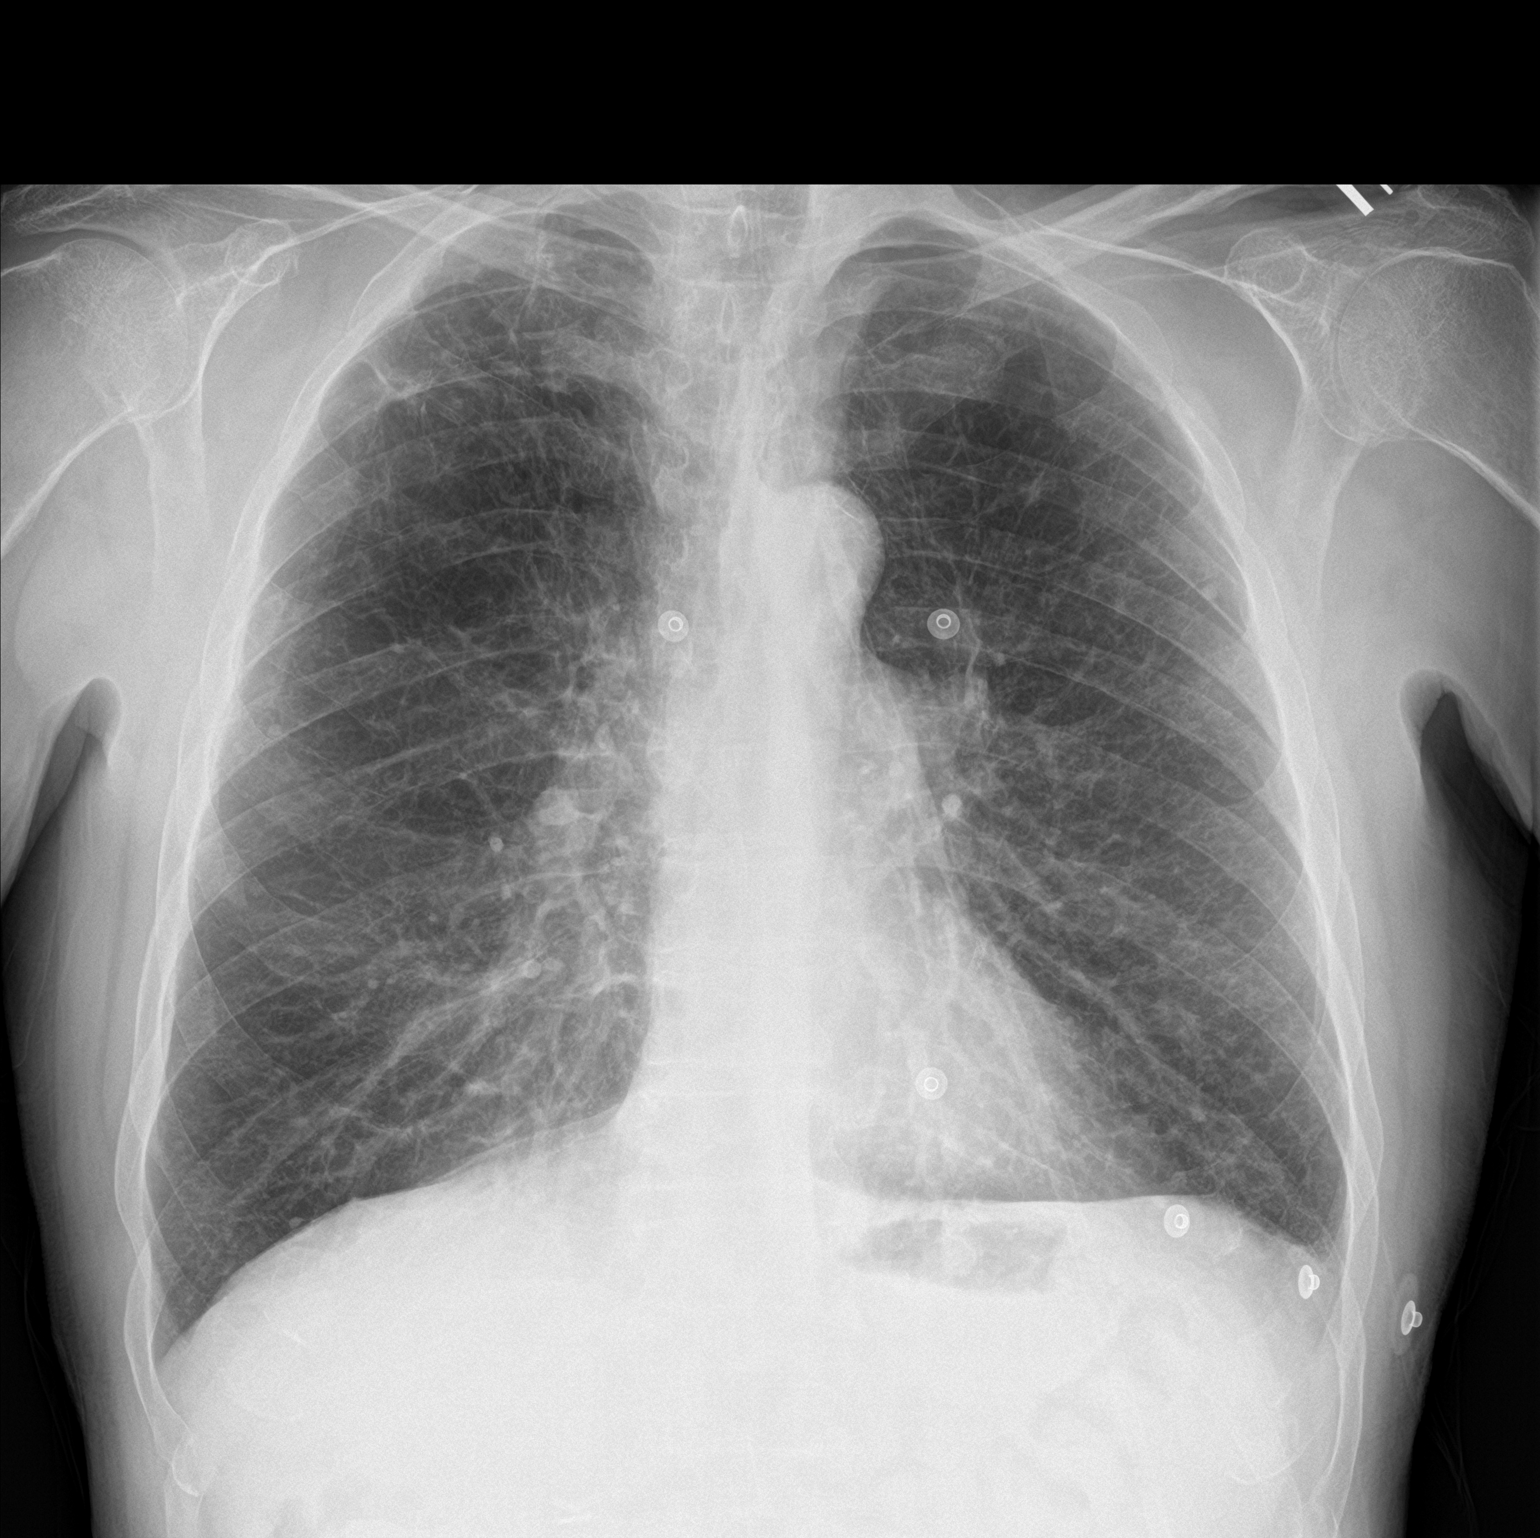

[chest lat]
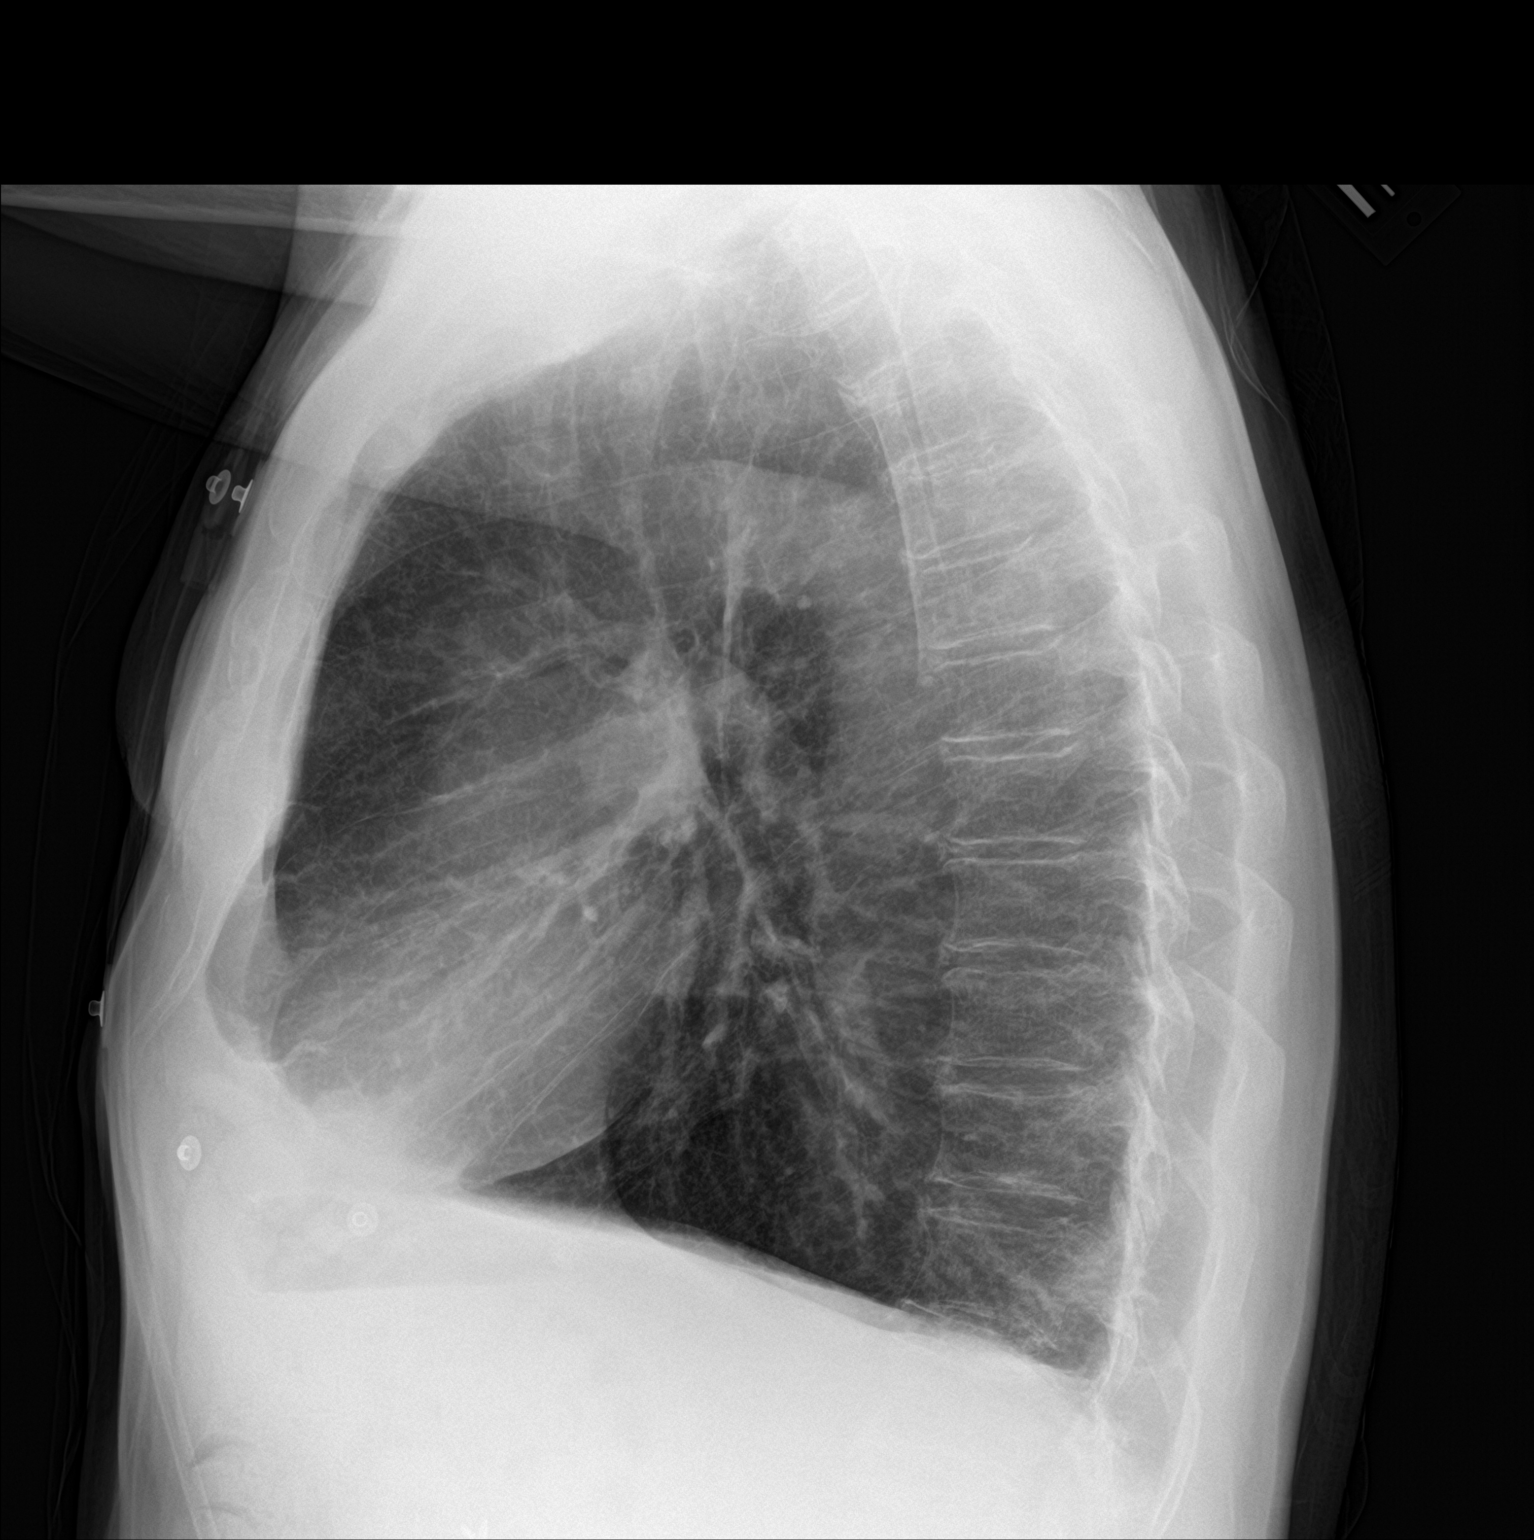

[2 of 2 positions shown; findings below may reference images not displayed]

FINDINGS: Normal cardiac silhouette. Lungs are hyperinflated. There is bullous
change in the RIGHT upper lobe. No focal infiltrate. No pulmonary
nodularity. No pneumothorax. No acute osseous abnormality.
IMPRESSION: Emphysematous change.  No acute findings.

## 2019-10-18 ENCOUNTER — Other Ambulatory Visit: Payer: Self-pay | Admitting: Physician Assistant

## 2019-11-21 ENCOUNTER — Other Ambulatory Visit: Payer: Self-pay | Admitting: Interventional Cardiology

## 2019-12-09 ENCOUNTER — Other Ambulatory Visit: Payer: Self-pay | Admitting: Interventional Cardiology

## 2020-02-20 ENCOUNTER — Other Ambulatory Visit: Payer: Self-pay | Admitting: Interventional Cardiology

## 2020-03-11 ENCOUNTER — Other Ambulatory Visit: Payer: Self-pay | Admitting: Interventional Cardiology

## 2020-03-31 ENCOUNTER — Other Ambulatory Visit: Payer: Self-pay | Admitting: Interventional Cardiology

## 2020-04-15 ENCOUNTER — Other Ambulatory Visit: Payer: Self-pay | Admitting: Interventional Cardiology

## 2020-05-17 ENCOUNTER — Emergency Department (HOSPITAL_COMMUNITY): Payer: Medicare Other

## 2020-05-17 ENCOUNTER — Inpatient Hospital Stay (HOSPITAL_COMMUNITY)
Admission: EM | Admit: 2020-05-17 | Discharge: 2020-06-19 | DRG: 871 | Disposition: E | Payer: Medicare Other | Attending: Pulmonary Disease | Admitting: Pulmonary Disease

## 2020-05-17 DIAGNOSIS — I2583 Coronary atherosclerosis due to lipid rich plaque: Secondary | ICD-10-CM | POA: Diagnosis present

## 2020-05-17 DIAGNOSIS — K551 Chronic vascular disorders of intestine: Secondary | ICD-10-CM | POA: Diagnosis present

## 2020-05-17 DIAGNOSIS — J9621 Acute and chronic respiratory failure with hypoxia: Secondary | ICD-10-CM | POA: Diagnosis present

## 2020-05-17 DIAGNOSIS — R64 Cachexia: Secondary | ICD-10-CM | POA: Diagnosis present

## 2020-05-17 DIAGNOSIS — E785 Hyperlipidemia, unspecified: Secondary | ICD-10-CM | POA: Diagnosis present

## 2020-05-17 DIAGNOSIS — W19XXXA Unspecified fall, initial encounter: Secondary | ICD-10-CM | POA: Diagnosis present

## 2020-05-17 DIAGNOSIS — K529 Noninfective gastroenteritis and colitis, unspecified: Secondary | ICD-10-CM | POA: Diagnosis present

## 2020-05-17 DIAGNOSIS — E119 Type 2 diabetes mellitus without complications: Secondary | ICD-10-CM

## 2020-05-17 DIAGNOSIS — I110 Hypertensive heart disease with heart failure: Secondary | ICD-10-CM | POA: Diagnosis present

## 2020-05-17 DIAGNOSIS — R748 Abnormal levels of other serum enzymes: Secondary | ICD-10-CM | POA: Diagnosis present

## 2020-05-17 DIAGNOSIS — S80212A Abrasion, left knee, initial encounter: Secondary | ICD-10-CM | POA: Diagnosis present

## 2020-05-17 DIAGNOSIS — D509 Iron deficiency anemia, unspecified: Secondary | ICD-10-CM | POA: Diagnosis present

## 2020-05-17 DIAGNOSIS — K859 Acute pancreatitis without necrosis or infection, unspecified: Secondary | ICD-10-CM | POA: Diagnosis present

## 2020-05-17 DIAGNOSIS — N179 Acute kidney failure, unspecified: Secondary | ICD-10-CM | POA: Diagnosis present

## 2020-05-17 DIAGNOSIS — J9690 Respiratory failure, unspecified, unspecified whether with hypoxia or hypercapnia: Secondary | ICD-10-CM | POA: Diagnosis present

## 2020-05-17 DIAGNOSIS — Z7901 Long term (current) use of anticoagulants: Secondary | ICD-10-CM | POA: Diagnosis not present

## 2020-05-17 DIAGNOSIS — J438 Other emphysema: Secondary | ICD-10-CM | POA: Diagnosis not present

## 2020-05-17 DIAGNOSIS — S80211A Abrasion, right knee, initial encounter: Secondary | ICD-10-CM | POA: Diagnosis present

## 2020-05-17 DIAGNOSIS — J181 Lobar pneumonia, unspecified organism: Secondary | ICD-10-CM | POA: Diagnosis present

## 2020-05-17 DIAGNOSIS — E43 Unspecified severe protein-calorie malnutrition: Secondary | ICD-10-CM | POA: Diagnosis present

## 2020-05-17 DIAGNOSIS — I255 Ischemic cardiomyopathy: Secondary | ICD-10-CM | POA: Diagnosis present

## 2020-05-17 DIAGNOSIS — I482 Chronic atrial fibrillation, unspecified: Secondary | ICD-10-CM | POA: Diagnosis present

## 2020-05-17 DIAGNOSIS — Z4659 Encounter for fitting and adjustment of other gastrointestinal appliance and device: Secondary | ICD-10-CM

## 2020-05-17 DIAGNOSIS — R6521 Severe sepsis with septic shock: Secondary | ICD-10-CM | POA: Diagnosis present

## 2020-05-17 DIAGNOSIS — I5032 Chronic diastolic (congestive) heart failure: Secondary | ICD-10-CM | POA: Diagnosis present

## 2020-05-17 DIAGNOSIS — F17210 Nicotine dependence, cigarettes, uncomplicated: Secondary | ICD-10-CM | POA: Diagnosis present

## 2020-05-17 DIAGNOSIS — Z66 Do not resuscitate: Secondary | ICD-10-CM | POA: Diagnosis not present

## 2020-05-17 DIAGNOSIS — Z20822 Contact with and (suspected) exposure to covid-19: Secondary | ICD-10-CM | POA: Diagnosis present

## 2020-05-17 DIAGNOSIS — J439 Emphysema, unspecified: Secondary | ICD-10-CM | POA: Diagnosis present

## 2020-05-17 DIAGNOSIS — J9622 Acute and chronic respiratory failure with hypercapnia: Secondary | ICD-10-CM

## 2020-05-17 DIAGNOSIS — I252 Old myocardial infarction: Secondary | ICD-10-CM

## 2020-05-17 DIAGNOSIS — R0602 Shortness of breath: Secondary | ICD-10-CM | POA: Diagnosis not present

## 2020-05-17 DIAGNOSIS — F17200 Nicotine dependence, unspecified, uncomplicated: Secondary | ICD-10-CM | POA: Diagnosis present

## 2020-05-17 DIAGNOSIS — Y92009 Unspecified place in unspecified non-institutional (private) residence as the place of occurrence of the external cause: Secondary | ICD-10-CM

## 2020-05-17 DIAGNOSIS — A419 Sepsis, unspecified organism: Secondary | ICD-10-CM | POA: Diagnosis present

## 2020-05-17 DIAGNOSIS — I2510 Atherosclerotic heart disease of native coronary artery without angina pectoris: Secondary | ICD-10-CM | POA: Diagnosis present

## 2020-05-17 DIAGNOSIS — E1151 Type 2 diabetes mellitus with diabetic peripheral angiopathy without gangrene: Secondary | ICD-10-CM | POA: Diagnosis present

## 2020-05-17 DIAGNOSIS — Z9981 Dependence on supplemental oxygen: Secondary | ICD-10-CM

## 2020-05-17 DIAGNOSIS — Z8249 Family history of ischemic heart disease and other diseases of the circulatory system: Secondary | ICD-10-CM

## 2020-05-17 DIAGNOSIS — Z681 Body mass index (BMI) 19 or less, adult: Secondary | ICD-10-CM

## 2020-05-17 DIAGNOSIS — R627 Adult failure to thrive: Secondary | ICD-10-CM | POA: Diagnosis present

## 2020-05-17 DIAGNOSIS — L8990 Pressure ulcer of unspecified site, unspecified stage: Secondary | ICD-10-CM | POA: Insufficient documentation

## 2020-05-17 DIAGNOSIS — Z79899 Other long term (current) drug therapy: Secondary | ICD-10-CM

## 2020-05-17 DIAGNOSIS — I483 Typical atrial flutter: Secondary | ICD-10-CM | POA: Diagnosis present

## 2020-05-17 DIAGNOSIS — Z7951 Long term (current) use of inhaled steroids: Secondary | ICD-10-CM

## 2020-05-17 DIAGNOSIS — I10 Essential (primary) hypertension: Secondary | ICD-10-CM | POA: Diagnosis present

## 2020-05-17 LAB — I-STAT ARTERIAL BLOOD GAS, ED
Acid-base deficit: 2 mmol/L (ref 0.0–2.0)
Acid-base deficit: 2 mmol/L (ref 0.0–2.0)
Bicarbonate: 28.4 mmol/L — ABNORMAL HIGH (ref 20.0–28.0)
Bicarbonate: 28 mmol/L (ref 20.0–28.0)
Calcium, Ion: 1.2 mmol/L (ref 1.15–1.40)
Calcium, Ion: 1.16 mmol/L (ref 1.15–1.40)
HCT: 43 % (ref 39.0–52.0)
HCT: 45 % (ref 39.0–52.0)
Hemoglobin: 14.6 g/dL (ref 13.0–17.0)
Hemoglobin: 15.3 g/dL (ref 13.0–17.0)
O2 Saturation: 92 %
O2 Saturation: 43 %
Potassium: 3.9 mmol/L (ref 3.5–5.1)
Potassium: 2.9 mmol/L — ABNORMAL LOW (ref 3.5–5.1)
Sodium: 139 mmol/L (ref 135–145)
Sodium: 140 mmol/L (ref 135–145)
TCO2: 31 mmol/L (ref 22–32)
TCO2: 30 mmol/L (ref 22–32)
pCO2 arterial: 71.6 mmHg (ref 32.0–48.0)
pCO2 arterial: 69.9 mmHg (ref 32.0–48.0)
pH, Arterial: 7.206 — ABNORMAL LOW (ref 7.350–7.450)
pH, Arterial: 7.211 — ABNORMAL LOW (ref 7.350–7.450)
pO2, Arterial: 79 mmHg — ABNORMAL LOW (ref 83.0–108.0)
pO2, Arterial: 30 mmHg — CL (ref 83.0–108.0)

## 2020-05-17 LAB — COMPREHENSIVE METABOLIC PANEL
ALT: 84 U/L — ABNORMAL HIGH (ref 0–44)
AST: 84 U/L — ABNORMAL HIGH (ref 15–41)
Albumin: 3.4 g/dL — ABNORMAL LOW (ref 3.5–5.0)
Alkaline Phosphatase: 111 U/L (ref 38–126)
Anion gap: 20 — ABNORMAL HIGH (ref 5–15)
BUN: 42 mg/dL — ABNORMAL HIGH (ref 8–23)
CO2: 24 mmol/L (ref 22–32)
Calcium: 9.6 mg/dL (ref 8.9–10.3)
Chloride: 98 mmol/L (ref 98–111)
Creatinine, Ser: 1.57 mg/dL — ABNORMAL HIGH (ref 0.61–1.24)
GFR, Estimated: 46 mL/min — ABNORMAL LOW (ref >60)
Glucose, Bld: 183 mg/dL — ABNORMAL HIGH (ref 70–99)
Potassium: 4.1 mmol/L (ref 3.5–5.1)
Sodium: 142 mmol/L (ref 135–145)
Total Bilirubin: 0.8 mg/dL (ref 0.3–1.2)
Total Protein: 7 g/dL (ref 6.5–8.1)

## 2020-05-17 LAB — I-STAT CHEM 8, ED
BUN: 47 mg/dL — ABNORMAL HIGH (ref 8–23)
Calcium, Ion: 1.12 mmol/L — ABNORMAL LOW (ref 1.15–1.40)
Chloride: 102 mmol/L (ref 98–111)
Creatinine, Ser: 1.5 mg/dL — ABNORMAL HIGH (ref 0.61–1.24)
Glucose, Bld: 177 mg/dL — ABNORMAL HIGH (ref 70–99)
HCT: 44 % (ref 39.0–52.0)
Hemoglobin: 15 g/dL (ref 13.0–17.0)
Potassium: 4.1 mmol/L (ref 3.5–5.1)
Sodium: 138 mmol/L (ref 135–145)
TCO2: 26 mmol/L (ref 22–32)

## 2020-05-17 LAB — TROPONIN I (HIGH SENSITIVITY)
Troponin I (High Sensitivity): 23 ng/L — ABNORMAL HIGH (ref <18)
Troponin I (High Sensitivity): 27 ng/L — ABNORMAL HIGH (ref <18)

## 2020-05-17 LAB — CBC WITH DIFFERENTIAL/PLATELET
Abs Immature Granulocytes: 0 10*3/uL (ref 0.00–0.07)
Basophils Absolute: 0 10*3/uL (ref 0.0–0.1)
Basophils Relative: 0 %
Eosinophils Absolute: 0 10*3/uL (ref 0.0–0.5)
Eosinophils Relative: 0 %
HCT: 46.6 % (ref 39.0–52.0)
Hemoglobin: 14 g/dL (ref 13.0–17.0)
Lymphocytes Relative: 6 %
Lymphs Abs: 0.5 10*3/uL — ABNORMAL LOW (ref 0.7–4.0)
MCH: 28 pg (ref 26.0–34.0)
MCHC: 30 g/dL (ref 30.0–36.0)
MCV: 93.2 fL (ref 80.0–100.0)
Monocytes Absolute: 0.3 10*3/uL (ref 0.1–1.0)
Monocytes Relative: 4 %
Neutro Abs: 7.2 10*3/uL (ref 1.7–7.7)
Neutrophils Relative %: 90 %
Platelets: 317 10*3/uL (ref 150–400)
RBC: 5 MIL/uL (ref 4.22–5.81)
RDW: 13.5 % (ref 11.5–15.5)
WBC: 8 10*3/uL (ref 4.0–10.5)
nRBC: 0 % (ref 0.0–0.2)
nRBC: 0 /100 WBC

## 2020-05-17 LAB — LACTIC ACID, PLASMA: Lactic Acid, Venous: 7.1 mmol/L (ref 0.5–1.9)

## 2020-05-17 LAB — PROTIME-INR
INR: 1.8 — ABNORMAL HIGH (ref 0.8–1.2)
Prothrombin Time: 19.9 seconds — ABNORMAL HIGH (ref 11.4–15.2)

## 2020-05-17 LAB — BRAIN NATRIURETIC PEPTIDE: B Natriuretic Peptide: 226.3 pg/mL — ABNORMAL HIGH (ref 0.0–100.0)

## 2020-05-17 LAB — RESP PANEL BY RT-PCR (FLU A&B, COVID) ARPGX2
Influenza A by PCR: NEGATIVE
Influenza B by PCR: NEGATIVE
SARS Coronavirus 2 by RT PCR: NEGATIVE

## 2020-05-17 LAB — APTT: aPTT: 36 seconds (ref 24–36)

## 2020-05-17 MED ORDER — FLUTICASONE PROPIONATE 50 MCG/ACT NA SUSP: 2.0000 | Freq: Every day | NASAL | Status: DC | PRN

## 2020-05-17 MED ORDER — TIOTROPIUM BROMIDE MONOHYDRATE 2.5 MCG/ACT IN AERS: 2.0000 | INHALATION_SPRAY | Freq: Every day | RESPIRATORY_TRACT | Status: DC

## 2020-05-17 MED ORDER — IPRATROPIUM-ALBUTEROL 0.5-2.5 (3) MG/3ML IN SOLN
3.0000 mL | Freq: Three times a day (TID) | RESPIRATORY_TRACT | Status: DC
  Administered 2020-05-18 – 2020-05-19 (×4): 3 mL via RESPIRATORY_TRACT
  Filled 2020-05-17 (×3): qty 3

## 2020-05-17 MED ORDER — NITROGLYCERIN 0.4 MG SL SUBL: 0.4000 mg | SUBLINGUAL_TABLET | SUBLINGUAL | Status: DC | PRN

## 2020-05-17 MED ORDER — DILTIAZEM HCL ER COATED BEADS 360 MG PO CP24
360.0000 mg | ORAL_CAPSULE | Freq: Every day | ORAL | Status: DC
  Filled 2020-05-17 (×2): qty 1

## 2020-05-17 MED ORDER — HALOPERIDOL LACTATE 5 MG/ML IJ SOLN
3.0000 mg | Freq: Four times a day (QID) | INTRAMUSCULAR | Status: DC | PRN
  Administered 2020-05-18: 3 mg via INTRAMUSCULAR
  Filled 2020-05-17: qty 1

## 2020-05-17 MED ORDER — FLUTICASONE FUROATE-VILANTEROL 100-25 MCG/INH IN AEPB: 1.0000 | INHALATION_SPRAY | Freq: Every day | RESPIRATORY_TRACT | Status: DC

## 2020-05-17 MED ORDER — ENSURE ENLIVE PO LIQD
237.0000 mL | Freq: Three times a day (TID) | ORAL | Status: DC | PRN
  Filled 2020-05-17: qty 237

## 2020-05-17 MED ORDER — SALINE SPRAY 0.65 % NA SOLN: 1.0000 | NASAL | Status: DC | PRN

## 2020-05-17 MED ORDER — IPRATROPIUM BROMIDE 0.02 % IN SOLN
0.5000 mg | Freq: Four times a day (QID) | RESPIRATORY_TRACT | Status: DC
  Administered 2020-05-17: 0.5 mg via RESPIRATORY_TRACT
  Filled 2020-05-17: qty 2.5

## 2020-05-17 MED ORDER — APIXABAN 5 MG PO TABS: 5.0000 mg | ORAL_TABLET | Freq: Two times a day (BID) | ORAL | Status: DC

## 2020-05-17 MED ORDER — ALBUTEROL SULFATE (2.5 MG/3ML) 0.083% IN NEBU: 2.5000 mg | INHALATION_SOLUTION | RESPIRATORY_TRACT | Status: DC | PRN

## 2020-05-17 MED ORDER — GABAPENTIN 100 MG PO CAPS: 100.0000 mg | ORAL_CAPSULE | Freq: Two times a day (BID) | ORAL | Status: DC

## 2020-05-17 MED ORDER — ENOXAPARIN SODIUM 40 MG/0.4ML ~~LOC~~ SOLN
40.0000 mg | SUBCUTANEOUS | Status: DC
  Administered 2020-05-17: 40 mg via SUBCUTANEOUS
  Filled 2020-05-17: qty 0.4

## 2020-05-17 MED ORDER — SODIUM CHLORIDE 0.9 % IV SOLN
2.0000 g | Freq: Two times a day (BID) | INTRAVENOUS | Status: DC
  Administered 2020-05-17 – 2020-05-18 (×2): 2 g via INTRAVENOUS
  Filled 2020-05-17 (×2): qty 2

## 2020-05-17 MED ORDER — ALBUTEROL (5 MG/ML) CONTINUOUS INHALATION SOLN
10.0000 mg | INHALATION_SOLUTION | RESPIRATORY_TRACT | Status: AC
  Administered 2020-05-17: 10 mg via RESPIRATORY_TRACT
  Filled 2020-05-17: qty 20

## 2020-05-17 MED ORDER — NICOTINE 21 MG/24HR TD PT24: 21.0000 mg | MEDICATED_PATCH | Freq: Every day | TRANSDERMAL | Status: DC

## 2020-05-17 MED ORDER — SODIUM CHLORIDE 0.9 % IV SOLN: 2.0000 g | Freq: Three times a day (TID) | INTRAVENOUS | Status: DC

## 2020-05-17 MED ORDER — ALBUTEROL SULFATE HFA 108 (90 BASE) MCG/ACT IN AERS: 2.0000 | INHALATION_SPRAY | Freq: Four times a day (QID) | RESPIRATORY_TRACT | Status: DC | PRN

## 2020-05-17 MED ORDER — MIRTAZAPINE 15 MG PO TABS: 15.0000 mg | ORAL_TABLET | Freq: Every day | ORAL | Status: DC

## 2020-05-17 MED ORDER — TAMSULOSIN HCL 0.4 MG PO CAPS: 0.4000 mg | ORAL_CAPSULE | Freq: Every day | ORAL | Status: DC

## 2020-05-17 MED ORDER — ROSUVASTATIN CALCIUM 20 MG PO TABS: 20.0000 mg | ORAL_TABLET | Freq: Every day | ORAL | Status: DC

## 2020-05-17 MED ORDER — PREDNISONE 20 MG PO TABS: 40.0000 mg | ORAL_TABLET | Freq: Every day | ORAL | Status: DC

## 2020-05-17 MED ORDER — BUDESONIDE 0.5 MG/2ML IN SUSP
0.5000 mg | Freq: Two times a day (BID) | RESPIRATORY_TRACT | Status: DC
  Administered 2020-05-18 – 2020-05-19 (×4): 0.5 mg via RESPIRATORY_TRACT
  Filled 2020-05-17 (×4): qty 2

## 2020-05-17 MED ORDER — ALBUTEROL (5 MG/ML) CONTINUOUS INHALATION SOLN
10.0000 mg | INHALATION_SOLUTION | RESPIRATORY_TRACT | Status: AC
  Administered 2020-05-17: 10 mg via RESPIRATORY_TRACT

## 2020-05-17 MED ORDER — TIZANIDINE HCL 4 MG PO TABS
4.0000 mg | ORAL_TABLET | Freq: Three times a day (TID) | ORAL | Status: DC | PRN
  Filled 2020-05-17: qty 1

## 2020-05-17 MED ORDER — ISOSORBIDE MONONITRATE ER 30 MG PO TB24
30.0000 mg | ORAL_TABLET | Freq: Every day | ORAL | Status: DC
  Filled 2020-05-17 (×2): qty 1

## 2020-05-17 MED ORDER — METHYLPREDNISOLONE SODIUM SUCC 125 MG IJ SOLR
60.0000 mg | Freq: Four times a day (QID) | INTRAMUSCULAR | Status: AC
  Administered 2020-05-17 – 2020-05-18 (×4): 60 mg via INTRAVENOUS
  Filled 2020-05-17 (×4): qty 2

## 2020-05-17 MED ORDER — MIRTAZAPINE 15 MG PO TABS
30.0000 mg | ORAL_TABLET | Freq: Every day | ORAL | Status: DC
  Filled 2020-05-17: qty 1

## 2020-05-17 MED ORDER — INSULIN ASPART 100 UNIT/ML ~~LOC~~ SOLN: 0.0000 [IU] | Freq: Three times a day (TID) | SUBCUTANEOUS | Status: DC

## 2020-05-17 MED ORDER — SODIUM CHLORIDE 0.9 % IV SOLN: INTRAVENOUS | Status: DC

## 2020-05-17 MED ORDER — PANTOPRAZOLE SODIUM 40 MG PO TBEC: 40.0000 mg | DELAYED_RELEASE_TABLET | Freq: Every day | ORAL | Status: DC | PRN

## 2020-05-17 NOTE — Progress Notes (Signed)
Placed pt on BIPAP 20/8 35%.

## 2020-05-17 NOTE — Progress Notes (Signed)
IPAP increased on BIPAP due to CO2 still high on arterial blood gas.

## 2020-05-17 NOTE — ED Notes (Signed)
Date and time results received: 05/24/2020 5:51 PM  (use smartphrase ".now" to insert current time)  Test: lactic acid  Critical Value: 7.1  Name of Provider Notified: Hendly MD   Orders Received? Or Actions Taken?:

## 2020-05-17 NOTE — H&P (Signed)
History and Physical   John Marsh:[id number] DOB: 11/17/1945 DOA: 06-05-2020  Referring MD/NP/PA: Dr. Eber Hong  PCP: Eartha Inch, MD   Outpatient Specialists: None  Patient coming from: Home  Chief Complaint: Shortness of breath  HPI: John Marsh is a 74 y.o. male with medical history significant of COPD end-stage, coronary artery disease, atrial fibrillation, failure to thrive, chronic respiratory failure, peripheral vascular disease, iron deficiency anemia, essential hypertension and hyperlipidemia who was brought in by family due to worsening shortness of breath.  Patient apparently had gradual and then sudden shortness of breath wheeze and coughing.  He became so hypoxic that he passed out today.  EMS was called and found him in respiratory arrest.  This certainly resuscitate him was bagging put him on CPAP and brought into the ER where he is currently still confused not able to communicate.  He is currently on BiPAP.  History mainly from his family members and EMS.  Patient is fully anticoagulated from his A. fib with Eliquis.  He has received multiple treatments in the ER for COPD exacerbation currently on BiPAP.  He has shown some improvement since arrival and since being on the BiPAP but still hypoxic tachypneic and altered mental status.  He is a full code confirmed by family.  He is very hypercarbic and hypoxic on arrival.  Currently being admitted to progressive care on BiPAP for acute on chronic respiratory failure with hypoxia cardia and hypoxemia.  No identified trigger at this point.  ED Course: Patient blood pressure 158/82 pulse 110 respirate 33 initial oxygen sat 77% on room air.  ABG on BiPAP initially 7.206 PCO2 71.6 PO2 of 79.  Venous gas most recently showed PCO2 down to 69.9 and pH of 7.211.  Patient maintaining his sats.  He is on FiO2 of 35%.  CBC entirely within normal chemistry showed potassium 2.9 creatinine 1.5 and BUN 47 lactic acid 7.1 and INR 1.8  glucose 177.  Patient being admitted with sepsis secondary to possible pneumonia on x-ray.  The x-ray showed mainly emphysema with possible left lower lobe predominant no airspace opacities probably infectious.  Also severe's COPD exacerbation.  Review of Systems: As per HPI otherwise 10 point review of systems negative.    Past Medical History:  Diagnosis Date  . Acute on chronic respiratory failure with hypoxia (HCC) 09/19/2016  . Anemia   . Atrial fibrillation (HCC)    Chadsvasc2=3  . Chest pain 06/30/2016  . Chronic diastolic HF (heart failure) (HCC)   . Chronic respiratory failure with hypoxia (HCC) 07/03/2015  . COPD (chronic obstructive pulmonary disease) (HCC)   . COPD exacerbation (HCC) 05/07/2017  . COPD with acute exacerbation (HCC) 07/03/2015  . Coronary artery disease due to lipid rich plaque   . Coronary heart disease   . Dyspnea 07/15/2017  . Elevated troponin 09/19/2016  . Emphysema lung (HCC) 03/04/2015  . Emphysema of lung (HCC)   . Hyperlipidemia   . Hypertension   . Iron deficiency anemia   . MI (myocardial infarction) (HCC)   . Multiple lung nodules on CT   . Nocturnal hypoxia   . Peripheral arterial disease (HCC)   . PVD (peripheral vascular disease) with claudication (HCC) 07/27/2015  . Respiratory failure (HCC) 05/02/2017  . Shortness of breath 06/03/2015  . Shortness of breath dyspnea   . Tachycardia 07/03/2015  . Typical atrial flutter [facility])     Past Surgical History:  Procedure Laterality Date  . CHOLECYSTECTOMY    .  COLONOSCOPY WITH PROPOFOL N/A 07/19/2017   Procedure: COLONOSCOPY WITH PROPOFOL;  Surgeon: Rachael Fee, MD;  Location: [facility];  Service: Endoscopy;  Laterality: N/A;  . ESOPHAGOGASTRODUODENOSCOPY (EGD) WITH PROPOFOL N/A 07/19/2017   Procedure: ESOPHAGOGASTRODUODENOSCOPY (EGD) WITH PROPOFOL;  Surgeon: Rachael Fee, MD;  Location: [facility];  Service: Endoscopy;  Laterality: N/A;  . NASAL SEPTUM SURGERY    . PERIPHERAL VASCULAR  CATHETERIZATION N/A 07/27/2015   Procedure: Abdominal Aortogram;  Surgeon: Chuck Hint, MD;  Location: [facility] INVASIVE CV LAB;  Service: Cardiovascular;  Laterality: N/A;  . PERIPHERAL VASCULAR CATHETERIZATION Bilateral 07/27/2015   Procedure: Lower Extremity Angiography;  Surgeon: Chuck Hint, MD;  Location: [facility]VASIVE CV LAB;  Service: Cardiovascular;  Laterality: Bilateral;  . stent placed       reports that he has been smoking cigarettes. He started smoking about 60 years ago. He has a 13.50 pack-year smoking history. He has never used smokeless tobacco. He reports that he does not drink alcohol and does not use drugs.  No Known Allergies  Family History  Problem Relation Age of Onset  . Heart disease Mother        died at 51  . Heart disease Father   . Heart disease Sister        from rheumatic fever as a child  . Heart disease Paternal Grandfather      Prior to Admission medications   Medication Sig Start Date End Date Taking? Authorizing Provider  albuterol (PROVENTIL HFA;VENTOLIN HFA) 108 (90 Base) MCG/ACT inhaler Inhale 2 puffs into the lungs every 6 (six) hours as needed for wheezing or shortness of breath. 02/05/17  Yes Nestor, Donna Christen, MD  BREO ELLIPTA 100-25 MCG/INH AEPB Inhale 1 puff into the lungs daily. 05/06/20  Yes [provider]  budesonide (PULMICORT) 0.5 MG/2ML nebulizer solution Take 2 mLs (0.5 mg total) by nebulization 2 (two) times daily. 03/07/17  Yes Roslynn Amble, MD  diltiazem (CARDIZEM CD) 360 MG 24 hr capsule Take 1 capsule (360 mg total) daily by mouth. 05/07/17  Yes Rolly Salter, MD  ELIQUIS 5 MG TABS tablet TAKE 1 TABLET BY MOUTH TWICE A DAY 07/08/19  Yes Lyn Records, MD  feeding supplement, ENSURE ENLIVE, (ENSURE ENLIVE) LIQD Take 237 mLs by mouth 3 (three) times daily between meals. Patient taking differently: Take 237 mLs by mouth 3 (three) times daily as needed (nutrition).  09/26/16  Yes Narda Bonds, MD   fluticasone (FLONASE) 50 MCG/ACT nasal spray Place 2 sprays into both nostrils daily as needed for allergies or rhinitis. 09/26/16  Yes Narda Bonds, MD  gabapentin (NEURONTIN) 100 MG capsule Take 1 capsule (100 mg total) 2 (two) times daily by mouth. 05/06/17  Yes Rolly Salter, MD  isosorbide mononitrate (IMDUR) 30 MG 24 hr tablet TAKE 1 TABLET DAILY. MAKE OVERDUE APPT WITH DR. Katrinka Blazing BEFORE ANYMORE REFILLS. 3RD AND FINAL ATTEMPT Patient taking differently: Take 30 mg by mouth daily.  03/23/20  Yes Lyn Records, MD  mirtazapine (REMERON) 30 MG tablet Take 30 mg by mouth at bedtime. 11/24/19  Yes [provider]  nitroGLYCERIN (NITROSTAT) 0.4 MG SL tablet Place 0.4 mg under the tongue every 5 (five) minutes as needed for chest pain. x3 doses as needed for chest pain   Yes [provider]  pantoprazole (PROTONIX) 40 MG tablet Take 1 tablet (40 mg total) by mouth daily. Patient taking differently: Take 40 mg by mouth daily as needed (heartburn).  07/01/16  Yes Erick Blinks, MD  rosuvastatin (CRESTOR) 20 MG tablet Take 20 mg by mouth at bedtime.   Yes [provider]  sodium chloride (OCEAN) 0.65 % SOLN nasal spray Place 1 spray as needed into both nostrils for congestion (nose irritation). 05/06/17  Yes Rolly Salter, MD  SPIRIVA RESPIMAT 2.5 MCG/ACT AERS INHALE 2 PUFFS INTO THE LUNGS EVERY DAY Patient taking differently: Take 2 puffs by mouth daily.  11/27/18  Yes Lupita Leash, MD  tamsulosin (FLOMAX) 0.4 MG CAPS capsule Take 0.4 mg by mouth daily.  06/04/18  Yes [provider]  tiZANidine (ZANAFLEX) 4 MG tablet Take 4 mg by mouth every 8 (eight) hours as needed for muscle spasms.  08/12/18  Yes [provider]  formoterol (PERFOROMIST) 20 MCG/2ML nebulizer solution Take 2 mLs (20 mcg total) by nebulization 2 (two) times daily. DX code: J43.8 Patient not taking: Reported on 04/22/2020 08/31/17   Lupita Leash, MD  mirtazapine (REMERON) 15  MG tablet Take 15 mg by mouth at bedtime. 04/03/18 04/03/19  [provider]  Spacer/Aero-Holding Chambers (AEROCHAMBER Z-STAT PLUS) inhaler Use as instructed 03/04/15   Roslynn Amble, MD    Physical Exam: Vitals:   04/19/2020 2158 04/22/2020 2215 05/05/2020 2230 04/26/2020 2300  BP:  139/84 137/90 124/84  Pulse: 100 98 98 93  Resp: (!) 31 (!) 31 (!) 28 (!) 24  SpO2: 95% 96% 96% 94%  Weight:      Height:          Constitutional: Confused, tachypneic, chronically ill looking with cachexia Vitals:   05/18/2020 2158 04/27/2020 2215 05/07/2020 2230 04/23/2020 2300  BP:  139/84 137/90 124/84  Pulse: 100 98 98 93  Resp: (!) 31 (!) 31 (!) 28 (!) 24  SpO2: 95% 96% 96% 94%  Weight:      Height:       Eyes: PERRL, lids and conjunctivae normal ENMT: Mucous membranes are dry. Posterior pharynx clear of any exudate or lesions.Normal dentition.  Neck: normal, supple, no masses, no thyromegaly Respiratory: Decreased air entry with mild expiratory wheezing, tachypneic. Normal respiratory effort. No accessory muscle use.  Cardiovascular: Sinus tachycardia, no murmurs / rubs / gallops. No extremity edema. 2+ pedal pulses. No carotid bruits.  Abdomen: no tenderness, no masses palpated. No hepatosplenomegaly. Bowel sounds positive.  Musculoskeletal: no clubbing / cyanosis. No joint deformity upper and lower extremities. Good ROM, no contractures. Normal muscle tone.  Skin: no rashes, lesions, ulcers. No induration Neurologic: CN 2-12 grossly intact. Sensation intact, DTR normal. Strength 5/5 in all 4.  Psychiatric: Confused, respond to [place] but drowsy.     Labs on Admission: I have personally reviewed following labs and imaging studies  CBC: Recent Labs  Lab 04/29/2020 1633 05/10/2020 1659 04/19/2020 1742 05/15/2020 2314  WBC 8.0  --   --   --   NEUTROABS 7.2  --   --   --   HGB 14.0 15.0 14.6 15.3  HCT 46.6 44.0 43.0 45.0  MCV 93.2  --   --   --   PLT 317  --   --   --    Basic Metabolic  Panel: Recent Labs  Lab 05/01/2020 1633 05/11/2020 1659 05/02/2020 1742 05/13/2020 2314  NA 142 138 139 140  K 4.1 4.1 3.9 2.9*  CL 98 102  --   --   CO2 24  --   --   --   GLUCOSE 183* 177*  --   --  BUN 42* 47*  --   --   CREATININE 1.57* 1.50*  --   --   CALCIUM 9.6  --   --   --    GFR: Estimated Creatinine Clearance: 33 mL/min (A) (by C-G formula based on SCr of 1.5 mg/dL (H)). Liver Function Tests: Recent Labs  Lab 2020-05-03 1633  AST 84*  ALT 84*  ALKPHOS 111  BILITOT 0.8  PROT 7.0  ALBUMIN 3.4*   No results for input(s): LIPASE, AMYLASE in the last 168 hours. No results for input(s): AMMONIA in the last 168 hours. Coagulation Profile: Recent Labs  Lab 2020-05-03 1633  INR 1.8*   Cardiac Enzymes: No results for input(s): CKTOTAL, CKMB, CKMBINDEX, TROPONINI in the last 168 hours. BNP (last 3 results) No results for input(s): PROBNP in the last 8760 hours. HbA1C: No results for input(s): HGBA1C in the last 72 hours. CBG: No results for input(s): GLUCAP in the last 168 hours. Lipid Profile: No results for input(s): CHOL, HDL, LDLCALC, TRIG, CHOLHDL, LDLDIRECT in the last 72 hours. Thyroid Function Tests: No results for input(s): TSH, T4TOTAL, FREET4, T3FREE, THYROIDAB in the last 72 hours. Anemia Panel: No results for input(s): VITAMINB12, FOLATE, FERRITIN, TIBC, IRON, RETICCTPCT in the last 72 hours. Urine analysis:    Component Value Date/Time   COLORURINE YELLOW 05/02/2017 1427   APPEARANCEUR CLEAR 05/02/2017 1427   LABSPEC 1.010 05/02/2017 1427   PHURINE 6.5 05/02/2017 1427   GLUCOSEU 100 (A) 05/02/2017 1427   HGBUR MODERATE (A) 05/02/2017 1427   BILIRUBINUR NEGATIVE 05/02/2017 1427   KETONESUR NEGATIVE 05/02/2017 1427   PROTEINUR NEGATIVE 05/02/2017 1427   UROBILINOGEN 0.2 08/08/2012 1910   NITRITE NEGATIVE 05/02/2017 1427   LEUKOCYTESUR NEGATIVE 05/02/2017 1427   Sepsis Labs: @LABRCNTIP (procalcitonin:4,lacticidven:4) ) Recent Results (from the  past 240 hour(s))  Resp Panel by RT-PCR (Flu A&B, Covid) Nasopharyngeal Swab     Status: None   Collection Time: 2020-05-03  4:49 PM   Specimen: Nasopharyngeal Swab; Nasopharyngeal(NP) swabs in vial transport medium  Result Value Ref Range Status   SARS Coronavirus 2 by RT PCR NEGATIVE NEGATIVE Final    Comment: (NOTE) SARS-CoV-2 target nucleic acids are NOT DETECTED.  The SARS-CoV-2 RNA is generally detectable in upper respiratory specimens during the acute phase of infection. The lowest concentration of SARS-CoV-2 viral copies this assay can detect is 138 copies/mL. A negative result does not preclude SARS-Cov-2 infection and should not be used as the sole basis for treatment or other patient management decisions. A negative result may occur with  improper specimen collection/handling, submission of specimen other than nasopharyngeal swab, presence of viral mutation(s) within the areas targeted by this assay, and inadequate number of viral copies(<138 copies/mL). A negative result must be combined with clinical observations, patient history, and epidemiological information. The expected result is Negative.  Fact Sheet for Patients:  BloggerCourse.comhttps://www.fda.gov/media/152166/download  Fact Sheet for Healthcare Providers:  SeriousBroker.ithttps://www.fda.gov/media/152162/download  This test is no t yet approved or cleared by the [place] FDA and  has been authorized for detection and/or diagnosis of SARS-CoV-2 by FDA under an Emergency Use Authorization (EUA). This EUA will remain  in effect (meaning this test can be used) for the duration of the COVID-19 declaration under Section 564(b)(1) of the Act, 21 U.S.C.section 360bbb-3(b)(1), unless the authorization is terminated  or revoked sooner.       Influenza A by PCR NEGATIVE NEGATIVE Final   Influenza B by PCR NEGATIVE NEGATIVE Final    Comment: (NOTE) The Xpert  Xpress SARS-CoV-2/FLU/RSV plus assay is intended as an aid in the diagnosis of  influenza from Nasopharyngeal swab specimens and should not be used as a sole basis for treatment. Nasal washings and aspirates are unacceptable for Xpert Xpress SARS-CoV-2/FLU/RSV testing.  Fact Sheet for Patients: BloggerCourse.com  Fact Sheet for Healthcare Providers: SeriousBroker.it  This test is not yet approved or cleared by the [place] FDA and has been authorized for detection and/or diagnosis of SARS-CoV-2 by FDA under an Emergency Use Authorization (EUA). This EUA will remain in effect (meaning this test can be used) for the duration of the COVID-19 declaration under Section 564(b)(1) of the Act, 21 U.S.C. section 360bbb-3(b)(1), unless the authorization is terminated or revoked.  Performed at [facility], [address], [place], [place] [address]      Radiological Exams on Admission: DG Chest Port 1 View  Result Date: 05/30/2020 CLINICAL DATA:  Cough shortness of breath EXAM: PORTABLE CHEST 1 VIEW COMPARISON:  Chest radiograph October 03, 2017 and chest CT July 24, 2017. FINDINGS: The heart size and mediastinal contours are within normal limits. Atherosclerotic calcifications aorta. Extensive emphysematous change with scattered areas of fibrosis. New hazy left lower lobe predominant interstitial and airspace opacities. The visualized skeletal structures are unremarkable. IMPRESSION: Emphysematous change with scattered areas of fibrosis now with superimposed hazy left lower lobe predominant interstitial and airspace opacities concerning for infection. Electronically Signed   By: Maudry Mayhew MD   On: May 30, 2020 17:43    EKG: Independently reviewed.  Sinus tachycardia sinus tachycardia  Assessment/Plan Principal Problem:   Acute on chronic respiratory failure with hypoxia and hypercapnia (HCC) Active Problems:   Diabetes mellitus without complication (HCC)   HLD (hyperlipidemia)   TOBACCO DEPENDENCE    Essential hypertension   CAD (coronary artery disease), native coronary artery   COPD (chronic obstructive pulmonary disease) with emphysema (HCC)   Lobar pneumonia (HCC)   Chronic diastolic HF (heart failure) (HCC)     #1 acute on chronic respiratory failure with hypoxia and hypercarbia: Most likely secondary to advanced COPD with exacerbation.  Could likely be treated by mild left lobar pneumonia.  Patient will be admitted to progressive care.  He seems to be improving on the BiPAP but if he fails might require intubation ultimately and transferred to the ICU.  In the meantime continue steroids nebulizer treatments oxygen as well as supportive care.  He has been Covid 19 -.  #2 acute exacerbation of COPD: Continue as per above.  #3 ischemic cardiomyopathy with CHF, diastolic in nature.  Continue diuresis as needed.  Does not appear to be in CHF exacerbation in the morning  #4 diabetes: Sliding scale insulin  #5 tobacco abuse: We will add nicotine patch.  #6 hyperlipidemia: Continue home regimen  #7 altered mental status: Most likely due to hypercarbia.  Continue monitoring   DVT prophylaxis: Lovenox Code Status: Full code Family Communication: Wife at bedside Disposition Plan: Home Consults called: None Admission status: Inpatient  Severity of Illness: The appropriate patient status for this patient is INPATIENT. Inpatient status is judged to be reasonable and necessary in order to provide the required intensity of service to ensure the patient's safety. The patient's presenting symptoms, physical exam findings, and initial radiographic and laboratory data in the context of their chronic comorbidities is felt to place them at high risk for further clinical deterioration. Furthermore, it is not anticipated that the patient will be medically stable for discharge from the hospital within 2 midnights of  admission. The following factors support the patient status of inpatient.   " The  patient's presenting symptoms include shortness of breath and altered mental status. " The worrisome physical exam findings include confusion and tachypnea. " The initial radiographic and laboratory data are worrisome because of ABG showed respiratory acidosis. " The chronic co-morbidities include history of COPD.   * I certify that at the point of admission it is my clinical judgment that the patient will require inpatient hospital care spanning beyond 2 midnights from the point of admission due to high intensity of service, high risk for further deterioration and high frequency of surveillance required.Lonia Blood MD [facility] Pager [phone]  If 7PM-7AM, please contact night-coverage [email or web address] Password [facility]  05/16/2020, 11:25 PM

## 2020-05-17 NOTE — ED Provider Notes (Addendum)
[facility] EMERGENCY DEPARTMENT Provider Note   CSN: [id number] Arrival date & time: May 19, 2020  1624     History Chief Complaint  Patient presents with  . Respiratory Distress    John Marsh is a 74 y.o. male with a PMH of diabetes, hyperlipidemia, tobacco use, hypertension, COPD, CAD, pulmonary nodules, PVD, A. Fib on Eliquis, and diastolic heart failure presents with severe respiratory distress.  Per EMS, patient went to the bathroom and then when he came out "collapsed".  Per EMS, when first responders arrived on scene, he was apneic and was being bagged.  Did not lose pulses.  When EMS arrived, started CPAP and by this point patient had spontaneous respirations although with increased work of breathing.  Received 2 g of mag, 125 mg Solu-Medrol, and 0.3 mg epinephrine in route.  No breathing treatments given.  On arrival, patient drowsy but opens eyes to speech, follows simple commands.  Unsuccessful attempt to reach Marrion Coy, listed as spouse in EMR, at [phone] (listed in demographics) to clarify code status and obtain more history.  The history is provided by the EMS personnel. The history is limited by the condition of the patient.  Shortness of Breath Severity:  Severe Onset quality:  Sudden Timing:  Constant Progression:  Improving (initially being bagged, now on CPAP) Chronicity:  New Relieved by:  Nothing Worsened by:  Nothing Ineffective treatments: BVM, mag 2g, solumedrol 125mg , 0.3mg  epi, CPAP.      Past Medical History:  Diagnosis Date  . Acute on chronic respiratory failure with hypoxia (HCC) 09/19/2016  . Anemia   . Atrial fibrillation (HCC)    Chadsvasc2=3  . Chest pain 06/30/2016  . Chronic diastolic HF (heart failure) (HCC)   . Chronic respiratory failure with hypoxia (HCC) 07/03/2015  . COPD (chronic obstructive pulmonary disease) (HCC)   . COPD exacerbation (HCC) 05/07/2017  . COPD with acute exacerbation (HCC) 07/03/2015  .  Coronary artery disease due to lipid rich plaque   . Coronary heart disease   . Dyspnea 07/15/2017  . Elevated troponin 09/19/2016  . Emphysema lung (HCC) 03/04/2015  . Emphysema of lung (HCC)   . Hyperlipidemia   . Hypertension   . Iron deficiency anemia   . MI (myocardial infarction) (HCC)   . Multiple lung nodules on CT   . Nocturnal hypoxia   . Peripheral arterial disease (HCC)   . PVD (peripheral vascular disease) with claudication (HCC) 07/27/2015  . Respiratory failure (HCC) 05/02/2017  . Shortness of breath 06/03/2015  . Shortness of breath dyspnea   . Tachycardia 07/03/2015  . Typical atrial flutter [facility])     Patient Active Problem List   Diagnosis Date Noted  . Hypochromic microcytic anemia 10/10/2017  . Iron deficiency anemia 10/10/2017  . Chronic diastolic HF (heart failure) (HCC)   . Bronchiectasis with acute exacerbation (HCC)   . Typical atrial flutter (HCC)   . Lobar pneumonia (HCC) 05/01/2017  . Protein-calorie malnutrition, severe 09/21/2016  . PVD (peripheral vascular disease) with claudication (HCC) 07/27/2015  . Pulmonary nodules 08/10/2014  . Diabetes mellitus without complication (HCC) 08/16/2006  . HLD (hyperlipidemia) 08/16/2006  . TOBACCO DEPENDENCE 08/16/2006  . Essential hypertension 08/16/2006  . CAD (coronary artery disease), native coronary artery 08/16/2006  . COPD (chronic obstructive pulmonary disease) with emphysema (HCC) 08/16/2006    Past Surgical History:  Procedure Laterality Date  . CHOLECYSTECTOMY    . COLONOSCOPY WITH PROPOFOL N/A 07/19/2017   Procedure: COLONOSCOPY WITH PROPOFOL;  Surgeon:  Rachael FeeJacobs, Daniel P, MD;  [facility];  Service: Endoscopy;  Laterality: N/A;  . ESOPHAGOGASTRODUODENOSCOPY (EGD) WITH PROPOFOL N/A 07/19/2017   Procedure: ESOPHAGOGASTRODUODENOSCOPY (EGD) WITH PROPOFOL;  Surgeon: Rachael FeeJacobs, Daniel P, MD;  Location: [facility]OS[facility];  Service: Endoscopy;  Laterality: N/A;  . NASAL SEPTUM SURGERY    . PERIPHERAL  VASCULAR CATHETERIZATION N/A 07/27/2015   Procedure: Abdominal Aortogram;  Surgeon: Chuck Hinthristopher S Dickson, MD;  [facility];  Service: Cardiovascular;  Laterality: N/A;  . PERIPHERAL VASCULAR CATHETERIZATION Bilateral 07/27/2015   Procedure: Lower Extremity Angiography;  Surgeon: Chuck Hinthristopher S Dickson, MD;  [facility];  Service: Cardiovascular;  Laterality: Bilateral;  . stent placed         Family History  Problem Relation Age of Onset  . Heart disease Mother        died at 7157  . Heart disease Father   . Heart disease Sister        from rheumatic fever as a child  . Heart disease Paternal Grandfather     Social History   Tobacco Use  . Smoking status: Current Every Day Smoker    Packs/day: 0.25    Years: 54.00    Pack years: 13.50    Types: Cigarettes    Start date: 08/01/1959  . Smokeless tobacco: Never Used  . Tobacco comment: smokes 4-5 cigerettes/day  Vaping Use  . Vaping Use: Never used  Substance Use Topics  . Alcohol use: No    Alcohol/week: 0.0 standard drinks  . Drug use: No    Home Medications Prior to Admission medications   Medication Sig Start Date End Date Taking? Authorizing Provider  acetaminophen (TYLENOL) 325 MG tablet Take 650 mg by mouth every 6 (six) hours as needed for mild pain.    [provider]  albuterol (PROVENTIL HFA;VENTOLIN HFA) 108 (90 Base) MCG/ACT inhaler Inhale 2 puffs into the lungs every 6 (six) hours as needed for wheezing or shortness of breath. 02/05/17   Roslynn AmbleNestor, Jennings E, MD  budesonide (PULMICORT) 0.5 MG/2ML nebulizer solution Take 2 mLs (0.5 mg total) by nebulization 2 (two) times daily. 03/07/17   Roslynn AmbleNestor, Jennings E, MD  diltiazem (CARDIZEM CD) 360 MG 24 hr capsule Take 1 capsule (360 mg total) daily by mouth. 05/07/17   Rolly SalterPatel, Pranav M, MD  ELIQUIS 5 MG TABS tablet TAKE 1 TABLET BY MOUTH TWICE A DAY 07/08/19   Lyn RecordsSmith, Henry W, MD  feeding supplement, ENSURE ENLIVE, (ENSURE ENLIVE) LIQD  Take 237 mLs by mouth 3 (three) times daily between meals. 09/26/16   Narda BondsNettey, Ralph A, MD  fluticasone (FLONASE) 50 MCG/ACT nasal spray Place 2 sprays into both nostrils daily as needed for allergies or rhinitis. 09/26/16   Narda BondsNettey, Ralph A, MD  formoterol (PERFOROMIST) 20 MCG/2ML nebulizer solution Take 2 mLs (20 mcg total) by nebulization 2 (two) times daily. DX code: J43.8 08/31/17   Lupita LeashMcQuaid, Douglas B, MD  gabapentin (NEURONTIN) 100 MG capsule Take 1 capsule (100 mg total) 2 (two) times daily by mouth. 05/06/17   Rolly SalterPatel, Pranav M, MD  isosorbide mononitrate (IMDUR) 30 MG 24 hr tablet TAKE 1 TABLET DAILY. MAKE OVERDUE APPT WITH DR. Katrinka BlazingSMITH BEFORE ANYMORE REFILLS. 3RD AND FINAL ATTEMPT 03/23/20   Lyn RecordsSmith, Henry W, MD  mirtazapine (REMERON) 15 MG tablet Take 15 mg by mouth at bedtime. 04/03/18 04/03/19  [provider]  nitroGLYCERIN (NITROSTAT) 0.4 MG SL tablet Place 0.4 mg under the tongue every 5 (five) minutes as needed for  chest pain. x3 doses as needed for chest pain    [provider]  pantoprazole (PROTONIX) 40 MG tablet Take 1 tablet (40 mg total) by mouth daily. 07/01/16   Erick Blinks, MD  pramipexole (MIRAPEX) 0.125 MG tablet Take 1 tablet by mouth at bedtime. 06/21/16   [provider]  rosuvastatin (CRESTOR) 20 MG tablet Take 20 mg by mouth at bedtime.    [provider]  sodium chloride (OCEAN) 0.65 % SOLN nasal spray Place 1 spray as needed into both nostrils for congestion (nose irritation). 05/06/17   Rolly Salter, MD  Spacer/Aero-Holding Chambers (AEROCHAMBER Z-STAT PLUS) inhaler Use as instructed 03/04/15   Roslynn Amble, MD  SPIRIVA RESPIMAT 2.5 MCG/ACT AERS INHALE 2 PUFFS INTO THE LUNGS EVERY DAY 11/27/18   Lupita Leash, MD  tamsulosin (FLOMAX) 0.4 MG CAPS capsule Take 0.4 mg by mouth daily.  06/04/18   [provider]  tiZANidine (ZANAFLEX) 4 MG tablet Take 4 mg by mouth 3 (three) times daily.  08/12/18   [provider]     Allergies    Patient has no known allergies.  Review of Systems   Review of Systems  Unable to perform ROS: Severe respiratory distress  Respiratory: Positive for shortness of breath.     Physical Exam Updated Vital Signs BP (!) 150/79 (BP Location: Left Arm)   Pulse (!) 110   SpO2 95%   Physical Exam Constitutional:      General: He is in acute distress.     Appearance: He is underweight. He is ill-appearing and toxic-appearing. He is not diaphoretic.     Interventions: He is not intubated.    Comments: Appears drowsy but is arousable, follows simple commands  HENT:     Head: Normocephalic and atraumatic.     Right Ear: External ear normal.     Left Ear: External ear normal.     Nose: Nose normal. No rhinorrhea.     Mouth/Throat:     Mouth: Mucous membranes are dry.  Eyes:     General: No scleral icterus.    Conjunctiva/sclera: Conjunctivae normal.     Pupils: Pupils are equal, round, and reactive to light.  Cardiovascular:     Rate and Rhythm: Regular rhythm. Tachycardia present.     Pulses: Normal pulses.     Heart sounds: Normal heart sounds.  Pulmonary:     Effort: Tachypnea, accessory muscle usage, respiratory distress and retractions present. He is not intubated.     Breath sounds: Decreased air movement (severely decreased breath sounds bilaterally) present. Decreased breath sounds present. No wheezing, rhonchi or rales.  Abdominal:     General: Abdomen is flat. There is no distension.     Tenderness: There is no abdominal tenderness.  Musculoskeletal:        General: No deformity or signs of injury.  Skin:    General: Skin is warm and dry.  Neurological:     Mental Status: He is confused.     GCS: GCS eye subscore is 3. GCS verbal subscore is 1. GCS motor subscore is 6.     ED Results / Procedures / Treatments   Labs (all labs ordered are listed, but only abnormal results are displayed) Labs Reviewed  RESP PANEL BY RT-PCR (FLU A&B, COVID) ARPGX2   BLOOD GAS, ARTERIAL  CBC WITH DIFFERENTIAL/PLATELET  COMPREHENSIVE METABOLIC PANEL  BRAIN NATRIURETIC PEPTIDE  TROPONIN I (HIGH SENSITIVITY)    EKG None  Radiology No results found.  Procedures Procedures (including critical care time)  Medications Ordered in ED Medications  albuterol (PROVENTIL,VENTOLIN) solution continuous neb (has no administration in time range)    ED Course  I have reviewed the triage vital signs and the nursing notes.  Pertinent labs & imaging results that were available during my care of the patient were reviewed by me and considered in my medical decision making (see chart for details).    MDM Rules/Calculators/A&P                          MDM: John Marsh is a 74 y.o. male who presents with SOB as per above. I have reviewed the nursing documentation for past medical history, family history, and social history. Pertinent previous records reviewed. He is awake, alert. Hypoxic to 85%, increased to 98% with BiPAP. Normotensive. Afebrile. Physical exam is most notable for increased WOB, tachypneic.  Labs: ABG 7.206/71.6/79, lactic 7.1, BNP 226.3, trop 23, AG 20, Cr 1.57.  CBC unremarkable.  Covid negative. EKG: Sinus tachycardia. QTc, PR, and QRS within appropriate limits. No signs of acute ischemia, infarct, or significant electrical abnormalities. No STEMI, ST depressions, or significant T wave inversions. No evidence of a High-Grade Conduction Block, WPW, Brugada Sign, ARVC, DeWinters T Waves, or Wellens Waves. Imaging: CXR demonstrating emphysematous changes with scattered areas of fibrosis with superimposed hazy left lower lobe, interstitial and airspace opacities concerning for infection. Consults: none Tx: albuterol  Differential Dx: I am most concerned for severe hypoxic hypercarbic acute respiratory failure likely related to COPD exacerbation. Given history, physical exam, and work-up, I do not think he has ACS, PE, pneumothorax, cardiac  tamponade, esophageal pathology, bowel perforation, drug ingestion, or trauma.  MDM: John Marsh is a 74 y.o. male with a history of COPD and CAD presents with acute on chronic hypoxic hypercarbic respiratory failure.  Placed on CPAP by EMS patient was placed on BiPAP on ED arrival.  Patient initially drowsy although arouses to voice and able to follow simple commands.  ABG collected after 1 hour of BiPAP.  On multiple reassessments, mental status appeared to be improving.  FiO2 decreased from 100% to 35%, satting in the 90s.  Work of breathing improving with BiPAP and continuous albuterol.  Afebrile, normotensive.  Treating primarily for COPD exacerbation with continuous albuterol, antibiotics deferred until cultures obtained and for hospitalist recommendations.  Patient not in septic shock so not given broad-spectrum antibiotics.  Handoff given to Dr. Mikeal Hawthorne (hospitalist medicine).  Admitted in stable but tenuous condition on BiPAP.  Mental status appropriate for BiPAP at time of admission.  Protecting airway, intubation not indicated at this time. Wife at bedside.  The plan for this patient was discussed with Dr. Hyacinth Meeker, who voiced agreement and who oversaw evaluation and treatment of this patient.   Final Clinical Impression(s) / ED Diagnoses Final diagnoses:  None    Rx / DC Orders ED Discharge Orders    None       John Cromie, MD 06-12-2020 John Marsh    Gershon Mussel, MD 06/12/2020 [id number]    Eber Hong, MD 06/17/2020 [phone]

## 2020-05-17 NOTE — ED Provider Notes (Signed)
I saw and evaluated the patient, reviewed the resident's note and I agree with the findings and plan.  Pertinent History: Patient is a critically ill-appearing 74 year old male presenting with shortness of breath, according to family at the scene the patient had increasing shortness of breath throughout the day, went into distress and ultimately lost consciousness falling at home.  When paramedics arrived he was in respiratory arrest, they did bag-valve-mask and on the ambulance the patient came around and they switched to CPAP, he was able to follow some simple commands and answer simple questions such as his name.  He still is obtunded, and having difficulty breathing, requiring CPAP, poorly oxygenating and tachycardic.  Review of the medical record shows that he does have congestive heart failure with grade 2 diastolic dysfunction, he has known coronary disease, he is on Eliquis for atrial fibrillation, he also likely has COPD based on the medications he takes at home such as inhalers.  He was given 0.3 mg of epi, 2 g of magnesium and 125 mg of Solu-Medrol by paramedics.  No bronchodilators were given prehospital.  Pertinent Exam findings: This patient is ill-appearing, respiratory distress, insufficient respiratory depth, decreased air movement bilaterally, no audible wheezing or rales, virtually no breath sounds.  He is breathing spontaneously, he does open his eyes, he will squeeze hands, he has no edema, soft abdomen, mild tachycardia.  The patient received multiple albuterol nebulizer treatments including back-to-back continuous nebulizer therapy.  He is currently on BiPAP, he is more awake, his oxygen level is doing well on the BiPAP, his carbon dioxide level has improved and is now down to the mid 70s, he is acidotic with a pH of 7.2 all consistent with respiratory acidosis.  We will arrange for admission to high level of care, likely needs stepdown at this time.  I was personally present and  directly supervised the following procedures:  Medical resuscitation Respiratory distress with BiPAP Altered mental status  .Critical Care Performed by: Eber Hong, MD Authorized by: Eber Hong, MD   Critical care provider statement:    Critical care time (minutes):  35   Critical care time was exclusive of:  Separately billable procedures and treating other patients and teaching time   Critical care was necessary to treat or prevent imminent or life-threatening deterioration of the following conditions:  Respiratory failure   Critical care was time spent personally by me on the following activities:  Blood draw for specimens, development of treatment plan with patient or surrogate, discussions with consultants, evaluation of patient's response to treatment, examination of patient, obtaining history from patient or surrogate, ordering and performing treatments and interventions, ordering and review of laboratory studies, ordering and review of radiographic studies, pulse oximetry, re-evaluation of patient's condition and review of old charts    I personally interpreted the EKG as well as the resident and agree with the interpretation on the resident's chart.  Final diagnoses:  Acute on chronic respiratory failure with hypoxia and hypercapnia (HCC)      Eber Hong, MD 06-17-2020 1918

## 2020-05-17 NOTE — Progress Notes (Signed)
ABG results reported to Eber Hong, MD. No new orders.

## 2020-05-17 NOTE — ED Triage Notes (Signed)
Per ems they were called out for respiratory arrest. Per family pt had been having difficulty breathing today and came out the bathroom and collapsed. Pt on CPAP machine with ems. Per ems pt can only answer simple questions.

## 2020-05-18 ENCOUNTER — Inpatient Hospital Stay (HOSPITAL_COMMUNITY): Payer: Medicare Other

## 2020-05-18 DIAGNOSIS — J438 Other emphysema: Secondary | ICD-10-CM

## 2020-05-18 DIAGNOSIS — R0602 Shortness of breath: Secondary | ICD-10-CM

## 2020-05-18 DIAGNOSIS — I2510 Atherosclerotic heart disease of native coronary artery without angina pectoris: Secondary | ICD-10-CM | POA: Diagnosis not present

## 2020-05-18 DIAGNOSIS — J9690 Respiratory failure, unspecified, unspecified whether with hypoxia or hypercapnia: Secondary | ICD-10-CM | POA: Diagnosis present

## 2020-05-18 DIAGNOSIS — J9621 Acute and chronic respiratory failure with hypoxia: Secondary | ICD-10-CM

## 2020-05-18 DIAGNOSIS — J9622 Acute and chronic respiratory failure with hypercapnia: Secondary | ICD-10-CM | POA: Diagnosis not present

## 2020-05-18 DIAGNOSIS — L8990 Pressure ulcer of unspecified site, unspecified stage: Secondary | ICD-10-CM | POA: Insufficient documentation

## 2020-05-18 LAB — BLOOD CULTURE ID PANEL (REFLEXED) - BCID2
A.calcoaceticus-baumannii: NOT DETECTED
A.calcoaceticus-baumannii: NOT DETECTED
Bacteroides fragilis: NOT DETECTED
Bacteroides fragilis: NOT DETECTED
Candida albicans: NOT DETECTED
Candida albicans: NOT DETECTED
Candida auris: NOT DETECTED
Candida auris: NOT DETECTED
Candida glabrata: NOT DETECTED
Candida glabrata: NOT DETECTED
Candida krusei: NOT DETECTED
Candida krusei: NOT DETECTED
Candida parapsilosis: NOT DETECTED
Candida parapsilosis: NOT DETECTED
Candida tropicalis: NOT DETECTED
Candida tropicalis: NOT DETECTED
Cryptococcus neoformans/gattii: NOT DETECTED
Cryptococcus neoformans/gattii: NOT DETECTED
Enterobacter cloacae complex: NOT DETECTED
Enterobacter cloacae complex: NOT DETECTED
Enterobacterales: NOT DETECTED
Enterobacterales: NOT DETECTED
Enterococcus Faecium: NOT DETECTED
Enterococcus Faecium: NOT DETECTED
Enterococcus faecalis: NOT DETECTED
Enterococcus faecalis: NOT DETECTED
Escherichia coli: NOT DETECTED
Escherichia coli: NOT DETECTED
Haemophilus influenzae: NOT DETECTED
Haemophilus influenzae: NOT DETECTED
Klebsiella aerogenes: NOT DETECTED
Klebsiella aerogenes: NOT DETECTED
Klebsiella oxytoca: NOT DETECTED
Klebsiella oxytoca: NOT DETECTED
Klebsiella pneumoniae: NOT DETECTED
Klebsiella pneumoniae: NOT DETECTED
Listeria monocytogenes: NOT DETECTED
Listeria monocytogenes: NOT DETECTED
Neisseria meningitidis: NOT DETECTED
Neisseria meningitidis: NOT DETECTED
Proteus species: NOT DETECTED
Proteus species: NOT DETECTED
Pseudomonas aeruginosa: NOT DETECTED
Pseudomonas aeruginosa: NOT DETECTED
Salmonella species: NOT DETECTED
Salmonella species: NOT DETECTED
Serratia marcescens: NOT DETECTED
Serratia marcescens: NOT DETECTED
Staphylococcus aureus (BCID): NOT DETECTED
Staphylococcus aureus (BCID): NOT DETECTED
Staphylococcus epidermidis: NOT DETECTED
Staphylococcus epidermidis: NOT DETECTED
Staphylococcus lugdunensis: NOT DETECTED
Staphylococcus lugdunensis: NOT DETECTED
Staphylococcus species: NOT DETECTED
Staphylococcus species: NOT DETECTED
Stenotrophomonas maltophilia: NOT DETECTED
Stenotrophomonas maltophilia: NOT DETECTED
Streptococcus agalactiae: NOT DETECTED
Streptococcus agalactiae: NOT DETECTED
Streptococcus pneumoniae: NOT DETECTED
Streptococcus pneumoniae: NOT DETECTED
Streptococcus pyogenes: NOT DETECTED
Streptococcus pyogenes: NOT DETECTED
Streptococcus species: NOT DETECTED
Streptococcus species: DETECTED — AB

## 2020-05-18 LAB — PROCALCITONIN: Procalcitonin: 7.55 ng/mL

## 2020-05-18 LAB — ECHOCARDIOGRAM LIMITED
Height: 72 in
Weight: 1932.99 oz

## 2020-05-18 LAB — COMPREHENSIVE METABOLIC PANEL
ALT: 1036 U/L — ABNORMAL HIGH (ref 0–44)
ALT: 1150 U/L — ABNORMAL HIGH (ref 0–44)
AST: 1027 U/L — ABNORMAL HIGH (ref 15–41)
AST: 1342 U/L — ABNORMAL HIGH (ref 15–41)
Albumin: 3.1 g/dL — ABNORMAL LOW (ref 3.5–5.0)
Albumin: 3 g/dL — ABNORMAL LOW (ref 3.5–5.0)
Alkaline Phosphatase: 158 U/L — ABNORMAL HIGH (ref 38–126)
Alkaline Phosphatase: 242 U/L — ABNORMAL HIGH (ref 38–126)
Anion gap: 15 (ref 5–15)
Anion gap: 19 — ABNORMAL HIGH (ref 5–15)
BUN: 58 mg/dL — ABNORMAL HIGH (ref 8–23)
BUN: 70 mg/dL — ABNORMAL HIGH (ref 8–23)
CO2: 23 mmol/L (ref 22–32)
CO2: 21 mmol/L — ABNORMAL LOW (ref 22–32)
Calcium: 8.4 mg/dL — ABNORMAL LOW (ref 8.9–10.3)
Calcium: 8.4 mg/dL — ABNORMAL LOW (ref 8.9–10.3)
Chloride: 100 mmol/L (ref 98–111)
Chloride: 105 mmol/L (ref 98–111)
Creatinine, Ser: 2.02 mg/dL — ABNORMAL HIGH (ref 0.61–1.24)
Creatinine, Ser: 2.23 mg/dL — ABNORMAL HIGH (ref 0.61–1.24)
GFR, Estimated: 34 mL/min — ABNORMAL LOW (ref >60)
GFR, Estimated: 30 mL/min — ABNORMAL LOW (ref >60)
Glucose, Bld: 102 mg/dL — ABNORMAL HIGH (ref 70–99)
Glucose, Bld: 101 mg/dL — ABNORMAL HIGH (ref 70–99)
Potassium: 4.3 mmol/L (ref 3.5–5.1)
Potassium: 4 mmol/L (ref 3.5–5.1)
Sodium: 138 mmol/L (ref 135–145)
Sodium: 145 mmol/L (ref 135–145)
Total Bilirubin: 1.9 mg/dL — ABNORMAL HIGH (ref 0.3–1.2)
Total Bilirubin: 2.1 mg/dL — ABNORMAL HIGH (ref 0.3–1.2)
Total Protein: 6.2 g/dL — ABNORMAL LOW (ref 6.5–8.1)
Total Protein: 5.8 g/dL — ABNORMAL LOW (ref 6.5–8.1)

## 2020-05-18 LAB — POCT I-STAT 7, (LYTES, BLD GAS, ICA,H+H)
Acid-base deficit: 2 mmol/L (ref 0.0–2.0)
Acid-base deficit: 5 mmol/L — ABNORMAL HIGH (ref 0.0–2.0)
Bicarbonate: 23 mmol/L (ref 20.0–28.0)
Bicarbonate: 27 mmol/L (ref 20.0–28.0)
Calcium, Ion: 0.95 mmol/L — ABNORMAL LOW (ref 1.15–1.40)
Calcium, Ion: 1.12 mmol/L — ABNORMAL LOW (ref 1.15–1.40)
HCT: 26 % — ABNORMAL LOW (ref 39.0–52.0)
HCT: 37 % — ABNORMAL LOW (ref 39.0–52.0)
Hemoglobin: 12.6 g/dL — ABNORMAL LOW (ref 13.0–17.0)
Hemoglobin: 8.8 g/dL — ABNORMAL LOW (ref 13.0–17.0)
O2 Saturation: 92 %
O2 Saturation: 94 %
Patient temperature: 95.9
Potassium: 3.8 mmol/L (ref 3.5–5.1)
Potassium: 5.7 mmol/L — ABNORMAL HIGH (ref 3.5–5.1)
Sodium: 144 mmol/L (ref 135–145)
Sodium: 148 mmol/L — ABNORMAL HIGH (ref 135–145)
TCO2: 25 mmol/L (ref 22–32)
TCO2: 29 mmol/L (ref 22–32)
pCO2 arterial: 52.5 mmHg — ABNORMAL HIGH (ref 32.0–48.0)
pCO2 arterial: 71.6 mmHg (ref 32.0–48.0)
pH, Arterial: 7.176 — CL (ref 7.350–7.450)
pH, Arterial: 7.249 — ABNORMAL LOW (ref 7.350–7.450)
pO2, Arterial: 78 mmHg — ABNORMAL LOW (ref 83.0–108.0)
pO2, Arterial: 82 mmHg — ABNORMAL LOW (ref 83.0–108.0)

## 2020-05-18 LAB — AMYLASE: Amylase: 331 U/L — ABNORMAL HIGH (ref 28–100)

## 2020-05-18 LAB — BASIC METABOLIC PANEL
Anion gap: 21 — ABNORMAL HIGH (ref 5–15)
BUN: 82 mg/dL — ABNORMAL HIGH (ref 8–23)
CO2: 17 mmol/L — ABNORMAL LOW (ref 22–32)
Calcium: 8.1 mg/dL — ABNORMAL LOW (ref 8.9–10.3)
Chloride: 106 mmol/L (ref 98–111)
Creatinine, Ser: 2.55 mg/dL — ABNORMAL HIGH (ref 0.61–1.24)
GFR, Estimated: 26 mL/min — ABNORMAL LOW (ref >60)
Glucose, Bld: 100 mg/dL — ABNORMAL HIGH (ref 70–99)
Potassium: 4.6 mmol/L (ref 3.5–5.1)
Sodium: 144 mmol/L (ref 135–145)

## 2020-05-18 LAB — I-STAT ARTERIAL BLOOD GAS, ED
Acid-base deficit: 1 mmol/L (ref 0.0–2.0)
Acid-base deficit: 2 mmol/L (ref 0.0–2.0)
Bicarbonate: 27 mmol/L (ref 20.0–28.0)
Bicarbonate: 25.4 mmol/L (ref 20.0–28.0)
Calcium, Ion: 1.14 mmol/L — ABNORMAL LOW (ref 1.15–1.40)
Calcium, Ion: 1.15 mmol/L (ref 1.15–1.40)
HCT: 39 % (ref 39.0–52.0)
HCT: 38 % — ABNORMAL LOW (ref 39.0–52.0)
Hemoglobin: 13.3 g/dL (ref 13.0–17.0)
Hemoglobin: 12.9 g/dL — ABNORMAL LOW (ref 13.0–17.0)
O2 Saturation: 96 %
O2 Saturation: 89 %
Potassium: 3.6 mmol/L (ref 3.5–5.1)
Potassium: 3.4 mmol/L — ABNORMAL LOW (ref 3.5–5.1)
Sodium: 143 mmol/L (ref 135–145)
Sodium: 143 mmol/L (ref 135–145)
TCO2: 29 mmol/L (ref 22–32)
TCO2: 27 mmol/L (ref 22–32)
pCO2 arterial: 59 mmHg — ABNORMAL HIGH (ref 32.0–48.0)
pCO2 arterial: 55.1 mmHg — ABNORMAL HIGH (ref 32.0–48.0)
pH, Arterial: 7.269 — ABNORMAL LOW (ref 7.350–7.450)
pH, Arterial: 7.272 — ABNORMAL LOW (ref 7.350–7.450)
pO2, Arterial: 95 mmHg (ref 83.0–108.0)
pO2, Arterial: 66 mmHg — ABNORMAL LOW (ref 83.0–108.0)

## 2020-05-18 LAB — HEMOGLOBIN A1C
Hgb A1c MFr Bld: 5.7 % — ABNORMAL HIGH (ref 4.8–5.6)
Mean Plasma Glucose: 116.89 mg/dL

## 2020-05-18 LAB — CBG MONITORING, ED: Glucose-Capillary: 98 mg/dL (ref 70–99)

## 2020-05-18 LAB — GLUCOSE, CAPILLARY
Glucose-Capillary: 35 mg/dL — CL (ref 70–99)
Glucose-Capillary: 84 mg/dL (ref 70–99)
Glucose-Capillary: 91 mg/dL (ref 70–99)
Glucose-Capillary: 92 mg/dL (ref 70–99)

## 2020-05-18 LAB — CBC
HCT: 43.2 % (ref 39.0–52.0)
Hemoglobin: 13.6 g/dL (ref 13.0–17.0)
MCH: 28.5 pg (ref 26.0–34.0)
MCHC: 31.5 g/dL (ref 30.0–36.0)
MCV: 90.6 fL (ref 80.0–100.0)
Platelets: 199 10*3/uL (ref 150–400)
RBC: 4.77 MIL/uL (ref 4.22–5.81)
RDW: 13.4 % (ref 11.5–15.5)
WBC: 5.7 10*3/uL (ref 4.0–10.5)
nRBC: 0 % (ref 0.0–0.2)

## 2020-05-18 LAB — LACTIC ACID, PLASMA
Lactic Acid, Venous: 3.8 mmol/L (ref 0.5–1.9)
Lactic Acid, Venous: 2.9 mmol/L (ref 0.5–1.9)
Lactic Acid, Venous: 3 mmol/L (ref 0.5–1.9)

## 2020-05-18 LAB — CORTISOL: Cortisol, Plasma: >100 ug/dL

## 2020-05-18 LAB — MAGNESIUM: Magnesium: 3.5 mg/dL — ABNORMAL HIGH (ref 1.7–2.4)

## 2020-05-18 LAB — MRSA PCR SCREENING: MRSA by PCR: NEGATIVE

## 2020-05-18 LAB — LIPASE, BLOOD: Lipase: 290 U/L — ABNORMAL HIGH (ref 11–51)

## 2020-05-18 LAB — HIV ANTIBODY (ROUTINE TESTING W REFLEX): HIV Screen 4th Generation wRfx: NONREACTIVE

## 2020-05-18 LAB — PHOSPHORUS: Phosphorus: 5.1 mg/dL — ABNORMAL HIGH (ref 2.5–4.6)

## 2020-05-18 MED ORDER — POLYETHYLENE GLYCOL 3350 17 G PO PACK: 17.0000 g | PACK | Freq: Every day | ORAL | Status: DC | PRN

## 2020-05-18 MED ORDER — PHENYLEPHRINE CONCENTRATED 100MG/250ML (0.4 MG/ML) INFUSION SIMPLE
25.0000 ug/min | INTRAVENOUS | Status: DC
  Filled 2020-05-18: qty 250

## 2020-05-18 MED ORDER — SODIUM BICARBONATE 8.4 % IV SOLN
100.0000 meq | Freq: Once | INTRAVENOUS | Status: AC
  Administered 2020-05-18: 100 meq via INTRAVENOUS
  Filled 2020-05-18: qty 100

## 2020-05-18 MED ORDER — ARFORMOTEROL TARTRATE 15 MCG/2ML IN NEBU
15.0000 ug | INHALATION_SOLUTION | Freq: Two times a day (BID) | RESPIRATORY_TRACT | Status: DC
  Administered 2020-05-18: 15 ug via RESPIRATORY_TRACT
  Filled 2020-05-18 (×4): qty 2

## 2020-05-18 MED ORDER — CHLORHEXIDINE GLUCONATE CLOTH 2 % EX PADS
6.0000 | MEDICATED_PAD | Freq: Every day | CUTANEOUS | Status: DC
  Administered 2020-05-18: 6 via TOPICAL

## 2020-05-18 MED ORDER — LORAZEPAM 2 MG/ML IJ SOLN
1.0000 mg | INTRAMUSCULAR | Status: DC | PRN
  Filled 2020-05-18: qty 1

## 2020-05-18 MED ORDER — SODIUM CHLORIDE 0.9 % IV SOLN
2.0000 g | INTRAVENOUS | Status: DC
  Administered 2020-05-18: 2 g via INTRAVENOUS
  Filled 2020-05-18: qty 20

## 2020-05-18 MED ORDER — SODIUM CHLORIDE 0.9 % IV BOLUS
1000.0000 mL | Freq: Once | INTRAVENOUS | Status: AC
  Administered 2020-05-18: 1000 mL via INTRAVENOUS

## 2020-05-18 MED ORDER — PHENYLEPHRINE HCL-NACL 10-0.9 MG/250ML-% IV SOLN
25.0000 ug/min | INTRAVENOUS | Status: DC
  Administered 2020-05-18: 80 ug/min via INTRAVENOUS
  Filled 2020-05-18: qty 500
  Filled 2020-05-18: qty 250

## 2020-05-18 MED ORDER — SODIUM CHLORIDE 0.9 % IV SOLN: 250.0000 mL | INTRAVENOUS | Status: DC

## 2020-05-18 MED ORDER — CHLORHEXIDINE GLUCONATE 0.12 % MT SOLN: 15.0000 mL | Freq: Two times a day (BID) | OROMUCOSAL | Status: DC

## 2020-05-18 MED ORDER — DOCUSATE SODIUM 100 MG PO CAPS: 100.0000 mg | ORAL_CAPSULE | Freq: Two times a day (BID) | ORAL | Status: DC | PRN

## 2020-05-18 MED ORDER — ORAL CARE MOUTH RINSE
15.0000 mL | Freq: Two times a day (BID) | OROMUCOSAL | Status: DC
  Administered 2020-05-18: 15 mL via OROMUCOSAL

## 2020-05-18 MED ORDER — SODIUM CHLORIDE 0.9 % IV SOLN
500.0000 mg | INTRAVENOUS | Status: DC
  Administered 2020-05-18: 500 mg via INTRAVENOUS
  Filled 2020-05-18 (×2): qty 500

## 2020-05-18 MED ORDER — PANTOPRAZOLE SODIUM 40 MG IV SOLR: 40.0000 mg | Freq: Every day | INTRAVENOUS | Status: DC

## 2020-05-18 MED ORDER — DEXTROSE 50 % IV SOLN
INTRAVENOUS | Status: AC
  Administered 2020-05-18 – 2020-05-19 (×2): 25 mL
  Filled 2020-05-18: qty 50

## 2020-05-18 MED ORDER — PHENYLEPHRINE CONCENTRATED 100MG/250ML (0.4 MG/ML) INFUSION SIMPLE
0.0000 ug/min | INTRAVENOUS | Status: DC
  Administered 2020-05-18: 340 ug/min via INTRAVENOUS
  Administered 2020-05-19: 350 ug/min via INTRAVENOUS
  Filled 2020-05-18 (×3): qty 250

## 2020-05-18 NOTE — Progress Notes (Addendum)
PCCM Interval Progress Note  Called to see pt due to increased WOB and hypotension requiring neosynephrine.  Pt on BiPAP, arouses to voice but not fully with it.  Shakes his head and waves arms in response to some questions, but difficult to interpret whether it is meaningful / purposeful or not.  RR in 30's to 40's.  New hypotension.  Presumed PNA based on imaging.  Currently on 124mcg/min neosynephrine.  Chart reviewed.  Severe COPD at baseline with chronic 2 - 2.5L O2 requirements.  FEV1 30% in 2017. Discussed with wife the fact that he is tiring out and he is on the cusp of intubation.  Also explained likelihood of encountering difficulties with weaning from and liberating from the vent and potential need for trach down the road.  Wife understood and discussed extensively with her two children who are planning on being at the hospital in the morning. Family has requested that we defer intubation tonight unless absolutely necessary so that they can meet tomorrow to discuss further as a group.  Explained we can certainly try to follow this plan but it is high risk (in the event of arrest etc).  Family understands.  Received verbal consent for CVL given increasing pressor requirements.  Right femoral CVL placed.  Right radial arterial line attempted by RT and myself, unable to thread catheter.  I then attempted right femoral arterial line and was able to cannulate vessel and thread catheter; however, met resistance in removing the wire despite manipulating.  I removed entire catheter with wire inside and noted that wire had kinked and coiled at the distal tip.   Continue abx and abx (ceftriaxone and azithro - prelim blood cultures growing strep).  Rest of plan per H&P note from earlier.  Additional CC time: 35 min.   Montey Hora, [facility] 05/18/2020, 10:32 PM

## 2020-05-18 NOTE — ED Notes (Signed)
Bladder scanner showed 205 mL

## 2020-05-18 NOTE — Progress Notes (Signed)
PT Cancellation Note  Patient Details Name: John Marsh MRN: [id number] DOB: 08/26/1945   Cancelled Treatment:    Reason Eval/Treat Not Completed: Medical issues which prohibited therapy very medically unstable and transferred to ICU. Holding for now, will f/u when more stable.   Madelaine Etienne, DPT, PN1   Supplemental Physical Therapist [facility]    Pager [phone] Acute Rehab Office [phone]

## 2020-05-18 NOTE — Progress Notes (Addendum)
Initial Nutrition Assessment  DOCUMENTATION CODES:   Severe malnutrition in context of chronic illness, Underweight  INTERVENTION:   Consider Cortrak placement for enteral nutrition: Osmolite 1.2 at 20 ml/h, increase by 10 ml every 8 hours to goal rate of 65 ml/h to provide 1872 kcal, 87 gm protein, 1279 ml free water daily.  When nutrition is started, recommend monitor magnesium, potassium, and phosphorus levels, MD to replete as needed, as pt is at risk for refeeding syndrome given severe malnutrition with minimal intake for the past week.   NUTRITION DIAGNOSIS:   Severe Malnutrition related to chronic illness (COPD) as evidenced by severe muscle depletion, severe fat depletion.  GOAL:   Patient will meet greater than or equal to 90% of their needs  MONITOR:   Diet advancement, PO intake, Labs, Skin  REASON FOR ASSESSMENT:   Consult COPD Protocol  ASSESSMENT:   74 yo male admitted after collapsing at home, with respiratory arrest, pulses intact. PMH includes chronic respiratory failure, COPD, A fib, HTN, multiple lung nodules, iron deficiency anemia, PVD, CHF.   Discussed patient in ICU rounds and with RN today. Patient is currently requiring BiPAP.  Spoke with wife at bedside who reports patient has been eating poorly for the past 6 months and has lost a lot of weight. He has been drinking Ensure supplements with 30 gm protein. Suspect he was drinking Ensure Max protein which has 30 gm protein, but only 160 kcals. Patient has had nothing to eat for the past 3 days per wife.  Currently NPO.  Labs reviewed. BUN 70, Creat 2.23, phos 5.1, magnesium 3.5 CBG: 91 this morning  Medications reviewed and include novolog, prednisone, remeron, flomax.  Patient is severely malnourished. Will likely need Cortrak for enteral nutrition support if supportive care continues.   NUTRITION - FOCUSED PHYSICAL EXAM:    Most Recent Value  Orbital Region Severe depletion  Upper Arm  Region Severe depletion  Thoracic and Lumbar Region Severe depletion  Buccal Region Severe depletion  Temple Region Severe depletion  Clavicle Bone Region Severe depletion  Clavicle and Acromion Bone Region Severe depletion  Scapular Bone Region Severe depletion  Dorsal Hand Severe depletion  Patellar Region Severe depletion  Anterior Thigh Region Severe depletion  Posterior Calf Region Severe depletion  Edema (RD Assessment) None  Hair Reviewed  Eyes Unable to assess  Mouth Unable to assess  Skin Reviewed  Nails Reviewed       Diet Order:   Diet Order            Diet NPO time specified  Diet effective now                 EDUCATION NEEDS:   Not appropriate for education at this time  Skin:  Skin Assessment: Skin Integrity Issues: Skin Integrity Issues:: Stage I Stage I: coccyx, sacrum  Last BM:  no BM documented  Height:   Ht Readings from Last 1 Encounters:  06/04/2020 6' (1.829 m)    Weight:   Wt Readings from Last 1 Encounters:  06/04/20 54.8 kg    Ideal Body Weight:  80.9 kg  BMI:  Body mass index is 16.39 kg/m.  Estimated Nutritional Needs:   Kcal:  1800-2000  Protein:  85-100 gm  Fluid:  1.8 L    Gabriel Rainwater, RD, LDN, CNSC Please refer to Amion for contact information.

## 2020-05-18 NOTE — ED Notes (Signed)
Pt found to be attempting to get out of bed and removing lines/drains. Nurse to page MD. Staffing called for sitter.

## 2020-05-18 NOTE — ED Notes (Addendum)
This RN while rounding found pt on floor with bi pap. This nurse called for assistance. RN [place], RN [place], [place] Sharlyne Pacas helped to put pt in bed. This nurse did not find any bruising, lacerations from fall. MD Danford made aware. MD Danford to come see patient.

## 2020-05-18 NOTE — Progress Notes (Signed)
RT note. RT attempt x3 on Rt radial for aline, Unsuccessful. PA attempt x1 unsuccessful. RT was told to hold off on ABG at this time.  RT continue to monitor.

## 2020-05-18 NOTE — Progress Notes (Signed)
  Echocardiogram 2D Echocardiogram has been performed.  John Marsh John Marsh 05/18/2020, 12:56 PM

## 2020-05-18 NOTE — Procedures (Signed)
Central Venous Catheter Insertion Procedure Note  HANLEY WOERNER  [id number]  July 09, 1945  Date:05/18/20  Time:10:22 PM   Provider Performing:Briggitte Boline Celine Mans   Procedure: Insertion of Non-tunneled Central Venous Catheter(36556) without US guidance  Indication(s) Medication administration and Difficult access  Consent Risks of the procedure as well as the alternatives and risks of each were explained to the patient and/or caregiver.  Consent for the procedure was obtained and is signed in the bedside chart  Anesthesia Topical only with 1% lidocaine   Timeout Verified patient identification, verified procedure, site/side was marked, verified correct patient position, special equipment/implants available, medications/allergies/relevant history reviewed, required imaging and test results available.  Sterile Technique Maximal sterile technique including full sterile barrier drape, hand hygiene, sterile gown, sterile gloves, mask, hair covering, sterile ultrasound probe cover (if used).  Procedure Description Area of catheter insertion was cleaned with chlorhexidine and draped in sterile fashion.  Without real-time ultrasound guidance a central venous catheter was placed into the right femoral vein. Nonpulsatile blood flow and easy flushing noted in all ports.  The catheter was sutured in place and sterile dressing applied.  Complications/Tolerance None; patient tolerated the procedure well. Chest X-ray is ordered to verify placement for internal jugular or subclavian cannulation.   Chest x-ray is not ordered for femoral cannulation.  EBL Minimal  Specimen(s) None   Rutherford Guys, [place] [facility] 05/18/2020, 10:23 PM

## 2020-05-18 NOTE — Progress Notes (Signed)
eLink Physician-Brief Progress Note Patient Name: John Marsh DOB: 1945/08/18 MRN: [id number]   Date of Service  05/18/2020  HPI/Events of Note  Hypotension - BP = 56/37 with MAP = 44. Last LVEF = 60-65%. Hgb = 12.6. No CVL or CVL. CXR with emphysema, fibrosis and ?pneumonia. Last pH = 7.249. High risk for intubation.  eICU Interventions  Plan: 1. Bolus with 0.9 NaCl 1 liter IV over 1 hour now. 2. Phenylephrine IV infusion via PIV. Titrate to MAP >= 65. 3. ABG STAT.  4. Will ask ground team to evaluate the patient at bedside.       Intervention Category Major Interventions: Hypotension - evaluation and management  Kechia Yahnke Eugene 05/18/2020, 8:40 PM

## 2020-05-18 NOTE — Progress Notes (Signed)
eLink Physician-Brief Progress Note Patient Name: John Marsh DOB: 1945/11/01 MRN: [id number]   Date of Service  05/18/2020  HPI/Events of Note  Hypotension - BP = 82/37 with MAP = 48 on Phenylephrine iV infusion at 200 mcg/min.  eICU Interventions  Plan: 1. NaHCO3 100 meq IV now. 2. Monitor CVP now and Q 4 hours.  3. Increase ceiling on Phenylephrine IV infusion to 400 mcg/min     Intervention Category Major Interventions: Hypotension - evaluation and management  Corbet Hanley Eugene 05/18/2020, 11:17 PM

## 2020-05-18 NOTE — Consult Note (Signed)
NAME:  John Marsh, MRN:  [id number], DOB:  05-11-1946, LOS: 1 ADMISSION DATE:  04/21/2020, CONSULTATION DATE: 05/18/2020 REFERRING MD: [facility], CHIEF COMPLAINT: Respiratory failure  Brief History   74 year old year end-stage COPD with increasing shortness of breath and hypercarbia.  History of present illness   74 year old male who was brought to the emergency room department 05/09/2020 due to worsening shortness of breath.  [facility]  He is O2 dependent and continues to smoke despite his plethora of health issues that are well documented below.  Following admission to the emergency department he became refractory to current interventions pulmonary critical care was called to the bedside for possible intubation.  His arterial blood gases are slightly improved.  He is only on 30% FiO2.  pH is better and PCO2 is down therefore we will transfer to intensive care unit and monitor him closely.  We would like to avoid intubation as he will most likely not come off the ventilator with a prolonged course.  Attempted to call his wife with the answering machine receiving call.  Past Medical History   Past Medical History:  Diagnosis Date  . Acute on chronic respiratory failure with hypoxia (HCC) 09/19/2016  . Anemia   . Atrial fibrillation (HCC)    Chadsvasc2=3  . Chest pain 06/30/2016  . Chronic diastolic HF (heart failure) (HCC)   . Chronic respiratory failure with hypoxia (HCC) 07/03/2015  . COPD (chronic obstructive pulmonary disease) (HCC)   . COPD exacerbation (HCC) 05/07/2017  . COPD with acute exacerbation (HCC) 07/03/2015  . Coronary artery disease due to lipid rich plaque   . Coronary heart disease   . Dyspnea 07/15/2017  . Elevated troponin 09/19/2016  . Emphysema lung (HCC) 03/04/2015  . Emphysema of lung (HCC)   . Hyperlipidemia   . Hypertension   . Iron deficiency anemia   . MI (myocardial infarction) (HCC)   . Multiple  lung nodules on CT   . Nocturnal hypoxia   . Peripheral arterial disease (HCC)   . PVD (peripheral vascular disease) with claudication (HCC) 07/27/2015  . Respiratory failure (HCC) 05/02/2017  . Shortness of breath 06/03/2015  . Shortness of breath dyspnea   . Tachycardia 07/03/2015  . Typical atrial flutter (HCC)      Significant Hospital Events   05/18/2020 refractory to current interventions require intensive care unit admission  Consults:  05/18/2020 pulmonary critical care  Procedures:    Significant Diagnostic Tests:    Micro Data:  04/26/2020 blood cultures x2  Antimicrobials:  04/25/2020 cefepime  Interim history/subjective:  74 year old male with near end-stage COPD on her mental status.  Objective   Blood pressure (!) 142/86, pulse (!) 109, resp. rate (!) 29, height 6' (1.829 m), weight 54.8 kg, SpO2 98 %.    Vent Mode: BIPAP FiO2 (%):  [30 %-35 %] 30 % Set Rate:  [14 bmp] 14 bmp PEEP:  [8 cmH20] 8 cmH20 Pressure Support:  [12 cmH20] 12 cmH20  No intake or output data in the 24 hours ending 05/18/20 0841 Filed Weights   04/28/2020 1706  Weight: 54.8 kg    Examination: General: Elderly male who has a disheveled appearance HENT: Full facemask of BiPAP in place.  No JVD appreciated Lungs: Poor air movement Cardiovascular: Heart sounds regular sinus tachycardia 122 Abdomen: Soft nontender positive bowel sounds Extremities: Areas of falling noted on both knees with abrasions and ecchymosis.  No edema appreciated Neuro: Poorly responsive  but does follow some simple commands. GU: Has not voided bladder scan reveals 204 cc  Resolved Hospital Problem list     Assessment & Plan:  Acute on chronic hypercarbic respiratory failure in the setting of a plethora of health issues ongoing tobacco abuse. Admit to intensive care Leave on BiPAP for now as ABGs have improved If intubated doubt he will come off the ventilator easily Steroids Bronchodilators Empirical  antimicrobial therapy Serial ABGs May need intubation before the day is over.  Altered mental status most likely multifactorial with hypercarbia, Ativan injection, Haldol injection. Monitor in intensive care unit Stop stop benzo diazepam  Chronic atrial fibrillation Continue anticoagulant May need to transistion to heparin drip  Chronic iron deficiency anemia Recent Labs    05/04/2020 2314 05/18/20 0744  HGB 15.3 13.3  Monitor hemoglobin  Acute renal insufficiency Lab Results  Component Value Date   CREATININE 2.02 (H) 05/18/2020   CREATININE 1.50 (H) 05/03/2020   CREATININE 1.57 (H) 05/11/2020  Avoid nephrotoxins Serial creatinine Monitor electrolytes  History of coronary artery disease ICU admission and telemetry Monitor troponins   History of peripheral vascular disease Maintain anticoagulation Monitor in ICU    Failure to thrive with multiple health comorbidities. Discussion with family concerning how aggressive care should be.  Best practice (evaluated daily)   Diet: NPO Pain/Anxiety/Delirium protocol (if indicated): As needed Haldol VAP protocol (if indicated): In place DVT prophylaxis: On Eliquis GI prophylaxis: PPI Glucose control: Sliding scale insulin Mobility: Bedrest last date of multidisciplinary goals of care discussion 05/18/2020 unable to reach wife.  Patient is obtunded. Family and staff present.not able to do family not present Summary of discussion family not available Follow up goals of care discussion due needs end-of-life discussions Code Status: Full Disposition: Intensive care unit  Labs   CBC: Recent Labs  Lab 04/20/2020 1633 04/22/2020 1659 05/01/2020 1742 05/14/2020 2314 05/18/20 0744  WBC 8.0  --   --   --   --   NEUTROABS 7.2  --   --   --   --   HGB 14.0 15.0 14.6 15.3 13.3  HCT 46.6 44.0 43.0 45.0 39.0  MCV 93.2  --   --   --   --   PLT 317  --   --   --   --     Basic Metabolic Panel: Recent Labs  Lab 04/28/2020 1633  04/20/2020 1633 04/27/2020 1659 05/14/2020 1742 04/27/2020 2314 05/18/20 0259 05/18/20 0744  NA 142   < > 138 139 140 138 143  K 4.1   < > 4.1 3.9 2.9* 4.3 3.6  CL 98  --  102  --   --  100  --   CO2 24  --   --   --   --  23  --   GLUCOSE 183*  --  177*  --   --  102*  --   BUN 42*  --  47*  --   --  58*  --   CREATININE 1.57*  --  1.50*  --   --  2.02*  --   CALCIUM 9.6  --   --   --   --  8.4*  --    < > = values in this interval not displayed.   GFR: Estimated Creatinine Clearance: 24.5 mL/min (A) (by C-G formula based on SCr of 2.02 mg/dL (H)). Recent Labs  Lab 04/20/2020 1633 05/07/2020 1643 05/18/20 0336  WBC 8.0  --   --  LATICACIDVEN  --  7.1* 3.8*    Liver Function Tests: Recent Labs  Lab 04/23/2020 1633 05/18/20 0259  AST 84* 1,027*  ALT 84* 1,036*  ALKPHOS 111 158*  BILITOT 0.8 1.9*  PROT 7.0 6.2*  ALBUMIN 3.4* 3.1*   No results for input(s): LIPASE, AMYLASE in the last 168 hours. No results for input(s): AMMONIA in the last 168 hours.  ABG    Component Value Date/Time   PHART 7.269 (L) 05/18/2020 0744   PCO2ART 59.0 (H) 05/18/2020 0744   PO2ART 95 05/18/2020 0744   HCO3 27.0 05/18/2020 0744   TCO2 29 05/18/2020 0744   ACIDBASEDEF 1.0 05/18/2020 0744   O2SAT 96.0 05/18/2020 0744     Coagulation Profile: Recent Labs  Lab 04/29/2020 1633  INR 1.8*    Cardiac Enzymes: No results for input(s): CKTOTAL, CKMB, CKMBINDEX, TROPONINI in the last 168 hours.  HbA1C: Hgb A1c MFr Bld  Date/Time Value Ref Range Status  07/15/2017 05:05 AM 8.2 (H) 4.8 - 5.6 % Final    Comment:    (NOTE) Pre diabetes:          5.7%-6.4% Diabetes:              >6.4% Glycemic control for   <7.0% adults with diabetes   05/08/2017 04:00 AM 7.9 (H) 4.8 - 5.6 % Final    Comment:    (NOTE) Pre diabetes:          5.7%-6.4% Diabetes:              >6.4% Glycemic control for   <7.0% adults with diabetes     CBG: Recent Labs  Lab 05/18/20 0813  GLUCAP 98    Review of  Systems:   na  Past Medical History  He,  has a past medical history of Acute on chronic respiratory failure with hypoxia (HCC) (09/19/2016), Anemia, Atrial fibrillation (HCC), Chest pain (06/30/2016), Chronic diastolic HF (heart failure) (HCC), Chronic respiratory failure with hypoxia (HCC) (07/03/2015), COPD (chronic obstructive pulmonary disease) (HCC), COPD exacerbation (HCC) (05/07/2017), COPD with acute exacerbation (HCC) (07/03/2015), Coronary artery disease due to lipid rich plaque, Coronary heart disease, Dyspnea (07/15/2017), Elevated troponin (09/19/2016), Emphysema lung (HCC) (03/04/2015), Emphysema of lung (HCC), Hyperlipidemia, Hypertension, Iron deficiency anemia, MI (myocardial infarction) (HCC), Multiple lung nodules on CT, Nocturnal hypoxia, Peripheral arterial disease (HCC), PVD (peripheral vascular disease) with claudication (HCC) (07/27/2015), Respiratory failure (HCC) (05/02/2017), Shortness of breath (06/03/2015), Shortness of breath dyspnea, Tachycardia (07/03/2015), and Typical atrial flutter (HCC).   Surgical History    Past Surgical History:  Procedure Laterality Date  . CHOLECYSTECTOMY    . COLONOSCOPY WITH PROPOFOL N/A 07/19/2017   Procedure: COLONOSCOPY WITH PROPOFOL;  Surgeon: Rachael Fee, MD;  Location: [facility];  Service: Endoscopy;  Laterality: N/A;  . ESOPHAGOGASTRODUODENOSCOPY (EGD) WITH PROPOFOL N/A 07/19/2017   Procedure: ESOPHAGOGASTRODUODENOSCOPY (EGD) WITH PROPOFOL;  Surgeon: Rachael Fee, MD;  Location: [facility];  Service: Endoscopy;  Laterality: N/A;  . NASAL SEPTUM SURGERY    . PERIPHERAL VASCULAR CATHETERIZATION N/A 07/27/2015   Procedure: Abdominal Aortogram;  Surgeon: Chuck Hint, MD;  Location: [facility] INVASIVE CV LAB;  Service: Cardiovascular;  Laterality: N/A;  . PERIPHERAL VASCULAR CATHETERIZATION Bilateral 07/27/2015   Procedure: Lower Extremity Angiography;  Surgeon: Chuck Hint, MD;  Location: [facility] INVASIVE CV LAB;  Service:  Cardiovascular;  Laterality: Bilateral;  . stent placed       Social History   reports that he has been smoking cigarettes. He started smoking  about 60 years ago. He has a 13.50 pack-year smoking history. He has never used smokeless tobacco. He reports that he does not drink alcohol and does not use drugs.   Family History   His family history includes Heart disease in his father, mother, paternal grandfather, and sister.   Allergies No Known Allergies   Home Medications  Prior to Admission medications   Medication Sig Start Date End Date Taking? Authorizing Provider  albuterol (PROVENTIL HFA;VENTOLIN HFA) 108 (90 Base) MCG/ACT inhaler Inhale 2 puffs into the lungs every 6 (six) hours as needed for wheezing or shortness of breath. 02/05/17  Yes Nestor, Donna Christen, MD  BREO ELLIPTA 100-25 MCG/INH AEPB Inhale 1 puff into the lungs daily. 05/06/20  Yes [provider]  budesonide (PULMICORT) 0.5 MG/2ML nebulizer solution Take 2 mLs (0.5 mg total) by nebulization 2 (two) times daily. 03/07/17  Yes Roslynn Amble, MD  diltiazem (CARDIZEM CD) 360 MG 24 hr capsule Take 1 capsule (360 mg total) daily by mouth. 05/07/17  Yes Rolly Salter, MD  ELIQUIS 5 MG TABS tablet TAKE 1 TABLET BY MOUTH TWICE A DAY 07/08/19  Yes Lyn Records, MD  feeding supplement, ENSURE ENLIVE, (ENSURE ENLIVE) LIQD Take 237 mLs by mouth 3 (three) times daily between meals. Patient taking differently: Take 237 mLs by mouth 3 (three) times daily as needed (nutrition).  09/26/16  Yes Narda Bonds, MD  fluticasone (FLONASE) 50 MCG/ACT nasal spray Place 2 sprays into both nostrils daily as needed for allergies or rhinitis. 09/26/16  Yes Narda Bonds, MD  gabapentin (NEURONTIN) 100 MG capsule Take 1 capsule (100 mg total) 2 (two) times daily by mouth. 05/06/17  Yes Rolly Salter, MD  isosorbide mononitrate (IMDUR) 30 MG 24 hr tablet TAKE 1 TABLET DAILY. MAKE OVERDUE APPT WITH DR. Katrinka Blazing BEFORE ANYMORE REFILLS. 3RD  AND FINAL ATTEMPT Patient taking differently: Take 30 mg by mouth daily.  03/23/20  Yes Lyn Records, MD  mirtazapine (REMERON) 30 MG tablet Take 30 mg by mouth at bedtime. 11/24/19  Yes [provider]  nitroGLYCERIN (NITROSTAT) 0.4 MG SL tablet Place 0.4 mg under the tongue every 5 (five) minutes as needed for chest pain. x3 doses as needed for chest pain   Yes [provider]  pantoprazole (PROTONIX) 40 MG tablet Take 1 tablet (40 mg total) by mouth daily. Patient taking differently: Take 40 mg by mouth daily as needed (heartburn).  07/01/16  Yes Erick Blinks, MD  rosuvastatin (CRESTOR) 20 MG tablet Take 20 mg by mouth at bedtime.   Yes [provider]  sodium chloride (OCEAN) 0.65 % SOLN nasal spray Place 1 spray as needed into both nostrils for congestion (nose irritation). 05/06/17  Yes Rolly Salter, MD  SPIRIVA RESPIMAT 2.5 MCG/ACT AERS INHALE 2 PUFFS INTO THE LUNGS EVERY DAY Patient taking differently: Take 2 puffs by mouth daily.  11/27/18  Yes Lupita Leash, MD  tamsulosin (FLOMAX) 0.4 MG CAPS capsule Take 0.4 mg by mouth daily.  06/04/18  Yes [provider]  tiZANidine (ZANAFLEX) 4 MG tablet Take 4 mg by mouth every 8 (eight) hours as needed for muscle spasms.  08/12/18  Yes [provider]  formoterol (PERFOROMIST) 20 MCG/2ML nebulizer solution Take 2 mLs (20 mcg total) by nebulization 2 (two) times daily. DX code: J43.8 Patient not taking: Reported on 05/16/2020 08/31/17   Lupita Leash, MD  mirtazapine (REMERON) 15 MG tablet Take 15 mg by mouth at  bedtime. 04/03/18 04/03/19  [provider]  Spacer/Aero-Holding Chambers (AEROCHAMBER Z-STAT PLUS) inhaler Use as instructed 03/04/15   Roslynn AmbleNestor, Jennings E, MD     Critical care time: 45 min    Brett CanalesSteve Casy Brunetto ACNP Acute Care Nurse Practitioner [facility] Please consult Amion 05/18/2020, 8:41 AM

## 2020-05-18 NOTE — Progress Notes (Signed)
[facility] PROGRESS NOTE    Harvie HeckHugh T Cottingham  WUJ:[id number] DOB: [birth year] DOA: 2020-05-02 PCP: Eartha InchBadger, Michael C, MD      Brief Narrative:  [name] is a 74 y.o. M with COPD on home O2, AF on Eliquis, CAD and HTN who presented with progressive SOB and finally respiratory arrest.  Bagged by EMS, brought in on BiPAP.  CXR with some patchy opacities.  COVID negative.  Spo2 77% here.      Assessment & Plan:  Respiratory failure Due to COPD exacerbation.  PH 7.2, pCO2 70 on admission.  This AM, pCO2 7.2 and PCO2 still >60 after 12 hours on BiPAP, still sleepy/dazed. -Continue maximal therapy with steroids, antibiotics, frequent bronchodilators, and BiPAP -Still a full code, will consult PCCM for evaluation  Atrial fibrillation -Continue apixaban  Diabetes -Continue SS corrections -Continue gabapentin  Coronary disease Hypertension -Continue Imdur, Crestor  AKI  Baseline Cr 0.9, here up to 1.5 -Continue IV fluids         Disposition: Status is: Inpatient  Remains inpatient appropriate because:Altered mental status and Inpatient level of care appropriate due to severity of illness   Dispo: The patient is from: Home              Anticipated d/c is to: TBD              Anticipated d/c date is: > 3 days              Patient currently is not medically stable to d/c.              MDM: The below labs and imaging reports were reviewed and summarized above.  Medication management as above.   DVT prophylaxis:  apixaban (ELIQUIS) tablet 5 mg  Code Status: FULL Family Communication:           Subjective: Patient somewhat dazed.  Trying to pull at lines, tried to get out of bed.  No vomiting, fever.  Still on BiPAP and very out of breath.  Objective: Vitals:   05/18/20 0630 05/18/20 0645 05/18/20 0728 05/18/20 0730  BP: (!) 173/83 (!) 160/145  (!) 145/80  Pulse: 96 97  99  Resp: 19 (!) 28  (!) 28  SpO2: 99% 98% 98% 98%   Weight:      Height:       No intake or output data in the 24 hours ending 05/18/20 0732 Filed Weights   01/03/2020 1706  Weight: 54.8 kg    Examination: General appearance: thin adult male, awake but seems dazed, on BiPAP, very short of breath    HEENT: Anicteric, conjunctiva pink, lids and lashes normal. No nasal deformity, discharge, epistaxis.  Lips cracked, mostly edentulous, OP dry, can't see OP due to BiPAP, hearing normal.   Skin: Warm and dry.  no jaundice.  No suspicious rashes or lesions.senile purpura noted. Cardiac: Tachycardic, regualr, nl S1-S2, no murmurs appreciated.  Capillary refill is brisk.  JVP not visible.  No LE edema.  Radial pulses 2+ and symmetric. Respiratory: Tachypneic on BiPAP, poor air movement, wheezing bilaterally. Abdomen: Abdomen soft.  no TTP. No ascites, distension, hepatosplenomegaly.   MSK: No deformities or effusions. Neuro: Awake and makes eye contact but dazed.  EOMI, moves all extremities with generalied weakness, too confused to answer coherently  Psych: Attention diminished, affect blunted   Data Reviewed: I have personally reviewed following labs and imaging studies:  CBC: Recent Labs  Lab 01/03/2020 1633 01/03/2020 1659 01/03/2020  1742 05/10/2020 2314  WBC 8.0  --   --   --   NEUTROABS 7.2  --   --   --   HGB 14.0 15.0 14.6 15.3  HCT 46.6 44.0 43.0 45.0  MCV 93.2  --   --   --   PLT 317  --   --   --    Basic Metabolic Panel: Recent Labs  Lab 05/13/2020 1633 05/03/2020 1659 05/07/2020 1742 05/04/2020 2314 05/18/20 0259  NA 142 138 139 140 138  K 4.1 4.1 3.9 2.9* 4.3  CL 98 102  --   --  100  CO2 24  --   --   --  23  GLUCOSE 183* 177*  --   --  102*  BUN 42* 47*  --   --  58*  CREATININE 1.57* 1.50*  --   --  2.02*  CALCIUM 9.6  --   --   --  8.4*   GFR: Estimated Creatinine Clearance: 24.5 mL/min (A) (by C-G formula based on SCr of 2.02 mg/dL (H)). Liver Function Tests: Recent Labs  Lab 04/23/2020 1633 05/18/20 0259  AST 84*  1,027*  ALT 84* 1,036*  ALKPHOS 111 158*  BILITOT 0.8 1.9*  PROT 7.0 6.2*  ALBUMIN 3.4* 3.1*   No results for input(s): LIPASE, AMYLASE in the last 168 hours. No results for input(s): AMMONIA in the last 168 hours. Coagulation Profile: Recent Labs  Lab 04/19/2020 1633  INR 1.8*   Cardiac Enzymes: No results for input(s): CKTOTAL, CKMB, CKMBINDEX, TROPONINI in the last 168 hours. BNP (last 3 results) No results for input(s): PROBNP in the last 8760 hours. HbA1C: No results for input(s): HGBA1C in the last 72 hours. CBG: No results for input(s): GLUCAP in the last 168 hours. Lipid Profile: No results for input(s): CHOL, HDL, LDLCALC, TRIG, CHOLHDL, LDLDIRECT in the last 72 hours. Thyroid Function Tests: No results for input(s): TSH, T4TOTAL, FREET4, T3FREE, THYROIDAB in the last 72 hours. Anemia Panel: No results for input(s): VITAMINB12, FOLATE, FERRITIN, TIBC, IRON, RETICCTPCT in the last 72 hours. Urine analysis:    Component Value Date/Time   COLORURINE YELLOW 05/02/2017 1427   APPEARANCEUR CLEAR 05/02/2017 1427   LABSPEC 1.010 05/02/2017 1427   PHURINE 6.5 05/02/2017 1427   GLUCOSEU 100 (A) 05/02/2017 1427   HGBUR MODERATE (A) 05/02/2017 1427   BILIRUBINUR NEGATIVE 05/02/2017 1427   KETONESUR NEGATIVE 05/02/2017 1427   PROTEINUR NEGATIVE 05/02/2017 1427   UROBILINOGEN 0.2 08/08/2012 1910   NITRITE NEGATIVE 05/02/2017 1427   LEUKOCYTESUR NEGATIVE 05/02/2017 1427   Sepsis Labs: @LABRCNTIP (procalcitonin:4,lacticacidven:4)  ) Recent Results (from the past 240 hour(s))  Blood Culture (routine x 2)     Status: None (Preliminary result)   Collection Time: 05/12/2020  4:43 PM   Specimen: BLOOD  Result Value Ref Range Status   Specimen Description BLOOD SITE NOT SPECIFIED  Final   Special Requests   Final    BOTTLES DRAWN AEROBIC AND ANAEROBIC Blood Culture adequate volume   Culture  Setup Time   Final    ANAEROBIC BOTTLE ONLY GRAM VARIABLE ROD Organism ID to  follow Performed at [facility], [address], [place], [place]    Culture PENDING  Incomplete   Report Status PENDING  Incomplete  Resp Panel by RT-PCR (Flu A&B, Covid) Nasopharyngeal Swab     Status: None   Collection Time: 05/07/2020  4:49 PM   Specimen: Nasopharyngeal Swab; Nasopharyngeal(NP) swabs in vial transport  medium  Result Value Ref Range Status   SARS Coronavirus 2 by RT PCR NEGATIVE NEGATIVE Final    Comment: (NOTE) SARS-CoV-2 target nucleic acids are NOT DETECTED.  The SARS-CoV-2 RNA is generally detectable in upper respiratory specimens during the acute phase of infection. The lowest concentration of SARS-CoV-2 viral copies this assay can detect is 138 copies/mL. A negative result does not preclude SARS-Cov-2 infection and should not be used as the sole basis for treatment or other patient management decisions. A negative result may occur with  improper specimen collection/handling, submission of specimen other than nasopharyngeal swab, presence of viral mutation(s) within the areas targeted by this assay, and inadequate number of viral copies(<138 copies/mL). A negative result must be combined with clinical observations, patient history, and epidemiological information. The expected result is Negative.  Fact Sheet for Patients:  BloggerCourse.com  Fact Sheet for Healthcare Providers:  SeriousBroker.it  This test is no t yet approved or cleared by the [place] FDA and  has been authorized for detection and/or diagnosis of SARS-CoV-2 by FDA under an Emergency Use Authorization (EUA). This EUA will remain  in effect (meaning this test can be used) for the duration of the COVID-19 declaration under Section 564(b)(1) of the Act, 21 U.S.C.section 360bbb-3(b)(1), unless the authorization is terminated  or revoked sooner.       Influenza A by PCR NEGATIVE NEGATIVE Final   Influenza B by PCR  NEGATIVE NEGATIVE Final    Comment: (NOTE) The Xpert Xpress SARS-CoV-2/FLU/RSV plus assay is intended as an aid in the diagnosis of influenza from Nasopharyngeal swab specimens and should not be used as a sole basis for treatment. Nasal washings and aspirates are unacceptable for Xpert Xpress SARS-CoV-2/FLU/RSV testing.  Fact Sheet for Patients: BloggerCourse.com  Fact Sheet for Healthcare Providers: SeriousBroker.it  This test is not yet approved or cleared by the [place] FDA and has been authorized for detection and/or diagnosis of SARS-CoV-2 by FDA under an Emergency Use Authorization (EUA). This EUA will remain in effect (meaning this test can be used) for the duration of the COVID-19 declaration under Section 564(b)(1) of the Act, 21 U.S.C. section 360bbb-3(b)(1), unless the authorization is terminated or revoked.  Performed at [facility], [address], [place], [place] [address]   Blood Culture (routine x 2)     Status: None (Preliminary result)   Collection Time: May 21, 2020  5:43 PM   Specimen: BLOOD  Result Value Ref Range Status   Specimen Description BLOOD SITE NOT SPECIFIED  Final   Special Requests   Final    BOTTLES DRAWN AEROBIC AND ANAEROBIC Blood Culture results may not be optimal due to an inadequate volume of blood received in culture bottles Performed at [facility], [address] [place], [place] [address]    Culture PENDING  Incomplete   Report Status PENDING  Incomplete         Radiology Studies: DG Chest Port 1 View  Result Date: 2020/05/21 CLINICAL DATA:  Cough shortness of breath EXAM: PORTABLE CHEST 1 VIEW COMPARISON:  Chest radiograph October 03, 2017 and chest CT July 24, 2017. FINDINGS: The heart size and mediastinal contours are within normal limits. Atherosclerotic calcifications aorta. Extensive emphysematous change with scattered areas of fibrosis. New hazy left  lower lobe predominant interstitial and airspace opacities. The visualized skeletal structures are unremarkable. IMPRESSION: Emphysematous change with scattered areas of fibrosis now with superimposed hazy left lower lobe predominant interstitial and airspace opacities concerning for infection.  Electronically Signed   By: Maudry Mayhew MD   On: 2020-05-20 17:43        Scheduled Meds: . apixaban  5 mg Oral BID  . budesonide  0.5 mg Nebulization BID  . diltiazem  360 mg Oral Daily  . fluticasone furoate-vilanterol  1 puff Inhalation Daily  . gabapentin  100 mg Oral BID  . insulin aspart  0-9 Units Subcutaneous TID WC  . ipratropium-albuterol  3 mL Nebulization TID  . isosorbide mononitrate  30 mg Oral Daily  . methylPREDNISolone (SOLU-MEDROL) injection  60 mg Intravenous Q6H   Followed by  . [START ON 06/15/2020] predniSONE  40 mg Oral Q breakfast  . mirtazapine  30 mg Oral QHS  . nicotine  21 mg Transdermal Daily  . rosuvastatin  20 mg Oral QHS  . tamsulosin  0.4 mg Oral Daily  . Tiotropium Bromide Monohydrate  2 puff Oral Daily   Continuous Infusions: . sodium chloride 125 mL/hr at 05/18/20 0432  . ceFEPime (MAXIPIME) IV Stopped (05-20-20 2108)  . sodium chloride       LOS: 1 day    Time spent: 35 minutes    Alberteen Sam, MD [facility] 05/18/2020, 7:32 AM     Please page though AMION or Epic secure chat:  For [employer], contact charge nurse     Estimated body mass index is 16.39 kg/m as calculated from the following:   Height as of this encounter: 6' (1.829 m).   Weight as of this encounter: 54.8 kg. Malnutrition Type:   Malnutrition Characteristics:   Nutrition Interventions:      . apixaban  5 mg Oral BID  . budesonide  0.5 mg Nebulization BID  . diltiazem  360 mg Oral Daily  . fluticasone furoate-vilanterol  1 puff Inhalation Daily  . gabapentin  100 mg Oral BID  . insulin aspart  0-9 Units Subcutaneous TID WC  .  ipratropium-albuterol  3 mL Nebulization TID  . isosorbide mononitrate  30 mg Oral Daily  . methylPREDNISolone (SOLU-MEDROL) injection  60 mg Intravenous Q6H   Followed by  . [START ON 06/08/2020] predniSONE  40 mg Oral Q breakfast  . mirtazapine  30 mg Oral QHS  . nicotine  21 mg Transdermal Daily  . rosuvastatin  20 mg Oral QHS  . tamsulosin  0.4 mg Oral Daily  . Tiotropium Bromide Monohydrate  2 puff Oral Daily   . sodium chloride 125 mL/hr at 05/18/20 0432  . ceFEPime (MAXIPIME) IV Stopped (20-May-2020 2108)  . sodium chloride      albuterol, albuterol, feeding supplement, fluticasone, haloperidol lactate, LORazepam, nitroGLYCERIN, pantoprazole, sodium chloride, tiZANidine   Current Meds  Medication Sig  . albuterol (PROVENTIL HFA;VENTOLIN HFA) 108 (90 Base) MCG/ACT inhaler Inhale 2 puffs into the lungs every 6 (six) hours as needed for wheezing or shortness of breath.  Marland Kitchen BREO ELLIPTA 100-25 MCG/INH AEPB Inhale 1 puff into the lungs daily.  . budesonide (PULMICORT) 0.5 MG/2ML nebulizer solution Take 2 mLs (0.5 mg total) by nebulization 2 (two) times daily.  Marland Kitchen diltiazem (CARDIZEM CD) 360 MG 24 hr capsule Take 1 capsule (360 mg total) daily by mouth.  Everlene Balls 5 MG TABS tablet TAKE 1 TABLET BY MOUTH TWICE A DAY  . feeding supplement, ENSURE ENLIVE, (ENSURE ENLIVE) LIQD Take 237 mLs by mouth 3 (three) times daily between meals. (Patient taking differently: Take 237 mLs by mouth 3 (three) times daily as needed (nutrition). )  . fluticasone (FLONASE) 50  MCG/ACT nasal spray Place 2 sprays into both nostrils daily as needed for allergies or rhinitis.  Marland Kitchen gabapentin (NEURONTIN) 100 MG capsule Take 1 capsule (100 mg total) 2 (two) times daily by mouth.  . isosorbide mononitrate (IMDUR) 30 MG 24 hr tablet TAKE 1 TABLET DAILY. MAKE OVERDUE APPT WITH DR. Katrinka Blazing BEFORE ANYMORE REFILLS. 3RD AND FINAL ATTEMPT (Patient taking differently: Take 30 mg by mouth daily. )  . mirtazapine (REMERON) 30 MG tablet  Take 30 mg by mouth at bedtime.  . nitroGLYCERIN (NITROSTAT) 0.4 MG SL tablet Place 0.4 mg under the tongue every 5 (five) minutes as needed for chest pain. x3 doses as needed for chest pain  . pantoprazole (PROTONIX) 40 MG tablet Take 1 tablet (40 mg total) by mouth daily. (Patient taking differently: Take 40 mg by mouth daily as needed (heartburn). )  . rosuvastatin (CRESTOR) 20 MG tablet Take 20 mg by mouth at bedtime.  . sodium chloride (OCEAN) 0.65 % SOLN nasal spray Place 1 spray as needed into both nostrils for congestion (nose irritation).  . SPIRIVA RESPIMAT 2.5 MCG/ACT AERS INHALE 2 PUFFS INTO THE LUNGS EVERY DAY (Patient taking differently: Take 2 puffs by mouth daily. )  . tamsulosin (FLOMAX) 0.4 MG CAPS capsule Take 0.4 mg by mouth daily.   Marland Kitchen tiZANidine (ZANAFLEX) 4 MG tablet Take 4 mg by mouth every 8 (eight) hours as needed for muscle spasms.

## 2020-05-18 NOTE — Progress Notes (Signed)
PHARMACY - PHYSICIAN COMMUNICATION CRITICAL VALUE ALERT - BLOOD CULTURE IDENTIFICATION (BCID)  John Marsh is an 74 y.o. male who presented to [facility] on 06-12-2020   Assessment:  1 aerobic bottle w strep species, likely contaminant   Name of physician (or Provider) Contacted: Dr Everardo All  Current antibiotics: ceftriaxone azithro  Changes to prescribed antibiotics recommended:  No changes  Results for orders placed or performed during the hospital encounter of 06/12/20  Blood Culture ID Panel (Reflexed) (Collected: June 12, 2020  4:43 PM)  Result Value Ref Range   Enterococcus faecalis NOT DETECTED NOT DETECTED   Enterococcus Faecium NOT DETECTED NOT DETECTED   Listeria monocytogenes NOT DETECTED NOT DETECTED   Staphylococcus species NOT DETECTED NOT DETECTED   Staphylococcus aureus (BCID) NOT DETECTED NOT DETECTED   Staphylococcus epidermidis NOT DETECTED NOT DETECTED   Staphylococcus lugdunensis NOT DETECTED NOT DETECTED   Streptococcus species DETECTED (A) NOT DETECTED   Streptococcus agalactiae NOT DETECTED NOT DETECTED   Streptococcus pneumoniae NOT DETECTED NOT DETECTED   Streptococcus pyogenes NOT DETECTED NOT DETECTED   A.calcoaceticus-baumannii NOT DETECTED NOT DETECTED   Bacteroides fragilis NOT DETECTED NOT DETECTED   Enterobacterales NOT DETECTED NOT DETECTED   Enterobacter cloacae complex NOT DETECTED NOT DETECTED   Escherichia coli NOT DETECTED NOT DETECTED   Klebsiella aerogenes NOT DETECTED NOT DETECTED   Klebsiella oxytoca NOT DETECTED NOT DETECTED   Klebsiella pneumoniae NOT DETECTED NOT DETECTED   Proteus species NOT DETECTED NOT DETECTED   Salmonella species NOT DETECTED NOT DETECTED   Serratia marcescens NOT DETECTED NOT DETECTED   Haemophilus influenzae NOT DETECTED NOT DETECTED   Neisseria meningitidis NOT DETECTED NOT DETECTED   Pseudomonas aeruginosa NOT DETECTED NOT DETECTED   Stenotrophomonas maltophilia NOT DETECTED NOT DETECTED   Candida  albicans NOT DETECTED NOT DETECTED   Candida auris NOT DETECTED NOT DETECTED   Candida glabrata NOT DETECTED NOT DETECTED   Candida krusei NOT DETECTED NOT DETECTED   Candida parapsilosis NOT DETECTED NOT DETECTED   Candida tropicalis NOT DETECTED NOT DETECTED   Cryptococcus neoformans/gattii NOT DETECTED NOT DETECTED    Elmer Sow, PharmD, BCPS, BCCCP Clinical Pharmacist [phone]  Please check AMION for all [facility] Pharmacy numbers  05/18/2020 8:57 PM

## 2020-05-18 NOTE — Progress Notes (Signed)
PHARMACY - PHYSICIAN COMMUNICATION CRITICAL VALUE ALERT - BLOOD CULTURE IDENTIFICATION (BCID)  John Marsh is an 74 y.o. male who presented to [facility] on May 22, 2020 with a chief complaint of shortness of breath  Assessment: 1/4 Gram variable rod with no organism on BCID  Name of physician (or Provider) Contacted: Joen Laura MD  Current antibiotics: cefepime 2g IV q12h  Changes to prescribed antibiotics recommended: n/a  No results found for this or any previous visit.  De Burrs  PGY1 pharmacy resident 05/18/2020  8:47 AM

## 2020-05-18 NOTE — ED Notes (Signed)
Patient trying to get out of bed, taking off cardiac leads/pulse ox and pulled out IV. Provider aware.

## 2020-05-19 ENCOUNTER — Inpatient Hospital Stay (HOSPITAL_COMMUNITY): Payer: Medicare Other

## 2020-05-19 ENCOUNTER — Other Ambulatory Visit: Payer: Self-pay | Admitting: Interventional Cardiology

## 2020-05-19 LAB — POCT I-STAT 7, (LYTES, BLD GAS, ICA,H+H)
Acid-base deficit: 8 mmol/L — ABNORMAL HIGH (ref 0.0–2.0)
Acid-base deficit: 10 mmol/L — ABNORMAL HIGH (ref 0.0–2.0)
Bicarbonate: 20.9 mmol/L (ref 20.0–28.0)
Bicarbonate: 19.8 mmol/L — ABNORMAL LOW (ref 20.0–28.0)
Calcium, Ion: 0.92 mmol/L — ABNORMAL LOW (ref 1.15–1.40)
Calcium, Ion: 0.9 mmol/L — ABNORMAL LOW (ref 1.15–1.40)
HCT: 27 % — ABNORMAL LOW (ref 39.0–52.0)
HCT: 32 % — ABNORMAL LOW (ref 39.0–52.0)
Hemoglobin: 9.2 g/dL — ABNORMAL LOW (ref 13.0–17.0)
Hemoglobin: 10.9 g/dL — ABNORMAL LOW (ref 13.0–17.0)
O2 Saturation: 99 %
O2 Saturation: 98 %
Patient temperature: 95.9
Patient temperature: 95.9
Potassium: 5.3 mmol/L — ABNORMAL HIGH (ref 3.5–5.1)
Potassium: 5.7 mmol/L — ABNORMAL HIGH (ref 3.5–5.1)
Sodium: 148 mmol/L — ABNORMAL HIGH (ref 135–145)
Sodium: 147 mmol/L — ABNORMAL HIGH (ref 135–145)
TCO2: 23 mmol/L (ref 22–32)
TCO2: 22 mmol/L (ref 22–32)
pCO2 arterial: 56.6 mmHg — ABNORMAL HIGH (ref 32.0–48.0)
pCO2 arterial: 57.1 mmHg — ABNORMAL HIGH (ref 32.0–48.0)
pH, Arterial: 7.167 — CL (ref 7.350–7.450)
pH, Arterial: 7.138 — CL (ref 7.350–7.450)
pO2, Arterial: 159 mmHg — ABNORMAL HIGH (ref 83.0–108.0)
pO2, Arterial: 128 mmHg — ABNORMAL HIGH (ref 83.0–108.0)

## 2020-05-19 LAB — COMPREHENSIVE METABOLIC PANEL
ALT: 1641 U/L — ABNORMAL HIGH (ref 0–44)
AST: 2415 U/L — ABNORMAL HIGH (ref 15–41)
Albumin: 1.1 g/dL — ABNORMAL LOW (ref 3.5–5.0)
Alkaline Phosphatase: 147 U/L — ABNORMAL HIGH (ref 38–126)
Anion gap: 18 — ABNORMAL HIGH (ref 5–15)
BUN: 52 mg/dL — ABNORMAL HIGH (ref 8–23)
CO2: 11 mmol/L — ABNORMAL LOW (ref 22–32)
Calcium: 4.3 mg/dL — CL (ref 8.9–10.3)
Chloride: 120 mmol/L — ABNORMAL HIGH (ref 98–111)
Creatinine, Ser: 2.11 mg/dL — ABNORMAL HIGH (ref 0.61–1.24)
GFR, Estimated: 32 mL/min — ABNORMAL LOW (ref >60)
Glucose, Bld: 104 mg/dL — ABNORMAL HIGH (ref 70–99)
Potassium: 4 mmol/L (ref 3.5–5.1)
Sodium: 149 mmol/L — ABNORMAL HIGH (ref 135–145)
Total Bilirubin: 0.8 mg/dL (ref 0.3–1.2)
Total Protein: <3 g/dL — ABNORMAL LOW (ref 6.5–8.1)

## 2020-05-19 LAB — GLUCOSE, CAPILLARY
Glucose-Capillary: 42 mg/dL — CL (ref 70–99)
Glucose-Capillary: 73 mg/dL (ref 70–99)
Glucose-Capillary: 142 mg/dL — ABNORMAL HIGH (ref 70–99)
Glucose-Capillary: 37 mg/dL — CL (ref 70–99)

## 2020-05-19 LAB — MAGNESIUM: Magnesium: 2 mg/dL (ref 1.7–2.4)

## 2020-05-19 LAB — URINE CULTURE: Culture: NO GROWTH

## 2020-05-19 MED ORDER — FENTANYL CITRATE (PF) 100 MCG/2ML IJ SOLN: 25.0000 ug | INTRAMUSCULAR | Status: DC | PRN

## 2020-05-19 MED ORDER — POLYETHYLENE GLYCOL 3350 17 G PO PACK: 17.0000 g | PACK | Freq: Every day | ORAL | Status: DC | PRN

## 2020-05-19 MED ORDER — STERILE WATER FOR INJECTION IV SOLN
INTRAVENOUS | Status: DC
  Filled 2020-05-19 (×2): qty 850

## 2020-05-19 MED ORDER — ROCURONIUM BROMIDE 10 MG/ML (PF) SYRINGE
PREFILLED_SYRINGE | INTRAVENOUS | Status: AC
  Filled 2020-05-19: qty 10

## 2020-05-19 MED ORDER — INSULIN ASPART 100 UNIT/ML ~~LOC~~ SOLN: 1.0000 [IU] | SUBCUTANEOUS | Status: DC

## 2020-05-19 MED ORDER — MIRTAZAPINE 15 MG PO TABS: 30.0000 mg | ORAL_TABLET | Freq: Every day | ORAL | Status: DC

## 2020-05-19 MED ORDER — DOCUSATE SODIUM 50 MG/5ML PO LIQD: 100.0000 mg | Freq: Every day | ORAL | Status: DC | PRN

## 2020-05-19 MED ORDER — SODIUM BICARBONATE 8.4 % IV SOLN
100.0000 meq | Freq: Once | INTRAVENOUS | Status: AC
  Administered 2020-05-19: 100 meq via INTRAVENOUS
  Filled 2020-05-19: qty 100

## 2020-05-19 MED ORDER — PANTOPRAZOLE SODIUM 40 MG PO PACK
40.0000 mg | PACK | Freq: Every day | ORAL | Status: DC
  Filled 2020-05-19: qty 20

## 2020-05-19 MED ORDER — ETOMIDATE 2 MG/ML IV SOLN
INTRAVENOUS | Status: AC
  Administered 2020-05-19: 20 mg
  Filled 2020-05-19: qty 10

## 2020-05-19 MED ORDER — FENTANYL 2500MCG IN NS 250ML (10MCG/ML) PREMIX INFUSION
25.0000 ug/h | INTRAVENOUS | Status: DC
  Administered 2020-05-19: 100 ug/h via INTRAVENOUS
  Filled 2020-05-19: qty 250

## 2020-05-19 MED ORDER — MIDAZOLAM HCL 2 MG/2ML IJ SOLN
INTRAMUSCULAR | Status: AC
  Filled 2020-05-19: qty 2

## 2020-05-19 MED ORDER — VASOPRESSIN 20 UNITS/100 ML INFUSION FOR SHOCK
0.0000 [IU]/min | INTRAVENOUS | Status: DC
  Administered 2020-05-19: 0.03 [IU]/min via INTRAVENOUS
  Filled 2020-05-19: qty 100

## 2020-05-19 MED ORDER — PREDNISONE 20 MG PO TABS: 40.0000 mg | ORAL_TABLET | Freq: Every day | ORAL | Status: DC

## 2020-05-19 MED ORDER — FENTANYL CITRATE (PF) 100 MCG/2ML IJ SOLN
INTRAMUSCULAR | Status: AC
  Administered 2020-05-19: 100 ug
  Filled 2020-05-19: qty 2

## 2020-05-19 MED ORDER — DEXTROSE 50 % IV SOLN
INTRAVENOUS | Status: AC
  Administered 2020-05-19: 50 mL
  Filled 2020-05-19: qty 50

## 2020-05-19 MED ORDER — MIDAZOLAM HCL 2 MG/2ML IJ SOLN: 1.0000 mg | INTRAMUSCULAR | Status: DC | PRN

## 2020-05-19 MED ORDER — NOREPINEPHRINE 4 MG/250ML-% IV SOLN
0.0000 ug/min | INTRAVENOUS | Status: DC
  Administered 2020-05-19 (×2): 40 ug/min via INTRAVENOUS
  Administered 2020-05-19: 6 ug/min via INTRAVENOUS
  Filled 2020-05-19 (×4): qty 250

## 2020-05-19 MED ORDER — POLYETHYLENE GLYCOL 3350 17 G PO PACK: 17.0000 g | PACK | Freq: Every day | ORAL | Status: DC

## 2020-05-19 MED ORDER — SODIUM BICARBONATE 8.4 % IV SOLN
100.0000 meq | Freq: Once | INTRAVENOUS | Status: AC
  Administered 2020-05-19: 100 meq via INTRAVENOUS

## 2020-05-19 MED ORDER — DOCUSATE SODIUM 50 MG/5ML PO LIQD: 100.0000 mg | Freq: Two times a day (BID) | ORAL | Status: DC

## 2020-05-19 DEATH — deceased

## 2020-05-20 LAB — CULTURE, BLOOD (ROUTINE X 2): Special Requests: ADEQUATE

## 2020-05-22 LAB — CULTURE, BLOOD (ROUTINE X 2): Culture: NO GROWTH

## 2020-06-19 NOTE — Progress Notes (Signed)
Critical ABG results called to Elink and reported to RN  Ph 7.13 CO2 57 PO2 128 HCO3 19

## 2020-06-19 NOTE — Progress Notes (Signed)
OT Cancellation and Discharge Note  Patient Details Name: John Marsh MRN: [id number] DOB: Oct 06, 1945   Cancelled Treatment:    Reason Eval/Treat Not Completed: Medical issues which prohibited therapy;Patient not medically ready. Pt now in ICU and intubated. Please reorder if appropriate.   Evern Bio [name] 05-24-2020, 8:42 AM   Nyoka Cowden OTR/L Acute Rehabilitation Services Pager: [phone] Office: [phone]

## 2020-06-19 NOTE — Progress Notes (Addendum)
eLink Physician-Brief Progress Note Patient Name: DAGOBERTO NEALY DOB: [birth year] MRN: [id number]   Date of Service  06/03/2020  HPI/Events of Note  Multiple issues: 1. ABG on BiPAP = 7.176/71.6/78, 2. Hypotension - BP = 81/28 with MAP = 32 and 3. Bedside nurse requests an A-line.   eICU Interventions  Plan: 1. Norepinephrine IV infusion. Titrate to MAP >= 65. 2. Ground team notified of need for intubation and A-line.  3. NaHCO3 IV now. 4. NaHCO3 IV infusion to run IV at 75 mL/hour.  5. Repeat ABG at 5 AM.     Intervention Category Major Interventions: Respiratory failure - evaluation and management;Acid-Base disturbance - evaluation and management;Hypotension - evaluation and management  Lenell Antu 06/10/2020, 12:26 AM

## 2020-06-19 NOTE — Progress Notes (Signed)
PCCM Interval Progress Note  Pt tiring out on BiPAP, BP worse.  Discussed with wife who agrees to short term intubation to allow time for children to get to the hospital and visit with pt.  Arterial line placed by Dr. Vassie Loll.  Levo added with improvement in BP. Pt intubated.  After intubation, I had an extensive discussion with pt's wife, Brix Brearley. We discussed Kazuma's current circumstances and organ failures. We also discussed patient's prior wishes under circumstances such as this. The family has decided not to perform resuscitation if arrest were to occur, but to otherwise continue with current medical support / therapies.   Rutherford Guys, [place] Sidonie Dickens [facility] 06/10/2020, 1:39 AM

## 2020-06-19 NOTE — Progress Notes (Signed)
eLink Physician-Brief Progress Note Patient Name: John Marsh DOB: 11/22/1945 MRN: [id number]   Date of Service  06/15/2020  HPI/Events of Note  Hypotension - BP = 101/59 and HR = 157. Already on Norepinephrine and Phenylephrine IV infusions at ceiling rates.   eICU Interventions  Plan: 1. Vasopressin IV infusion at shock dose.  2. NaHCO3 100 meq IV now.  3. Increase NaHCO3 IV infusion rate to 100 mL/hour.  4. Repeat ABG already ordered for 7:30 AM.     Intervention Category Major Interventions: Hypotension - evaluation and management  Yen Wandell Eugene 06/11/2020, 5:42 AM

## 2020-06-19 NOTE — Progress Notes (Signed)
Hypoglycemic Event  CBG: 37  Treatment: 50 mL Dextrose  Symptoms: Hypotension, clammy  Follow-up CBG: Time: 0823 CBG Result: 142  Possible Reasons for Event  Comments/MD notified:    Verdie Mosher

## 2020-06-19 NOTE — Progress Notes (Signed)
PT Cancellation/DC Note  Patient Details Name: ELGAR SCOGGINS MRN: [id number] DOB: 02-Mar-1946   Cancelled Treatment:    Reason Eval/Treat Not Completed: Medical issues which prohibited therapy. Pt now in ICU and intubated. Please reorder if appropriate.    Angelina Ok [facility] 05-23-2020, 8:40 AM Skip Mayer PT Acute Rehabilitation Services Pager [phone] Office [phone]

## 2020-06-19 NOTE — Death Summary Note (Addendum)
DEATH SUMMARY   Patient Details  Name: John Marsh MRN: [id number] DOB: 1946-03-28  Admission/Discharge Information   Admit Date:  05-28-2020  Date of Death: Date of Death: 30-May-2020  Time of Death: Time of Death: 28-Oct-1019  Length of Stay: 2  Referring Physician: Eartha Inch, MD   Reason(s) for Hospitalization  Acute on Chronic Respiratory Failure  Diagnoses  Preliminary cause of death:  Secondary Diagnoses (including complications and co-morbidities):  Principal Problem:   Acute on chronic respiratory failure with hypoxia and hypercapnia (HCC) Active Problems:   Diabetes mellitus without complication (HCC)   HLD (hyperlipidemia)   TOBACCO DEPENDENCE   Essential hypertension   CAD (coronary artery disease), native coronary artery   COPD (chronic obstructive pulmonary disease) with emphysema (HCC)   Lobar pneumonia (HCC)   Chronic diastolic HF (heart failure) (HCC)   Respiratory failure (HCC)   Pressure injury of skin   Brief Hospital Course (including significant findings, care, treatment, and services provided and events leading to death)  John Marsh is a 75 y.o. year old male with COPD/emphysema, atrial fibrillation, peripheral vascular disease, and coronary artery disease who presented to the hospital on 05/18/20 via EMS after collapsing at home. He was initially treated with non-invasive ventilation for suspected COPD exacerbation and pneumonia with septic shock on admission with antibiotics. He had abdominal CT Scan which showed non-specific findings of colonic thickening and pericolonic stranding concerning for colitis or watershed ischemia. There was also noted ileus vs small bowel obstruction. He also had elevated lipase concerning for pancreatitis. His condition continued to deteriorate and he required intubation and mechanical ventilation. He had progressive shock that was unresponsive to vasopressor support. He then developed multiorgan failure and died at 10:21am  on 05-30-2020. Family was notified and allowed visitation time at the bedside.   Pertinent Labs and Studies  Significant Diagnostic Studies CT ABDOMEN PELVIS WO CONTRAST  Result Date: 05/18/2020 CLINICAL DATA:  Acute abdominal pain, nonlocalized abdominal pain in a 75 year old male, reportedly concern for pancreatitis EXAM: CT ABDOMEN AND PELVIS WITHOUT CONTRAST TECHNIQUE: Multidetector CT imaging of the abdomen and pelvis was performed following the standard protocol without IV contrast. COMPARISON:  05/03/2017 FINDINGS: Lower chest: Diffuse ill-defined nodular pattern and bronchial wall thickening at the lung bases with more confluent airspace disease at the LEFT lung base in the dependent LEFT chest. No sign of pleural effusion. Hepatobiliary: Liver with mildly lobular contours, stable compared to previous imaging. Post cholecystectomy. No gross biliary duct dilation. Pancreas: Pancreas with normal contour. No stranding in the adjacent fat about the pancreas. Spleen: Normal Adrenals/Urinary Tract: Adrenal glands are normal Smooth renal contours. No hydronephrosis. No nephrolithiasis. No perinephric stranding. No ureteral dilation. Stomach/Bowel: Normal appendix. Mildly dilated scattered fluid and gas-filled loops of bowel in the proximal small bowel. Distal small bowel is decompressed. Maximal caliber of proximal small bowel at 2.5 cm no perienteric stranding. Appendix not visualized but no signs of appendicitis about the cecum. Transition point in the colon also on today's study. Query mild thickening in the area of the distal transverse colon and splenic flexure extending through the descending colon. Findings not definite. No pneumatosis. No free air. Vascular/Lymphatic: Calcified atheromatous plaque of the abdominal aorta. No aneurysmal dilation. There is no gastrohepatic or hepatoduodenal ligament lymphadenopathy. No retroperitoneal or mesenteric lymphadenopathy. There is mild generalized edema  throughout the retroperitoneum however and also potentially about the LEFT colon. Significance of these findings is uncertain, this tracks to the level of the iliac bifurcation,  distant to the pancreas. No pelvic lymphadenopathy. Reproductive: Prostate mildly heterogeneous.  Unremarkable by CT. Other: No ascites. Musculoskeletal: Osteopenia. Spinal degenerative changes. No acute or destructive bone finding IMPRESSION: 1. Diffuse ill-defined nodular pattern and bronchial wall thickening at the lung bases with more confluent airspace disease at the LEFT lung base in the dependent LEFT chest, concerning for multifocal pneumonia perhaps related to aspiration or atypical infection. 2. Question of colonic thickening and mild pericolonic stranding. Could be seen in the setting of mild colitis. Given distribution would consider correlation with lactate to exclude the possibility of watershed ischemia. Appearance is however nonspecific. 3. Retroperitoneal edema, of uncertain significance given the lack of significant stranding about the pancreas. Findings could potentially be related to suspected pancreatitis as laboratory values may often pre see CT findings. Consider follow-up based on clinical symptoms and any other changes that would suggest worsening of potential pancreatitis. 4. Mild ileus versus partial small bowel obstruction as described. 5. Aortic atherosclerosis. Aortic Atherosclerosis (ICD10-I70.0). Electronically Signed   By: Donzetta Kohut M.D.   On: 05/18/2020 16:45   DG Abd 1 View  Result Date: June 05, 2020 CLINICAL DATA:  NG tube placement EXAM: ABDOMEN - 1 VIEW COMPARISON:  Portable exam at 1010 hrs without priors for comparison FINDINGS: Nasogastric tube coiled in mid esophagus at the level of the carina, recommend withdrawal and replacement. COPD changes in the mid to lower lungs bilaterally. Air-filled loops of bowel in the upper abdomen. Bones demineralized. IMPRESSION: Nasogastric tube coiled in mid  esophagus; recommend withdrawal and replacement. COPD changes. Findings called to [facility] in MICU on 06/05/20 at 1026 hours. Electronically Signed   By: Ulyses Southward M.D.   On: 05-Jun-2020 10:27   Portable Chest x-ray  Result Date: 2020-06-05 CLINICAL DATA:  Acute on chronic respiratory failure EXAM: PORTABLE CHEST 1 VIEW COMPARISON:  04/27/2020 FINDINGS: Cardiac shadow is within normal limits. The lungs are hyperinflated consistent with COPD. Mild fibrotic changes are noted stable from the prior exam. No focal infiltrate is seen. Endotracheal tube is noted in satisfactory position. Aortic calcifications are noted. IMPRESSION: COPD with fibrotic changes stable from the previous exam. No new focal abnormality is noted. Electronically Signed   By: Alcide Clever M.D.   On: 2020/06/05 01:51   DG Chest Port 1 View  Result Date: 05/07/2020 CLINICAL DATA:  Cough shortness of breath EXAM: PORTABLE CHEST 1 VIEW COMPARISON:  Chest radiograph October 03, 2017 and chest CT July 24, 2017. FINDINGS: The heart size and mediastinal contours are within normal limits. Atherosclerotic calcifications aorta. Extensive emphysematous change with scattered areas of fibrosis. New hazy left lower lobe predominant interstitial and airspace opacities. The visualized skeletal structures are unremarkable. IMPRESSION: Emphysematous change with scattered areas of fibrosis now with superimposed hazy left lower lobe predominant interstitial and airspace opacities concerning for infection. Electronically Signed   By: Maudry Mayhew MD   On: 05/15/2020 17:43   ECHOCARDIOGRAM LIMITED  Result Date: 05/18/2020    ECHOCARDIOGRAM LIMITED REPORT   Patient Name:   John Marsh Date of Exam: 05/18/2020 Medical Rec #:  [id number]    Height:       72.0 in Accession #:    [id number]   Weight:       120.8 lb Date of Birth:  1945-07-05    BSA:          1.720 m Patient Age:    74 years     BP:  164/107 mmHg Patient Gender: M            HR:            109 bpm. Exam Location:  Inpatient Procedure: Limited Echo Indications:    Dyspnea 786.09 / R06.00  History:        Patient has prior history of Echocardiogram examinations, most                 recent 07/15/2017. CHF, CAD and Previous Myocardial Infarction,                 Arrythmias:Atrial Fibrillation and Atrial Flutter,                 Signs/Symptoms:Chest Pain; Risk Factors:Hypertension and                 Dyslipidemia.  Sonographer:    Elmarie Shiley Dance Referring Phys: [id number] CHI JANE ELLISON  Sonographer Comments: Technically difficult study due to poor echo windows, no parasternal window, no apical window and no subcostal window.  Conclusion(s)/Recommendation(s): Nondiagnostic study. No visualization of cardiac structures in parasternal, apical, SSN, or subcostal views. Jodelle Red MD Electronically signed by Jodelle Red MD Signature Date/Time: 05/18/2020/5:37:12 PM    Final     Microbiology Recent Results (from the past 240 hour(s))  Blood Culture (routine x 2)     Status: Abnormal (Preliminary result)   Collection Time: 2020-06-08  4:43 PM   Specimen: BLOOD  Result Value Ref Range Status   Specimen Description BLOOD SITE NOT SPECIFIED  Final   Special Requests   Final    BOTTLES DRAWN AEROBIC AND ANAEROBIC Blood Culture adequate volume   Culture  Setup Time   Final    ANAEROBIC BOTTLE ONLY GRAM VARIABLE ROD CRITICAL RESULT CALLED TO, READ BACK BY AND VERIFIED WITH: Merla Riches PharmD 8:50 05/18/20 ([email or web address]) AEROBIC BOTTLE ONLY GRAM POSITIVE COCCI IN CHAINS CRITICAL RESULT CALLED TO, READ BACK BY AND VERIFIED WITH: Lytle Butte PharmD 16:55 05/18/20 ([email or web address])    Culture (A)  Final    BACILLUS SPECIES Standardized susceptibility testing for this organism is not available. STREPTOCOCCUS MITIS/ORALIS THE SIGNIFICANCE OF ISOLATING THIS ORGANISM FROM A SINGLE SET OF BLOOD CULTURES WHEN MULTIPLE SETS ARE DRAWN IS UNCERTAIN. PLEASE NOTIFY THE MICROBIOLOGY DEPARTMENT WITHIN  ONE WEEK IF SPECIATION AND SENSITIVITIES ARE REQUIRED. Performed at [facility], [address] [place] [address], [place], [place] [address]    Report Status PENDING  Incomplete  Blood Culture ID Panel (Reflexed)     Status: None   Collection Time: June 08, 2020  4:43 PM  Result Value Ref Range Status   Enterococcus faecalis NOT DETECTED NOT DETECTED Final   Enterococcus Faecium NOT DETECTED NOT DETECTED Final   Listeria monocytogenes NOT DETECTED NOT DETECTED Final   Staphylococcus species NOT DETECTED NOT DETECTED Final   Staphylococcus aureus (BCID) NOT DETECTED NOT DETECTED Final   Staphylococcus epidermidis NOT DETECTED NOT DETECTED Final   Staphylococcus lugdunensis NOT DETECTED NOT DETECTED Final   Streptococcus species NOT DETECTED NOT DETECTED Final   Streptococcus agalactiae NOT DETECTED NOT DETECTED Final   Streptococcus pneumoniae NOT DETECTED NOT DETECTED Final   Streptococcus pyogenes NOT DETECTED NOT DETECTED Final   A.calcoaceticus-baumannii NOT DETECTED NOT DETECTED Final   Bacteroides fragilis NOT DETECTED NOT DETECTED Final   Enterobacterales NOT DETECTED NOT DETECTED Final   Enterobacter cloacae complex NOT DETECTED NOT DETECTED Final   Escherichia coli NOT DETECTED NOT DETECTED Final   Klebsiella aerogenes NOT DETECTED NOT DETECTED  Final   Klebsiella oxytoca NOT DETECTED NOT DETECTED Final   Klebsiella pneumoniae NOT DETECTED NOT DETECTED Final   Proteus species NOT DETECTED NOT DETECTED Final   Salmonella species NOT DETECTED NOT DETECTED Final   Serratia marcescens NOT DETECTED NOT DETECTED Final   Haemophilus influenzae NOT DETECTED NOT DETECTED Final   Neisseria meningitidis NOT DETECTED NOT DETECTED Final   Pseudomonas aeruginosa NOT DETECTED NOT DETECTED Final   Stenotrophomonas maltophilia NOT DETECTED NOT DETECTED Final   Candida albicans NOT DETECTED NOT DETECTED Final   Candida auris NOT DETECTED NOT DETECTED Final   Candida glabrata NOT DETECTED NOT DETECTED Final    Candida krusei NOT DETECTED NOT DETECTED Final   Candida parapsilosis NOT DETECTED NOT DETECTED Final   Candida tropicalis NOT DETECTED NOT DETECTED Final   Cryptococcus neoformans/gattii NOT DETECTED NOT DETECTED Final    Comment: Performed at [facility], [address] [place], [place] [address]  Blood Culture ID Panel (Reflexed)     Status: Abnormal   Collection Time: Jun 03, 2020  4:43 PM  Result Value Ref Range Status   Enterococcus faecalis NOT DETECTED NOT DETECTED Final   Enterococcus Faecium NOT DETECTED NOT DETECTED Final   Listeria monocytogenes NOT DETECTED NOT DETECTED Final   Staphylococcus species NOT DETECTED NOT DETECTED Final   Staphylococcus aureus (BCID) NOT DETECTED NOT DETECTED Final   Staphylococcus epidermidis NOT DETECTED NOT DETECTED Final   Staphylococcus lugdunensis NOT DETECTED NOT DETECTED Final   Streptococcus species DETECTED (A) NOT DETECTED Final    Comment: Not Enterococcus species, Streptococcus agalactiae, Streptococcus pyogenes, or Streptococcus pneumoniae. CRITICAL RESULT CALLED TO, READ BACK BY AND VERIFIED WITH: Lytle Butte PharmD 16:55 05/18/20 ([email or web address])    Streptococcus agalactiae NOT DETECTED NOT DETECTED Final   Streptococcus pneumoniae NOT DETECTED NOT DETECTED Final   Streptococcus pyogenes NOT DETECTED NOT DETECTED Final   A.calcoaceticus-baumannii NOT DETECTED NOT DETECTED Final   Bacteroides fragilis NOT DETECTED NOT DETECTED Final   Enterobacterales NOT DETECTED NOT DETECTED Final   Enterobacter cloacae complex NOT DETECTED NOT DETECTED Final   Escherichia coli NOT DETECTED NOT DETECTED Final   Klebsiella aerogenes NOT DETECTED NOT DETECTED Final   Klebsiella oxytoca NOT DETECTED NOT DETECTED Final   Klebsiella pneumoniae NOT DETECTED NOT DETECTED Final   Proteus species NOT DETECTED NOT DETECTED Final   Salmonella species NOT DETECTED NOT DETECTED Final   Serratia marcescens NOT DETECTED NOT DETECTED Final   Haemophilus  influenzae NOT DETECTED NOT DETECTED Final   Neisseria meningitidis NOT DETECTED NOT DETECTED Final   Pseudomonas aeruginosa NOT DETECTED NOT DETECTED Final   Stenotrophomonas maltophilia NOT DETECTED NOT DETECTED Final   Candida albicans NOT DETECTED NOT DETECTED Final   Candida auris NOT DETECTED NOT DETECTED Final   Candida glabrata NOT DETECTED NOT DETECTED Final   Candida krusei NOT DETECTED NOT DETECTED Final   Candida parapsilosis NOT DETECTED NOT DETECTED Final   Candida tropicalis NOT DETECTED NOT DETECTED Final   Cryptococcus neoformans/gattii NOT DETECTED NOT DETECTED Final    Comment: Performed at [facility], [address] [place], [place] [address]  Resp Panel by RT-PCR (Flu A&B, Covid) Nasopharyngeal Swab     Status: None   Collection Time: 2020/06/03  4:49 PM   Specimen: Nasopharyngeal Swab; Nasopharyngeal(NP) swabs in vial transport medium  Result Value Ref Range Status   SARS Coronavirus 2 by RT PCR NEGATIVE NEGATIVE Final    Comment: (NOTE) SARS-CoV-2 target nucleic acids are NOT DETECTED.  The SARS-CoV-2 RNA is generally detectable in upper respiratory specimens during the acute phase of infection. The lowest concentration of SARS-CoV-2 viral copies this assay can detect is 138 copies/mL. A negative result does not preclude SARS-Cov-2 infection and should not be used as the sole basis for treatment or other patient management decisions. A negative result may occur with  improper specimen collection/handling, submission of specimen other than nasopharyngeal swab, presence of viral mutation(s) within the areas targeted by this assay, and inadequate number of viral copies(<138 copies/mL). A negative result must be combined with clinical observations, patient history, and epidemiological information. The expected result is Negative.  Fact Sheet for Patients:  BloggerCourse.com  Fact Sheet for Healthcare Providers:   SeriousBroker.it  This test is no t yet approved or cleared by the [place] FDA and  has been authorized for detection and/or diagnosis of SARS-CoV-2 by FDA under an Emergency Use Authorization (EUA). This EUA will remain  in effect (meaning this test can be used) for the duration of the COVID-19 declaration under Section 564(b)(1) of the Act, 21 U.S.C.section 360bbb-3(b)(1), unless the authorization is terminated  or revoked sooner.       Influenza A by PCR NEGATIVE NEGATIVE Final   Influenza B by PCR NEGATIVE NEGATIVE Final    Comment: (NOTE) The Xpert Xpress SARS-CoV-2/FLU/RSV plus assay is intended as an aid in the diagnosis of influenza from Nasopharyngeal swab specimens and should not be used as a sole basis for treatment. Nasal washings and aspirates are unacceptable for Xpert Xpress SARS-CoV-2/FLU/RSV testing.  Fact Sheet for Patients: BloggerCourse.com  Fact Sheet for Healthcare Providers: SeriousBroker.it  This test is not yet approved or cleared by the [place] FDA and has been authorized for detection and/or diagnosis of SARS-CoV-2 by FDA under an Emergency Use Authorization (EUA). This EUA will remain in effect (meaning this test can be used) for the duration of the COVID-19 declaration under Section 564(b)(1) of the Act, 21 U.S.C. section 360bbb-3(b)(1), unless the authorization is terminated or revoked.  Performed at [facility], [address], [place], [place] [address]   Blood Culture (routine x 2)     Status: None (Preliminary result)   Collection Time: 05/07/2020  5:43 PM   Specimen: BLOOD  Result Value Ref Range Status   Specimen Description BLOOD SITE NOT SPECIFIED  Final   Special Requests   Final    BOTTLES DRAWN AEROBIC AND ANAEROBIC Blood Culture results may not be optimal due to an inadequate volume of blood received in culture bottles   Culture   Final     NO GROWTH 2 DAYS Performed at [facility], [address] [place], [place] [address]    Report Status PENDING  Incomplete  MRSA PCR Screening     Status: None   Collection Time: 05/18/20 10:12 AM   Specimen: Nasal Mucosa; Nasopharyngeal  Result Value Ref Range Status   MRSA by PCR NEGATIVE NEGATIVE Final    Comment:        The GeneXpert MRSA Assay (FDA approved for NASAL specimens only), is one component of a comprehensive MRSA colonization surveillance program. It is not intended to diagnose MRSA infection nor to guide or monitor treatment for MRSA infections. Performed at [facility], [address] [place], [place] [address]   Urine culture     Status: None   Collection Time: 05/18/20  6:17 PM   Specimen: Urine, Random  Result Value Ref Range Status  Specimen Description URINE, RANDOM  Final   Special Requests NONE  Final   Culture   Final    NO GROWTH Performed at [facility], [address], [place], [place] [address]    Report Status 05/30/2020 FINAL  Final    Lab Basic Metabolic Panel: Recent Labs  Lab May 27, 2020 1633 2020-05-27 1633 05-27-2020 1659 05-27-2020 1742 05/18/20 0259 05/18/20 0744 05/18/20 0953 05/18/20 1526 05/18/20 1850 05/18/20 2356 06/14/2020 0235 05/27/2020 0417 06/03/2020 0841  NA 142   < > 138   < > 138   < > 145   < > 144 148* 148* 147* 149*  K 4.1   < > 4.1   < > 4.3   < > 4.0   < > 4.6 5.7* 5.3* 5.7* 4.0  CL 98   < > 102  --  100  --  105  --  106  --   --   --  120*  CO2 24  --   --   --  23  --  21*  --  17*  --   --   --  11*  GLUCOSE 183*   < > 177*  --  102*  --  101*  --  100*  --   --   --  104*  BUN 42*   < > 47*  --  58*  --  70*  --  82*  --   --   --  52*  CREATININE 1.57*   < > 1.50*  --  2.02*  --  2.23*  --  2.55*  --   --   --  2.11*  CALCIUM 9.6  --   --   --  8.4*  --  8.4*  --  8.1*  --   --   --  4.3*  MG  --   --   --   --   --   --  3.5*  --   --   --   --   --  2.0  PHOS  --   --   --   --   --    --  5.1*  --   --   --   --   --   --    < > = values in this interval not displayed.   Liver Function Tests: Recent Labs  Lab 27-May-2020 1633 05/18/20 0259 05/18/20 0953 06/13/2020 0841  AST 84* 1,027* 1,342* 2,415*  ALT 84* 1,036* 1,150* 1,641*  ALKPHOS 111 158* 242* 147*  BILITOT 0.8 1.9* 2.1* 0.8  PROT 7.0 6.2* 5.8* <3.0*  ALBUMIN 3.4* 3.1* 3.0* 1.1*   Recent Labs  Lab 05/18/20 0953  LIPASE 290*  AMYLASE 331*   No results for input(s): AMMONIA in the last 168 hours. CBC: Recent Labs  Lab 2020/05/27 1633 05/27/20 1659 05/18/20 0953 05/18/20 1526 05/18/20 2356 06/02/2020 0235 06/12/2020 0417  WBC 8.0  --  5.7  --   --   --   --   NEUTROABS 7.2  --   --   --   --   --   --   HGB 14.0   < > 13.6 12.6* 8.8* 9.2* 10.9*  HCT 46.6   < > 43.2 37.0* 26.0* 27.0* 32.0*  MCV 93.2  --  90.6  --   --   --   --   PLT 317  --  199  --   --   --   --    < > =  values in this interval not displayed.   Cardiac Enzymes: No results for input(s): CKTOTAL, CKMB, CKMBINDEX, TROPONINI in the last 168 hours. Sepsis Labs: Recent Labs  Lab 05/12/2020 1633 05/15/2020 1643 05/18/20 0336 05/18/20 0953 05/18/20 0956 05/18/20 1850  PROCALCITON  --   --   --  7.55  --   --   WBC 8.0  --   --  5.7  --   --   LATICACIDVEN  --  7.1* 3.8*  --  2.9* 3.0*    Procedures/Operations  Endotracheal Intubation Arterial Line placement [employer] Line Placement   Martina SinnerJonathan B Niemah Schwebke 06/06/2020, 6:19 PM

## 2020-06-19 NOTE — Procedures (Signed)
Intubation Procedure Note  John Marsh  [id number]  March 16, 1946  Date:06/12/2020  Time:1:30 AM   Provider Performing:Kaylon Hitz V. Trafton Roker    Procedure: Intubation (31500)  Indication(s) Respiratory Failure  Consent Risks of the procedure as well as the alternatives and risks of each were explained to the patient and/or caregiver.  Consent for the procedure was obtained and is signed in the bedside chart   Anesthesia Etomidate and Fentanyl   Time Out Verified patient identification, verified procedure, site/side was marked, verified correct patient position, special equipment/implants available, medications/allergies/relevant history reviewed, required imaging and test results available.   Sterile Technique Usual hand hygeine, masks, and gloves were used   Fent 50 Etomidate 20  Procedure Description Patient positioned in bed supine.  Sedation given as noted above.  Patient was intubated with endotracheal tube using Glidescope.  View was Grade 1 full glottis .  Number of attempts was 1.  Colorimetric CO2 detector was consistent with tracheal placement.   Complications/Tolerance None; patient tolerated the procedure well. Chest X-ray is ordered to verify placement.   EBL Minimal   Specimen(s) None  Cambryn Charters V. Vassie Loll MD

## 2020-06-19 NOTE — Procedures (Signed)
Arterial Catheter Insertion Procedure Note  John Marsh  [id number]  03/15/1946  Date:05/20/2020  Time:1:28 AM    Provider Performing: John Marsh    Procedure: Insertion of Arterial Line (36468) without US guidance  Indication(s) Blood pressure monitoring and/or need for frequent ABGs  Consent Risks of the procedure as well as the alternatives and risks of each were explained to the patient and/or caregiver.  Consent for the procedure was obtained and is signed in the bedside chart  Anesthesia  1% lido   Time Out Verified patient identification, verified procedure, site/side was marked, verified correct patient position, special equipment/implants available, medications/allergies/relevant history reviewed, required imaging and test results available.   Sterile Technique Maximal sterile technique including full sterile barrier drape, hand hygiene, sterile gown, sterile gloves, mask, hair covering, sterile ultrasound probe cover (if used).   Procedure Description Area of catheter insertion was cleaned with chlorhexidine and draped in sterile fashion. Without real-time ultrasound guidance an arterial catheter was placed into the left femoral artery.  Appropriate arterial tracings confirmed on monitor.     Complications/Tolerance None; patient tolerated the procedure well.   EBL Minimal   Specimen(s) None  John Marsh V. Vassie Loll MD

## 2020-06-19 NOTE — Progress Notes (Addendum)
eLink Physician-Brief Progress Note Patient Name: John Marsh DOB: 09-25-1945 MRN: [id number]   Date of Service  06/15/2020  HPI/Events of Note  ABG on 50%/PRVC 20/TV 560/P 5 = 7.138/57.1/128.   eICU Interventions  Plan: 1. Increase PRVC rate to 31. 2. NaHCO3 IV now.  3. Continue NaHCO3 IV infusion. 4. Repeat ABG at 7:30 AM.     Intervention Category Major Interventions: Acid-Base disturbance - evaluation and management;Respiratory failure - evaluation and management  [name] Dennard Nip June 15, 2020, 4:45 AM

## 2020-06-19 NOTE — Progress Notes (Signed)
PCCM Update: Patient continued to decline this morning from worsening shock, respiratory failure and multiorgan failure.   I was called to the bedside as telemetry showed asystole. On exam the patient did not have any spontaneous movements. No heart sounds were auscultated. No pupillary reflex to light. Patient was pronounced dead at 10:21am.   Melody Comas, MD [facility] Office: [phone]   See Amion for Pager Details

## 2020-06-19 DEATH — deceased
